# Patient Record
Sex: Female | Born: 1935
Health system: Southern US, Community
[De-identification: ages and names within clinical notes are randomized; demographics above are authoritative.]

## PROBLEM LIST (undated history)

## (undated) DIAGNOSIS — S76311A Strain of muscle, fascia and tendon of the posterior muscle group at thigh level, right thigh, initial encounter: Principal | ICD-10-CM

## (undated) DIAGNOSIS — F32A Depression, unspecified: Secondary | ICD-10-CM

## (undated) DIAGNOSIS — I739 Peripheral vascular disease, unspecified: Secondary | ICD-10-CM

## (undated) DIAGNOSIS — J449 Chronic obstructive pulmonary disease, unspecified: Secondary | ICD-10-CM

## (undated) DIAGNOSIS — I350 Nonrheumatic aortic (valve) stenosis: Secondary | ICD-10-CM

## (undated) DIAGNOSIS — F419 Anxiety disorder, unspecified: Secondary | ICD-10-CM

## (undated) DIAGNOSIS — F0280 Dementia in other diseases classified elsewhere without behavioral disturbance: Secondary | ICD-10-CM

## (undated) DIAGNOSIS — E039 Hypothyroidism, unspecified: Secondary | ICD-10-CM

## (undated) DIAGNOSIS — I10 Essential (primary) hypertension: Secondary | ICD-10-CM

## (undated) DIAGNOSIS — E669 Obesity, unspecified: Secondary | ICD-10-CM

## (undated) DIAGNOSIS — Z8719 Personal history of other diseases of the digestive system: Secondary | ICD-10-CM

## (undated) DIAGNOSIS — M1990 Unspecified osteoarthritis, unspecified site: Secondary | ICD-10-CM

## (undated) DIAGNOSIS — M549 Dorsalgia, unspecified: Secondary | ICD-10-CM

## (undated) DIAGNOSIS — G4730 Sleep apnea, unspecified: Secondary | ICD-10-CM

## (undated) DIAGNOSIS — J189 Pneumonia, unspecified organism: Secondary | ICD-10-CM

## (undated) DIAGNOSIS — G309 Alzheimer's disease, unspecified: Secondary | ICD-10-CM

## (undated) DIAGNOSIS — Z9981 Dependence on supplemental oxygen: Secondary | ICD-10-CM

## (undated) DIAGNOSIS — C50919 Malignant neoplasm of unspecified site of unspecified female breast: Secondary | ICD-10-CM

## (undated) DIAGNOSIS — K219 Gastro-esophageal reflux disease without esophagitis: Secondary | ICD-10-CM

## (undated) DIAGNOSIS — F329 Major depressive disorder, single episode, unspecified: Secondary | ICD-10-CM

## (undated) DIAGNOSIS — R591 Generalized enlarged lymph nodes: Secondary | ICD-10-CM

## (undated) HISTORY — DX: Dementia in other diseases classified elsewhere without behavioral disturbance: F02.80

## (undated) HISTORY — DX: Dorsalgia, unspecified: M54.9

## (undated) HISTORY — DX: Chronic obstructive pulmonary disease, unspecified: J44.9

## (undated) HISTORY — DX: Anxiety disorder, unspecified: F41.9

## (undated) HISTORY — PX: BREAST RECONSTRUCTION: SHX9

## (undated) HISTORY — DX: Strain of muscle, fascia and tendon of the posterior muscle group at thigh level, right thigh, initial encounter: S76.311A

## (undated) HISTORY — DX: Malignant neoplasm of unspecified site of unspecified female breast: C50.919

## (undated) HISTORY — PX: CARDIAC CATHETERIZATION: SHX172

## (undated) HISTORY — DX: Generalized enlarged lymph nodes: R59.1

## (undated) HISTORY — DX: Alzheimer's disease, unspecified: G30.9

## (undated) HISTORY — DX: Obesity, unspecified: E66.9

## (undated) HISTORY — DX: Hypothyroidism, unspecified: E03.9

## (undated) HISTORY — DX: Essential (primary) hypertension: I10

## (undated) HISTORY — DX: Nonrheumatic aortic (valve) stenosis: I35.0

## (undated) HISTORY — DX: Dependence on supplemental oxygen: Z99.81

## (undated) HISTORY — DX: Peripheral vascular disease, unspecified: I73.9

## (undated) HISTORY — PX: APPENDECTOMY: SHX54

---
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
## 1978-01-06 HISTORY — PX: MASTECTOMY: SHX3

## 2002-03-24 ENCOUNTER — Ambulatory Visit (HOSPITAL_COMMUNITY): Admission: RE | Admit: 2002-03-24 | Discharge: 2002-03-24 | Payer: Self-pay | Admitting: Gastroenterology

## 2002-03-24 ENCOUNTER — Encounter: Payer: Self-pay | Admitting: Gastroenterology

## 2002-09-14 ENCOUNTER — Other Ambulatory Visit: Admission: RE | Admit: 2002-09-14 | Discharge: 2002-09-14 | Payer: Self-pay | Admitting: Family Medicine

## 2003-03-06 ENCOUNTER — Ambulatory Visit (HOSPITAL_COMMUNITY): Admission: RE | Admit: 2003-03-06 | Discharge: 2003-03-06 | Payer: Self-pay | Admitting: Cardiology

## 2003-10-06 ENCOUNTER — Ambulatory Visit: Payer: Self-pay | Admitting: Psychiatry

## 2003-10-17 ENCOUNTER — Ambulatory Visit: Payer: Self-pay | Admitting: Psychiatry

## 2003-11-24 ENCOUNTER — Ambulatory Visit: Payer: Self-pay | Admitting: Psychiatry

## 2004-01-16 ENCOUNTER — Ambulatory Visit: Payer: Self-pay | Admitting: Psychiatry

## 2004-02-07 ENCOUNTER — Ambulatory Visit: Payer: Self-pay | Admitting: Gastroenterology

## 2004-02-20 ENCOUNTER — Ambulatory Visit: Payer: Self-pay | Admitting: Psychiatry

## 2005-04-10 ENCOUNTER — Ambulatory Visit: Payer: Self-pay | Admitting: Pulmonary Disease

## 2005-04-23 ENCOUNTER — Ambulatory Visit: Payer: Self-pay | Admitting: Internal Medicine

## 2005-05-07 ENCOUNTER — Ambulatory Visit: Payer: Self-pay | Admitting: Internal Medicine

## 2005-06-16 ENCOUNTER — Ambulatory Visit: Payer: Self-pay | Admitting: Internal Medicine

## 2005-07-30 ENCOUNTER — Ambulatory Visit: Payer: Self-pay | Admitting: Internal Medicine

## 2005-09-29 ENCOUNTER — Ambulatory Visit: Payer: Self-pay | Admitting: Internal Medicine

## 2007-04-29 ENCOUNTER — Ambulatory Visit: Payer: Self-pay | Admitting: Vascular Surgery

## 2007-05-18 ENCOUNTER — Telehealth (INDEPENDENT_AMBULATORY_CARE_PROVIDER_SITE_OTHER): Payer: Self-pay | Admitting: *Deleted

## 2007-05-25 ENCOUNTER — Encounter: Payer: Self-pay | Admitting: Internal Medicine

## 2007-05-25 DIAGNOSIS — J449 Chronic obstructive pulmonary disease, unspecified: Secondary | ICD-10-CM

## 2007-05-26 ENCOUNTER — Ambulatory Visit: Payer: Self-pay | Admitting: Internal Medicine

## 2007-06-06 DIAGNOSIS — K219 Gastro-esophageal reflux disease without esophagitis: Secondary | ICD-10-CM | POA: Insufficient documentation

## 2007-06-11 ENCOUNTER — Ambulatory Visit: Payer: Self-pay | Admitting: Internal Medicine

## 2007-06-18 ENCOUNTER — Telehealth: Payer: Self-pay | Admitting: Internal Medicine

## 2007-06-21 ENCOUNTER — Telehealth (INDEPENDENT_AMBULATORY_CARE_PROVIDER_SITE_OTHER): Payer: Self-pay | Admitting: *Deleted

## 2007-06-21 ENCOUNTER — Telehealth: Payer: Self-pay | Admitting: Internal Medicine

## 2007-06-25 ENCOUNTER — Ambulatory Visit: Payer: Self-pay | Admitting: Internal Medicine

## 2007-07-03 DIAGNOSIS — J310 Chronic rhinitis: Secondary | ICD-10-CM | POA: Insufficient documentation

## 2007-07-28 ENCOUNTER — Encounter: Payer: Self-pay | Admitting: Internal Medicine

## 2007-07-29 ENCOUNTER — Encounter: Payer: Self-pay | Admitting: Internal Medicine

## 2007-08-02 ENCOUNTER — Encounter: Payer: Self-pay | Admitting: Internal Medicine

## 2007-09-27 ENCOUNTER — Telehealth (INDEPENDENT_AMBULATORY_CARE_PROVIDER_SITE_OTHER): Payer: Self-pay | Admitting: *Deleted

## 2007-12-01 ENCOUNTER — Ambulatory Visit: Payer: Self-pay | Admitting: Internal Medicine

## 2007-12-06 ENCOUNTER — Telehealth (INDEPENDENT_AMBULATORY_CARE_PROVIDER_SITE_OTHER): Payer: Self-pay | Admitting: *Deleted

## 2008-01-11 ENCOUNTER — Encounter
Admission: RE | Admit: 2008-01-11 | Discharge: 2008-04-10 | Payer: Self-pay | Admitting: Physical Medicine and Rehabilitation

## 2008-03-01 ENCOUNTER — Telehealth (INDEPENDENT_AMBULATORY_CARE_PROVIDER_SITE_OTHER): Payer: Self-pay | Admitting: *Deleted

## 2008-09-25 ENCOUNTER — Encounter (INDEPENDENT_AMBULATORY_CARE_PROVIDER_SITE_OTHER): Payer: Self-pay | Admitting: *Deleted

## 2008-12-14 ENCOUNTER — Ambulatory Visit: Payer: Self-pay | Admitting: Internal Medicine

## 2009-05-18 ENCOUNTER — Telehealth (INDEPENDENT_AMBULATORY_CARE_PROVIDER_SITE_OTHER): Payer: Self-pay | Admitting: *Deleted

## 2009-10-02 ENCOUNTER — Ambulatory Visit: Payer: Self-pay | Admitting: Cardiology

## 2009-12-06 ENCOUNTER — Ambulatory Visit: Payer: Self-pay | Admitting: Cardiology

## 2009-12-14 ENCOUNTER — Ambulatory Visit: Payer: Self-pay | Admitting: Internal Medicine

## 2010-01-10 ENCOUNTER — Telehealth: Payer: Self-pay | Admitting: Internal Medicine

## 2010-02-05 NOTE — Progress Notes (Signed)
Summary: Schedule recall colonoscopy  Phone Note Outgoing Call Call back at [facility] Phone [phone]   Call placed by: Christie Nottingham CMA [facility]),  May 18, 2009 4:06 PM Call placed to: Patient Summary of Call: Called pt to schedule recall colonoscopy and pt states she is sick right now and will call back to schedule at another time. I tried to tell her of the importance of scheduling this procedure and during that sentence she cut me off to say I will just call back.  Initial call taken by: Christie Nottingham CMA [facility]),  May 18, 2009 4:08 PM

## 2010-02-07 NOTE — Progress Notes (Signed)
Summary: order for humidifier  Phone Note Call from Patient Call back at Home Phone [phone]   Caller: Patient Call For: young Summary of Call: Pt states she had a bad nosebleed last night and had to call 911, says the paramedics suggested she get a humidifier re: the air in her home is very dry, needs order called in to [place] in [place]. Initial call taken by: Darletta Moll,  January 10, 2010 9:37 AM  Follow-up for Phone Call        Pt c/o nosebleed last night approx 8pm and was evaluated and treated by EMS who applied cold compresses with "a lot" of pressure and stopped the bleeding. Was told her nose is too dry and suggested to have a humidifier added to O2. Pt states she has used "Roezit" in the past but has stopped because she didn't think she needed it. Pt uses [employer]. Please advise. Thanks. Zackery Barefoot CMA  January 10, 2010 10:23 AM   Additional Follow-up for Phone Call Additional follow up Details #1::        OK Additional Follow-up by: Waymon Budge MD,  January 10, 2010 5:32 PM    Additional Follow-up for Phone Call Additional follow up Details #2::    Order faxed to [facility] to add humidifier to concentrator. Called and spoke with pt and pt is aware of order being sent to DME. Rhonda Cobb  January 11, 2010 8:45 AM Pt advised to call us if she has any additional problems or concerns. Rhonda Cobb  January 11, 2010 8:45 AM   New/Updated Medications: * HUMIDIFIER FOR OXYGEN CONCENTRATOR  Prescriptions: HUMIDIFIER FOR OXYGEN CONCENTRATOR   #1 x prn   Entered and Authorized by:   Waymon Budge MD   Signed by:   Waymon Budge MD on 01/10/2010   Method used:   Print then Give to Patient   RxID:   [id number]

## 2010-02-07 NOTE — Assessment & Plan Note (Signed)
Summary: 12 months/apc   Primary Provider/Referring Provider:  Vernon Prey  CC:  Yearly follow up visit-COPD and allergic rhinitis. SOB with increased activity; able to go hours at a time without O2 if low activity levels..  History of Present Illness:  11/30/07- Allergic rhinitis, copd Severe obstructive disease with FEV1/FVC 0.41, response to bronchodilator. Sleeps much better now using O2 . Hx Pneumovax x 2. Discussed POS Allergy skin tests from last visit Had flu vax. She asks about recommending her for psychiatrist for anxiety- defer to primary.  December 14, 2008- Allergic rhinitis, COPD......................................Marland Kitchenhusband CXR last year showed severe COPD, probable PHTN as discussed. She paces herself. Has gone to therapist for her anxiety. She takes Xanax 0.5  for sleep and occasionally as needed. Occasionally chokes if she talks with eating. Keeps oxygen on 2L. She asks script labled "albuterol" from [place]. Does much better in current cool weather and can take oxygen off in home.  She asks "albuterol" inhlaler script to get from [place]. Had flu vax  December 14, 2009  Allergic rhinitis, COPD......................................Marland Kitchenhusband Nurse-CC: Yearly follow up visit-COPD and allergic rhinitis. SOB with increased activity; able to go hours at a time without O2 if low activity levels. Sometimes feels she can get off O2 briefly. Uses Xanax for anxiety- goes to a counselor. Coughs frequently- dry. Breathing doesn't wake her. O2 2 L cont and portable- depends on it. Discussed O2 use.  Discussed exercise.     Preventive Screening-Counseling & Management  Alcohol-Tobacco     Alcohol drinks/day: 2     Smoking Status: quit     Year Quit: 15 yrs ago     Pack years: smoked 40 yrs 1 pk a day  Current Medications (verified): 1)  Duoneb 0.5-2.5 (3) Mg/73ml  Soln (Ipratropium-Albuterol) .... Four Times A Day 2)  Proair Hfa 108 (90 Base) Mcg/act  Aers (Albuterol Sulfate)  .... 2 Puffs Four Times A Day As Needed 3)  Mucinex Dm 30-600 Mg  Tb12 (Dextromethorphan-Guaifenesin) .... As Needed 4)  Pulmicort 0.25 Mg/62ml  Susp (Budesonide) .... As Needed 5)  Ipratropium Bromide 0.03 %  Soln (Ipratropium Bromide) .... As Needed 6)  Ocean Nasal Spray 0.65 %  Soln (Saline) .... As Needed 7)  Tricor 145 Mg  Tabs (Fenofibrate) .... Take 1 By Mouth Once Daily 8)  Diovan 80 Mg  Tabs (Valsartan) .... Take 1 By Mouth Once Daily 9)  Zantac 75 75 Mg  Tabs (Ranitidine Hcl) .... Take 1 By Mouth Once Daily 10)  Hydrochlorothiazide 12.5 Mg  Tabs (Hydrochlorothiazide) .... Take 1 By Mouth Once Daily 11)  Wellbutrin Xl 150 Mg  Tb24 (Bupropion Hcl) .... Take 2 By Mouth Once Daily 12)  Alprazolam 0.5 Mg  Tabs (Alprazolam) .... Take 1 Every 8 Hours As Needed 13)  Temazepam 30 Mg  Caps (Temazepam) .... Take 1 At Bedtime As Needed 14)  Metoprolol Tartrate 25 Mg  Tabs (Metoprolol Tartrate) .... 1/2 Tab By Mouth Two Times A Day 15)  Tylenol Arthritis Pain 650 Mg  Tbcr (Acetaminophen) .... As Needed 16)  Nasonex 50 Mcg/act Susp (Mometasone Furoate) .Marland Kitchen.. 1-2 Sprays Each Nostril Daily 17)  Oxygen 2 L/m Contin&portable .... [facility] 18)  Albuterol Mdi .... 2 Puffs Four Times A Day Prn 19)  Aerochamber Spacer  Allergies (verified): No Known Drug Allergies  Past History:  Past Medical History: Last updated: 12/01/2007 C O P D G E R D Hx breast cancer Allergic Rhinitis- Skin test pos. 06/25/07 Hx pleurisy  Past Surgical History: Last updated: June 13, 2007 Bilateral mastectomy  Family History: Last updated: 06/13/2007 father died of lung cancer aunts died of female cancer brothers 69 and 34 have hypertension  Social History: Last updated: 06-13-07 married has 3 kids Patient states former smoker.   Risk Factors: Alcohol Use: 2 (12/14/2009)  Risk Factors: Smoking Status: quit (12/14/2009)  Review of Systems      See HPI       The patient complains of shortness  of breath with activity.  The patient denies shortness of breath at rest, productive cough, non-productive cough, coughing up blood, chest pain, irregular heartbeats, acid heartburn, indigestion, loss of appetite, weight change, abdominal pain, difficulty swallowing, sore throat, tooth/dental problems, headaches, nasal congestion/difficulty breathing through nose, sneezing, itching, ear ache, hand/feet swelling, rash, change in color of mucus, and fever.    Vital Signs:  Patient profile:   77 year old female Height:      66 inches Weight:      191.50 pounds BMI:     31.02 O2 Sat:      94 % on 2 L/min Pulse rate:   90 / minute BP sitting:   124 / 80  (left arm) Cuff size:   regular  Vitals Entered By: Reynaldo Minium CMA (December 14, 2009 1:46 PM)  O2 Flow:  2 L/min CC: Yearly follow up visit-COPD and allergic rhinitis. SOB with increased activity; able to go hours at a time without O2 if low activity levels.   Physical Exam  Additional Exam:  General: A/Ox3; pleasant and cooperative, NAD, oxygen- sat initially only 94%/ 2L on arrival. SKIN: no rash, lesions NODES: no lymphadenopathy HEENT: [place]/AT, EOM- WNL, Conjuctivae- clear, PERRLA, TM-WNL, Nose- clear, Throat- clear and wnl NECK: Supple w/ fair ROM, JVD- none, normal carotid impulses w/o bruits Thyroid- CHEST: Clear to P&A, very distant, some pursed lips HEART: RRR, no m/g/r heard ABDOMEN: Soft and nl WNU:UVOZ, nl pulses, no edema  NEURO: Grossly intact to observation      Impression & Recommendations:  Problem # 1:  C O P D (ICD-496) Severe COPD with oxygen dependent chronic respiratory failure. Overall she is not changing much. She is resistant to my discussion about  walking, saying she does enough walking in her mobile home. I tried to explain need for regular exercise and introduced availability of Pulmonary Rehab. Weight loss would help exercise tolerance considerably as reviewed.  Denies needs for med refills now.    Did get flu shot. Discussed her anxiolytic therapy. Discussed reflux control.  Problem # 2:  ALLERGIC RHINITIS (ICD-477.9) Noted some drying from oxygen but not significant atopic complaints now.  Her updated medication list for this problem includes:    Ipratropium Bromide 0.03 % Soln (Ipratropium bromide) .Marland Kitchen... As needed    Ocean Nasal Spray 0.65 % Soln (Saline) .Marland Kitchen... As needed    Nasonex 50 Mcg/act Susp (Mometasone furoate) .Marland Kitchen... 1-2 sprays each nostril daily  Medications Added to Medication List This Visit: 1)  Wellbutrin Xl 150 Mg Tb24 (Bupropion hcl) .... Take 2 by mouth once daily  Other Orders: Est. Patient Level IV (36644) T-2 View CXR (71020TC)  Patient Instructions: 1)  Please schedule a follow-up appointment in 1 year. Please call sooner as needed 2)  A chest x-ray has been recommended.  Your imaging study may require preauthorization.  3)  Consider a saline nasal spray for your nose as often as you need. You might also try the Breath Right nasal strips.

## 2010-02-13 ENCOUNTER — Telehealth (INDEPENDENT_AMBULATORY_CARE_PROVIDER_SITE_OTHER): Payer: Self-pay | Admitting: *Deleted

## 2010-02-19 ENCOUNTER — Encounter: Payer: Self-pay | Admitting: Internal Medicine

## 2010-02-21 ENCOUNTER — Encounter: Payer: Self-pay | Admitting: Internal Medicine

## 2010-02-21 NOTE — Progress Notes (Signed)
Summary: RX  Phone Note Call from Patient Call back at Home Phone [phone]   Caller: Patient Call For: YOUNG Summary of Call: WANTS RX FOR BREATHING EQUIPMENT WRITTEN [place] [facility] ISNT WORKING WELL  Initial call taken by: Lacinda Axon,  February 13, 2010 12:59 PM  Follow-up for Phone Call        Spoke with patient-she is not happy with [facility] and request we send order to [facility] for her O2, Humidifer, and concentator-she uses [facility] now for her [employer] supplies. Pt understands I am sending an order to our [facility] for this and they or [facility] Allegra may call her about this. Thanks.Reynaldo Minium CMA  February 13, 2010 2:42 PM

## 2010-03-14 NOTE — Miscellaneous (Signed)
Summary: CPAP Set Up/[facility]  CPAP Set Up/[facility]   Imported By: Sherian Rein 03/05/2010 15:09:20  _____________________________________________________________________  External Attachment:    Type:   Image     Comment:   External Document

## 2010-03-14 NOTE — Procedures (Signed)
Summary: Oximetry/[facility]  Oximetry/[facility]   Imported By: Sherian Rein 03/05/2010 15:08:33  _____________________________________________________________________  External Attachment:    Type:   Image     Comment:   External Document

## 2010-05-24 NOTE — Letter (Signed)
December 19, 2007    [facility]  [address]  [place], [place] [address]   RE:  [name], [name]  MRN:  [id number]  /  DOB:  07-May-1928   Dear Milford Cage:   At the request of my patient, Katie Bryant, I am referring her to you  for counseling to help with chronic anxiety.  I will follow her for  allergy and breathing problems.  Thank you for your assistance.    Sincerely,      Clinton D. Maple Hudson, MD, Tonny Bollman, FACP  Electronically Signed    CDY/MedQ  DD: 12/19/2007  DT: 12/19/2007  Job #: [id number]   CC:    Allen Derry

## 2010-05-24 NOTE — H&P (Signed)
NAMEPAILYN, BELLEVUE                        ACCOUNT NO.:  [id number]   MEDICAL RECORD NO.:  [id number]                   PATIENT TYPE:  OIB   LOCATION:                                       FACILITY:  [facility]   PHYSICIAN:  Colleen Can. Deborah Chalk, M.D.            DATE OF BIRTH:  11/02/1928   DATE OF ADMISSION:  03/06/2003  DATE OF DISCHARGE:                                HISTORY & PHYSICAL   CHIEF COMPLAINT:  Previous chest pain with abnormal Cardiolite study.   HISTORY OF PRESENT ILLNESS:  Mrs. Katie Bryant is a 77 year old female who has  history of COPD, prior history of breast cancer, who presented for atypical  chest pain.  She had been seen at her primary care's office by the nurse  practitioner earlier this month.  She was referred for Cardiolite study  which was performed on February 24, 2003.  With this, she demonstrated  reduced ejection fraction and reduced inferior perfusion.  The EKG showed  left axis deviation.  There was some degree of artifact that was expected  due to the patient's weight.  However, in light of her symptoms, she is now  referred for elective cardiac catheterization.   She had an episode approximately two weeks ago of chest tightness that was  associated with sweating.  Ten days ago, she had an episode of presyncope  while using the toilet.  She notes that she does have shortness of breath  and diaphoresis with walking.  She does have COPD yet has had not tobacco  use for the past 12 years.   PAST MEDICAL HISTORY:  1. COPD.  2. Hypertension, currently on no medicines.  3. History of breast cancer with subsequent left and right mastectomy in     1980 with subsequent breast reconstruction surgery in 1982.  4. Status post hysterectomy in 1956.  5  Tonsils and adenoids removed in 1942 and previous nasal surgery dating  back to 1935   She has no reports of high cholesterol or diabetes.   ALLERGIES:  Includes some ANTIBIOTICS.   CURRENT MEDICATIONS:  1.  Nexium 40 mg a day.  2. Prozac 20 mg b.i.d.  3. Xanax 0.5 t.i.d.  4. Restoril at bedtime.  5. Reglan 1/2 tablet daily.  6. Meclizine p.r.n.  7. Flonase twice a day.  8. Claritin D daily.  9. Nebulizers and albuterol.   FAMILY HISTORY:  Parents are both deceased.  Father died at 67 with lung  cancer  Mother died at 38 with TB.   SOCIAL HISTORY:  She is a Futures trader. She has had no tobacco products for the  past 12 years.  She does have occasional use of wine.  She lives at home  with her husband.   REVIEW OF SYSTEMS:  Basically as noted above and otherwise unremarkable.   PHYSICAL EXAMINATION:  GENERAL:  Pleasant, obese white female in no acute  distress.  VITAL  SIGNS: Weight 194 pounds, blood pressure 140/70 sitting, 130/80  standing, heart rate 108, respirations 18.  She is afebrile.  SKIN:  Warm and dry.  Color is unremarkable.  HEENT:  Basically unremarkable.  LUNGS:  Somewhat coarse with prolonged expiratory phase.  CARDIAC:  Regular rhythm.  She is tachycardic.  ABDOMEN:  Obese.  She has soft positive bowel sounds, nontender.  EXTREMITIES:  Without edema.  NEUROLOGIC:  Intact.  There are no gross focal deficits.   LABORATORY DATA:  Pertinent labs are pending.   OVERALL IMPRESSION:  1. Previous episode of chest tightness.  2. Abnormal Cardiolite study.  3. Chronic obstructive pulmonary disease with past tobacco abuse.  4. Hypertension.  5. Anxiety/depression.   PLAN:  We will proceed on with elective cardiac catheterization.  The  procedure has been reviewed in full detail, and she is willing to proceed on  Monday, March 06, 2003.      Sharlee Blew, N.P.                     Colleen Can. Deborah Chalk, M.D.    Hadley Pen  D:  03/03/2003  T:  03/03/2003  Job:  [id number]   cc:   Paulita Cradle, N.P.

## 2010-05-24 NOTE — Assessment & Plan Note (Signed)
[facility]                               PULMONARY OFFICE NOTE   NAME:Katie Bryant, Katie Bryant                      MRN:          [id number]  DATE:09/29/2005                            DOB:          [birth year]    PULMONARY/FINAL FOLLOWUP OFFICE VISIT   HISTORY:  A 77 year old white female remote smoker with COPD status post  smoking cessation in 1992, in for final followup stating that she is  satisfied with the use of DuoNeb 4 times a day in terms of activities of  daily living and absence of exacerbations or nocturnal symptoms.  She rarely  needs Albuterol in any form any more.   The patient's main complaint is one of throat and head congestion but has  actually no excess sputum production either in the morning or any other  time.   For full inventory of medications, please see face sheet dated September 29, 2005, which is correct as listed and distinguishes all of her maintenance  versus p.r.n.  She did confuse, however, ranitidine, which I have listed as  a nightly, as a p.r.n. acid indigestion which was specifically not the  intention of the drug, which was more directed at treating LPR, which may  not cause symptomatic heartburn and yet cause the exact same symptoms that  she is having presently.   PHYSICAL EXAMINATION:  GENERAL:  She is a chronically ill-appearing  ambulatory obese white female in no acute distress.  VITAL SIGNS: She has stable vital signs.  HEENT:  Unremarkable.  Pharynx clear.  LUNGS:  The lung fields reveal diminished breath sounds bilaterally.  No  wheezing.  CARDIAC:  Regular rate and rhythm without murmur, gallop, or rub.  ABDOMEN:  Soft and obese.  Benign.  EXTREMITIES:  Without calf tenderness, cyanosis, clubbing, or edema.   PFTs reviewed with the patient from July 25 and indicate an FEV1 of only  45%, predicted ratio of 34%, and diffusing capacity of  43%, with no  definite improvement after bronchodilators.   IMPRESSION:  Chronic obstructive pulmonary disease with minimum  reversibility and decreased diffusing capacity consistent with an emphysema  component.  Her main symptoms have been typically upper airway in nature and  may either represent poorly controlled rhinitis, reflux, or both.  I  reviewed with her optimum use of ranitidine (She should take b.i.d. dosing  if she has any upper airway symptoms of congestion with the option of  switching to proton pump inhibitors if worsens.)  The other option would be  to perform a sinus CT scan at some point, but note that based on the fact  that she previously had evidence of hiatal hernia with stricture in 2002 I  suspect reflux and obesity are contributing to her upper respiratory  symptoms more than primary sinus disease.   In either case, pulmonary followup can be p.r.n. at Dr. Kathi Der discretion.                                   Casimiro Needle  Denice Paradise, MD, FCCP   MBW/MedQ  DD:  09/29/2005  DT:  10/01/2005  Job #:  [id number]   cc:   Ernestina Penna, M.D.

## 2010-05-24 NOTE — Cardiovascular Report (Signed)
[name], Katie Bryant                         ACCOUNT NO.:  [id number]   MEDICAL RECORD NO.:  [id number]                   PATIENT TYPE:  OIB   LOCATION:  2869                                 FACILITY:  [facility]   PHYSICIAN:  Colleen Can. Deborah Chalk, M.D.            DATE OF BIRTH:  20-Jun-1928   DATE OF PROCEDURE:  DATE OF DISCHARGE:                              CARDIAC CATHETERIZATION   HISTORY:  [name] presents with chest pain, dizziness, and abnormal EKG.  Adenosine Cardiolite study was performed which showed reduced inferior  perfusion with reduced ejection fraction but with no evidence of ischemia.  Because of the abnormality and the concern regarding chest pain, she is  referred for catheterization.   PROCEDURE:  Left heart catheterization with selective coronary angiography,  left ventricular angiography, and Angio-Seal.   TYPE AND SITE OF ENTRY:  Percutaneous, right femoral artery.   CATHETERS:  6-French 4-curved Judkins right and left coronary catheters; 6-  French pigtail ventriculographic catheter.   CONTRAST MATERIAL:  Omnipaque.   MEDICATIONS GIVEN PRIOR TO THE PROCEDURE:  Valium 10 mg p.o.   MEDICATIONS GIVEN DURING THE PROCEDURE:  Versed 2 mg IV, Ancef 1 g IV.   COMMENTS:  The patient tolerated the procedure well.   HEMODYNAMIC DATA:  1. The aortic pressure was 163/84.  2. The LV pressure was 166/14-15.  3. There was no aortic valve gradient noted on pullback.   ANGIOGRAPHIC DATA:  The left ventricular angiogram was performed in the RAO  position.  Overall cardiac size and silhouette are normal.  Global ejection  fraction is 55-60%.  Regional wall motion is normal.   Coronary arteries:  The coronary arteries arise and distribute normally.   1. Right coronary artery:  The right coronary artery is a dominant vessel.     It bifurcates early and supplies 2 branches to the posterolateral wall.     It has only minor irregularities and otherwise is normal.  2. Left  main coronary artery:  Normal.  3. Left circumflex:  The left circumflex is a moderately large vessel that     is free of significant obstructive disease.  There is a large obtuse     marginal branch.  4. Left anterior descending:  The left anterior descending is a reasonably     long tortuous vessel.  It is normal.   OVERALL IMPRESSION:  1. Normal left ventricular function.  2. Normal coronary arteries.   DISCUSSION:  It is felt that [name] has basically normal coronary arteries  with very minimal irregularity, if at all.  She will be managed with  modification of risk factors as well as modification of her other medical  problems.  Colleen Can. Deborah Chalk, M.D.    SNT/MEDQ  D:  03/06/2003  T:  03/07/2003  Job:  [id number]   cc:   Birdena Jubilee, M.D.

## 2010-05-24 NOTE — Assessment & Plan Note (Signed)
[facility]                               PULMONARY OFFICE NOTE   NAME:Chavana, AALIVIA MCGRAW                      MRN:          [id number]  DATE:07/30/2005                            DOB:          10/08/1928    I saw Ms. Mcdonald in followup today for COPD.  She says that her symptoms are  under reasonable control at the present time.  She does not have much as far  as coughing or wheezing.  She says that she uses her DuoNeb nebulizer four  times a day, and this seems to control the symptoms.  She also uses Mucinex  once or twice a day, and this seems to help as well.  She occasionally will  also use her Pro-Air HFA inhaler but does not feel the need to use this on a  regular basis.  She says that her sinus symptoms are under reasonable  control at the present time.   PHYSICAL EXAMINATION:  VITAL SIGNS:  She is 194 pounds.  Temp is 97.9, blood  pressure 130/70, heart rate 79.  Oxygen saturation is 91% on room air.  HEENT:  There is no sinus tenderness, no nasal discharge, no oral lesions,  no lymphadenopathy.  HEART:  S1, S2, regular rhythm.  CHEST:  She has decreased breath sounds bilaterally with no wheezing or  rales.  ABDOMEN:  Soft, nontender, positive bowel sounds.  EXTREMITIES:  There is no edema, cyanosis or clubbing.   Pulmonary function tests from earlier in the day showed an FEV1, FEV ratio  of 34% with FEV1 of 0.89 L which is 45% unpredicted and an FEVC of 2.65 L  which was 93% predicted, and a significant bronchodilator response based on  the [facility].  Total lung capacity was 4.86 L which was 98% of predicted.  Residual volume was 2.21 L which was 108% of predicted, and the DLCL was 9.4  which was 43% predicted.   IMPRESSION:  Severe chronic obstructive pulmonary disease.  At this time,  her symptoms seem to be under reasonable control.  I would continue her on  her regimen of DuoNeb inhalers and Mucinex and then monitor for a symptom  status, and then she is due to have followup with Dr. Sherene Sires in one to two  months.                                   Coralyn Helling, MD   VS/MedQ  DD:  07/31/2005  DT:  07/31/2005  Job #:  [id number]

## 2010-06-26 ENCOUNTER — Encounter: Payer: Self-pay | Admitting: Cardiovascular Disease

## 2010-07-02 ENCOUNTER — Other Ambulatory Visit: Payer: Self-pay | Admitting: *Deleted

## 2010-07-02 ENCOUNTER — Telehealth: Payer: Self-pay | Admitting: Cardiovascular Disease

## 2010-07-02 NOTE — Telephone Encounter (Signed)
Pt instructed to fast before appointment tomorrow.

## 2010-07-02 NOTE — Telephone Encounter (Signed)
FORMER DR TENNANT PT, ASK TO SW KELLY BUT NOW ASSIGNED TO DR NAHSER. FORWARDING THIS MESG TO KELLY AND JODETTE. PT HAS APPT TOMORROW AND SHE SAID SHE NEEDS TO KNOW WHAT TO DO??

## 2010-07-03 ENCOUNTER — Other Ambulatory Visit (INDEPENDENT_AMBULATORY_CARE_PROVIDER_SITE_OTHER): Payer: Medicare Other | Admitting: *Deleted

## 2010-07-03 ENCOUNTER — Ambulatory Visit (INDEPENDENT_AMBULATORY_CARE_PROVIDER_SITE_OTHER): Payer: Medicare Other | Admitting: Cardiovascular Disease

## 2010-07-03 ENCOUNTER — Encounter: Payer: Self-pay | Admitting: Cardiovascular Disease

## 2010-07-03 VITALS — BP 130/85 | HR 72 | Ht 66.0 in | Wt 186.6 lb

## 2010-07-03 DIAGNOSIS — E785 Hyperlipidemia, unspecified: Secondary | ICD-10-CM

## 2010-07-03 DIAGNOSIS — Z79899 Other long term (current) drug therapy: Secondary | ICD-10-CM

## 2010-07-03 DIAGNOSIS — I359 Nonrheumatic aortic valve disorder, unspecified: Secondary | ICD-10-CM

## 2010-07-03 DIAGNOSIS — I350 Nonrheumatic aortic (valve) stenosis: Secondary | ICD-10-CM

## 2010-07-03 LAB — CBC WITH DIFFERENTIAL/PLATELET
Basophils Absolute: 0.1 10*3/uL (ref 0.0–0.1)
Basophils Relative: 0.7 % (ref 0.0–3.0)
HCT: 38.5 % (ref 36.0–46.0)
Hemoglobin: 12.9 g/dL (ref 12.0–15.0)
Lymphocytes Relative: 31.3 % (ref 12.0–46.0)
Lymphs Abs: 2.8 10*3/uL (ref 0.7–4.0)
Monocytes Relative: 7.3 % (ref 3.0–12.0)
Neutro Abs: 5.2 10*3/uL (ref 1.4–7.7)
RBC: 4.21 Mil/uL (ref 3.87–5.11)
RDW: 12.9 % (ref 11.5–14.6)

## 2010-07-03 LAB — BASIC METABOLIC PANEL
BUN: 18 mg/dL (ref 6–23)
Chloride: 102 mEq/L (ref 96–112)
GFR: 81.6 mL/min (ref >60.00)
Glucose, Bld: 89 mg/dL (ref 70–99)
Potassium: 4.1 mEq/L (ref 3.5–5.1)
Sodium: 141 mEq/L (ref 135–145)

## 2010-07-03 LAB — LIPID PANEL
HDL: 58.5 mg/dL (ref >39.00)
LDL Cholesterol: 105 mg/dL — ABNORMAL HIGH (ref 0–99)
VLDL: 28.8 mg/dL (ref 0.0–40.0)

## 2010-07-03 LAB — HEPATIC FUNCTION PANEL
ALT: 21 U/L (ref 0–35)
Bilirubin, Direct: 0.1 mg/dL (ref 0.0–0.3)
Total Bilirubin: 0.4 mg/dL (ref 0.3–1.2)
Total Protein: 7 g/dL (ref 6.0–8.3)

## 2010-07-03 LAB — TSH: TSH: 5.45 u[IU]/mL (ref 0.35–5.50)

## 2010-07-03 NOTE — Progress Notes (Signed)
Katie Bryant Date of Birth  1928-09-12 [facility] / [facility] [name]     [address] [place], [place]  [address] [phone]  Fax  [phone]  History of Present Illness:  77 yo patient with severe COPD, And mild aortic stenosis. She has chronic shortness of breath. She does not exercise because of her severe shortness of breath. Her biggest complaint today is that she's having some nose bleeding which she thinks is due to the nasal cannula from her oxygen. She's tried cutting the nasal cannula tips and that seems to help but has not eliminated all of the bleeding.She apparently has seen an ENT doctor and was told that she did not have any sinus problems and they did not find anything to cauterize.  Current Outpatient Prescriptions on File Prior to Visit  Medication Sig Dispense Refill  . Acetaminophen (TYLENOL ARTHRITIS PAIN PO) Take by mouth as needed.        . ALPRAZolam (XANAX) 0.5 MG tablet Take 0.5 mg by mouth at bedtime as needed.        Marland Kitchen buPROPion (WELLBUTRIN SR) 150 MG 12 hr tablet Take 150 mg by mouth daily.        . fenofibrate micronized (LOFIBRA) 134 MG capsule Take 134 mg by mouth daily before breakfast.        . guaiFENesin (MUCINEX) 600 MG 12 hr tablet Take 1,200 mg by mouth as needed.        . meclizine (ANTIVERT) 25 MG tablet Take 25 mg by mouth 3 (three) times daily as needed.        . metoprolol tartrate (LOPRESSOR) 25 MG tablet Take 12.5 mg by mouth 2 (two) times daily.        . Nebulizer MISC by Does not apply route 4 (four) times daily.        . simvastatin (ZOCOR) 20 MG tablet Take 20 mg by mouth at bedtime.        . valsartan-hydrochlorothiazide (DIOVAN-HCT) 80-12.5 MG per tablet Take 1 tablet by mouth daily.          Allergies  Allergen Reactions  . Other     STEROIDS    Past Medical History  Diagnosis Date  . COPD (chronic obstructive pulmonary disease)   . Oxygen dependent   . Aortic stenosis, mild   . Obesity    . Hypertension   . Anxiety   . SOB (shortness of breath) on exertion   . Back pain   . Headache   . Breast cancer     Past Surgical History  Procedure Date  . Cardiac catheterization     EF 55-60%  . Tonsillectomy and adenoidectomy 1942  . Mastectomy 1980  . Breast reconstruction     History  Smoking status  . Former Smoker  . Quit date: 01/06/1989  Smokeless tobacco  . Not on file    History  Alcohol Use No    Family History  Problem Relation Age of Onset  . Tuberculosis Mother   . Lung cancer Father     Reviw of Systems:  Reviewed in the HPI.  All other systems are negative.  Physical Exam: BP 140/80  Pulse 72  Ht 5\' 6"  (1.676 m)  Wt 186 lb 9.6 oz (84.641 kg)  BMI 30.12 kg/m2 The patient is alert and oriented x 3.  The mood and affect are normal.   Skin: warm and dry.  Color is normal.    HEENT:  the sclera are nonicteric.  The mucous membranes are moist.  The carotids are 2+ without bruits.  There is no thyromegaly.  There is no JVD.    Lungs: clear.  The chest wall is non tender.    Heart: regular rate with a normal S1 and S2.  There are no murmurs, gallops, or rubs. The PMI is not displaced.     Abdomin: good bowel sounds.  There is no guarding or rebound.  There is no hepatosplenomegaly or tenderness.  There are no masses.   Extremities:  no clubbing, cyanosis, or edema.  The legs are without rashes.  The distal pulses are intact.   Neuro:  Cranial nerves II - XII are intact.  Motor and sensory functions are intact.    The gait is normal.  Assessment / Plan:

## 2010-07-03 NOTE — Assessment & Plan Note (Signed)
Her aortic stenosis is very mild. Her last echocardiogram was in 2009 which revealed a mean gradient of 720 mmHg. Her murmur is very soft and I do not think that she has significant aortic stenosis at this time. I'll see her again in 6 months.

## 2010-07-04 ENCOUNTER — Telehealth: Payer: Self-pay | Admitting: Internal Medicine

## 2010-07-04 NOTE — Telephone Encounter (Signed)
Pt c/o intermittent nosebleeds x 6 months, last one 2 days ago lasting 30 minutes. Pt states she uses ice and pressure. Only uses nasal gel and nasal saline as needed. Uses humidifier 24/7 in bedroom. Please advise. Thank you.  [employer] Katie Bryant Allergies: Oral steroids

## 2010-07-05 NOTE — Telephone Encounter (Signed)
Per CY-suggest she try Afrin over the weekend.

## 2010-07-05 NOTE — Telephone Encounter (Signed)
Pt aware. Jennifer Castillo, CMA  

## 2010-07-09 NOTE — Progress Notes (Signed)
Called with results.

## 2010-08-07 ENCOUNTER — Telehealth: Payer: Self-pay | Admitting: Cardiovascular Disease

## 2010-08-07 NOTE — Telephone Encounter (Signed)
Pt wants copy of last bw sent to pcp dr Christell Constant

## 2010-08-07 NOTE — Telephone Encounter (Signed)
Notes forwarded /

## 2010-08-20 ENCOUNTER — Other Ambulatory Visit: Payer: Self-pay

## 2010-10-09 ENCOUNTER — Other Ambulatory Visit: Payer: Self-pay | Admitting: Cardiology

## 2010-11-08 ENCOUNTER — Telehealth: Payer: Self-pay | Admitting: Cardiovascular Disease

## 2010-11-08 NOTE — Telephone Encounter (Signed)
Pt set up fu appt, does she need lab work?

## 2010-11-08 NOTE — Telephone Encounter (Signed)
Called wanting to know if she should have lab work when comes in in Dec. Reviewed last OV note and Dr. Elease Hashimoto does not mention having labs. Advised to call back closer to app to see if he wants labs. Dr. Elease Hashimoto not in office today.

## 2010-11-09 ENCOUNTER — Other Ambulatory Visit: Payer: Self-pay | Admitting: Cardiology

## 2010-12-13 ENCOUNTER — Encounter: Payer: Self-pay | Admitting: Internal Medicine

## 2010-12-16 ENCOUNTER — Ambulatory Visit (INDEPENDENT_AMBULATORY_CARE_PROVIDER_SITE_OTHER): Payer: Medicare Other | Admitting: Internal Medicine

## 2010-12-16 ENCOUNTER — Encounter: Payer: Self-pay | Admitting: Internal Medicine

## 2010-12-16 VITALS — BP 122/78 | HR 88 | Ht 66.0 in | Wt 190.8 lb

## 2010-12-16 DIAGNOSIS — J449 Chronic obstructive pulmonary disease, unspecified: Secondary | ICD-10-CM

## 2010-12-16 DIAGNOSIS — I350 Nonrheumatic aortic (valve) stenosis: Secondary | ICD-10-CM

## 2010-12-16 DIAGNOSIS — I359 Nonrheumatic aortic valve disorder, unspecified: Secondary | ICD-10-CM

## 2010-12-16 NOTE — Progress Notes (Signed)
12/16/10- 67 yoF former smoker followed for allergic rhinitis, COPD LOV-12/14/09       Husband here Has had flu shot. Now for the past 3 weeks has had cough, sneeze. Treated at her primary of this with Mucinex and prednisone. Never really cleared and has developed hacking cough treated with more steroids. Cough is becoming more productive, white. She denies fever, chest pain, purulent sputum, nodes or swelling. Uses her nebulizer regularly on most days. Husband is concerned about where the silicone went says she used to have C. cup implants which are now flat. He worries the silicone might have migrated into her lungs to cause shortness of breath. We reviewed chest x-ray showing no active process or obvious foreign material. She declined offer of a CT scan and says it she's not feeling the silicone and not worried about it.   ROS-see HPI Constitutional:   No-   weight loss, night sweats, fevers, chills, fatigue, lassitude. HEENT:   No-  headaches, difficulty swallowing, tooth/dental problems, sore throat,       No-  sneezing, itching, ear ache, nasal congestion, post nasal drip,  CV:  No-   chest pain, orthopnea, PND, swelling in lower extremities, anasarca,                                  dizziness, palpitations Resp: + shortness of breath with exertion or at rest.              +   productive cough,  No non-productive cough,  No- coughing up of blood.              No-   change in color of mucus.  No- wheezing.   Skin: No-   rash or lesions. GI:  No-   heartburn, indigestion, abdominal pain, nausea, vomiting, diarrhea,                 change in bowel habits, loss of appetite GU: No-   dysuria, change in color of urine, no urgency or frequency.  No- flank pain. MS:  No-   joint pain or swelling.  No- decreased range of motion.  No- back pain. Neuro-     nothing unusual Psych:  No- change in mood or affect. No depression or anxiety.  No memory loss.  OBJ General- Alert, Oriented,  Affect-appropriate, Distress- none acute; O2 2L/M Skin- rash-none, lesions- none, excoriation- none Lymphadenopathy- none Head- atraumatic            Eyes- Gross vision intact, PERRLA, conjunctivae clear secretions            Ears- Hearing, canals-normal            Nose- Clear, no-Septal dev, mucus, polyps, erosion, perforation             Throat- Mallampati II , mucosa clear , drainage- none, tonsils- atrophic Neck- flexible , trachea midline, no stridor , thyroid nl, carotid no bruit Chest - symmetrical excursion , unlabored           Heart/CV- RRR , 1/6 systolic murmur , no gallop  , no rub, nl s1 s2                           - JVD- none , edema- none, stasis changes- none, varices- none           Lung- distant, unlabored,  wheeze- none, cough- none , dullness-none, rub- none           Chest wall-  Abd- tender-no, distended-no, bowel sounds-present, HSM- no Br/ Gen/ Rectal- Not done, not indicated Extrem- cyanosis- none, clubbing, none, atrophy- none, strength- nl Neuro- grossly intact to observation

## 2010-12-16 NOTE — Patient Instructions (Signed)
Please call as needed 

## 2010-12-17 ENCOUNTER — Ambulatory Visit: Payer: Medicare Other | Admitting: Cardiovascular Disease

## 2010-12-18 NOTE — Assessment & Plan Note (Signed)
Clinically this has been stable. She is not having chest pain or prolonged increase in dyspnea separate from her bronchitis.Marland Kitchen

## 2010-12-18 NOTE — Assessment & Plan Note (Signed)
Recent acute bronchitic exacerbation of her COPD. This probably began as a viral upper respiratory infection with tracheobronchitis. She remains oxygen dependent. Her husband is more concerned that she is about disappearance of her silicone breast implants. There is nothing apparent on latest chest x-ray and she does not want CT. Most likely the capsules ruptured  And the silicone was silicone was dispersed.

## 2010-12-21 ENCOUNTER — Encounter (HOSPITAL_COMMUNITY): Payer: Self-pay | Admitting: Adult Health

## 2010-12-21 ENCOUNTER — Inpatient Hospital Stay (HOSPITAL_COMMUNITY)
Admission: EM | Admit: 2010-12-21 | Discharge: 2010-12-24 | DRG: 192 | Disposition: A | Discharge status: Home / Self Care | Payer: Medicare Other | Attending: Family Medicine | Admitting: Family Medicine

## 2010-12-21 ENCOUNTER — Emergency Department (HOSPITAL_COMMUNITY): Payer: Medicare Other

## 2010-12-21 DIAGNOSIS — D72829 Elevated white blood cell count, unspecified: Secondary | ICD-10-CM

## 2010-12-21 DIAGNOSIS — I350 Nonrheumatic aortic (valve) stenosis: Secondary | ICD-10-CM | POA: Insufficient documentation

## 2010-12-21 DIAGNOSIS — R0602 Shortness of breath: Secondary | ICD-10-CM

## 2010-12-21 DIAGNOSIS — J309 Allergic rhinitis, unspecified: Secondary | ICD-10-CM

## 2010-12-21 DIAGNOSIS — J189 Pneumonia, unspecified organism: Secondary | ICD-10-CM

## 2010-12-21 DIAGNOSIS — R0902 Hypoxemia: Secondary | ICD-10-CM

## 2010-12-21 DIAGNOSIS — E669 Obesity, unspecified: Secondary | ICD-10-CM

## 2010-12-21 DIAGNOSIS — J310 Chronic rhinitis: Secondary | ICD-10-CM | POA: Insufficient documentation

## 2010-12-21 DIAGNOSIS — J441 Chronic obstructive pulmonary disease with (acute) exacerbation: Principal | ICD-10-CM | POA: Diagnosis present

## 2010-12-21 DIAGNOSIS — G4700 Insomnia, unspecified: Secondary | ICD-10-CM

## 2010-12-21 DIAGNOSIS — J449 Chronic obstructive pulmonary disease, unspecified: Secondary | ICD-10-CM | POA: Insufficient documentation

## 2010-12-21 DIAGNOSIS — K219 Gastro-esophageal reflux disease without esophagitis: Secondary | ICD-10-CM | POA: Insufficient documentation

## 2010-12-21 HISTORY — DX: Unspecified osteoarthritis, unspecified site: M19.90

## 2010-12-21 HISTORY — DX: Sleep apnea, unspecified: G47.30

## 2010-12-21 HISTORY — DX: Major depressive disorder, single episode, unspecified: F32.9

## 2010-12-21 HISTORY — DX: Depression, unspecified: F32.A

## 2010-12-21 HISTORY — DX: Personal history of other diseases of the digestive system: Z87.19

## 2010-12-21 HISTORY — DX: Pneumonia, unspecified organism: J18.9

## 2010-12-21 HISTORY — DX: Gastro-esophageal reflux disease without esophagitis: K21.9

## 2010-12-21 LAB — DIFFERENTIAL
Basophils Relative: 1 % (ref 0–1)
Lymphocytes Relative: 8 % — ABNORMAL LOW (ref 12–46)
Monocytes Relative: 8 % (ref 3–12)
Neutro Abs: 14.6 10*3/uL — ABNORMAL HIGH (ref 1.7–7.7)

## 2010-12-21 LAB — BASIC METABOLIC PANEL
CO2: 36 mEq/L — ABNORMAL HIGH (ref 19–32)
Chloride: 94 mEq/L — ABNORMAL LOW (ref 96–112)
GFR calc non Af Amer: 78 mL/min — ABNORMAL LOW (ref >90)
Glucose, Bld: 100 mg/dL — ABNORMAL HIGH (ref 70–99)
Potassium: 3.7 mEq/L (ref 3.5–5.1)
Sodium: 136 mEq/L (ref 135–145)

## 2010-12-21 LAB — CBC
HCT: 40.1 % (ref 36.0–46.0)
Hemoglobin: 12.8 g/dL (ref 12.0–15.0)
WBC: 17.6 10*3/uL — ABNORMAL HIGH (ref 4.0–10.5)

## 2010-12-21 MED ORDER — BUPROPION HCL ER (SR) 150 MG PO TB12
150.0000 mg | ORAL_TABLET | Freq: Every day | ORAL | Status: DC
Start: 2010-12-21 — End: 2010-12-24
  Administered 2010-12-21: 150 mg via ORAL
  Filled 2010-12-21 (×5): qty 1

## 2010-12-21 MED ORDER — ALBUTEROL SULFATE (5 MG/ML) 0.5% IN NEBU
2.5000 mg | INHALATION_SOLUTION | Freq: Once | RESPIRATORY_TRACT | Status: AC
  Administered 2010-12-21: 2.5 mg via RESPIRATORY_TRACT

## 2010-12-21 MED ORDER — MECLIZINE HCL 25 MG PO TABS
25.0000 mg | ORAL_TABLET | Freq: Three times a day (TID) | ORAL | Status: DC | PRN
  Filled 2010-12-21: qty 1

## 2010-12-21 MED ORDER — ALBUTEROL SULFATE (5 MG/ML) 0.5% IN NEBU
2.5000 mg | INHALATION_SOLUTION | RESPIRATORY_TRACT | Status: DC | PRN
  Administered 2010-12-24 (×2): 2.5 mg via RESPIRATORY_TRACT
  Filled 2010-12-21 (×3): qty 0.5

## 2010-12-21 MED ORDER — GUAIFENESIN ER 600 MG PO TB12
600.0000 mg | ORAL_TABLET | Freq: Two times a day (BID) | ORAL | Status: DC
  Administered 2010-12-21 – 2010-12-24 (×6): 600 mg via ORAL
  Filled 2010-12-21 (×9): qty 1

## 2010-12-21 MED ORDER — SODIUM CHLORIDE 0.9 % IJ SOLN
3.0000 mL | Freq: Two times a day (BID) | INTRAMUSCULAR | Status: DC
  Administered 2010-12-21 – 2010-12-23 (×3): 3 mL via INTRAVENOUS

## 2010-12-21 MED ORDER — METOPROLOL TARTRATE 12.5 MG HALF TABLET
12.5000 mg | ORAL_TABLET | Freq: Two times a day (BID) | ORAL | Status: DC
  Administered 2010-12-21 – 2010-12-24 (×6): 12.5 mg via ORAL
  Filled 2010-12-21 (×9): qty 1

## 2010-12-21 MED ORDER — ACETAMINOPHEN 325 MG PO TABS
650.0000 mg | ORAL_TABLET | Freq: Four times a day (QID) | ORAL | Status: DC | PRN
  Administered 2010-12-22 – 2010-12-24 (×4): 650 mg via ORAL
  Filled 2010-12-21 (×4): qty 2

## 2010-12-21 MED ORDER — ALPRAZOLAM 0.5 MG PO TABS
0.5000 mg | ORAL_TABLET | Freq: Every evening | ORAL | Status: DC | PRN
  Administered 2010-12-21 – 2010-12-23 (×4): 0.5 mg via ORAL
  Filled 2010-12-21 (×3): qty 1

## 2010-12-21 MED ORDER — SIMVASTATIN 20 MG PO TABS
20.0000 mg | ORAL_TABLET | Freq: Every day | ORAL | Status: DC
  Administered 2010-12-22 – 2010-12-23 (×2): 20 mg via ORAL
  Filled 2010-12-21 (×4): qty 1

## 2010-12-21 MED ORDER — SODIUM CHLORIDE 0.9 % IV SOLN: 250.0000 mL | INTRAVENOUS | Status: DC | PRN

## 2010-12-21 MED ORDER — METHYLPREDNISOLONE SODIUM SUCC 125 MG IJ SOLR
60.0000 mg | Freq: Two times a day (BID) | INTRAMUSCULAR | Status: DC
  Administered 2010-12-21: 60 mg via INTRAVENOUS
  Administered 2010-12-22: 05:00:00 via INTRAVENOUS
  Filled 2010-12-21 (×6): qty 0.96

## 2010-12-21 MED ORDER — DEXTROSE 5 % IV SOLN
1.0000 g | Freq: Once | INTRAVENOUS | Status: AC
  Administered 2010-12-21: 1 g via INTRAVENOUS
  Filled 2010-12-21: qty 10

## 2010-12-21 MED ORDER — SODIUM CHLORIDE 0.9 % IJ SOLN: 3.0000 mL | INTRAMUSCULAR | Status: DC | PRN

## 2010-12-21 MED ORDER — ACETAMINOPHEN 650 MG RE SUPP: 650.0000 mg | Freq: Four times a day (QID) | RECTAL | Status: DC | PRN

## 2010-12-21 MED ORDER — ALBUTEROL SULFATE (5 MG/ML) 0.5% IN NEBU
2.5000 mg | INHALATION_SOLUTION | Freq: Four times a day (QID) | RESPIRATORY_TRACT | Status: DC
  Administered 2010-12-22 – 2010-12-24 (×8): 2.5 mg via RESPIRATORY_TRACT
  Filled 2010-12-21 (×9): qty 0.5

## 2010-12-21 MED ORDER — IPRATROPIUM BROMIDE 0.02 % IN SOLN
0.5000 mg | RESPIRATORY_TRACT | Status: DC | PRN
  Administered 2010-12-24 (×2): 0.5 mg via RESPIRATORY_TRACT
  Filled 2010-12-21 (×2): qty 2.5

## 2010-12-21 MED ORDER — IPRATROPIUM BROMIDE 0.02 % IN SOLN
0.5000 mg | Freq: Once | RESPIRATORY_TRACT | Status: AC
  Administered 2010-12-21: 0.5 mg via RESPIRATORY_TRACT
  Filled 2010-12-21 (×2): qty 2.5

## 2010-12-21 MED ORDER — BIOTENE DRY MOUTH MT LIQD
15.0000 mL | Freq: Two times a day (BID) | OROMUCOSAL | Status: DC
  Administered 2010-12-21 – 2010-12-23 (×3): 15 mL via OROMUCOSAL

## 2010-12-21 MED ORDER — METHYLPREDNISOLONE SODIUM SUCC 125 MG IJ SOLR
125.0000 mg | Freq: Once | INTRAMUSCULAR | Status: AC
  Administered 2010-12-21: 125 mg via INTRAVENOUS
  Filled 2010-12-21: qty 2

## 2010-12-21 MED ORDER — IPRATROPIUM-ALBUTEROL 0.5-2.5 (3) MG/3ML IN SOLN: 3.0000 mL | RESPIRATORY_TRACT | Status: DC | PRN

## 2010-12-21 MED ORDER — ENOXAPARIN SODIUM 40 MG/0.4ML ~~LOC~~ SOLN
40.0000 mg | SUBCUTANEOUS | Status: DC
  Administered 2010-12-21: 40 mg via SUBCUTANEOUS
  Filled 2010-12-21: qty 0.4

## 2010-12-21 MED ORDER — SODIUM CHLORIDE 0.9 % IJ SOLN
3.0000 mL | Freq: Two times a day (BID) | INTRAMUSCULAR | Status: DC
  Administered 2010-12-22: 3 mL via INTRAVENOUS

## 2010-12-21 MED ORDER — LEVOFLOXACIN IN D5W 750 MG/150ML IV SOLN
750.0000 mg | INTRAVENOUS | Status: DC
  Administered 2010-12-21 – 2010-12-23 (×3): 750 mg via INTRAVENOUS
  Filled 2010-12-21 (×4): qty 150

## 2010-12-21 MED ORDER — SODIUM CHLORIDE 0.9 % IV SOLN
Freq: Once | INTRAVENOUS | Status: AC
  Administered 2010-12-21: 20 mL/h via INTRAVENOUS

## 2010-12-21 MED ORDER — CHLORHEXIDINE GLUCONATE 0.12 % MT SOLN
15.0000 mL | Freq: Two times a day (BID) | OROMUCOSAL | Status: DC
  Administered 2010-12-21 – 2010-12-24 (×5): 15 mL via OROMUCOSAL
  Filled 2010-12-21 (×9): qty 15

## 2010-12-21 MED ORDER — IPRATROPIUM BROMIDE 0.02 % IN SOLN
0.5000 mg | Freq: Four times a day (QID) | RESPIRATORY_TRACT | Status: DC
  Administered 2010-12-22 – 2010-12-24 (×9): 0.5 mg via RESPIRATORY_TRACT
  Filled 2010-12-21 (×10): qty 2.5

## 2010-12-21 NOTE — ED Notes (Signed)
Presents with SOB and mild confusion. CBG 119, Sats 60% on RA for EMS. Upon arrival sats 92% on 3L, HR 110. Pt is alert and oriented, answering all questions appropriately. History of COPD, uses O2 at home. Productive thick, green spuptum cough.

## 2010-12-21 NOTE — ED Provider Notes (Addendum)
History     CSN: [id number] Arrival date & time: 12/21/2010 10:47 AM   First MD Initiated Contact with Patient 12/21/10 1049      Chief Complaint  Patient presents with  . Shortness of Breath    (Consider location/radiation/quality/duration/timing/severity/associated sxs/prior treatment) Patient is a 77 y.o. female presenting with shortness of breath. The history is provided by the patient.  Shortness of Breath  Associated symptoms include shortness of breath.  She was treated by her pulmonologist with a steroid injection and a Z-Pak 5 days ago because of a productive cough. Since then, her coughing has gotten worse and she is and some thick green sputum. Since then she has had increasing dyspnea and family has noted that she has been confused. She has not had any nausea, vomiting, or diarrhea. She has not had any arthralgias or myalgias. She denies fever, chills, sweats. EMS noted she was hypoxic on arrival with oxygen saturation of 60% which came up to 92% with supplemental oxygen. Symptoms are described as severe. Nothing has made it any better, nothing has made it any worse. She has continued her routine home nebulizer treatments with no benefit.  Past Medical History  Diagnosis Date  . COPD (chronic obstructive pulmonary disease)   . Oxygen dependent   . Aortic stenosis, mild   . Obesity   . Hypertension   . Anxiety   . SOB (shortness of breath) on exertion   . Back pain   . Headache   . Breast cancer     Past Surgical History  Procedure Date  . Cardiac catheterization     EF 55-60%  . Tonsillectomy and adenoidectomy 1942  . Mastectomy 1980  . Breast reconstruction     Family History  Problem Relation Age of Onset  . Tuberculosis Mother   . Lung cancer Father     History  Substance Use Topics  . Smoking status: Former Smoker    Quit date: 01/06/1989  . Smokeless tobacco: Not on file  . Alcohol Use: No    OB History    Grav Para Term Preterm Abortions TAB  SAB Ect Mult Living                  Review of Systems  Respiratory: Positive for shortness of breath.   All other systems reviewed and are negative.    Allergies  Other  Home Medications   Current Outpatient Rx  Name Route Sig Dispense Refill  . TYLENOL ARTHRITIS PAIN PO Oral Take by mouth as needed.      . ALPRAZOLAM 0.5 MG PO TABS Oral Take 0.5 mg by mouth at bedtime as needed.      . BUDESONIDE 0.5 MG/2ML IN SUSP      . BUPROPION HCL ER (SR) 150 MG PO TB12 Oral Take 150 mg by mouth daily.      Marland Kitchen DIOVAN HCT 80-12.5 MG PO TABS  TAKE ONE TABLET BY MOUTH ONE TIME DAILY 30 tablet 3  . FENOFIBRATE MICRONIZED 134 MG PO CAPS  TAKE ONE CAPSULE BY MOUTH ONE TIME DAILY 30 capsule 3  . GUAIFENESIN ER 600 MG PO TB12 Oral Take 1,200 mg by mouth as needed.      . IPRATROPIUM-ALBUTEROL 0.5-2.5 (3) MG/3ML IN SOLN      . MECLIZINE HCL 25 MG PO TABS Oral Take 25 mg by mouth 3 (three) times daily as needed.      Marland Kitchen METOPROLOL TARTRATE 25 MG PO TABS  TAKE ONE HALF  TABLET BY MOUTH TWICE DAILY 90 tablet 3    Further refills by Dr. Elease Hashimoto  . NEBULIZER MISC Does not apply by Does not apply route 4 (four) times daily.      Marland Kitchen SIMVASTATIN 20 MG PO TABS  TAKE  ONE TABLET BY MOUTH NIGHTLY AT BEDTIME 30 tablet 3    BP 150/56  Pulse 101  Temp(Src) 97.7 F (36.5 C) (Oral)  Resp 28  SpO2 96%  Physical Exam  Nursing note and vitals reviewed. -year-old female who appears moderately dyspneic but in no acute distress. Vital signs significant for mild heart he (rate of 101, and moderate tachypnea with respiratory rate of 28. She's not using accessory muscles of respiration. Head is normocephalic and atraumatic. PERRLA, EOMI. Oropharynx is clear. Neck is supple without adenopathy, JVD, or bruit. Tach is nontender. Lungs have diffuse wheezing with diminished airflow and prolonged exhalation phase. No rales or rhonchi are heard. Heart has regular rate and rhythm with no murmur. There is no chest wall tenderness.  Abdomen is soft, flat, nontender without masses or hepatosplenomegaly. Extremities have trace to 1+ edema, no cyanosis. Skin is warm and dry without rash. Neurologic: She is oriented x3, cranial nerves are intact, there are no focal motor or sensory deficits.   ED Course  Procedures (including critical care time)   Labs Reviewed  CBC  DIFFERENTIAL  BASIC METABOLIC PANEL   No results found. Results for orders placed during the hospital encounter of 12/21/10  CBC      Component Value Range   WBC 17.6 (*) 4.0 - 10.5 (K/uL)   RBC 4.29  3.87 - 5.11 (MIL/uL)   Hemoglobin 12.8  12.0 - 15.0 (g/dL)   HCT 16.1  09.6 - 04.5 (%)   MCV 93.5  78.0 - 100.0 (fL)   MCH 29.8  26.0 - 34.0 (pg)   MCHC 31.9  30.0 - 36.0 (g/dL)   RDW 40.9  81.1 - 91.4 (%)   Platelets 222  150 - 400 (K/uL)  DIFFERENTIAL      Component Value Range   Neutrophils Relative 83 (*) 43 - 77 (%)   Lymphocytes Relative 8 (*) 12 - 46 (%)   Monocytes Relative 8  3 - 12 (%)   Eosinophils Relative 0  0 - 5 (%)   Basophils Relative 1  0 - 1 (%)   Neutro Abs 14.6 (*) 1.7 - 7.7 (K/uL)   Lymphs Abs 1.4  0.7 - 4.0 (K/uL)   Monocytes Absolute 1.4 (*) 0.1 - 1.0 (K/uL)   Eosinophils Absolute 0.0  0.0 - 0.7 (K/uL)   Basophils Absolute 0.2 (*) 0.0 - 0.1 (K/uL)   WBC Morphology MILD LEFT SHIFT (1-5% METAS, OCC MYELO, OCC BANDS)     Smear Review PLATELET CLUMPS NOTED ON SMEAR    BASIC METABOLIC PANEL      Component Value Range   Sodium 136  135 - 145 (mEq/L)   Potassium 3.7  3.5 - 5.1 (mEq/L)   Chloride 94 (*) 96 - 112 (mEq/L)   CO2 36 (*) 19 - 32 (mEq/L)   Glucose, Bld 100 (*) 70 - 99 (mg/dL)   BUN 19  6 - 23 (mg/dL)   Creatinine, Ser 7.82  0.50 - 1.10 (mg/dL)   Calcium 95.6  8.4 - 10.5 (mg/dL)   GFR calc non Af Amer 78 (*) >90 (mL/min)   GFR calc Af Amer 90 (*) >90 (mL/min)   Dg Chest Portable 1 View  12/21/2010  *RADIOLOGY REPORT*  Clinical Data: COPD.  Smoker.  Emphysema.  Cough and congestion.  PORTABLE CHEST - 1 VIEW   Comparison: 12/14/2009  Findings: Heart size appears normal.  There is moderate pulmonary venous congestion.  No airspace disease is noted within the left midlung and left base compatible with pneumonia.  IMPRESSION:  1.  Left lung pneumonia. 2.  Moderate pulmonary venous congestion.  Original Report Authenticated By: Rosealee Albee, M.D.      No diagnosis found.  She is given an albuterol with Atrovent treatment with some subjective improvement. Auscultation, she continues to have significant wheezing. She is now disoriented to time thinking that it is Thanksgiving. She was given IV Solu-Medrol. X-rays show significant left lower lobe pneumonia. This has occurred while taking azithromycin. She is given Rocephin intravenously, but decision regarding complementary antibiotic is deferred to the admitting physician and has not been started on intravenous azithromycin because she is oriented treatment with that medication. Case is discussed with Dr. Cena Benton of [facility] who agrees to admit the patient.   CRITICAL CARE Performed by: Dione Booze   Total critical care time: 35 minutes  Critical care time was exclusive of separately billable procedures and treating other patients.  Critical care was necessary to treat or prevent imminent or life-threatening deterioration.  Critical care was time spent personally by me on the following activities: development of treatment plan with patient and/or surrogate as well as nursing, discussions with consultants, evaluation of patient's response to treatment, examination of patient, obtaining history from patient or surrogate, ordering and performing treatments and interventions, ordering and review of laboratory studies, ordering and review of radiographic studies, pulse oximetry and re-evaluation of patient's condition.  MDM  Exacerbation of COPD with acute bronchitis and possible pneumonia. Her old records were reviewed and she has been followed by  Dr. Elease Hashimoto mild aortic stenosis, and Dr. Maple Hudson for severe COPD.        Dione Booze, MD 12/21/10 1158  Dione Booze, MD 12/21/10 [phone]

## 2010-12-21 NOTE — ED Notes (Signed)
ZOX:[id number]<AV> Expected date:12/21/10<BR> Expected time:10:40 AM<BR> Means of arrival:Ambulance<BR> Comments:<BR> SOB

## 2010-12-21 NOTE — H&P (Signed)
Katie Bryant is an 77 y.o. female.   Chief Complaint: Progressive SOB, increase in cough, and sputum production HPI: The patient is a [name] that presents secondary to 1 wk worsening complaint of worsening dyspnea, increase in cough and sputum production.  Was given azithromycin as an outpatient but states that she didn't improve on this medication and ultimately decided to come to the ED today for further evaluation.  At the ED patient was given solumedrol, rocephin, and supplemental oxygen.  Currently feels some improvement but is still SOB.  Chest xray revealed a left pneumonia.  Pt is followed as an outpatient by Dr. Jetty Duhamel from [facility].  Past Medical History  Diagnosis Date  . COPD (chronic obstructive pulmonary disease)   . Oxygen dependent   . Aortic stenosis, mild   . Obesity   . Hypertension   . Anxiety   . SOB (shortness of breath) on exertion   . Back pain   . Headache   . Breast cancer     Past Surgical History  Procedure Date  . Cardiac catheterization     EF 55-60%  . Tonsillectomy and adenoidectomy 1942  . Mastectomy 1980  . Breast reconstruction     Family History  Problem Relation Age of Onset  . Tuberculosis Mother   . Lung cancer Father    Social History:  reports that she quit smoking about 21 years ago. She does not have any smokeless tobacco history on file. She reports that she does not drink alcohol or use illicit drugs.  Allergies:  Allergies  Allergen Reactions  . Other     STEROIDS, pt staes she blows up and gains weight    Medications Prior to Admission  Medication Dose Route Frequency Provider Last Rate Last Dose  . 0.9 %  sodium chloride infusion   Intravenous Once Dione Booze, MD 20 mL/hr at 12/21/10 1115 20 mL/hr at 12/21/10 1115  . albuterol (PROVENTIL) (5 MG/ML) 0.5% nebulizer solution 2.5 mg  2.5 mg Nebulization Once Dione Booze, MD   2.5 mg at 12/21/10 1126  . cefTRIAXone (ROCEPHIN) 1 g in dextrose 5 % 50 mL  IVPB  1 g Intravenous Once Dione Booze, MD   1 g at 12/21/10 1200  . ipratropium (ATROVENT) nebulizer solution 0.5 mg  0.5 mg Nebulization Once Dione Booze, MD   0.5 mg at 12/21/10 1126  . methylPREDNISolone sodium succinate (SOLU-MEDROL) 125 MG injection 125 mg  125 mg Intravenous Once Dione Booze, MD   125 mg at 12/21/10 1121   Medications Prior to Admission  Medication Sig Dispense Refill  . Acetaminophen (TYLENOL ARTHRITIS PAIN PO) Take 1 tablet by mouth as needed. For pain      . ALPRAZolam (XANAX) 0.5 MG tablet Take 0.5 mg by mouth at bedtime as needed. For anxiety/sleep      . azithromycin (ZITHROMAX) 250 MG tablet Take 250 mg by mouth daily. Take 2 tablets first day and 1 tablet every day for next four days.       . budesonide (PULMICORT) 0.5 MG/2ML nebulizer solution Take 0.5 mg by nebulization daily.       Marland Kitchen buPROPion (WELLBUTRIN SR) 150 MG 12 hr tablet Take 150 mg by mouth daily.       . fenofibrate micronized (LOFIBRA) 134 MG capsule        . guaiFENesin (MUCINEX) 600 MG 12 hr tablet Take 1,200 mg by mouth as needed. For congestion      .  ipratropium-albuterol (DUONEB) 0.5-2.5 (3) MG/3ML SOLN Inhale 3 mLs into the lungs every 4 (four) hours as needed. For shortness of breath      . meclizine (ANTIVERT) 25 MG tablet Take 25 mg by mouth 3 (three) times daily as needed. For dizziness      . metoprolol tartrate (LOPRESSOR) 25 MG tablet TAKE ONE HALF TABLET BY MOUTH TWICE DAILY  90 tablet  3  . simvastatin (ZOCOR) 20 MG tablet        . valsartan-hydrochlorothiazide (DIOVAN HCT) 80-12.5 MG per tablet          Results for orders placed during the hospital encounter of 12/21/10 (from the past 48 hour(s))  CBC     Status: Abnormal   Collection Time   12/21/10 11:10 AM      Component Value Range Comment   WBC 17.6 (*) 4.0 - 10.5 (K/uL)    RBC 4.29  3.87 - 5.11 (MIL/uL)    Hemoglobin 12.8  12.0 - 15.0 (g/dL)    HCT 16.1  09.6 - 04.5 (%)    MCV 93.5  78.0 - 100.0 (fL)    MCH 29.8  26.0  - 34.0 (pg)    MCHC 31.9  30.0 - 36.0 (g/dL)    RDW 40.9  81.1 - 91.4 (%)    Platelets 222  150 - 400 (K/uL)   DIFFERENTIAL     Status: Abnormal   Collection Time   12/21/10 11:10 AM      Component Value Range Comment   Neutrophils Relative 83 (*) 43 - 77 (%)    Lymphocytes Relative 8 (*) 12 - 46 (%)    Monocytes Relative 8  3 - 12 (%)    Eosinophils Relative 0  0 - 5 (%)    Basophils Relative 1  0 - 1 (%)    Neutro Abs 14.6 (*) 1.7 - 7.7 (K/uL)    Lymphs Abs 1.4  0.7 - 4.0 (K/uL)    Monocytes Absolute 1.4 (*) 0.1 - 1.0 (K/uL)    Eosinophils Absolute 0.0  0.0 - 0.7 (K/uL)    Basophils Absolute 0.2 (*) 0.0 - 0.1 (K/uL)    WBC Morphology MILD LEFT SHIFT (1-5% METAS, OCC MYELO, OCC BANDS)   VACUOLATED NEUTROPHILS   Smear Review PLATELET CLUMPS NOTED ON SMEAR     BASIC METABOLIC PANEL     Status: Abnormal   Collection Time   12/21/10 11:10 AM      Component Value Range Comment   Sodium 136  135 - 145 (mEq/L)    Potassium 3.7  3.5 - 5.1 (mEq/L)    Chloride 94 (*) 96 - 112 (mEq/L)    CO2 36 (*) 19 - 32 (mEq/L)    Glucose, Bld 100 (*) 70 - 99 (mg/dL)    BUN 19  6 - 23 (mg/dL)    Creatinine, Ser 7.82  0.50 - 1.10 (mg/dL)    Calcium 95.6  8.4 - 10.5 (mg/dL)    GFR calc non Af Amer 78 (*) >90 (mL/min)    GFR calc Af Amer 90 (*) >90 (mL/min)    Dg Chest Portable 1 View  12/21/2010  *RADIOLOGY REPORT*  Clinical Data: COPD.  Smoker.  Emphysema.  Cough and congestion.  PORTABLE CHEST - 1 VIEW  Comparison: 12/14/2009  Findings: Heart size appears normal.  There is moderate pulmonary venous congestion.  No airspace disease is noted within the left midlung and left base compatible with pneumonia.  IMPRESSION:  1.  Left  lung pneumonia. 2.  Moderate pulmonary venous congestion.  Original Report Authenticated By: Rosealee Albee, M.D.    Review of Systems  Constitutional: Positive for fever and chills. Negative for weight loss, malaise/fatigue and diaphoresis.  HENT: Positive for congestion.  Negative for nosebleeds.   Eyes: Negative.   Respiratory: Positive for cough, sputum production, shortness of breath and wheezing. Negative for hemoptysis and stridor.   Cardiovascular: Negative.   Gastrointestinal: Negative.   Genitourinary: Negative.   Musculoskeletal: Negative.   Skin: Negative.   Neurological: Negative.  Negative for weakness.  Endo/Heme/Allergies: Negative.   Psychiatric/Behavioral: Negative.     Blood pressure 150/56, pulse 101, temperature 97.7 F (36.5 C), temperature source Oral, resp. rate 28, SpO2 92.00%. Physical Exam  Constitutional: She is oriented to person, place, and time. She appears well-developed and well-nourished.  HENT:  Head: Normocephalic and atraumatic.  Eyes: Conjunctivae and EOM are normal. Pupils are equal, round, and reactive to light.  Neck: Normal range of motion. Neck supple.  Cardiovascular: Normal rate, regular rhythm and normal heart sounds.   Respiratory: Effort normal. No respiratory distress. She has wheezes. She has rales. She exhibits no tenderness.  GI: Soft. Bowel sounds are normal.  Musculoskeletal: Normal range of motion.  Neurological: She is alert and oriented to person, place, and time.  Skin: Skin is warm and dry.  Psychiatric: She has a normal mood and affect. Her behavior is normal.     Assessment/Plan 1) COPD Exacerbation:  At this point will plan on placing the patient on solumedrol, levaquin, nebs, and supplemental O2.  Will follow WBC counts.  2) Aortic Stenosis:  Stable at this juncture  3) Gerd:  Place on protonix  4) Hypertension:  Will observe for now.  Pt is asymptomatic.  Should blood pressures stay elevated will plan on tighter control.  5) DVT prophylaxis.  Penny Pia 12/21/2010, 1:56 PM

## 2010-12-21 NOTE — ED Notes (Signed)
Report called to [facility] on [address]

## 2010-12-22 DIAGNOSIS — G4700 Insomnia, unspecified: Secondary | ICD-10-CM

## 2010-12-22 LAB — CBC
Hemoglobin: 12.3 g/dL (ref 12.0–15.0)
MCH: 29.5 pg (ref 26.0–34.0)
MCHC: 31.6 g/dL (ref 30.0–36.0)
Platelets: 246 10*3/uL (ref 150–400)
RDW: 13.3 % (ref 11.5–15.5)

## 2010-12-22 MED ORDER — ZOLPIDEM TARTRATE 5 MG PO TABS
5.0000 mg | ORAL_TABLET | Freq: Every evening | ORAL | Status: DC | PRN
  Administered 2010-12-22 – 2010-12-24 (×2): 5 mg via ORAL
  Filled 2010-12-22 (×2): qty 1

## 2010-12-22 MED ORDER — METHYLPREDNISOLONE SODIUM SUCC 125 MG IJ SOLR
60.0000 mg | Freq: Every day | INTRAMUSCULAR | Status: DC
  Administered 2010-12-23: 60 mg via INTRAVENOUS
  Filled 2010-12-22: qty 0.96

## 2010-12-22 NOTE — Progress Notes (Signed)
Subjective: Pt seen and examined states that she feels much better.  Still having productive cough but it has decreased in frequency.  Denies any fever or chills  Objective: Filed Vitals:   12/22/10 0842 12/22/10 0944 12/22/10 1301 12/22/10 1415  BP:  181/75  149/95  Pulse: 100 110  99  Temp:    98.4 F (36.9 C)  TempSrc:    Oral  Resp: 21   20  Height:      Weight:      SpO2: 96%  95% 93%   Weight change:   Intake/Output Summary (Last 24 hours) at 12/22/10 1637 Last data filed at 12/22/10 1057  Gross per 24 hour  Intake   1080 ml  Output    800 ml  Net    280 ml    General: Alert, awake, oriented x3, in no acute distress.  HEENT: No bruits, no goiter.  Heart: Regular rate and rhythm, without murmurs, rubs, gallops.  Lungs: Prolonged exp. Phase.  No wheeze  Abdomen: Soft, nontender, nondistended, positive bowel sounds.  Neuro: Grossly intact, nonfocal.   Lab Results:  Basename 12/21/10 1110  NA 136  K 3.7  CL 94*  CO2 36*  GLUCOSE 100*  BUN 19  CREATININE 0.76  CALCIUM 10.1  MG --  PHOS --   No results found for this basename: AST:2,ALT:2,ALKPHOS:2,BILITOT:2,PROT:2,ALBUMIN:2 in the last 72 hours No results found for this basename: LIPASE:2,AMYLASE:2 in the last 72 hours  Basename 12/22/10 0446 12/21/10 1110  WBC 10.5 17.6*  NEUTROABS -- 14.6*  HGB 12.3 12.8  HCT 38.9 40.1  MCV 93.3 93.5  PLT 246 222   No results found for this basename: CKTOTAL:3,CKMB:3,CKMBINDEX:3,TROPONINI:3 in the last 72 hours No components found with this basename: POCBNP:3 No results found for this basename: DDIMER:2 in the last 72 hours No results found for this basename: HGBA1C:2 in the last 72 hours No results found for this basename: CHOL:2,HDL:2,LDLCALC:2,TRIG:2,CHOLHDL:2,LDLDIRECT:2 in the last 72 hours No results found for this basename: TSH,T4TOTAL,FREET3,T3FREE,THYROIDAB in the last 72 hours No results found for this basename:  VITAMINB12:2,FOLATE:2,FERRITIN:2,TIBC:2,IRON:2,RETICCTPCT:2 in the last 72 hours  Micro Results: No results found for this or any previous visit (from the past 240 hour(s)).  Studies/Results: Dg Chest Portable 1 View  12/21/2010  *RADIOLOGY REPORT*  Clinical Data: COPD.  Smoker.  Emphysema.  Cough and congestion.  PORTABLE CHEST - 1 VIEW  Comparison: 12/14/2009  Findings: Heart size appears normal.  There is moderate pulmonary venous congestion.  No airspace disease is noted within the left midlung and left base compatible with pneumonia.  IMPRESSION:  1.  Left lung pneumonia. 2.  Moderate pulmonary venous congestion.  Original Report Authenticated By: Rosealee Albee, M.D.    Medications: I have reviewed the patient's current medications.  COPD with acute exacerbation:  Patient was diagnosed with flu as outpatient.  Likely the cause of the exacerbation.  Will plan on continuing current regimen and decreasing amount of IV prednisone.  Tomorrow will discuss switching to po prednisone.  Aortic Stenosis:  Stable  Dyspnea: Secondary to #1  Gerd: Stable, no complaints currently  Insomnia: Will plan on giving ambien tonight and reassessing tomorrow.    LOS: 1 day   Penny Pia M.D.  [facility] 12/22/2010, 4:37 PM

## 2010-12-23 DIAGNOSIS — D72829 Elevated white blood cell count, unspecified: Secondary | ICD-10-CM

## 2010-12-23 LAB — CBC
MCV: 91.8 fL (ref 78.0–100.0)
Platelets: 306 10*3/uL (ref 150–400)
RBC: 4.75 MIL/uL (ref 3.87–5.11)
RDW: 13.3 % (ref 11.5–15.5)
WBC: 19.5 10*3/uL — ABNORMAL HIGH (ref 4.0–10.5)

## 2010-12-23 MED ORDER — FLUTICASONE PROPIONATE 50 MCG/ACT NA SUSP
1.0000 | Freq: Three times a day (TID) | NASAL | Status: DC | PRN
  Administered 2010-12-23 – 2010-12-24 (×2): 1 via NASAL
  Filled 2010-12-23: qty 16

## 2010-12-23 MED ORDER — METHYLPREDNISOLONE 32 MG PO TABS
48.0000 mg | ORAL_TABLET | Freq: Every day | ORAL | Status: DC
  Administered 2010-12-24: 48 mg via ORAL
  Filled 2010-12-23 (×2): qty 1

## 2010-12-23 MED ORDER — ALPRAZOLAM 0.5 MG PO TABS
0.5000 mg | ORAL_TABLET | Freq: Once | ORAL | Status: DC
  Filled 2010-12-23: qty 1

## 2010-12-23 MED ORDER — METHYLPREDNISOLONE 32 MG PO TABS: 64.0000 mg | ORAL_TABLET | Freq: Every day | ORAL | Status: DC

## 2010-12-23 NOTE — Progress Notes (Signed)
Subjective: Pt states that she feels about the same today.  Having less productive cough today.  Denies any fever or chills.  Pt is requesting to go home today.    Objective: Filed Vitals:   12/22/10 2157 12/23/10 0232 12/23/10 0500 12/23/10 0738  BP: 153/86  136/81   Pulse: 108  112   Temp: 98.3 F (36.8 C)  98.1 F (36.7 C)   TempSrc: Oral  Oral   Resp: 20  18   Height:      Weight:      SpO2: 95% 94% 94% 94%   Weight change:   Intake/Output Summary (Last 24 hours) at 12/23/10 1245 Last data filed at 12/23/10 0945  Gross per 24 hour  Intake    240 ml  Output   1400 ml  Net  -1160 ml    General: Alert, awake, oriented x3, in no acute distress.  HEENT: No bruits, no goiter.  Heart: Regular rate and rhythm, without murmurs, rubs, gallops.  Lungs: Prolonged expiratory phase. Mild exp wheezes  Abdomen: Soft, nontender, nondistended, positive bowel sounds.  Neuro: Grossly intact, nonfocal.   Lab Results:  Basename 12/21/10 1110  NA 136  K 3.7  CL 94*  CO2 36*  GLUCOSE 100*  BUN 19  CREATININE 0.76  CALCIUM 10.1  MG --  PHOS --   No results found for this basename: AST:2,ALT:2,ALKPHOS:2,BILITOT:2,PROT:2,ALBUMIN:2 in the last 72 hours No results found for this basename: LIPASE:2,AMYLASE:2 in the last 72 hours  Basename 12/23/10 0531 12/22/10 0446 12/21/10 1110  WBC 19.5* 10.5 --  NEUTROABS -- -- 14.6*  HGB 14.0 12.3 --  HCT 43.6 38.9 --  MCV 91.8 93.3 --  PLT 306 246 --   No results found for this basename: CKTOTAL:3,CKMB:3,CKMBINDEX:3,TROPONINI:3 in the last 72 hours No components found with this basename: POCBNP:3 No results found for this basename: DDIMER:2 in the last 72 hours No results found for this basename: HGBA1C:2 in the last 72 hours No results found for this basename: CHOL:2,HDL:2,LDLCALC:2,TRIG:2,CHOLHDL:2,LDLDIRECT:2 in the last 72 hours No results found for this basename: TSH,T4TOTAL,FREET3,T3FREE,THYROIDAB in the last 72 hours No results  found for this basename: VITAMINB12:2,FOLATE:2,FERRITIN:2,TIBC:2,IRON:2,RETICCTPCT:2 in the last 72 hours  Micro Results: No results found for this or any previous visit (from the past 240 hour(s)).  Studies/Results: No results found.  Medications: I have reviewed the patient's current medications.   Patient Active Hospital Problem List: C O P D (05/25/2007) Will plan on switching patient over to oral medrol will d/c iv steroids.  Pt will continue nebs and antibiotics for now.  She is on supplemental oxygen at home.  ALLERGIC RHINITIS (07/03/2007)   Stable  Leukocytosis:  Pt is showing clinical improvement with current regimen.  Suspect that this elevation in [facility] is likely related to recent increase in prednisone use.  Will recheck WBC tomorrow.  DYSPNEA (06/11/2007)  Improving patient is back on home regimen of supplemental O2  G E R D (06/06/2007) stable  Aortic stenosis (07/03/2010)   Stable   Insomnia (12/22/2010)   Katie Bryant      LOS: 2 days   Penny Pia M.D.  [facility] 12/23/2010, 12:45 PM

## 2010-12-24 LAB — CBC
HCT: 42.6 % (ref 36.0–46.0)
Hemoglobin: 13.9 g/dL (ref 12.0–15.0)
MCHC: 32.6 g/dL (ref 30.0–36.0)
RBC: 4.74 MIL/uL (ref 3.87–5.11)
WBC: 16.5 10*3/uL — ABNORMAL HIGH (ref 4.0–10.5)

## 2010-12-24 MED ORDER — METHYLPREDNISOLONE 16 MG PO TABS: 48.0000 mg | ORAL_TABLET | Freq: Every day | ORAL | Status: AC

## 2010-12-24 MED ORDER — LEVOFLOXACIN 750 MG PO TABS: 750.0000 mg | ORAL_TABLET | Freq: Every day | ORAL | Status: AC

## 2010-12-24 NOTE — Progress Notes (Signed)
Subjective: Pt feels much improved today.  Is requesting to go home today.  Reports a decrease in sputum production and mentions that she is at baseline breathing status. Objective: Filed Vitals:   12/24/10 0432 12/24/10 0640 12/24/10 0956 12/24/10 1400  BP:  122/74  115/58  Pulse:  106  88  Temp:  97.8 F (36.6 C)  97.9 F (36.6 C)  TempSrc:  Oral  Oral  Resp:  18  18  Height:      Weight:      SpO2: 88% 95% 93% 95%   Weight change:   Intake/Output Summary (Last 24 hours) at 12/24/10 1721 Last data filed at 12/24/10 1600  Gross per 24 hour  Intake    480 ml  Output    200 ml  Net    280 ml    General: Alert, awake, oriented x3, in no acute distress.  HEENT: No bruits, no goiter.  Heart: Regular rate and rhythm, without murmurs, rubs, gallops.  Lungs: Clear to auscultation Abdomen: Soft, nontender, nondistended, positive bowel sounds.  Neuro: Grossly intact, nonfocal.   Lab Results: No results found for this basename: NA:2,K:2,CL:2,CO2:2,GLUCOSE:2,BUN:2,CREATININE:2,CALCIUM:2,MG:2,PHOS:2 in the last 72 hours No results found for this basename: AST:2,ALT:2,ALKPHOS:2,BILITOT:2,PROT:2,ALBUMIN:2 in the last 72 hours No results found for this basename: LIPASE:2,AMYLASE:2 in the last 72 hours  Basename 12/24/10 1526 12/23/10 0531  WBC 16.5* 19.5*  NEUTROABS -- --  HGB 13.9 14.0  HCT 42.6 43.6  MCV 89.9 91.8  PLT 332 306   No results found for this basename: CKTOTAL:3,CKMB:3,CKMBINDEX:3,TROPONINI:3 in the last 72 hours No components found with this basename: POCBNP:3 No results found for this basename: DDIMER:2 in the last 72 hours No results found for this basename: HGBA1C:2 in the last 72 hours No results found for this basename: CHOL:2,HDL:2,LDLCALC:2,TRIG:2,CHOLHDL:2,LDLDIRECT:2 in the last 72 hours No results found for this basename: TSH,T4TOTAL,FREET3,T3FREE,THYROIDAB in the last 72 hours No results found for this basename:  VITAMINB12:2,FOLATE:2,FERRITIN:2,TIBC:2,IRON:2,RETICCTPCT:2 in the last 72 hours  Micro Results: No results found for this or any previous visit (from the past 240 hour(s)).  Studies/Results: No results found.  Medications: I have reviewed the patient's current medications.   Patient Active Hospital Problem List: C O P D (05/25/2007) 1) At this point improved.  Will plan on sending pt home with antibiotics and steroid taper.   ALLERGIC RHINITIS (07/03/2007) Stable  DYSPNEA (06/11/2007) At baseline currently  G E R D (06/06/2007) Stable Aortic stenosis (07/03/2010) Stable  Insomnia (12/22/2010) resolved Leukocytosis (12/23/2010) WBC trending down.  Pt requesting to go home.  Clinically improved and will discharge patient.  She is aware that she is to take and finish all of her antibiotics and take medication as indicated.  Daughter has agreed to help her at home.     LOS: 3 days   Penny Pia M.D.  [facility] 12/24/2010, 5:21 PM  '

## 2010-12-24 NOTE — Discharge Summary (Signed)
Physician Discharge Summary  Patient ID: Katie Bryant MRN: [id number] DOB/AGE: [birth year] 77 y.o.  Admit date: 12/21/2010 Discharge date: 12/24/2010  Admission Diagnoses: same as D/C diagnoses  Discharge Diagnoses:  Principal Problem:  *C O P D Active Problems:  ALLERGIC RHINITIS  DYSPNEA  G E R D  Aortic stenosis  Insomnia  Leukocytosis   Discharged Condition: good  Hospital Course: Pt is a 77 y/o with a h/o COPD with prior hospitalizations for COPD exacerbation.  That presented to the hospital with c/o increase in cough, sputum production, and dyspnea.  Pt was treated for COPD exacerbation.  Initial chest x ray showed left lung pneumonia.  Pt improved on IV steroids, antibiotics, and duonebs.  Pt had a leukocytosis which was trending down and given clinical improvement pt was agreeable to go home today.  She reassures me that she will take medication as indicated and will f/u with her pulmonologist in 1-2 weeks.  Pt was able to be transitioned to oral steroids and will be discharged home on oral antibiotics at the same dose she was getting while hospitalized.  Consults: none  Significant Diagnostic Studies: chest xray  IMPRESSION:  1. Left lung pneumonia. 2. Moderate pulmonary venous congestion.    Treatments: as indicated above  Discharge Exam: Blood pressure 115/58, pulse 88, temperature 97.9 F (36.6 C), temperature source Oral, resp. rate 18, height 5\' 6"  (1.676 m), weight 86.8 kg (191 lb 5.8 oz), SpO2 95.00%. General appearance: alert and cooperative Head: Normocephalic, without obvious abnormality, atraumatic Eyes: conjunctivae/corneas clear. PERRL, EOM's intact. Fundi benign. Nose: Nares normal. Septum midline. Mucosa normal. No drainage or sinus tenderness. Neck: no adenopathy, no carotid bruit, no JVD, supple, symmetrical, trachea midline and thyroid not enlarged, symmetric, no tenderness/mass/nodules Resp: Prolonged exp phase, no wheezes or rhales Chest  wall: no tenderness Cardio: regular rate and rhythm, S1, S2 normal, + systolic murmur GI: soft, non-tender; bowel sounds normal; no masses,  no organomegaly Extremities: extremities normal, atraumatic, no cyanosis or edema Skin: Skin color, texture, turgor normal. No rashes or lesions  Disposition:   Discharge Orders    Future Orders Please Complete By Expires   Diet - low sodium heart healthy      Increase activity slowly      Discharge instructions      Comments:   Pt is to take medication as indicated.  Also is to f/u with her pulmonologist in 1-2 weeks.  Pt agrees and verbalizes understanding.   Call MD for:  temperature >100.4      Call MD for:  difficulty breathing, headache or visual disturbances        Medication List  As of 12/24/2010  5:34 PM   START taking these medications         levofloxacin 750 MG tablet   Commonly known as: LEVAQUIN   Take 1 tablet (750 mg total) by mouth daily.      methylPREDNISolone 16 MG tablet   Commonly known as: MEDROL   Take 3 tablets (48 mg total) by mouth daily before breakfast.         CONTINUE taking these medications         albuterol 108 (90 BASE) MCG/ACT inhaler   Commonly known as: PROVENTIL HFA;VENTOLIN HFA      ALPRAZolam 0.5 MG tablet   Commonly known as: XANAX      budesonide 0.5 MG/2ML nebulizer solution   Commonly known as: PULMICORT      buPROPion 150 MG 12 hr  tablet   Commonly known as: WELLBUTRIN SR      DIOVAN HCT 80-12.5 MG per tablet   Generic drug: valsartan-hydrochlorothiazide      fenofibrate micronized 134 MG capsule   Commonly known as: LOFIBRA      guaiFENesin 600 MG 12 hr tablet   Commonly known as: MUCINEX      ipratropium-albuterol 0.5-2.5 (3) MG/3ML Soln   Commonly known as: DUONEB      meclizine 25 MG tablet   Commonly known as: ANTIVERT      metoprolol tartrate 25 MG tablet   Commonly known as: LOPRESSOR   TAKE ONE HALF TABLET BY MOUTH TWICE DAILY      simvastatin 20 MG tablet     Commonly known as: ZOCOR      TYLENOL ARTHRITIS PAIN PO         STOP taking these medications         azithromycin 250 MG tablet          Where to get your medications    These are the prescriptions that you need to pick up.   You may get these medications from any pharmacy.         levofloxacin 750 MG tablet   methylPREDNISolone 16 MG tablet           Follow-up Information    Follow up with Monica Becton, MD .         Signed: Penny Pia 12/24/2010, 5:34 PM

## 2010-12-24 NOTE — Progress Notes (Signed)
Patient given discharge instructions and prescription hard copies. Prescriptions called into [facility] in [place] , [place], per patient request. Patient and family verbalized understanding of instructions. Iv site removed, catheter tip intact. Tolerating diet, able to ambulate in hallway. Leaving unit in wheelchair accompanied by staff and family member.

## 2011-01-08 ENCOUNTER — Telehealth: Payer: Self-pay | Admitting: Internal Medicine

## 2011-01-08 NOTE — Telephone Encounter (Signed)
Per [facility] to change to [facility] in [name] might be easier on patient as well

## 2011-01-08 NOTE — Telephone Encounter (Signed)
I spoke with pt and is requesting to change from [facility] to see [facility] in [place] since that is where she lives. Pt is aware of our protocol for changing providers. Please advise if you are okay with the switch Dr. Maple Hudson, thanks

## 2011-01-08 NOTE — Telephone Encounter (Signed)
Please advise Dr. Wert if you are okay with the switch, thanks 

## 2011-01-09 NOTE — Telephone Encounter (Signed)
Fine with me, needs to bring all meds and first available slot

## 2011-01-09 NOTE — Telephone Encounter (Signed)
I spoke with pt and is scheduled to come in 01/21/11 at 11:00. Pt aware of the address and to bring all meds. Pt also aware to arrive 15 min early to fill out paperwork

## 2011-01-21 ENCOUNTER — Ambulatory Visit (INDEPENDENT_AMBULATORY_CARE_PROVIDER_SITE_OTHER): Payer: Medicare Other | Admitting: Internal Medicine

## 2011-01-21 ENCOUNTER — Encounter: Payer: Self-pay | Admitting: Internal Medicine

## 2011-01-21 DIAGNOSIS — J189 Pneumonia, unspecified organism: Secondary | ICD-10-CM

## 2011-01-21 DIAGNOSIS — J449 Chronic obstructive pulmonary disease, unspecified: Secondary | ICD-10-CM

## 2011-01-21 NOTE — Assessment & Plan Note (Addendum)
-   PFT's 06/11/2007  FEV1   0.70 (34%) ratio 32 with 49% improvement p B2   GOLD III but with a significant reversible asthmatic component that is chronically difficult to control.  DDX of  difficult airways managment all start with A and  include Adherence, Ace Inhibitors, Acid Reflux, Active Sinus Disease, Alpha 1 Antitripsin deficiency, Anxiety masquerading as Airways dz,  ABPA,  allergy(esp in young), Aspiration (esp in elderly), Adverse effects of DPI,  Active smokers, plus two Bs  = Bronchiectasis and Beta blocker use..and one C= CHF  In this case Adherence is the biggest issue and starts with  inability to use HFA effectively and also  understand that SABA treats the symptoms but doesn't get to the underlying problem (inflammation).  I used  the analogy of putting steroid cream on a rash to help explain the meaning of topical therapy and the need to get the drug to the target tissue.   The proper method of use, as well as anticipated side effects, of this metered-dose inhaler are discussed and demonstrated to the patient. Improved to 75% p coaching.  Struggling severely with concept of med reconciliation.  To keep things simple, I have asked the patient to first separate medicines that are perceived as maintenance, that is to be taken daily "no matter what", from those medicines that are taken on only on an as-needed basis and I have given the patient examples of both, and then return to see our NP to generate a  detailed  medication calendar which should be followed until the next physician sees the patient and updates it.

## 2011-01-21 NOTE — Patient Instructions (Addendum)
Only use your albuterol as a rescue medication to be used if you can't catch your breath by resting or doing a relaxed purse lip breathing pattern. The less you use it, the better it will work when you need it.   Work on inhaler technique:  relax and gently blow all the way out then take a nice smooth deep breath back in, triggering the inhaler at same time you start breathing in.  Hold for up to 5 seconds if you can.  Rinse and gargle with water when done   If your mouth or throat starts to bother you,   I suggest you time the inhaler to your dental care and after using the inhaler(s) brush teeth and tongue with a baking soda containing toothpaste and when you rinse this out, gargle with it first to see if this helps your mouth and throat.     See Tammy NP w/in 2 weeks with all your medications, even over the counter meds, separated in two separate bags, the ones you take no matter what vs the ones you stop once you feel better and take only as needed when you feel you need them.   Tammy  will generate for you a new user friendly medication calendar that will put [place] all on the same page re: your medication use.     Without this process, it simply isn't possible to assure that we are providing  your outpatient care  with  the attention to detail we feel you deserve.   If we cannot assure that you're getting that kind of care,  then we cannot manage your problem effectively from this clinic.  Once you have seen Tammy and we are sure that we're all on the same page with your medication use she will arrange follow up with me in the [place] office  ADD Be sure she has f/u cxr on file since 12/21/10 to f/u PNA

## 2011-01-21 NOTE — Assessment & Plan Note (Signed)
Need f/u cxr report - requested

## 2011-01-21 NOTE — Progress Notes (Signed)
Subjective:     Patient ID: DESHAWNA MCNEECE, female   DOB: [birth year], 78 y.o.   MRN: [id number]  HPI  33 yowf with h/o smoking/ copd to establish in [facility] p hospitalized at [facility]   Admit date: 12/21/2010  Discharge date: 12/24/2010  *C O P D  ALLERGIC RHINITIS  DYSPNEA  G E R D  Aortic stenosis  Insomnia  Leukocytosis   Discharged Condition: good   Hospital Course: Pt is a 78 y/o with a h/o COPD with prior hospitalizations for COPD exacerbation. That presented to the hospital with c/o increase in cough, sputum production, and dyspnea. Pt was treated for COPD exacerbation. Initial chest x ray showed left lung pneumonia. Pt improved on IV steroids, antibiotics, and duonebs. Pt had a leukocytosis which was trending down and given clinical improvement pt was agreeable to go home today. She reassures me that she will take medication as indicated and will f/u with her pulmonologist in 1-2 weeks. Pt was able to be transitioned to oral steroids and will be discharged home on oral antibiotics at the same dose she was getting while hospitalized.  01/21/2011 f/u ov/Avina Eberle cc  baselin doe [name] off 02 and on 02 can do the whole store s stopping at 3lpm but discharged on 4lpm does not wear it 24 hours 25% of the time. Uses multiple forms of albuterol. Cough is better, sputum mucoid.   Sleeping ok without nocturnal  or early am exacerbation  of respiratory  c/o's or need for noct saba. Also denies any obvious fluctuation of symptoms with weather or environmental changes or other aggravating or alleviating factors except as outlined above   ROS  At present neg for  any significant sore throat, dysphagia, itching, sneezing,  nasal congestion or excess/ purulent secretions,  fever, chills, sweats, unintended wt loss, pleuritic or exertional cp, hempoptysis, orthopnea pnd or leg swelling.  Also denies presyncope, palpitations, heartburn, abdominal pain, nausea, vomiting, diarrhea  or change in  bowel or urinary habits, dysuria,hematuria,  rash, arthralgias, visual complaints, headache, numbness weakness or ataxia.      Review of Systems     Objective:   Physical Exam Pleasant but anxious amb wf nad Wt 184 01/21/2011  HEENT mild turbinate edema.  Oropharynx no thrush or excess pnd or cobblestoning.  No JVD or cervical adenopathy. Mild accessory muscle hypertrophy. Trachea midline, nl thryroid. Chest was hyperinflated by percussion with diminished breath sounds and moderate increased exp time without wheeze. Hoover sign positive at mid inspiration. Regular rate and rhythm without murmur gallop or rub or increase P2 or edema.  Abd: no hsm, nl excursion. Ext warm without cyanosis or clubbing.       cxr 12/21/10 1. Left lung pneumonia.  2. Moderate pulmonary venous congestion.    Assessment:         Plan:

## 2011-01-21 NOTE — Progress Notes (Deleted)
Patient ID: Katie Bryant, female   DOB: May 06, 1928, 78 y.o.   MRN: [id number]  sdfs

## 2011-01-27 ENCOUNTER — Encounter: Payer: Self-pay | Admitting: Internal Medicine

## 2011-01-28 ENCOUNTER — Other Ambulatory Visit: Payer: Self-pay | Admitting: Cardiovascular Disease

## 2011-01-28 MED ORDER — METOPROLOL TARTRATE 25 MG PO TABS: 12.5000 mg | ORAL_TABLET | Freq: Two times a day (BID) | ORAL | Status: DC

## 2011-01-28 MED ORDER — VALSARTAN-HYDROCHLOROTHIAZIDE 80-12.5 MG PO TABS: 1.0000 | ORAL_TABLET | Freq: Every day | ORAL | Status: DC

## 2011-01-28 NOTE — Telephone Encounter (Signed)
New msg Pt wants it sent to [facility] in [place]

## 2011-02-03 ENCOUNTER — Encounter: Payer: Medicare Other | Admitting: Adult Health

## 2011-02-03 ENCOUNTER — Ambulatory Visit (INDEPENDENT_AMBULATORY_CARE_PROVIDER_SITE_OTHER): Payer: Medicare Other | Admitting: Adult Health

## 2011-02-03 ENCOUNTER — Encounter: Payer: Self-pay | Admitting: Adult Health

## 2011-02-03 ENCOUNTER — Ambulatory Visit (INDEPENDENT_AMBULATORY_CARE_PROVIDER_SITE_OTHER)
Admission: RE | Admit: 2011-02-03 | Discharge: 2011-02-03 | Disposition: A | Discharge status: Home / Self Care | Payer: Medicare Other | Source: Ambulatory Visit | Attending: Adult Health | Admitting: Adult Health

## 2011-02-03 VITALS — BP 140/72 | HR 95 | Temp 97.4°F | Ht 66.0 in | Wt 187.4 lb

## 2011-02-03 DIAGNOSIS — J449 Chronic obstructive pulmonary disease, unspecified: Secondary | ICD-10-CM

## 2011-02-03 DIAGNOSIS — J189 Pneumonia, unspecified organism: Secondary | ICD-10-CM

## 2011-02-03 NOTE — Progress Notes (Signed)
Subjective:     Patient ID: Katie Bryant, female   DOB: 1928/02/07, 78 y.o.   MRN: [id number]  HPI  10 yowf with h/o smoking/ copd to establish in [facility] p hospitalized at [facility]   Admit date: 12/21/2010  Discharge date: 12/24/2010  *C O P D  ALLERGIC RHINITIS  DYSPNEA  G E R D  Aortic stenosis  Insomnia  Leukocytosis   Discharged Condition: good   Hospital Course: Pt is a 78 y/o with a h/o COPD with prior hospitalizations for COPD exacerbation. That presented to the hospital with c/o increase in cough, sputum production, and dyspnea. Pt was treated for COPD exacerbation. Initial chest x ray showed left lung pneumonia. Pt improved on IV steroids, antibiotics, and duonebs. Pt had a leukocytosis which was trending down and given clinical improvement pt was agreeable to go home today. She reassures me that she will take medication as indicated and will f/u with her pulmonologist in 1-2 weeks. Pt was able to be transitioned to oral steroids and will be discharged home on oral antibiotics at the same dose she was getting while hospitalized.  76/15/2013 f/u ov/Wert cc  baselin doe [name] off 02 and on 02 can do the whole store s stopping at 3lpm but discharged on 4lpm does not wear it 24 hours 25% of the time. Uses multiple forms of albuterol. Cough is better, sputum mucoid.  >no changes   02/03/11 Follow up and med review Pt returns for follow up and med review.  We reviewed all her meds and organized them into a med calendar with pt education .  She appears to be taking her meds correctly.  She has returned back to her baseline from recent PNA episode last month. CXR today shows complete clearance on xray.  No chest  Pain or hemotpysis. No edema.    ROS   Constitutional:   No  weight loss, night sweats,  Fevers, chills,  +fatigue, or  lassitude.  HEENT:   No headaches,  Difficulty swallowing,  Tooth/dental problems, or  Sore throat,                No sneezing, itching,  ear ache, nasal congestion, post nasal drip,   CV:  No chest pain,  Orthopnea, PND, swelling in lower extremities, anasarca, dizziness, palpitations, syncope.   GI  No heartburn, indigestion, abdominal pain, nausea, vomiting, diarrhea, change in bowel habits, loss of appetite, bloody stools.   Resp:  ,  No coughing up of blood.  No change in color of mucus.  No wheezing.  No chest wall deformity  Skin: no rash or lesions.  GU: no dysuria, change in color of urine, no urgency or frequency.  No flank pain, no hematuria   MS:  No joint pain or swelling.  No decreased range of motion.     Psych:  No change in mood or affect. No depression or anxiety.           Objective:   Physical Exam Pleasant but anxious amb wf nad Wt 184 01/21/2011 >187 02/03/11  HEENT mild turbinate edema.  Oropharynx no thrush or excess pnd or cobblestoning.  No JVD or cervical adenopathy. Mild accessory muscle hypertrophy. Trachea midline, nl thryroid. Chest was hyperinflated by percussion with diminished breath sounds Regular rate and rhythm without murmur gallop or rub or increase P2 or edema.  Abd: no hsm, nl excursion. Ext warm without cyanosis or clubbing.       cxr 12/21/10  1. Left lung pneumonia.  2. Moderate pulmonary venous congestion.  CXR 02/03/11  Interval resolution of the previously seen infiltrates/edema  with stable chronic changes.    Assessment:         Plan:

## 2011-02-03 NOTE — Patient Instructions (Signed)
Follow med calendar closely and bring to each visit.  Follow up Dr. Sherene Sires  In 4 weeks and As needed   I will call with chest xray results .

## 2011-02-04 ENCOUNTER — Telehealth: Payer: Self-pay | Admitting: Adult Health

## 2011-02-04 MED ORDER — ALBUTEROL SULFATE HFA 108 (90 BASE) MCG/ACT IN AERS: 2.0000 | INHALATION_SPRAY | Freq: Four times a day (QID) | RESPIRATORY_TRACT | Status: DC | PRN

## 2011-02-04 NOTE — Telephone Encounter (Signed)
Contacted patient. Ventolin is on her med list. Sent refill to pt's pharmacy. Patient is aware.

## 2011-02-04 NOTE — Assessment & Plan Note (Signed)
Cleared infiltrate on cxr.  Clinically back to her baseline.  Cont on current regimen.

## 2011-02-04 NOTE — Assessment & Plan Note (Signed)
Compensated on present regimen.  Patient's medications were reviewed today and patient education was given. Computerized medication calendar was adjusted/completed  follow up in 2 months with Dr. Sherene Sires  And As needed

## 2011-02-06 ENCOUNTER — Telehealth: Payer: Self-pay | Admitting: Internal Medicine

## 2011-02-06 NOTE — Telephone Encounter (Signed)
Called and spoke with pt.  Pt states when rx was called in the other day to pharmacy, she went to pick it up and was for Proventil.  Pt states she cannot afford this- cost $65.  Pt requesting this be changed back to Ventolin.  Called and spoke with Florentina Addison, pharmacist at [facility] and changed inhaler back to Ventolin per pt's request.  Pt aware of this.

## 2011-02-12 ENCOUNTER — Encounter: Payer: Self-pay | Admitting: Cardiology

## 2011-02-12 ENCOUNTER — Ambulatory Visit (INDEPENDENT_AMBULATORY_CARE_PROVIDER_SITE_OTHER): Payer: Medicare Other | Admitting: Cardiology

## 2011-02-12 DIAGNOSIS — E785 Hyperlipidemia, unspecified: Secondary | ICD-10-CM | POA: Insufficient documentation

## 2011-02-12 DIAGNOSIS — I359 Nonrheumatic aortic valve disorder, unspecified: Secondary | ICD-10-CM

## 2011-02-12 DIAGNOSIS — I10 Essential (primary) hypertension: Secondary | ICD-10-CM

## 2011-02-12 DIAGNOSIS — I350 Nonrheumatic aortic (valve) stenosis: Secondary | ICD-10-CM

## 2011-02-12 MED ORDER — SALINE NASAL SPRAY 0.65 % NA SOLN: NASAL | Status: DC

## 2011-02-12 MED ORDER — TEMAZEPAM 30 MG PO CAPS: 30.0000 mg | ORAL_CAPSULE | Freq: Every evening | ORAL | Status: DC | PRN

## 2011-02-12 MED ORDER — ACETAMINOPHEN 500 MG PO TABS: 500.0000 mg | ORAL_TABLET | Freq: Four times a day (QID) | ORAL | Status: AC | PRN

## 2011-02-12 MED ORDER — BUPROPION HCL ER (SR) 150 MG PO TB12: 150.0000 mg | ORAL_TABLET | Freq: Two times a day (BID) | ORAL | Status: DC

## 2011-02-12 MED ORDER — DIPHENHYDRAMINE HCL 25 MG PO CAPS: ORAL_CAPSULE | ORAL | Status: DC

## 2011-02-12 MED ORDER — MECLIZINE HCL 25 MG PO TABS: 25.0000 mg | ORAL_TABLET | Freq: Three times a day (TID) | ORAL | Status: DC | PRN

## 2011-02-12 MED ORDER — ALPRAZOLAM 0.5 MG PO TABS: 0.5000 mg | ORAL_TABLET | Freq: Two times a day (BID) | ORAL | Status: DC | PRN

## 2011-02-12 MED ORDER — FENOFIBRATE MICRONIZED 134 MG PO CAPS: 134.0000 mg | ORAL_CAPSULE | Freq: Every day | ORAL | Status: DC

## 2011-02-12 MED ORDER — IPRATROPIUM-ALBUTEROL 0.5-2.5 (3) MG/3ML IN SOLN: 3.0000 mL | Freq: Four times a day (QID) | RESPIRATORY_TRACT | Status: DC

## 2011-02-12 MED ORDER — GUAIFENESIN ER 600 MG PO TB12: 600.0000 mg | ORAL_TABLET | Freq: Two times a day (BID) | ORAL | Status: DC

## 2011-02-12 MED ORDER — SIMVASTATIN 20 MG PO TABS: 20.0000 mg | ORAL_TABLET | Freq: Every day | ORAL | Status: DC

## 2011-02-12 MED ORDER — FAMOTIDINE 20 MG PO TABS: 20.0000 mg | ORAL_TABLET | Freq: Every evening | ORAL | Status: DC | PRN

## 2011-02-12 MED ORDER — BUDESONIDE 0.5 MG/2ML IN SUSP: 0.5000 mg | Freq: Two times a day (BID) | RESPIRATORY_TRACT | Status: DC

## 2011-02-12 NOTE — Assessment & Plan Note (Signed)
This has been mild in the past. Nothing on her exam would suggest worsening disease. No change in therapy or further imaging is indicated.

## 2011-02-12 NOTE — Progress Notes (Signed)
HPI The patient presents for followup of a questionable history of cardiomegaly which was identified in the past chest x-ray. However, she's never been told of a cardiomyopathy. She has had one echocardiogram in the past this demonstrated a mild left ventricle hypertrophy with normal left ventricular function and mild aortic stenosis. She has chronic lung disease and is managed with this. She presents for routine followup. She's been feeling well. She's not having any new shortness of breath though she has COPD and uses oxygen. She's not having any PND or orthopnea. She's not having any palpitations, presyncope or syncope. She denies chest discomfort. She does have episodes of dizziness. She does have anxiety attacks.   Allergies  Allergen Reactions  . Other     STEROIDS, pt staes she blows up and gains weight    Current Outpatient Prescriptions  Medication Sig Dispense Refill  . acetaminophen (TYLENOL) 500 MG tablet Take 1 tablet (500 mg total) by mouth every 6 (six) hours as needed for pain.      Marland Kitchen albuterol (PROVENTIL HFA;VENTOLIN HFA) 108 (90 BASE) MCG/ACT inhaler Inhale 2 puffs into the lungs every 6 (six) hours as needed. For wheezing and shortness of breath  1 Inhaler  5  . ALPRAZolam (XANAX) 0.5 MG tablet Take 1 tablet (0.5 mg total) by mouth 2 (two) times daily as needed for anxiety. For anxiety/sleep      . budesonide (PULMICORT) 0.5 MG/2ML nebulizer solution Take 2 mLs (0.5 mg total) by nebulization 2 (two) times daily.      Marland Kitchen buPROPion (WELLBUTRIN SR) 150 MG 12 hr tablet Take 1 tablet (150 mg total) by mouth 2 (two) times daily.      . diphenhydrAMINE (BENADRYL) 25 mg capsule Per bottle      . famotidine (PEPCID) 20 MG tablet Take 1 tablet (20 mg total) by mouth at bedtime as needed (for reflux).      . fenofibrate micronized (LOFIBRA) 134 MG capsule Take 1 capsule (134 mg total) by mouth daily before breakfast.      . guaiFENesin (MUCINEX) 600 MG 12 hr tablet Take 1 tablet (600  mg total) by mouth 2 (two) times daily. For congestion      . ipratropium-albuterol (DUONEB) 0.5-2.5 (3) MG/3ML SOLN Take 3 mLs by nebulization 4 (four) times daily.      . meclizine (ANTIVERT) 25 MG tablet Take 1 tablet (25 mg total) by mouth every 8 (eight) hours as needed for dizziness. For dizziness      . metoprolol tartrate (LOPRESSOR) 25 MG tablet Take 0.5 tablets (12.5 mg total) by mouth 2 (two) times daily.  90 tablet  3  . simvastatin (ZOCOR) 20 MG tablet Take 1 tablet (20 mg total) by mouth at bedtime.      . sodium chloride (OCEAN NASAL SPRAY) 0.65 % nasal spray 2 puffs every 4-6 hours as needed for nasal congestion      . temazepam (RESTORIL) 30 MG capsule Take 1 capsule (30 mg total) by mouth at bedtime as needed for sleep.      . valsartan-hydrochlorothiazide (DIOVAN HCT) 80-12.5 MG per tablet Take 1 tablet by mouth daily.  90 tablet  2    Past Medical History  Diagnosis Date  . COPD (chronic obstructive pulmonary disease)   . Oxygen dependent   . Aortic stenosis, mild   . Obesity   . Hypertension   . Anxiety   . SOB (shortness of breath) on exertion   . Back pain   .  Headache   . Breast cancer   . Asthma   . Sleep apnea   . DEMENTIA   . GERD (gastroesophageal reflux disease)   . Arthritis   . Pneumonia   . H/O hiatal hernia   . Depression     Past Surgical History  Procedure Date  . Cardiac catheterization     EF 55-60%  . Tonsillectomy and adenoidectomy 1942  . Breast reconstruction   . Appendectomy   . Mastectomy 1980    ROS:  As stated in the HPI and negative for all other systems.  PHYSICAL EXAM BP 125/70  Ht 5\' 6"  (1.676 m)  Wt 185 lb (83.915 kg)  BMI 29.86 kg/m2 GENERAL:  Well appearing HEENT:  Pupils equal round and reactive, fundi not visualized, oral mucosa unremarkable NECK:  No jugular venous distention, waveform within normal limits, carotid upstroke brisk and symmetric, no bruits, no thyromegaly LYMPHATICS:  No cervical, inguinal  adenopathy LUNGS:  Clear to auscultation bilaterally, no wheezing or crackles BACK:  No CVA tenderness CHEST:  Unremarkable HEART:  PMI not displaced or sustained,S1 and S2 within normal limits, no S3, no S4, no clicks, no rubs, 2/6 systolic murmur her best at the left upper sternal border, diminished heart sounds ABD:  Flat, positive bowel sounds normal in frequency in pitch, no bruits, no rebound, no guarding, no midline pulsatile mass, no hepatomegaly, no splenomegaly EXT:  2 plus pulses throughout, no edema, no cyanosis no clubbing SKIN:  No rashes no nodules NEURO:  Cranial nerves II through XII grossly intact, motor grossly intact throughout PSYCH:  Cognitively intact, oriented to person place and time  EKG:  Sinus rhythm, rate 76, left axis deviation, left anterior fascicular block, poor anterior R wave progression, no acute ST-T wave changes.  02/12/2011   ASSESSMENT AND PLAN

## 2011-02-12 NOTE — Patient Instructions (Addendum)
The current medical regimen is effective;  continue present plan and medications.  Follow up in 1 year with Dr Hochrein.  You will receive a letter in the mail 2 months before you are due.  Please call us when you receive this letter to schedule your follow up appointment.  

## 2011-02-12 NOTE — Progress Notes (Signed)
Addended by: Boone Master E on: 02/12/2011 01:00 PM   Modules accepted: Orders

## 2011-02-12 NOTE — Assessment & Plan Note (Signed)
Her last HDL was 53 with an LDL of 95. No change in therapy is indicated.

## 2011-02-12 NOTE — Assessment & Plan Note (Signed)
Her blood pressure is well controlled and she will continue the meds as listed.

## 2011-02-26 ENCOUNTER — Ambulatory Visit: Payer: Medicare Other | Admitting: Internal Medicine

## 2011-03-03 ENCOUNTER — Telehealth: Payer: Self-pay | Admitting: Cardiology

## 2011-03-03 MED ORDER — VALSARTAN-HYDROCHLOROTHIAZIDE 80-12.5 MG PO TABS: 1.0000 | ORAL_TABLET | Freq: Every day | ORAL | Status: DC

## 2011-03-03 NOTE — Telephone Encounter (Signed)
Pt is on diovan and she wants to take the generic since they have one now and it is cheaper

## 2011-03-03 NOTE — Telephone Encounter (Signed)
Reordered RX in system for generic only.

## 2011-03-10 ENCOUNTER — Other Ambulatory Visit: Payer: Self-pay | Admitting: *Deleted

## 2011-03-10 ENCOUNTER — Telehealth: Payer: Self-pay | Admitting: Cardiology

## 2011-03-10 MED ORDER — SIMVASTATIN 20 MG PO TABS: 20.0000 mg | ORAL_TABLET | Freq: Every day | ORAL | Status: DC

## 2011-03-10 NOTE — Telephone Encounter (Signed)
New msg Pt wants to clarify her meds with you

## 2011-03-10 NOTE — Telephone Encounter (Signed)
Voicemail service has not been established yet.  Will attempt to call again.

## 2011-03-10 NOTE — Telephone Encounter (Signed)
Spoke with pt who states that she needs her simvastatin refilled but when she went to pick it up she was told it hold been stopped.  In reviewing the chart no evidence is found that she should not be continuing the simvastatin.  Refill sent into [facility] in [place] as requested.

## 2011-03-11 ENCOUNTER — Ambulatory Visit: Payer: Medicare Other | Admitting: Internal Medicine

## 2011-03-11 ENCOUNTER — Encounter: Payer: Self-pay | Admitting: Internal Medicine

## 2011-03-11 ENCOUNTER — Ambulatory Visit (INDEPENDENT_AMBULATORY_CARE_PROVIDER_SITE_OTHER): Payer: Medicare Other | Admitting: Internal Medicine

## 2011-03-11 DIAGNOSIS — J9612 Chronic respiratory failure with hypercapnia: Secondary | ICD-10-CM | POA: Insufficient documentation

## 2011-03-11 DIAGNOSIS — J9610 Chronic respiratory failure, unspecified whether with hypoxia or hypercapnia: Secondary | ICD-10-CM

## 2011-03-11 DIAGNOSIS — J449 Chronic obstructive pulmonary disease, unspecified: Secondary | ICD-10-CM

## 2011-03-11 DIAGNOSIS — J310 Chronic rhinitis: Secondary | ICD-10-CM

## 2011-03-11 NOTE — Patient Instructions (Addendum)
Ok to leave 02 at rest but always use it on exertion and at bedtime  See calendar for specific medication instructions and bring it back for each and every office visit for every healthcare provider you see.  Without it,  you may not receive the best quality medical care that we feel you deserve.  You will note that the calendar groups together  your maintenance  medications that are timed at particular times of the day.  Think of this as your checklist for what your doctor has instructed you to do until your next evaluation to see what benefit  there is  to staying on a consistent group of medications intended to keep you well.  The other group at the bottom is entirely up to you to use as you see fit  for specific symptoms that may arise between visits that require you to treat them on an as needed basis.  Think of this as your action plan or "what if" list.   Separating the top medications from the bottom group is fundamental to providing you adequate care going forward.    Please schedule a follow up visit in 3 months but call sooner if needed

## 2011-03-11 NOTE — Progress Notes (Signed)
Subjective:     Patient ID: Katie Bryant, female   DOB: [birth year]    MRN: [id number]  HPI  74 yowf quit smoking 1992 with GOLD III copd dx 2009  followed in [facility] p hospitalized at [facility]   Admit date: 12/21/2010  Discharge date: 12/24/2010  *C O P D  ALLERGIC RHINITIS  DYSPNEA  G E R D  Aortic stenosis  Insomnia  Leukocytosis   Discharged Condition: good   Hospital Course:  78 y/o with a h/o COPD with prior hospitalizations for COPD exacerbation. That presented to the hospital with c/o increase in cough, sputum production, and dyspnea. Pt was treated for COPD exacerbation. Initial chest x ray showed left lung pneumonia. Pt improved on IV steroids, antibiotics, and duonebs. Pt had a leukocytosis which was trending down and given clinical improvement pt was agreeable to go home today. She reassures me that she will take medication as indicated and will f/u with her pulmonologist in 1-2 weeks. Pt was able to be transitioned to oral steroids and will be discharged home on oral antibiotics at the same dose she was getting while hospitalized.  01/21/2011 f/u ov/Katie Bryant cc  baseline doe Katie Bryant off 02 and on 02 can do the whole store s stopping at 3lpm but discharged on 4lpm does not wear it 24 hours 25% of the time. Uses multiple forms of albuterol. Cough is better, sputum mucoid.  >no changes   02/03/11 Follow up and med review Pt returns for follow up and med review.  We reviewed all her meds and organized them into a med calendar with pt education .  She appears to be taking her meds correctly.  She has returned back to her baseline from recent PNA episode last month. CXR today shows complete clearance on xray.  No chest  Pain or hemotpysis. No edema.  cxr clear rec follow med calendar    03/11/2011 f/u ov/Katie Bryant cc ok x nose bleeds ? On 02 has calendar but not using it, ad libbing on 02 but overall feels making slow progress with return to nl activity tol and really no  limiting sob or cough or need for daytime 02  Sleeping ok without nocturnal  or early am exacerbation  of respiratory  c/o's or need for noct saba. Also denies any obvious fluctuation of symptoms with weather or environmental changes or other aggravating or alleviating factors except as outlined above   ROS  At present neg for  any significant sore throat, dysphagia, itching, sneezing,  nasal congestion or excess/ purulent secretions,  fever, chills, sweats, unintended wt loss, pleuritic or exertional cp, hempoptysis, orthopnea pnd or leg swelling.  Also denies presyncope, palpitations, heartburn, abdominal pain, nausea, vomiting, diarrhea  or change in bowel or urinary habits, dysuria,hematuria,  rash, arthralgias, visual complaints, headache, numbness weakness or ataxia.             Objective:   Physical Exam Pleasant but anxious amb wf nad Wt 184 01/21/2011 >187 02/03/11 > 184 03/11/2011  HEENT mild turbinate edema.  Oropharynx no thrush or excess pnd or cobblestoning.  No JVD or cervical adenopathy. Mild accessory muscle hypertrophy. Trachea midline, nl thryroid. Chest was hyperinflated by percussion with diminished breath sounds Regular rate and rhythm without murmur gallop or rub or increase P2 or edema.  Abd: no hsm, nl excursion. Ext warm without cyanosis or clubbing.       cxr 12/21/10 1. Left lung pneumonia.  2. Moderate pulmonary venous congestion.  CXR 02/03/11  Interval resolution of the previously seen infiltrates/edema  with stable chronic changes.    Assessment:         Plan:

## 2011-03-11 NOTE — Assessment & Plan Note (Signed)
rx per ent, should help to reduce 02 requirment to prn during the day and automatic only at hs

## 2011-03-11 NOTE — Assessment & Plan Note (Signed)
Improved to point where 02 not needed at rest/ med calendar corrected to reflect this change

## 2011-03-11 NOTE — Assessment & Plan Note (Addendum)
-   PFT's 06/11/2007  FEV1   0.70 (34%) ratio 32 with 49% improvement p B2    - HFA 75% effective p coaching 01/21/2011  -med calendar 02/03/11    GOLD III copd with a very dramatic reversible component at least in 2009 and back to nl baseline despite inconsistent med rx  I had an extended discussion with the patient today lasting 15 to 20 minutes of a 25 minute visit on the following issues:   Each maintenance medication was reviewed in detail including most importantly the difference between maintenance and as needed and under what circumstances the prns are to be used. This was done in the context of a medication calendar review which provided the patient with a user-friendly unambiguous mechanism for medication administration and reconciliation and provides an action plan for all active problems. It is critical that this be shown to every doctor  for modification during the office visit if necessary so the patient can use it as a working document.

## 2011-04-10 ENCOUNTER — Other Ambulatory Visit: Payer: Self-pay | Admitting: Cardiovascular Disease

## 2011-05-26 ENCOUNTER — Telehealth: Payer: Self-pay | Admitting: Internal Medicine

## 2011-05-26 NOTE — Telephone Encounter (Signed)
Received copies from [facility] ,on 05/26/11 . Forwarded 7 pages to Dr.Wert ,for review.

## 2011-05-26 NOTE — Telephone Encounter (Signed)
Called, spoke with pt who reports she was seen by PA at Dr. Kathi Der office on Thursday for chest congestion, prod cough with increase in mucus production, and increased SOB.  Mucus was white with some yellow.  She was given prednisone 20 mg 1 bid x 7 days and levaquin 500mg  1 qd x 7 days.  Also, taking mucinex bid.  Reports she does feel like she is improving.  Wanted to let MW know what was going on.  She was last seen by Dr. Sherene Sires in March and was asked to f/u in 3 months.  We have scheduled her to June f/u in [place] on June 18 at 10 am -- she will call back if her symptoms do not continue to improve.  Advised I would send msg to Dr. Sherene Sires as Lorain Childes.

## 2011-05-28 ENCOUNTER — Telehealth: Payer: Self-pay | Admitting: Internal Medicine

## 2011-05-28 ENCOUNTER — Ambulatory Visit: Payer: Medicare Other | Admitting: Pulmonary Disease

## 2011-05-28 NOTE — Telephone Encounter (Signed)
I spoke with Katie Bryant and she is scheduled to come in and see KC today at 4:00 since he had an opening

## 2011-06-17 ENCOUNTER — Other Ambulatory Visit: Payer: Self-pay | Admitting: Adult Health

## 2011-06-20 ENCOUNTER — Encounter: Payer: Self-pay | Admitting: Internal Medicine

## 2011-06-20 ENCOUNTER — Telehealth: Payer: Self-pay | Admitting: Internal Medicine

## 2011-06-20 ENCOUNTER — Ambulatory Visit (INDEPENDENT_AMBULATORY_CARE_PROVIDER_SITE_OTHER): Payer: Medicare Other | Admitting: Internal Medicine

## 2011-06-20 VITALS — BP 146/68 | HR 72 | Temp 98.1°F

## 2011-06-20 DIAGNOSIS — J9610 Chronic respiratory failure, unspecified whether with hypoxia or hypercapnia: Secondary | ICD-10-CM

## 2011-06-20 DIAGNOSIS — J449 Chronic obstructive pulmonary disease, unspecified: Secondary | ICD-10-CM

## 2011-06-20 MED ORDER — ALPRAZOLAM 0.5 MG PO TABS: 0.5000 mg | ORAL_TABLET | Freq: Two times a day (BID) | ORAL | Status: DC | PRN

## 2011-06-20 MED ORDER — FLUTTER DEVI: Status: DC

## 2011-06-20 MED ORDER — PREDNISONE (PAK) 10 MG PO TABS: ORAL_TABLET | ORAL | Status: AC

## 2011-06-20 MED ORDER — ALBUTEROL SULFATE HFA 108 (90 BASE) MCG/ACT IN AERS: 2.0000 | INHALATION_SPRAY | Freq: Four times a day (QID) | RESPIRATORY_TRACT | Status: DC | PRN

## 2011-06-20 NOTE — Progress Notes (Signed)
Subjective:     Patient ID: Katie Bryant, female   DOB: 1928-06-03    MRN: [id number]  HPI  3 yowf quit smoking 1992 with GOLD III copd dx 2009  followed in [facility] p hospitalized at [facility]   Admit date: 12/21/2010  Discharge date: 12/24/2010  *C O P D  ALLERGIC RHINITIS  DYSPNEA  G E R D  Aortic stenosis  Insomnia  Leukocytosis      Hospital Course:  78 y/o with a h/o COPD with prior hospitalizations for COPD exacerbation. That presented to the hospital with c/o increase in cough, sputum production, and dyspnea. Pt was treated for COPD exacerbation. Initial chest x ray showed left lung pneumonia. Pt improved on IV steroids, antibiotics, and duonebs.   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 Uses multiple forms of albuterol. Cough is better, sputum mucoid.  >no changes, med rec next step   02/03/11 Follow up and med review Pt returns for follow up and med review.  We reviewed all her meds and organized them into a med calendar with pt education .  She appears to be taking her meds correctly.  She has returned back to her baseline from recent PNA episode last month. CXR c/w complete clearance   rec follow med calendar   03/11/2011 f/u ov/Troi Bechtold cc ok x nose bleeds ? On 02 has calendar but not using it, ad libbing on 02 but overall feels making slow progress with return to nl activity tol and really no limiting sob or cough or need for daytime 02 rec Ok to leave 02 off at rest    06/20/2011 f/u ov/Ezrael Sam cc 2-3 weeks  increased cough, and sneezing > mucus turned thick yellow rx abx and steroids x 2 some better but very confused with meds/ names/ not following action plans at bottom of calendar.  Sleeping poorly with nocturnal  And  early am exacerbation  of respiratory  c/o's and  need for noct saba.   denies any obvious fluctuation of  symptoms with weather or environmental changes or other aggravating or alleviating factors except as outlined above   ROS  At present neg for  any significant sore throat, dysphagia, itching,   fever, chills, sweats, unintended wt loss, pleuritic or exertional cp, hempoptysis, orthopnea pnd or leg swelling.  Also denies presyncope, palpitations, heartburn, abdominal pain, nausea, vomiting, diarrhea  or change in bowel or urinary habits, dysuria,hematuria,  rash, arthralgias, visual complaints, headache, numbness weakness or ataxia.      Objective:   Physical Exam Pleasant but anxious amb wf nad Wt 184 01/21/2011 >187 02/03/11 > 184 03/11/2011 > 184 06/20/2011  HEENT mild turbinate edema.  Oropharynx no thrush or excess pnd or cobblestoning.  No JVD or cervical adenopathy. Mild accessory muscle hypertrophy. Trachea midline, nl thryroid. Chest was hyperinflated by percussion with diminished breath sounds Regular rate and rhythm without murmur gallop or rub or increase P2 or edema.  Abd: no hsm, nl excursion. Ext warm without cyanosis or clubbing.       CXR 02/03/11  Interval resolution of the previously seen infiltrates/edema  with stable chronic changes.    Assessment:         Plan:

## 2011-06-20 NOTE — Telephone Encounter (Signed)
Per Verlon Au, we can add pt to Dr. Thurston Hole schedule today at 11:45 am -- pt aware.  She will cancel the appt on June 18 with Dr. Sherene Sires today after OV if this is not needed.

## 2011-06-20 NOTE — Telephone Encounter (Signed)
Called, spoke with pt who states she has been "sick x 3 wks."  C/o prod cough with a lot of thick, yellowish-green mucus for "at least 3 wks" and reports she feels tight in thoat, feels like it is in her "vocal cords," and hoarse.  Denies increased SOB, wheezing, chest tightness, or f/c/s.  She is requesting OV this am with Dr. Sherene Sires.  No openings in office today.  She does have a pending OV with Dr. Sherene Sires on June 18 in [place] with Dr. Sherene Sires but would like to be seen before this.  Dr. Sherene Sires, pls advise.  Thank you.    Note:  Pt requesting we call her back at [phone].

## 2011-06-20 NOTE — Patient Instructions (Addendum)
Prednisone 10 mg take  4 each am x 2 days,   2 each am x 2 days,  1 each am x2days and stop   Add pepcid 20 mg one at bedtime (Zantac/ranitidine) 150 mg) one at bedtime  GERD (REFLUX)  is an extremely common cause of respiratory symptoms, many times with no significant heartburn at all.    It can be treated with medication, but also with lifestyle changes including avoidance of late meals, excessive alcohol, smoking cessation, and avoid fatty foods, chocolate, peppermint, colas, red wine, and acidic juices such as orange juice.  NO MINT OR MENTHOL PRODUCTS SO NO COUGH DROPS  USE SUGARLESS CANDY INSTEAD (jolley ranchers or Stover's)  NO OIL BASED VITAMINS - use powdered substitutes.   Flutter valve should help bring up mucus - use it as much as your want  Keep Follow up [facility] office Tuesday June 18   Late add: Clarify 02 use for problem list at next [facility]

## 2011-06-21 NOTE — Assessment & Plan Note (Addendum)
-   PFT's 06/11/2007  FEV1   0.70 (34%) ratio 32 with 49% improvement p B2    - HFA 75% effective p coaching 06/20/11   - med calendar 02/03/11  > not using correctly 06/20/11  GOLD III with freq exac and high risk assoc with poor cough mechanics and even poorer insight into meds  DDX of  difficult airways managment all start with A and  include Adherence, Ace Inhibitors, Acid Reflux, Active Sinus Disease, Alpha 1 Antitripsin deficiency, Anxiety masquerading as Airways dz,  ABPA,  allergy(esp in young), Aspiration (esp in elderly), Adverse effects of DPI,  Active smokers, plus two Bs  = Bronchiectasis and Beta blocker use..and one C= CHF  Adherence is always the initial "prime suspect" and is a multilayered concern that requires a "trust but verify" approach in every patient - starting with knowing how to use medications, especially inhalers, correctly, keeping up with refills and understanding the fundamental difference between maintenance and prns vs those medications only taken for a very short course and then stopped and not refilled.  The proper method of use, as well as anticipated side effects, of a metered-dose inhaler are discussed and demonstrated to the patient. Improved effectiveness after extensive coaching during this visit to a level of approximately  75%  ? Acid reflux > add noct h2 as maint  ? Acute/ active sinus dz > sinus ct next step if symptoms remain refractory  I had an extended discussion with the patient today lasting 15 to 20 minutes of a 25 minute visit on the following issues:     Each maintenance medication was reviewed in detail including most importantly the difference between maintenance and as needed and under what circumstances the prns are to be used. This was done in the context of a medication calendar review which provided the patient with a user-friendly unambiguous mechanism for medication administration and reconciliation and provides an action plan for all active  problems. It is critical that this be shown to every doctor  for modification during the office visit if necessary so the patient can use it as a working document.       For now add flutter and 6 day course of prednisone then regroup with all meds at [facility] June 18

## 2011-06-21 NOTE — Assessment & Plan Note (Signed)
Adequate control on present rx, reviewed need for 02 at hs and prn daytime

## 2011-06-24 ENCOUNTER — Ambulatory Visit (INDEPENDENT_AMBULATORY_CARE_PROVIDER_SITE_OTHER): Payer: Medicare Other | Admitting: Internal Medicine

## 2011-06-24 ENCOUNTER — Encounter: Payer: Self-pay | Admitting: Internal Medicine

## 2011-06-24 VITALS — BP 120/60 | HR 58 | Temp 98.0°F | Wt 183.0 lb

## 2011-06-24 DIAGNOSIS — J449 Chronic obstructive pulmonary disease, unspecified: Secondary | ICD-10-CM

## 2011-06-24 DIAGNOSIS — J9610 Chronic respiratory failure, unspecified whether with hypoxia or hypercapnia: Secondary | ICD-10-CM

## 2011-06-24 NOTE — Assessment & Plan Note (Signed)
-   PFT's 06/11/2007  FEV1   0.70 (34%) ratio 32 with 49% improvement p B2    - HFA 75% effective p coaching 06/20/2011 > 50% 06/24/2011  -med calendar 02/03/11  > not using correctly 06/20/11 > not using at all 06/24/2011   GOLD III with frequent aecopd and very poor insight again today into med use despite extended discussion with revised med calendar using red ink on last ov 5 d prior to OV    I had an extended discussion with the patient today lasting 15 to 20 minutes of a 25 minute visit on the following issues:   She needs to commit  today to more attention to detail regarding her care or she will not benefit from specialty care and there's no reason for her to return to see me.   To keep things simple, I have asked the patient to first separate medicines that are perceived as maintenance, that is to be taken daily "no matter what", from those medicines that are taken on only on an as-needed basis and I have given the patient examples of both, and then return to see our NP to generate a  detailed  medication calendar which should be followed until the next physician sees the patient and updates it.    The proper method of use, as well as anticipated side effects, of a metered-dose inhaler are discussed and demonstrated to the patient. Improved effectiveness after extensive coaching during this visit to a level of approximately  50% so if not achieving better technique then need to consider albuterol neb as a prn to back up albuterol hfa  See instructions for specific recommendations which were reviewed directly with the patient who was given a copy with highlighter outlining the key components.

## 2011-06-24 NOTE — Patient Instructions (Addendum)
Be sure you increase your budesonide to twice daily  Taper off prednisone as planned  For cough, use mucinex dm max dose is 1200mg  every 12 hours.  Only use your albuterol (ventolin hfa = gray puffer every 4 hours) as a rescue medication to be used if you can't catch your breath by resting or doing a relaxed purse lip breathing pattern. The less you use it, the better it will work when you need it.   See Tammy NP w/in 2 weeks with all your medications, even over the counter meds, separated in two separate bags, the ones you take no matter what vs the ones you stop once you feel better and take only as needed when you feel you need them.   Tammy  will generate for you a new user friendly medication calendar that will put [place] all on the same page re: your medication use.     Without this process, it simply isn't possible to assure that we are providing  your outpatient care  with  the attention to detail we feel you deserve.   If we cannot assure that you're getting that kind of care,  then we cannot manage your problem effectively from this clinic.  Once you have seen Tammy and we are sure that we're all on the same page with your medication use she will arrange follow up with me.

## 2011-06-24 NOTE — Progress Notes (Signed)
Subjective:     Patient ID: Katie Bryant, female   DOB: 07-20-1928    MRN: [id number]  HPI  47 yowf quit smoking 1992 with GOLD III copd dx 2009  followed in [facility] p hospitalized at [facility]   Admit date: 12/21/2010  Discharge date: 12/24/2010  C O P D  ALLERGIC RHINITIS  DYSPNEA  G E R D  Aortic stenosis  Insomnia  Leukocytosis      Hospital Course:  78 y/o with a h/o COPD with prior hospitalizations for COPD exacerbation. That presented to the hospital with c/o increase in cough, sputum production, and dyspnea. Pt was treated for COPD exacerbation. Initial chest x ray showed left lung pneumonia. Pt improved on IV steroids, antibiotics, and duonebs.   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 Uses multiple forms of albuterol. Cough is better, sputum mucoid.  >no changes, med rec next step   02/03/11 Follow up and med review Pt returns for follow up and med review.  We reviewed all her meds and organized them into a med calendar with pt education .  She appears to be taking her meds correctly.  She has returned back to her baseline from recent PNA episode last month. CXR c/w complete clearance   rec follow med calendar   03/11/2011 f/u ov/Errika Narvaiz cc ok x nose bleeds ? On 02 has calendar but not using it, ad libbing on 02 but overall feels making slow progress with return to nl activity tol and really no limiting sob or cough or need for daytime 02 rec Ok to leave 02 off at rest    06/20/2011 f/u ov/Jasher Barkan cc 2-3 weeks  increased cough, and sneezing > mucus turned thick yellow rx abx and steroids x 2 some better but very confused with meds/ names/ not following action plans at bottom of calendar.  Sleeping poorly with nocturnal  And  early am exacerbation  of respiratory  c/o's and  need for noct saba. rec Prednisone 10 mg take  4 each am x 2  days,   2 each am x 2 days,  1 each am x2days and stop  Add pepcid 20 mg one at bedtime (Zantac/ranitidine) 150 mg) one at bedtime GERD diet reviewed Flutter valve should help bring up mucus - use it as much as your want Keep Follow up [facility] office Tuesday June 18  Late add: Clarify 02 use for problem list at next ov    06/24/2011 f/u ov/Jaquez Farrington cc breathing some better but thoroughly confused with instructions/ names of meds/ lost calendar we reviwed just 4 days  prior to OV  - husband came with her today also confused with details of care.  Cough much looser with flutter, producing less thick white mucus only,  not sure which mucinex product she's using, hfa technique quite poor. Did not follow instructions on adding H2  at bedtime "using an acid reducer as needed"   Denies any obvious fluctuation of symptoms with weather or environmental changes or other aggravating or alleviating factors except as outlined above   ROS  At present neg for  any significant sore throat, dysphagia, itching,   fever, chills, sweats, unintended wt loss, pleuritic or exertional cp, hempoptysis, orthopnea pnd or leg swelling.  Also denies presyncope, palpitations, heartburn, abdominal pain, nausea, vomiting, diarrhea  or change in bowel or urinary habits, dysuria,hematuria,  rash, arthralgias, visual complaints, headache, numbness weakness or ataxia.      Objective:   Physical Exam Pleasant but anxious amb wf nad Wt 184 01/21/2011 >187 02/03/11 > 184 03/11/2011 > 184 06/20/2011 > 06/24/2011  183 HEENT mild turbinate edema.  Oropharynx no thrush or excess pnd or cobblestoning.  No JVD or cervical adenopathy. Mild accessory muscle hypertrophy. Trachea midline, nl thryroid. Chest was hyperinflated by percussion with diminished breath sounds Regular rate and rhythm without murmur gallop or rub or increase P2 or edema.  Abd: no hsm, nl excursion. Ext warm without cyanosis or clubbing.       CXR 02/03/11  Interval resolution  of the previously seen infiltrates/edema  with stable chronic changes.    Assessment:         Plan:

## 2011-07-08 ENCOUNTER — Ambulatory Visit (INDEPENDENT_AMBULATORY_CARE_PROVIDER_SITE_OTHER): Payer: Medicare Other | Admitting: Adult Health

## 2011-07-08 ENCOUNTER — Encounter: Payer: Self-pay | Admitting: Adult Health

## 2011-07-08 VITALS — BP 128/62 | HR 63 | Temp 97.6°F | Ht 66.0 in | Wt 186.8 lb

## 2011-07-08 DIAGNOSIS — J449 Chronic obstructive pulmonary disease, unspecified: Secondary | ICD-10-CM

## 2011-07-08 DIAGNOSIS — J4489 Other specified chronic obstructive pulmonary disease: Secondary | ICD-10-CM

## 2011-07-08 MED ORDER — BUDESONIDE 0.5 MG/2ML IN SUSP: 0.5000 mg | Freq: Two times a day (BID) | RESPIRATORY_TRACT | Status: DC

## 2011-07-08 MED ORDER — IPRATROPIUM-ALBUTEROL 0.5-2.5 (3) MG/3ML IN SOLN: 3.0000 mL | Freq: Four times a day (QID) | RESPIRATORY_TRACT | Status: DC

## 2011-07-08 NOTE — Patient Instructions (Addendum)
Follow med calendar closely and bring to each visit.  follow up Dr. Sherene Sires  In August at [facility] and As needed

## 2011-07-09 NOTE — Assessment & Plan Note (Signed)
Compensated on present regimen  Patient's medications were reviewed today and patient education was given. Computerized medication calendar was adjusted/completed    

## 2011-07-09 NOTE — Progress Notes (Signed)
Subjective:     Patient ID: Katie Bryant, female   DOB: [birth year]    MRN: [id number]  HPI  38 yowf quit smoking 1992 with GOLD III copd dx 2009  followed in [facility] p hospitalized at [facility]   Admit date: 12/21/2010  Discharge date: 12/24/2010  C O P D  ALLERGIC RHINITIS  DYSPNEA  G E R D  Aortic stenosis  Insomnia  Leukocytosis      Hospital Course:  78 y/o with a h/o COPD with prior hospitalizations for COPD exacerbation. That presented to the hospital with c/o increase in cough, sputum production, and dyspnea. Pt was treated for COPD exacerbation. Initial chest x ray showed left lung pneumonia. Pt improved on IV steroids, antibiotics, and duonebs.   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 Uses multiple forms of albuterol. Cough is better, sputum mucoid.  >no changes, med rec next step   02/03/11 Follow up and med review Pt returns for follow up and med review.  We reviewed all her meds and organized them into a med calendar with pt education .  She appears to be taking her meds correctly.  She has returned back to her baseline from recent PNA episode last month. CXR c/w complete clearance   rec follow med calendar   03/11/2011 f/u ov/Wert cc ok x nose bleeds ? On 02 has calendar but not using it, ad libbing on 02 but overall feels making slow progress with return to nl activity tol and really no limiting sob or cough or need for daytime 02 rec Ok to leave 02 off at rest    06/20/2011 f/u ov/Wert cc 2-3 weeks  increased cough, and sneezing > mucus turned thick yellow rx abx and steroids x 2 some better but very confused with meds/ names/ not following action plans at bottom of calendar.  Sleeping poorly with nocturnal  And  early am exacerbation  of respiratory  c/o's and  need for noct saba. rec Prednisone 10 mg take  4 each am x 2  days,   2 each am x 2 days,  1 each am x2days and stop  Add pepcid 20 mg one at bedtime (Zantac/ranitidine) 150 mg) one at bedtime GERD diet reviewed Flutter valve should help bring up mucus - use it as much as your want Keep Follow up [facility] office Tuesday June 18  Late add: Clarify 02 use for problem list at next ov    06/24/2011 f/u ov/Wert cc breathing some better but thoroughly confused with instructions/ names of meds/ lost calendar we reviwed just 4 days  prior to OV  - husband came with her today also confused with details of care.  Cough much looser with flutter, producing less thick white mucus only,  not sure which mucinex product she's using, hfa technique quite poor. Did not follow instructions on adding H2  at bedtime "using an acid reducer as needed"  >>budesonide Twice daily  And taper steroids to off   07/09/2011 Follow up and Med review  Returns after recent COPD flare -now resolved and back to her baseline  We reviewed all her meds and organized them into a med calendar w/ pt edcuation  Recent changes were reviewed.  No hemotpytis or chest pain  No edema.     ROS  Constitutional:  No  weight loss, night sweats,  Fevers, chills, + fatigue, or  lassitude.  HEENT:   No headaches,  Difficulty swallowing,  Tooth/dental problems, or  Sore throat,                No sneezing, itching, ear ache,  +nasal congestion, post nasal drip,   CV:  No chest pain,  Orthopnea, PND, swelling in lower extremities, anasarca, dizziness, palpitations, syncope.   GI  No heartburn, indigestion, abdominal pain, nausea, vomiting, diarrhea, change in bowel habits, loss of appetite, bloody stools.   Resp:  ,  No coughing up of blood.     No chest wall deformity  Skin: no rash or lesions.  GU: no dysuria, change in color of urine, no urgency or frequency.  No flank pain, no hematuria   MS:  No joint pain or swelling.  No decreased range of motion.  No back pain.  Psych:  No change in mood or  affect. No depression or anxiety.  No memory loss.           Objective:   Physical Exam [place] amb wf nad Wt 184 01/21/2011 >187 02/03/11 > 184 03/11/2011 > 184 06/20/2011 > 06/24/2011  183> 186 7/2  HEENT mild turbinate edema.  Oropharynx no thrush or excess pnd or cobblestoning.  No JVD or cervical adenopathy. Mild accessory muscle hypertrophy. Trachea midline, nl thryroid. Chest was hyperinflated by percussion with diminished breath sounds Regular rate and rhythm without murmur gallop or rub or increase P2 or edema.  Abd: no hsm, nl excursion. Ext warm without cyanosis or clubbing.       CXR 02/03/11  Interval resolution of the previously seen infiltrates/edema  with stable chronic changes.    Assessment:         Plan:

## 2011-07-14 ENCOUNTER — Telehealth: Payer: Self-pay | Admitting: Internal Medicine

## 2011-07-14 MED ORDER — IPRATROPIUM-ALBUTEROL 0.5-2.5 (3) MG/3ML IN SOLN: 3.0000 mL | Freq: Four times a day (QID) | RESPIRATORY_TRACT | Status: DC

## 2011-07-14 MED ORDER — BUDESONIDE 0.5 MG/2ML IN SUSP: 0.5000 mg | Freq: Two times a day (BID) | RESPIRATORY_TRACT | Status: DC

## 2011-07-14 NOTE — Telephone Encounter (Signed)
RX's have been printed out and placed in MW look at to fax to [facility] once signed. Please advise Dr. Sherene Sires, thanks

## 2011-07-16 NOTE — Telephone Encounter (Signed)
RX signed by MW and faxed to [place]. Patient notified.

## 2011-07-16 NOTE — Telephone Encounter (Signed)
RX placed back in MW look at. Pls sign both prescriptions. Thx.

## 2011-07-23 ENCOUNTER — Ambulatory Visit (INDEPENDENT_AMBULATORY_CARE_PROVIDER_SITE_OTHER): Payer: Medicare Other | Admitting: Cardiology

## 2011-07-23 ENCOUNTER — Encounter: Payer: Self-pay | Admitting: Cardiology

## 2011-07-23 VITALS — BP 120/60 | HR 72 | Ht 66.0 in | Wt 185.0 lb

## 2011-07-23 DIAGNOSIS — E669 Obesity, unspecified: Secondary | ICD-10-CM

## 2011-07-23 DIAGNOSIS — I10 Essential (primary) hypertension: Secondary | ICD-10-CM

## 2011-07-23 DIAGNOSIS — E785 Hyperlipidemia, unspecified: Secondary | ICD-10-CM

## 2011-07-23 DIAGNOSIS — I350 Nonrheumatic aortic (valve) stenosis: Secondary | ICD-10-CM

## 2011-07-23 DIAGNOSIS — I359 Nonrheumatic aortic valve disorder, unspecified: Secondary | ICD-10-CM

## 2011-07-23 NOTE — Progress Notes (Signed)
HPI The patient presents for followup of aortic stenosis.  Today she reported a story dating back 9 years. She said at one point she developed a sudden burst of "fireworks" in her head. This was not sensation that has been followed over the years by dizziness. She describes the dizziness is positional particularly when she's lying down and turns her head in a certain way. She does not describe palpitations, presyncope or syncope. She has had no chest pressure, neck or arm discomfort. She doesn't describe PND or orthopnea. She's on chronic oxygen for chronic lung disease. She said recently she was treated with Mucinex and this seemed to exacerbate the symptoms in her head. She says she hears a constant buzzing or "cricket" sound that is made much worse by the Mucinex. She's not having any cough that is new though she has some chronic cough. She's not having any fevers or chills.  Allergies  Allergen Reactions  . Other     STEROIDS, pt staes she blows up and gains weight    Current Outpatient Prescriptions  Medication Sig Dispense Refill  . acetaminophen (TYLENOL) 500 MG tablet Take 1 tablet (500 mg total) by mouth every 6 (six) hours as needed for pain.      Marland Kitchen albuterol (VENTOLIN HFA) 108 (90 BASE) MCG/ACT inhaler Inhale 2 puffs into the lungs every 6 (six) hours as needed.  1 Inhaler  11  . ALPRAZolam (XANAX) 0.5 MG tablet Take 1 tablet (0.5 mg total) by mouth 2 (two) times daily as needed for anxiety. For anxiety/sleep  60 tablet  0  . budesonide (PULMICORT) 0.5 MG/2ML nebulizer solution Take 2 mLs (0.5 mg total) by nebulization 2 (two) times daily.  120 mL  11  . buPROPion (WELLBUTRIN SR) 150 MG 12 hr tablet Take 150 mg by mouth daily.      . famotidine (PEPCID) 20 MG tablet Take 1 tablet (20 mg total) by mouth at bedtime as needed (for reflux).      . fenofibrate micronized (LOFIBRA) 134 MG capsule TAKE ONE CAPSULE BY MOUTH EVERY DAY  30 capsule  6  . guaiFENesin (MUCINEX) 600 MG 12 hr  tablet Take 1 tablet (600 mg total) by mouth 2 (two) times daily. For congestion      . ipratropium-albuterol (DUONEB) 0.5-2.5 (3) MG/3ML SOLN Take 3 mLs by nebulization 4 (four) times daily.  360 mL  11  . meclizine (ANTIVERT) 25 MG tablet Take 1 tablet (25 mg total) by mouth every 8 (eight) hours as needed for dizziness. For dizziness      . metoprolol tartrate (LOPRESSOR) 25 MG tablet Take 0.5 tablets (12.5 mg total) by mouth 2 (two) times daily.  90 tablet  3  . Respiratory Therapy Supplies (FLUTTER) DEVI Use as directed  1 each  0  . simvastatin (ZOCOR) 20 MG tablet Take 1 tablet (20 mg total) by mouth at bedtime.  30 tablet  11  . sodium chloride (OCEAN NASAL SPRAY) 0.65 % nasal spray 2 puffs every 4-6 hours as needed for nasal congestion      . temazepam (RESTORIL) 30 MG capsule Take 1 capsule (30 mg total) by mouth at bedtime as needed for sleep.      . valsartan-hydrochlorothiazide (DIOVAN HCT) 80-12.5 MG per tablet Take 1 tablet by mouth daily.  90 tablet  2    Past Medical History  Diagnosis Date  . COPD (chronic obstructive pulmonary disease)   . Oxygen dependent   . Aortic stenosis,  mild   . Obesity   . Hypertension   . Anxiety   . SOB (shortness of breath) on exertion   . Back pain   . Headache   . Breast cancer   . Asthma   . Sleep apnea   . DEMENTIA   . GERD (gastroesophageal reflux disease)   . Arthritis   . Pneumonia   . H/O hiatal hernia   . Depression     Past Surgical History  Procedure Date  . Cardiac catheterization     EF 55-60%  . Tonsillectomy and adenoidectomy 1942  . Breast reconstruction   . Appendectomy   . Mastectomy 1980    ROS:  As stated in the HPI and negative for all other systems.  PHYSICAL EXAM BP 120/60  Pulse 72  Ht 5\' 6"  (1.676 m)  Wt 185 lb (83.915 kg)  BMI 29.86 kg/m2 GENERAL:  Well appearing HEENT:  Pupils equal round and reactive, fundi not visualized, oral mucosa unremarkable NECK:  No jugular venous distention,  waveform within normal limits, carotid upstroke brisk and symmetric, no bruits, no thyromegaly LYMPHATICS:  No cervical, inguinal adenopathy LUNGS:  Decreased breath sounds with no wheezing or crackles BACK:  No CVA tenderness CHEST:  Unremarkable HEART:  PMI not displaced or sustained,S1 and S2 within normal limits, no S3, no S4, no clicks, no rubs, 2/6 systolic murmur her best at the left upper sternal border,  Very diminished heart sounds ABD:  Flat, positive bowel sounds normal in frequency in pitch, no bruits, no rebound, no guarding, no midline pulsatile mass, no hepatomegaly, no splenomegaly EXT:  2 plus pulses throughout, no edema, no cyanosis no clubbing SKIN:  No rashes no nodules NEURO:  Cranial nerves II through XII grossly intact, motor grossly intact throughout PSYCH:  Cognitively intact, oriented to person place and time  EKG:  Sinus rhythm, rate 72, left axis deviation, left anterior fascicular block, poor anterior R wave progression, no acute ST-T wave changes.  07/23/2011   ASSESSMENT AND PLAN  Aortic stenosis - She has a very difficult exam with distant heart sounds. It has been 6 years since her last ultrasound. Her AS was mild at that time. I will repeat the echo to further quantify.  HTN (hypertension) -  Her blood pressure is well controlled and she will continue the meds as listed.  Hyperlipidemia -  Her last HDL was 62 with an LDL of 161 2/13. No change in therapy is indicated.

## 2011-07-23 NOTE — Patient Instructions (Addendum)
The current medical regimen is effective;  continue present plan and medications.  Your physician has requested that you have an echocardiogram. This testing will be completed at the [facility] office.  Echocardiography is a painless test that uses sound waves to create images of your heart. It provides your doctor with information about the size and shape of your heart and how well your heart's chambers and valves are working. This procedure takes approximately one hour. There are no restrictions for this procedure.  This is scheduled for July 30, 2011 at 11 am.  Follow up in 1 year with Dr Antoine Poche in [place].  You will receive a letter in the mail 2 months before you are due.  Please call us when you receive this letter to schedule your follow up appointment.

## 2011-07-30 ENCOUNTER — Other Ambulatory Visit (INDEPENDENT_AMBULATORY_CARE_PROVIDER_SITE_OTHER): Payer: Medicare Other

## 2011-07-30 ENCOUNTER — Other Ambulatory Visit: Payer: Self-pay

## 2011-07-30 DIAGNOSIS — I359 Nonrheumatic aortic valve disorder, unspecified: Secondary | ICD-10-CM

## 2011-07-30 DIAGNOSIS — I350 Nonrheumatic aortic (valve) stenosis: Secondary | ICD-10-CM

## 2011-08-07 ENCOUNTER — Telehealth: Payer: Self-pay | Admitting: Internal Medicine

## 2011-08-07 NOTE — Telephone Encounter (Signed)
I spoke with the pt and she is confused about her meds. She states she was supposed to get her neb meds through [facility] and they were supposed to be cheaper, but she states her daughter delivered the meds to her from [place]. She also states that she received a bill from [place] for [phone] and states this is not any cheaper then [facility] for the meds. I called [facility] and spoke with Marcelino Duster and she states their records show that they received the rx for neb meds but the pt told them she was receiving this from [facility] so not to send it so they never shipped the meds to her. The bill is for the pt oxygen not meds. I called the pt and advised of the above and she states she is going to call [facility] to discuss the the bill before she wants the rx resent to [facility] for her neb meds. Pt states she will call back when she gets it all straight.Carron Curie, CMA

## 2011-08-12 ENCOUNTER — Encounter: Payer: Self-pay | Admitting: Internal Medicine

## 2011-08-12 ENCOUNTER — Ambulatory Visit (INDEPENDENT_AMBULATORY_CARE_PROVIDER_SITE_OTHER): Payer: Medicare Other | Admitting: Internal Medicine

## 2011-08-12 VITALS — BP 148/70 | HR 60 | Temp 97.7°F | Ht 66.0 in | Wt 186.0 lb

## 2011-08-12 DIAGNOSIS — J449 Chronic obstructive pulmonary disease, unspecified: Secondary | ICD-10-CM

## 2011-08-12 DIAGNOSIS — J310 Chronic rhinitis: Secondary | ICD-10-CM

## 2011-08-12 DIAGNOSIS — J9610 Chronic respiratory failure, unspecified whether with hypoxia or hypercapnia: Secondary | ICD-10-CM

## 2011-08-12 NOTE — Patient Instructions (Addendum)
I am very worried about you and how you take your medications  I have done everything I can to help you with this including seeing my nurse practitioner to help organize your medications using checklist format.   See Tammy NP w/in 6 weeks with all your medications, even over the counter meds, separated in two separate bags, the ones you take no matter what - theses are the ones that belong in pill boxes which you should bring with you - vs the ones you stop once you feel better and take only as needed when you feel you need them.   Tammy  will generate for you a new user friendly medication calendar that will put [place] all on the same page re: your medication use.     Without this process, it simply isn't possible to assure that we are providing  your outpatient care  with  the attention to detail we feel you deserve.   If we cannot assure that you're getting that kind of care,  then we cannot manage your problem effectively from this clinic.  Once you have seen Tammy and we are sure that we're all on the same page with your medication use she will arrange follow up with me in [place].

## 2011-08-12 NOTE — Progress Notes (Addendum)
Subjective:     Patient ID: Katie Bryant, female   DOB: 1928-03-13    MRN: [id number]  HPI  58 yowf quit smoking 1992 with GOLD III copd dx 2009  followed in [facility] p hospitalized at [facility]   Admit date: 12/21/2010  Discharge date: 12/24/2010  C O P D  ALLERGIC RHINITIS  DYSPNEA  G E R D  Aortic stenosis  Insomnia  Leukocytosis      Hospital Course:  78 y/o with a h/o COPD with prior hospitalizations for COPD exacerbation. That presented to the hospital with c/o increase in cough, sputum production, and dyspnea. Pt was treated for COPD exacerbation. Initial chest x ray showed left lung pneumonia. Pt improved on IV steroids, antibiotics, and duonebs.   01/21/2011 f/u ov/Tanna Loeffler cc  baseline doe [name] off 02 and on 02 can do the whole store s stopping at 3lpm but discharged on 4lpm does not wear it 24 hours 25% of the time. Uses multiple forms of albuterol. Cough is better, sputum mucoid.  >no changes, med rec next step   02/03/11 Follow up and med review Pt returns for follow up and med review.  We reviewed all her meds and organized them into a med calendar with pt education .  She appears to be taking her meds correctly.  She has returned back to her baseline from recent PNA episode last month. CXR c/w complete clearance   rec follow med calendar   03/11/2011 f/u ov/Toyia Jelinek cc ok x nose bleeds ? On 02 has calendar but not using it, ad libbing on 02 but overall feels making slow progress with return to nl activity tol and really no limiting sob or cough or need for daytime 02 rec Ok to leave 02 off at rest    06/20/2011 f/u ov/Cynai Skeens cc 2-3 weeks  increased cough, and sneezing > mucus turned thick yellow rx abx and steroids x 2 some better but very confused with meds/ names/ not following action plans at bottom of calendar.  Sleeping poorly with nocturnal  And  early am exacerbation  of respiratory  c/o's and  need for noct saba. rec Prednisone 10 mg take  4 each am x 2  days,   2 each am x 2 days,  1 each am x2days and stop  Add pepcid 20 mg one at bedtime (Zantac/ranitidine) 150 mg) one at bedtime GERD diet reviewed Flutter valve should help bring up mucus - use it as much as your want Keep Follow up [facility] office Tuesday June 18  Late add: Clarify 02 use for problem list at next ov    06/24/2011 f/u ov/Lizmarie Witters cc breathing some better but thoroughly confused with instructions/ names of meds/ lost calendar we reviwed just 4 days  prior to OV  - husband came with her today also confused with details of care.  Cough much looser with flutter, producing less thick white mucus only,  not sure which mucinex product she's using, hfa technique quite poor. Did not follow instructions on adding H2  at bedtime "using an acid reducer as needed"  >>budesonide Twice daily  And taper steroids to off   07/09/2011 Follow up and Med review  Returns after recent COPD flare -now resolved and back to her baseline  We reviewed all her meds and organized them into a med calendar w/ pt edcuation  Recent changes were reviewed.  rec Follow med calendar closely and bring to each visit   08/12/2011 f/u ov/Yemariam Magar  cc breathing about the same, minimal cough, thoroughly confused again with instructions and meds, recognizes the calendar we gave her and remembers receiving it but didn't bring it and not using it.   No unusual cough, purulent sputum or HB symptoms on present rx. Does have chronic nasal obst symptoms esp at hs takes otc doesn't know name.    Sleeping ok without nocturnal  or early am exacerbation  of respiratory  c/o's or need for noct saba. Also denies any obvious fluctuation of symptoms with weather or environmental changes or other aggravating or alleviating factors except as outlined above .  No change doe  ROS  The following are not active complaints unless bolded sore throat, dysphagia, dental problems, itching, sneezing,  nasal congestion or excess/ purulent secretions, ear  ache,   fever, chills, sweats, unintended wt loss, pleuritic or exertional cp, hemoptysis,  orthopnea pnd or leg swelling, presyncope, palpitations, heartburn, abdominal pain, anorexia, nausea, vomiting, diarrhea  or change in bowel or urinary habits, change in stools or urine, dysuria,hematuria,  rash, arthralgias, visual complaints, headache, numbness weakness or ataxia or problems with walking or coordination,  change in mood/affect or memory.                  Objective:   Physical Exam Pleasan amb wf nad Wt 184 01/21/2011 >187 02/03/11 > 184 03/11/2011 > 184 06/20/2011 > 08/12/2011   186 HEENT mild turbinate edema.  Oropharynx no thrush or excess pnd or cobblestoning.  No JVD or cervical adenopathy. Mild accessory muscle hypertrophy. Trachea midline, nl thryroid. Chest was hyperinflated by percussion with diminished breath sounds Regular rate and rhythm without murmur gallop or rub or increase P2 or edema.  Abd: no hsm, nl excursion. Ext warm without cyanosis or clubbing.       CXR 02/03/11  Interval resolution of the previously seen infiltrates/edema  with stable chronic changes.    Assessment:         Plan:

## 2011-08-12 NOTE — Assessment & Plan Note (Signed)
-   PFT's 06/11/2007  FEV1   0.70 (34%) ratio 32 with 49% improvement p B2    - HFA 75% effective p coaching 06/20/2011 > 50% 06/24/2011  -med calendar 02/03/11  > not using correctly 06/20/11 > not using at all 06/24/2011 , 07/08/2011, 08/12/2011  At least a GOLD III with high risk of exac and very poor insight into meds born more from stubbornness than anything else.  I had an extended discussion with the patient and her husband today lasting 15 to 20 minutes of a 25 minute visit on the following issues:   No changes in meds needed but in order to assure quality outpatient care through this office, it's critical we do accurate and meaningful med reconciliation with each ov.  If not willing to do this, I will not be willing to continue to assume responsibility for her outpt care.   To keep things simple, I have asked the patient to first separate medicines that are perceived as maintenance, that is to be taken daily "no matter what", from those medicines that are taken on only on an as-needed basis and I have given the patient examples of both, and then return to see our NP to generate a  detailed  medication calendar which should be followed until the next physician sees the patient and updates it.

## 2011-08-12 NOTE — Assessment & Plan Note (Signed)
Followed by ENT/ Jearld Fenton  - on rx otc but does not know the name, asked her to bring this with her at next ov to verify and in meantime f/u with Jearld Fenton prn

## 2011-08-12 NOTE — Assessment & Plan Note (Signed)
-   sats 90% RA 03/11/2011     - Dr Young started 02 around 2010    - 3 lpm at hs, 3lpm with activity, as needed at rest  Adequate control on present rx, reviewed  

## 2011-08-18 ENCOUNTER — Other Ambulatory Visit: Payer: Self-pay | Admitting: Internal Medicine

## 2011-08-20 ENCOUNTER — Telehealth: Payer: Self-pay | Admitting: Internal Medicine

## 2011-08-20 NOTE — Telephone Encounter (Signed)
Ok to refill 

## 2011-08-20 NOTE — Telephone Encounter (Signed)
I spoke with pt and she is requesting a refill on her xanax 0.5 mg 1 po BID. Last refilled 06/20/11 #60 x 0 refills. Pt last OV 08/12/11 and has OV w/ TP on 08/25/11. Pt aware MW is currently out of the office. Pt was fine with a call back tomorrow. Please advise Dr. Sherene Sires thanks

## 2011-08-21 MED ORDER — ALPRAZOLAM 0.5 MG PO TABS: 0.5000 mg | ORAL_TABLET | Freq: Two times a day (BID) | ORAL | Status: DC | PRN

## 2011-08-21 NOTE — Telephone Encounter (Signed)
I have called refill into the pharmacy for pt. Pt is aware of this. Also pt stated she has been taking mucnex since being out of the hospital. It has now begun to cause her to have "roary noise" in her head. When she does not take the mucinex she does not feel like this. She is taking mucinex 1 po BID. She is wanting to know what else she can take to keep from getting congestion in her chest. Please advise Dr. Sherene Sires thanks

## 2011-08-21 NOTE — Telephone Encounter (Signed)
No other expectorants on market, regroup next ov and see Tammy sooner if can't tolerate "congestion"

## 2011-08-21 NOTE — Telephone Encounter (Signed)
Called spoke with patient, advised of MW's recs.  Pt stated that she will try to "back off" on her mucinex and perhaps try a claritin-d as she has sinus congestion as well.  Advised pt to keep an eye on her BP since the claritin-d has a decongestant in it.  Pt verbalized her understanding and will keep her 08-25-11 med cal appt w/ TP, to call sooner if needed.  Will sign off.

## 2011-08-25 ENCOUNTER — Encounter: Payer: Medicare Other | Admitting: Adult Health

## 2011-08-27 ENCOUNTER — Ambulatory Visit (INDEPENDENT_AMBULATORY_CARE_PROVIDER_SITE_OTHER): Payer: Medicare Other | Admitting: Adult Health

## 2011-08-27 ENCOUNTER — Encounter: Payer: Self-pay | Admitting: Adult Health

## 2011-08-27 VITALS — BP 138/70 | HR 64 | Temp 98.2°F | Ht 64.0 in | Wt 186.8 lb

## 2011-08-27 DIAGNOSIS — J449 Chronic obstructive pulmonary disease, unspecified: Secondary | ICD-10-CM

## 2011-08-27 NOTE — Progress Notes (Signed)
Subjective:     Patient ID: Katie Bryant, female   DOB: 1928-12-29    MRN: [id number]  HPI 61 yowf quit smoking 1992 with GOLD III copd dx 2009  followed in [place] p hospitalized at [facility]   Admit date: 12/21/2010  Discharge date: 12/24/2010  C O P D  ALLERGIC RHINITIS  DYSPNEA  G E R D  Aortic stenosis  Insomnia  Leukocytosis      Hospital Course:  78 y/o with a h/o COPD with prior hospitalizations for COPD exacerbation. That presented to the hospital with c/o increase in cough, sputum production, and dyspnea. Pt was treated for COPD exacerbation. Initial chest x ray showed left lung pneumonia. Pt improved on IV steroids, antibiotics, and duonebs.   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 Uses multiple forms of albuterol. Cough is better, sputum mucoid.  >no changes, med rec next step   02/03/11 Follow up and med review Pt returns for follow up and med review.  We reviewed all her meds and organized them into a med calendar with pt education .  She appears to be taking her meds correctly.  She has returned back to her baseline from recent PNA episode last month. CXR c/w complete clearance   rec follow med calendar   03/11/2011 f/u ov/Wert cc ok x nose bleeds ? On 02 has calendar but not using it, ad libbing on 02 but overall feels making slow progress with return to nl activity tol and really no limiting sob or cough or need for daytime 02 rec Ok to leave 02 off at rest    06/20/2011 f/u ov/Wert cc 2-3 weeks  increased cough, and sneezing > mucus turned thick yellow rx abx and steroids x 2 some better but very confused with meds/ names/ not following action plans at bottom of calendar.  Sleeping poorly with nocturnal  And  early am exacerbation  of respiratory  c/o's and  need for noct saba. rec Prednisone 10 mg take  4 each am x 2  days,   2 each am x 2 days,  1 each am x2days and stop  Add pepcid 20 mg one at bedtime (Zantac/ranitidine) 150 mg) one at bedtime GERD diet reviewed Flutter valve should help bring up mucus - use it as much as your want Keep Follow up [facility] office Tuesday June 18  Late add: Clarify 02 use for problem list at next ov    06/24/2011 f/u ov/Wert cc breathing some better but thoroughly confused with instructions/ names of meds/ lost calendar we reviwed just 4 days  prior to OV  - husband came with her today also confused with details of care.  Cough much looser with flutter, producing less thick white mucus only,  not sure which mucinex product she's using, hfa technique quite poor. Did not follow instructions on adding H2  at bedtime "using an acid reducer as needed"  >>budesonide Twice daily  And taper steroids to off   07/09/2011 Follow up and Med review  Returns after recent COPD flare -now resolved and back to her baseline  We reviewed all her meds and organized them into a med calendar w/ pt edcuation  Recent changes were reviewed.  rec Follow med calendar closely and bring to each visit   08/12/2011 f/u ov/Wert cc  breathing about the same, minimal cough, thoroughly confused again with instructions and meds, recognizes the calendar we gave her and remembers receiving it but didn't bring it and not using it.   No unusual cough, purulent sputum or HB symptoms on present rx. Does have chronic nasal obst symptoms esp at hs takes otc doesn't know name.  >>no changes   08/27/2011    Follow up and med calendar  We reviewed all her meds and organized them into a med calendar w/ pt edcuation  She says she got her old med calendar at last ov b/c she was in a hurry Have suggested she throw away old forms if not needed.  She appears to be taking meds correctly Says she is doing well. No flare in dyspnea or cough No fever or chest pain.     ROS:  Constitutional:   No  weight loss, night sweats,   Fevers, chills, +fatigue, or  lassitude.  HEENT:   No headaches,  Difficulty swallowing,  Tooth/dental problems, or  Sore throat,                No sneezing, itching, ear ache, nasal congestion, post nasal drip,   CV:  No chest pain,  Orthopnea, PND, swelling in lower extremities, anasarca, dizziness, palpitations, syncope.   GI  No heartburn, indigestion, abdominal pain, nausea, vomiting, diarrhea, change in bowel habits, loss of appetite, bloody stools.   Resp:   No coughing up of blood.  No change in color of mucus.  No wheezing.  No chest wall deformity  Skin: no rash or lesions.  GU: no dysuria, change in color of urine, no urgency or frequency.  No flank pain, no hematuria   MS:  No joint pain or swelling.  No decreased range of motion.     Psych:  No change in mood or affect. No depression or anxiety.  No memory loss.                  Objective:   Physical Exam Pleasan amb wf nad Wt 184 01/21/2011 >187 02/03/11 > 184 03/11/2011 > 184 06/20/2011 > 08/12/2011   186> 08/27/2011 186  HEENT mild turbinate edema.  Oropharynx no thrush or excess pnd or cobblestoning.  No JVD or cervical adenopathy. Mild accessory muscle hypertrophy. Trachea midline, nl thryroid. Chest was hyperinflated by percussion with diminished breath sounds Regular rate and rhythm without murmur gallop or rub or increase P2 or edema.  Abd: no hsm, nl excursion. Ext warm without cyanosis or clubbing.       CXR 02/03/11  Interval resolution of the previously seen infiltrates/edema  with stable chronic changes.    Assessment:         Plan:

## 2011-08-27 NOTE — Assessment & Plan Note (Signed)
Doing well , compensated on present regimen.  Patient's medications were reviewed today and patient education was given. Computerized medication calendar was adjusted/completed

## 2011-08-27 NOTE — Patient Instructions (Addendum)
Follow med calendar closely and bring to each visit.  Follow up Dr. Sherene Sires  As planned  [facility]  clinic in 1 month and As needed

## 2011-09-03 ENCOUNTER — Ambulatory Visit (INDEPENDENT_AMBULATORY_CARE_PROVIDER_SITE_OTHER): Payer: Medicare Other | Admitting: Cardiology

## 2011-09-03 DIAGNOSIS — R0989 Other specified symptoms and signs involving the circulatory and respiratory systems: Secondary | ICD-10-CM

## 2011-09-03 NOTE — Addendum Note (Signed)
Addended by: Nita Sells on: 09/03/2011 04:29 PM   Modules accepted: Orders

## 2011-09-10 ENCOUNTER — Telehealth: Payer: Self-pay | Admitting: Internal Medicine

## 2011-09-10 MED ORDER — IPRATROPIUM-ALBUTEROL 0.5-2.5 (3) MG/3ML IN SOLN: 3.0000 mL | Freq: Four times a day (QID) | RESPIRATORY_TRACT | Status: DC

## 2011-09-10 NOTE — Telephone Encounter (Signed)
Pt informed that refill for Duoneb was sent to [facility] in [place].

## 2011-09-12 ENCOUNTER — Telehealth: Payer: Self-pay | Admitting: Internal Medicine

## 2011-09-12 NOTE — Telephone Encounter (Signed)
Called, spoke with pt.  She was seen by TP on 08/27/11 for med calander.  States Dr. Sherene Sires had her on Mucinex bid scheduled.  States when she seen TP mucinex was not placed on the med calander even though she had this medication with her.  Reports at the bottom of the list, there is robitussion to use prn for cough and congestion.  States she didn't even have robitussion with her.  C/o nonprod cough today with the robitusion.  States this did not happen while she was taking mucinex bid.  Requesting clarification on what she should be doing today.  Tammy, can you pls clarify this?  Thank you.

## 2011-09-12 NOTE — Telephone Encounter (Signed)
MAR updated by Marliss Czar (verified).  Called spoke with patient, informed her that med calendar is correct and verified her mailing address.  Updated med calendar placed in the mail.  Nothing further needed at this time; will sign off.

## 2011-09-12 NOTE — Telephone Encounter (Signed)
Please adjust MAR  And mail calendar  i printed new copy

## 2011-09-12 NOTE — Telephone Encounter (Signed)
Pt told me she wanted to take robitussium that is why I changed it.  Whichever she wants to take is fine.  Let her pick and then we can adjust MAR and calendar and give her an updated copy of calendar if changes need to be made

## 2011-09-12 NOTE — Telephone Encounter (Signed)
TP--i called and spoke with the pt and i have updated her med list the way she would like to take the mucinex and the robitussin.  Pt would like [place] to mail her an updated copy.  Pt would like to take the mucinex 1 tablet every morning and the robitussin at bedtime.  thanks

## 2011-09-23 ENCOUNTER — Other Ambulatory Visit: Payer: Self-pay | Admitting: Internal Medicine

## 2011-09-23 NOTE — Telephone Encounter (Signed)
Please advise if okay to refill alprazolam thanks 

## 2011-09-29 ENCOUNTER — Encounter: Payer: Self-pay | Admitting: Internal Medicine

## 2011-09-29 ENCOUNTER — Telehealth: Payer: Self-pay | Admitting: Internal Medicine

## 2011-09-29 ENCOUNTER — Ambulatory Visit (INDEPENDENT_AMBULATORY_CARE_PROVIDER_SITE_OTHER): Payer: Medicare Other | Admitting: Internal Medicine

## 2011-09-29 ENCOUNTER — Other Ambulatory Visit: Payer: Self-pay | Admitting: Internal Medicine

## 2011-09-29 VITALS — BP 104/58 | HR 80 | Temp 98.2°F | Ht 66.0 in | Wt 180.0 lb

## 2011-09-29 DIAGNOSIS — J449 Chronic obstructive pulmonary disease, unspecified: Secondary | ICD-10-CM

## 2011-09-29 DIAGNOSIS — J310 Chronic rhinitis: Secondary | ICD-10-CM

## 2011-09-29 DIAGNOSIS — J9610 Chronic respiratory failure, unspecified whether with hypoxia or hypercapnia: Secondary | ICD-10-CM

## 2011-09-29 MED ORDER — AZELASTINE-FLUTICASONE 137-50 MCG/ACT NA SUSP: 1.0000 | Freq: Two times a day (BID) | NASAL | Status: DC

## 2011-09-29 MED ORDER — IPRATROPIUM-ALBUTEROL 0.5-2.5 (3) MG/3ML IN SOLN: 3.0000 mL | Freq: Four times a day (QID) | RESPIRATORY_TRACT | Status: DC

## 2011-09-29 MED ORDER — AMOXICILLIN-POT CLAVULANATE 875-125 MG PO TABS: 1.0000 | ORAL_TABLET | Freq: Two times a day (BID) | ORAL | Status: DC

## 2011-09-29 MED ORDER — BUDESONIDE 0.5 MG/2ML IN SUSP: 0.5000 mg | Freq: Two times a day (BID) | RESPIRATORY_TRACT | Status: DC

## 2011-09-29 MED ORDER — PREDNISONE 10 MG PO TABS: ORAL_TABLET | ORAL | Status: DC

## 2011-09-29 NOTE — Assessment & Plan Note (Signed)
Adequate control on present rx, reviewed  

## 2011-09-29 NOTE — Assessment & Plan Note (Signed)
Acute flare with prev failure of fluticasone and now ? Sinusitis > rec short course augmentin and pred x 6 days and trial of dymista  See instructions for specific recommendations which were reviewed directly with the patient who was given a copy with highlighter outlining the key components.

## 2011-09-29 NOTE — Telephone Encounter (Signed)
Spoke with the pt and she is c/o productive cough with dark phlegm, increased SOB, sore throat all x 2 weeks. Pt is requesting an appt. Appt set for today at 11:30 with MW.Jennifer Yancey Flemings, CMA

## 2011-09-29 NOTE — Assessment & Plan Note (Signed)
-   PFT's 06/11/2007  FEV1   0.70 (34%) ratio 32 with 49% improvement p B2    - HFA 75% effective p coaching 06/20/2011 > 50% 06/24/2011  -med calendar 02/03/11  > not using correctly 06/20/11 > not using at all 06/24/2011 , 07/08/2011, 08/12/2011 , 08/27/2011  > using it 09/29/2011   Adequate control on present rx, reviewed despite exac with purulent rhinitis > bronchitis >   Each maintenance medication was reviewed in detail including most importantly the difference between maintenance and as needed and under what circumstances the prns are to be used. This was done in the context of a medication calendar review which provided the patient with a user-friendly unambiguous mechanism for medication administration and reconciliation and provides an action plan for all active problems. It is critical that this be shown to every doctor  for modification during the office visit if necessary so the patient can use it as a working document.

## 2011-09-29 NOTE — Patient Instructions (Addendum)
Augmentin 875 twice daily x 10 days  Prednisone 10 mg take  4 each am x 2 days,   2 each am x 2 days,  1 each am x2days and stop  Start Dymista one twice daily   Please see patient coordinator before you leave today  to schedule nebs refills through [facility]  See calendar for specific medication instructions and bring it back for each and every office visit for every healthcare provider you see.  Without it,  you may not receive the best quality medical care that we feel you deserve.  You will note that the calendar groups together  your maintenance  medications that are timed at particular times of the day.  Think of this as your checklist for what your doctor has instructed you to do until your next evaluation to see what benefit  there is  to staying on a consistent group of medications intended to keep you well.  The other group at the bottom is entirely up to you to use as you see fit  for specific symptoms that may arise between visits that require you to treat them on an as needed basis.  Think of this as your action plan or "what if" list.   Separating the top medications from the bottom group is fundamental to providing you adequate care going forward.

## 2011-09-29 NOTE — Progress Notes (Signed)
Subjective:     Patient ID: Katie Bryant, female   DOB: 1928/11/21    MRN: [id number]  HPI 65 yowf quit smoking 1992 with GOLD III copd dx 2009  followed in [facility] p hospitalized at [facility]   Admit date: 12/21/2010  Discharge date: 12/24/2010  C O P D  ALLERGIC RHINITIS  DYSPNEA  G E R D  Aortic stenosis  Insomnia  Leukocytosis      Hospital Course:  78 y/o with a h/o COPD with prior hospitalizations for COPD exacerbation. That presented to the hospital with c/o increase in cough, sputum production, and dyspnea. Pt was treated for COPD exacerbation. Initial chest x ray showed left lung pneumonia. Pt improved on IV steroids, antibiotics, and duonebs.   01/21/2011 f/u ov/Katie Bryant cc  baseline doe Katie Bryant off 02 and on 02 can do the whole store s stopping at 3lpm but discharged on 4lpm does not wear it 24 hours 25% of the time. Uses multiple forms of albuterol. Cough is better, sputum mucoid.  >no changes, med rec next step   02/03/11 Follow up and med review Pt returns for follow up and med review.  We reviewed all her meds and organized them into a med calendar with pt education .  She appears to be taking her meds correctly.  She has returned back to her baseline from recent PNA episode last month. CXR c/w complete clearance   rec follow med calendar   03/11/2011 f/u ov/Katie Bryant cc ok x nose bleeds ? On 02 has calendar but not using it, ad libbing on 02 but overall feels making slow progress with return to nl activity tol and really no limiting sob or cough or need for daytime 02 rec Ok to leave 02 off at rest    06/20/2011 f/u ov/Katie Bryant cc 2-3 weeks  increased cough, and sneezing > mucus turned thick yellow rx abx and steroids x 2 some better but very confused with meds/ names/ not following action plans at bottom of calendar.  Sleeping poorly with nocturnal  And  early am exacerbation  of respiratory  c/o's and  need for noct saba. rec Prednisone 10 mg take  4 each am x 2  days,   2 each am x 2 days,  1 each am x2days and stop  Add pepcid 20 mg one at bedtime (Zantac/ranitidine) 150 mg) one at bedtime GERD diet reviewed Flutter valve should help bring up mucus - use it as much as your want Keep Follow up [facility] office Tuesday June 18  Late add: Clarify 02 use for problem list at next ov    06/24/2011 f/u ov/Katie Bryant cc breathing some better but thoroughly confused with instructions/ names of meds/ lost calendar we reviwed just 4 days  prior to OV  - husband came with her today also confused with details of care.  Cough much looser with flutter, producing less thick white mucus only,  not sure which mucinex product she's using, hfa technique quite poor. Did not follow instructions on adding H2  at bedtime "using an acid reducer as needed"  >>budesonide Twice daily  And taper steroids to off   07/09/2011 Follow up and Med review  Returns after recent COPD flare -now resolved and back to her baseline  We reviewed all her meds and organized them into a med calendar w/ pt edcuation  Recent changes were reviewed.  rec Follow med calendar closely and bring to each visit   08/12/2011 f/u ov/Katie Bryant cc  breathing about the same, minimal cough, thoroughly confused again with instructions and meds, recognizes the calendar we gave her and remembers receiving it but didn't bring it and not using it.   No unusual cough, purulent sputum or HB symptoms on present rx. Does have chronic nasal obst symptoms esp at hs takes otc doesn't know name.  >>no changes   08/27/2011    Follow up and med calendar  We reviewed all her meds and organized them into a med calendar w/ pt edcuation  She says she got her old med calendar at last ov b/c she was in a hurry Have suggested she throw away old forms if not needed.  She appears to be taking meds correctly Says she is doing well. No flare in dyspnea or cough No fever or chest pain rec No change in rx > follow calendar  09/29/2011 f/u ov/Katie Bryant  has med calendar and doing well until 2 weeks prior to OV  with worse nasal congestion and green mucus production. Had been using allegra daily x years and still having lots of watery rhinitis "all year long" refractory to flonase.   Still no increase need for rescue saba daytime  Sleeping ok without nocturnal  or early am exacerbation  of respiratory  c/o's or need for noct saba. Also denies any obvious fluctuation of symptoms with weather or environmental changes or other aggravating or alleviating factors except as outlined above   ROS  The following are not active complaints unless bolded sore throat, dysphagia, dental problems, itching, sneezing,  nasal congestion or excess/ purulent secretions, ear ache,   fever, chills, sweats, unintended wt loss, pleuritic or exertional cp, hemoptysis,  orthopnea pnd or leg swelling, presyncope, palpitations, heartburn, abdominal pain, anorexia, nausea, vomiting, diarrhea  or change in bowel or urinary habits, change in stools or urine, dysuria,hematuria,  rash, arthralgias, visual complaints, headache, numbness weakness or ataxia or problems with walking or coordination,  change in mood/affect or memory.                           Objective:   Physical Exam Pleasant amb wf nad Wt 184 01/21/2011  > 184 06/20/2011 > 08/12/2011   186> 08/27/2011 186 > 180 09/29/2011  HEENT mild turbinate edema.  Oropharynx no thrush or excess pnd or cobblestoning.  No JVD or cervical adenopathy. Mild accessory muscle hypertrophy. Trachea midline, nl thryroid. Chest was hyperinflated by percussion with diminished breath sounds Regular rate and rhythm without murmur gallop or rub or increase P2 or edema.  Abd: no hsm, nl excursion. Ext warm without cyanosis or clubbing.       CXR 02/03/11  Interval resolution of the previously seen infiltrates/edema  with stable chronic changes.    Assessment:         Plan:

## 2011-10-07 ENCOUNTER — Ambulatory Visit (INDEPENDENT_AMBULATORY_CARE_PROVIDER_SITE_OTHER): Payer: Medicare Other | Admitting: Internal Medicine

## 2011-10-07 ENCOUNTER — Encounter: Payer: Self-pay | Admitting: Internal Medicine

## 2011-10-07 VITALS — BP 122/64 | HR 73 | Ht 66.0 in | Wt 183.0 lb

## 2011-10-07 DIAGNOSIS — J9610 Chronic respiratory failure, unspecified whether with hypoxia or hypercapnia: Secondary | ICD-10-CM

## 2011-10-07 DIAGNOSIS — J310 Chronic rhinitis: Secondary | ICD-10-CM

## 2011-10-07 DIAGNOSIS — J449 Chronic obstructive pulmonary disease, unspecified: Secondary | ICD-10-CM

## 2011-10-07 NOTE — Assessment & Plan Note (Signed)
-   sats 90% RA 03/11/2011     - Dr Maple Hudson started 02 around 2010    - 3 lpm at hs, 3lpm with activity, as needed at rest  Adequate control on present rx, reviewed

## 2011-10-07 NOTE — Assessment & Plan Note (Signed)
-   Much improved on dymista 10/07/2011 > ok to use it prn watery rhinitis

## 2011-10-07 NOTE — Patient Instructions (Addendum)
No change in medications  See calendar dated 09/29/11 for specific medication instructions and bring it back for each and every office visit with all your medications especially your nebulizer solution for every healthcare provider you see.  Without it,  you may not receive the best quality medical care that we feel you deserve.  You will note that the calendar groups together  your maintenance  medications that are timed at particular times of the day.  Think of this as your checklist for what your doctor has instructed you to do until your next evaluation to see what benefit  there is  to staying on a consistent group of medications intended to keep you well.  The other group at the bottom is entirely up to you to use as you see fit  for specific symptoms that may arise between visits that require you to treat them on an as needed basis.  Think of this as your action plan or "what if" list.   Separating the top medications from the bottom group is fundamental to providing you adequate care going forward.    Please schedule a follow up office visit in 4 weeks to the [facility]- if condition worsens see Tammy NP in meantime with all medications in hand so she can verify your meds match up to the calendar

## 2011-10-07 NOTE — Assessment & Plan Note (Signed)
-   PFT's 06/11/2007  FEV1   0.70 (34%) ratio 32 with 49% improvement p B2    - HFA 75% effective p coaching 06/20/2011 > 50% 06/24/2011  -med calendar 02/03/11  > not using correctly 06/20/11 > not using at all 06/24/2011 , 07/08/2011, 08/12/2011 , 08/27/2011  > using it 09/29/2011   Adequate control on present rx, reviewed need to keep up with med calendar, names of meds and when they need to be refilled so she doesn't run out.

## 2011-10-07 NOTE — Progress Notes (Signed)
Subjective:     Patient ID: Katie Bryant, female   DOB: 06/23/1928    MRN: [id number]  HPI 65 yowf quit smoking 1992 with GOLD III copd dx 2009  followed in [facility] p hospitalized at [facility]   Admit date: 12/21/2010  Discharge date: 12/24/2010  C O P D  ALLERGIC RHINITIS  DYSPNEA  G E R D  Aortic stenosis  Insomnia  Leukocytosis      Hospital Course:  78 y/o with a h/o COPD with prior hospitalizations for COPD exacerbation. That presented to the hospital with c/o increase in cough, sputum production, and dyspnea. Pt was treated for COPD exacerbation. Initial chest x ray showed left lung pneumonia. Pt improved on IV steroids, antibiotics, and duonebs.   01/21/2011 f/u ov/Katie Bryant cc  baseline doe Katie Bryant off 02 and on 02 can do the whole store s stopping at 3lpm but discharged on 4lpm does not wear it 24 hours 25% of the time. Uses multiple forms of albuterol. Cough is better, sputum mucoid.  >no changes, med rec next step   02/03/11 Follow up and med review Pt returns for follow up and med review.  We reviewed all her meds and organized them into a med calendar with pt education .  She appears to be taking her meds correctly.  She has returned back to her baseline from recent PNA episode last month. CXR c/w complete clearance   rec follow med calendar   03/11/2011 f/u ov/Katie Bryant cc ok x nose bleeds ? On 02 has calendar but not using it, ad libbing on 02 but overall feels making slow progress with return to nl activity tol and really no limiting sob or cough or need for daytime 02 rec Ok to leave 02 off at rest    06/20/2011 f/u ov/Katie Bryant cc 2-3 weeks  increased cough, and sneezing > mucus turned thick yellow rx abx and steroids x 2 some better but very confused with meds/ names/ not following action plans at bottom of calendar.  Sleeping poorly with nocturnal  And  early am exacerbation  of respiratory  c/o's and  need for noct saba. rec Prednisone 10 mg take  4 each am x 2  days,   2 each am x 2 days,  1 each am x2days and stop  Add pepcid 20 mg one at bedtime (Zantac/ranitidine) 150 mg) one at bedtime GERD diet reviewed Flutter valve should help bring up mucus - use it as much as your want Keep Follow up [facility] office Tuesday June 18  Late add: Clarify 02 use for problem list at next ov    06/24/2011 f/u ov/Katie Bryant cc breathing some better but thoroughly confused with instructions/ names of meds/ lost calendar we reviwed just 4 days  prior to OV  - husband came with her today also confused with details of care.  Cough much looser with flutter, producing less thick white mucus only,  not sure which mucinex product she's using, hfa technique quite poor. Did not follow instructions on adding H2  at bedtime "using an acid reducer as needed"  >>budesonide Twice daily  And taper steroids to off   07/09/2011 Follow up and Med review  Returns after recent COPD flare -now resolved and back to her baseline  We reviewed all her meds and organized them into a med calendar w/ pt edcuation  Recent changes were reviewed.  rec Follow med calendar closely and bring to each visit   08/12/2011 f/u ov/Katie Bryant cc  breathing about the same, minimal cough, thoroughly confused again with instructions and meds, recognizes the calendar we gave her and remembers receiving it but didn't bring it and not using it.   No unusual cough, purulent sputum or HB symptoms on present rx. Does have chronic nasal obst symptoms esp at hs takes otc doesn't know name.  >>no changes   08/27/2011    Follow up and med calendar  We reviewed all her meds and organized them into a med calendar w/ pt edcuation  She says she got her old med calendar at last ov b/c she was in a hurry Have suggested she throw away old forms if not needed.  She appears to be taking meds correctly Says she is doing well. No flare in dyspnea or cough No fever or chest pain rec No change in rx > follow calendar  09/29/2011 f/u ov/Katie Bryant  has med calendar and doing well until 2 weeks prior to OV  with worse nasal congestion and green mucus production. Had been using allegra daily x years and still having lots of watery rhinitis "all year long" refractory to flonase.   Still no increase need for rescue saba daytime rec Augmentin 875 twice daily x 10 days  Prednisone 10 mg take  4 each am x 2 days,   2 each am x 2 days,  1 each am x2days and stop  Start Dymista one twice daily  Please see patient coordinator before you leave today  to schedule nebs refills through [facility] See calendar for specific medication instructions   10/07/2011 f/u ov/Katie Bryant did not bring calendar given just 10 days prior to OV  But says she's following it and breathing and all resp cc's quite a bit better including her watery rhinitis.  Just completing abx, not sure she ever got her prednsone that was called in on previous ov.  No obvious daytime variabilty or assoc chronic cough or cp or chest tightness, subjective wheeze overt sinus or hb symptoms. No unusual exp hx - still very confused with names of meds, esp inhalers and nebs.   Sleeping ok without nocturnal  or early am exacerbation  of respiratory  c/o's or need for noct saba. Also denies any obvious fluctuation of symptoms with weather or environmental changes or other aggravating or alleviating factors except as outlined above   ROS  The following are not active complaints unless bolded sore throat, dysphagia, dental problems, itching, sneezing,  nasal congestion or excess/ purulent secretions, ear ache,   fever, chills, sweats, unintended wt loss, pleuritic or exertional cp, hemoptysis,  orthopnea pnd or leg swelling, presyncope, palpitations, heartburn, abdominal pain, anorexia, nausea, vomiting, diarrhea  or change in bowel or urinary habits, change in stools or urine, dysuria,hematuria,  rash, arthralgias, visual complaints, headache, numbness weakness or ataxia or problems with walking or coordination,   change in mood/affect or memory.                           Objective:   Physical Exam Pleasant amb wf nad Wt 184 01/21/2011  > 184 06/20/2011 > 08/12/2011   186> 08/27/2011 186 > 180 09/29/2011 > 10/07/2011  183 HEENT mild turbinate edema.  Oropharynx no thrush or excess pnd or cobblestoning.  No JVD or cervical adenopathy. Mild accessory muscle hypertrophy. Trachea midline, nl thryroid. Chest was hyperinflated by percussion with diminished breath sounds Regular rate and rhythm without murmur gallop or rub or increase P2 or edema.  Abd: no hsm, nl excursion. Ext warm without cyanosis or clubbing.       CXR 02/03/11  Interval resolution of the previously seen infiltrates/edema  with stable chronic changes.    Assessment:         Plan:

## 2011-10-15 ENCOUNTER — Telehealth: Payer: Self-pay | Admitting: Internal Medicine

## 2011-10-15 MED ORDER — IPRATROPIUM-ALBUTEROL 0.5-2.5 (3) MG/3ML IN SOLN: 3.0000 mL | Freq: Four times a day (QID) | RESPIRATORY_TRACT | Status: DC

## 2011-10-15 MED ORDER — BUDESONIDE 0.5 MG/2ML IN SUSP: 0.5000 mg | Freq: Two times a day (BID) | RESPIRATORY_TRACT | Status: DC

## 2011-10-15 NOTE — Telephone Encounter (Signed)
Pt called back to add a fax# for [facility] "to move this along quickly: [phone]. Hazel Sams

## 2011-10-15 NOTE — Telephone Encounter (Signed)
Pt states that she needs this filled w/ [facility] because the cost will be lower if she gets this filled w/ [facility].  Pt states that she needs this taken care of ASAP so she does not have to go w/out the meds.  Thanks!  Antionette Fairy

## 2011-10-15 NOTE — Telephone Encounter (Signed)
Rxs were printed and faxed to the number Okey Regal gave. Spoke with her and made aware.  She states nothing further needed.

## 2011-10-15 NOTE — Telephone Encounter (Signed)
Called [facility] and spoke with Okey Regal. She states that she will call and discuss this with the pt. She states that it may be that she had already had meds filled at [facility] before, and therefore [facility] will not be able to bill her insurance depending on how long it has been. Okey Regal to call the pt and call us back if needed.

## 2011-10-23 ENCOUNTER — Other Ambulatory Visit: Payer: Self-pay | Admitting: Internal Medicine

## 2011-10-23 ENCOUNTER — Telehealth: Payer: Self-pay | Admitting: Internal Medicine

## 2011-10-23 MED ORDER — AZELASTINE-FLUTICASONE 137-50 MCG/ACT NA SUSP: 1.0000 | Freq: Two times a day (BID) | NASAL | Status: DC

## 2011-10-23 NOTE — Telephone Encounter (Signed)
Please advise if okay to refill- last filled on 09/23/11 #60 tablets no refills

## 2011-10-23 NOTE — Telephone Encounter (Signed)
Pt aware rx has been sent. Nothing further was needed 

## 2011-10-26 ENCOUNTER — Other Ambulatory Visit: Payer: Self-pay | Admitting: Internal Medicine

## 2011-10-27 NOTE — Telephone Encounter (Signed)
rx was signed by MW and faxed to pharmacy

## 2011-11-11 ENCOUNTER — Encounter: Payer: Self-pay | Admitting: Internal Medicine

## 2011-11-11 ENCOUNTER — Ambulatory Visit (INDEPENDENT_AMBULATORY_CARE_PROVIDER_SITE_OTHER): Payer: Medicare Other | Admitting: Internal Medicine

## 2011-11-11 VITALS — BP 130/70 | HR 78 | Temp 97.7°F | Ht 66.0 in | Wt 181.0 lb

## 2011-11-11 DIAGNOSIS — J449 Chronic obstructive pulmonary disease, unspecified: Secondary | ICD-10-CM

## 2011-11-11 DIAGNOSIS — J310 Chronic rhinitis: Secondary | ICD-10-CM

## 2011-11-11 DIAGNOSIS — J9610 Chronic respiratory failure, unspecified whether with hypoxia or hypercapnia: Secondary | ICD-10-CM

## 2011-11-11 NOTE — Patient Instructions (Addendum)
Chlortrimeton 4 mg at bedtime automatically and again  every 6 hours as needed for stuffy nose, runny nose (instead of allegra)   Take the budesonide along with your morning treatments  See calendar for specific medication instructions and bring it back for each and every office visit for every healthcare provider you see.  Without it,  you may not receive the best quality medical care that we feel you deserve.  You will note that the calendar groups together  your maintenance  medications that are timed at particular times of the day.  Think of this as your checklist for what your doctor has instructed you to do until your next evaluation to see what benefit  there is  to staying on a consistent group of medications intended to keep you well.  The other group at the bottom is entirely up to you to use as you see fit  for specific symptoms that may arise between visits that require you to treat them on an as needed basis.  Think of this as your action plan or "what if" list.   Separating the top medications from the bottom group is fundamental to providing you adequate care going forward.    Please schedule a follow up visit in 2 months but call sooner if needed   Late add: consider performist/bud and change to spiriva and prn saba but she gets so confused reluctant to do this unless comes in for new calendar too

## 2011-11-11 NOTE — Assessment & Plan Note (Signed)
-   sats 90% RA 03/11/2011     - Dr Maple Hudson started 02 around 2010    - 3 lpm at hs, 3lpm with activity, as needed at rest  Adequate control on present rx, reviewed rx with her - instead of using 02 with activity she is restricting acitvities or using too much saba with activity.

## 2011-11-11 NOTE — Progress Notes (Signed)
Subjective:     Patient ID: Katie Bryant, female   DOB: April 03, 1928    MRN: [id number]  HPI 59 yowf quit smoking 1992 with GOLD III copd dx 2009  followed in [facility] p hospitalized at [facility]   Admit date: 12/21/2010  Discharge date: 12/24/2010  C O P D  ALLERGIC RHINITIS  DYSPNEA  G E R D  Aortic stenosis  Insomnia  Leukocytosis        01/21/2011 f/u ov/Tyvion Edmondson cc  baseline doe [name] off 02 and on 02 can do the whole store s stopping at 3lpm but discharged on 4lpm does not wear it 24 hours 25% of the time. Uses multiple forms of albuterol. Cough is better, sputum mucoid.  >no changes, med rec next step   02/03/11 Follow up and med review Pt returns for follow up and med review.  We reviewed all her meds and organized them into a med calendar with pt education .  She appears to be taking her meds correctly.  She has returned back to her baseline from recent PNA episode last month. CXR c/w complete clearance   rec follow med calendar   03/11/2011 f/u ov/Teoman Giraud cc ok x nose bleeds ? On 02 has calendar but not using it, ad libbing on 02 but overall feels making slow progress with return to nl activity tol and really no limiting sob or cough or need for daytime 02 rec Ok to leave 02 off at rest    06/20/2011 f/u ov/Vernice Bowker cc 2-3 weeks  increased cough, and sneezing > mucus turned thick yellow rx abx and steroids x 2 some better but very confused with meds/ names/ not following action plans at bottom of calendar.  Sleeping poorly with nocturnal  And  early am exacerbation  of respiratory  c/o's and  need for noct saba. rec Prednisone 10 mg take  4 each am x 2 days,   2 each am x 2 days,  1 each am x2days and stop  Add pepcid 20 mg one at bedtime (Zantac/ranitidine) 150 mg) one at bedtime GERD diet reviewed Flutter valve should help bring up mucus - use it as much as your want Keep Follow up [facility] office Tuesday June 18  Late add: Clarify 02 use for problem list at next ov     06/24/2011 f/u ov/Dolores Ewing cc breathing some better but thoroughly confused with instructions/ names of meds/ lost calendar we reviwed just 4 days  prior to OV  - husband came with her today also confused with details of care.  Cough much looser with flutter, producing less thick white mucus only,  not sure which mucinex product she's using, hfa technique quite poor. Did not follow instructions on adding H2  at bedtime "using an acid reducer as needed"  >>budesonide Twice daily  And taper steroids to off   07/09/2011 Follow up and Med review  Returns after recent COPD flare -now resolved and back to her baseline  We reviewed all her meds and organized them into a med calendar w/ pt edcuation  Recent changes were reviewed.  rec Follow med calendar closely and bring to each visit   08/12/2011 f/u ov/Wilburt Messina cc breathing about the same, minimal cough, thoroughly confused again with instructions and meds, recognizes the calendar we gave her and remembers receiving it but didn't bring it and not using it.   No unusual cough, purulent sputum or HB symptoms on present rx. Does have chronic nasal obst symptoms esp  at hs takes otc doesn't know name.  >>no changes   08/27/2011    Follow up and med calendar  We reviewed all her meds and organized them into a med calendar w/ pt edcuation  She says she got her old med calendar at last ov b/c she was in a hurry Have suggested she throw away old forms if not needed.  She appears to be taking meds correctly Says she is doing well. No flare in dyspnea or cough No fever or chest pain rec No change in rx > follow calendar  09/29/2011 f/u ov/Elysse Polidore has med calendar and doing well until 2 weeks prior to OV  with worse nasal congestion and green mucus production. Had been using allegra daily x years and still having lots of watery rhinitis "all year long" refractory to flonase.   Still no increase need for rescue saba daytime rec Augmentin 875 twice daily x 10 days    Prednisone 10 mg take  4 each am x 2 days,   2 each am x 2 days,  1 each am x2days and stop  Start Dymista one twice daily  Please see patient coordinator before you leave today  to schedule nebs refills through [facility] See calendar for specific medication instructions   10/07/2011 f/u ov/Yarissa Reining did not bring calendar given just 10 days prior to OV  But says she's following it and breathing and all resp cc's quite a bit better including her watery rhinitis.  Just completing abx, not sure she ever got her prednsone that was called in on previous ov. x - still very confused with names of meds, esp inhalers and nebs. rec Use med calendar No change rx  11/11/2011 f/u ov/Jerzee Jerome / [name] with med calendar in hand cc nasal congestion and "nose runs like a faucet" watery esp in ams despite prn allegra.  Still struggling with concept of medication reconciliation but doing some better and using xopenex about twice  Daily, her avg.  No obvious daytime variabilty or assoc chronic cough or cp or chest tightness, subjective wheeze overt sinus or hb symptoms. No unusual exp hx    Sleeping ok without nocturnal  or early am exacerbation  of respiratory  c/o's or need for noct saba. Also denies any obvious fluctuation of symptoms with weather or environmental changes or other aggravating or alleviating factors except as outlined above   ROS  The following are not active complaints unless bolded sore throat, dysphagia, dental problems, itching, sneezing,  nasal congestion or excess/ purulent secretions, ear ache,   fever, chills, sweats, unintended wt loss, pleuritic or exertional cp, hemoptysis,  orthopnea pnd or leg swelling, presyncope, palpitations, heartburn, abdominal pain, anorexia, nausea, vomiting, diarrhea  or change in bowel or urinary habits, change in stools or urine, dysuria,hematuria,  rash, arthralgias, visual complaints, headache, numbness weakness or ataxia or problems with walking or coordination,  change  in mood/affect or memory.               Objective:   Physical Exam Pleasant amb wf nad Wt 184 01/21/2011  >08/27/2011 186 > 180 09/29/2011 > 10/07/2011  183 > 11/11/2011  181 HEENT mod non-specific turbinate edema.  Oropharynx no thrush or excess pnd or cobblestoning.  No JVD or cervical adenopathy. Mild accessory muscle hypertrophy. Trachea midline, nl thryroid. Chest was hyperinflated by percussion with diminished breath sounds Regular rate and rhythm without murmur gallop or rub or increase P2 or edema.  Abd: no hsm, nl excursion. Ext warm  without cyanosis or clubbing.       CXR 02/03/11  Interval resolution of the previously seen infiltrates/edema  with stable chronic changes.    Assessment:         Plan:

## 2011-11-11 NOTE — Assessment & Plan Note (Signed)
-   PFT's 06/11/2007  FEV1   0.70 (34%) ratio 32 with 49% improvement p B2    - HFA 75% effective p coaching 06/20/2011 > 50% 06/24/2011  -med calendar 02/03/11  > not using correctly 06/20/11 > not using at all 06/24/2011 , 07/08/2011, 08/12/2011 , 08/27/2011  > using it 09/29/2011   GOLD IV with adequate control of symptoms on present rx    Each maintenance medication was reviewed in detail including most importantly the difference between maintenance and as needed and under what circumstances the prns are to be used. This was done in the context of a medication calendar review which provided the patient with a user-friendly unambiguous mechanism for medication administration and reconciliation and provides an action plan for all active problems. It is critical that this be shown to every doctor  for modification during the office visit if necessary so the patient can use it as a working document.

## 2011-11-11 NOTE — Assessment & Plan Note (Signed)
-   Much improved on dymista 10/07/2011  But still symptomatic esp in am > try using the dymista bid and 1st gen qhs plus extra daytime doses if can tolerate the drowsiness having failed to improve on allegra

## 2011-11-12 ENCOUNTER — Telehealth: Payer: Self-pay | Admitting: *Deleted

## 2011-11-19 ENCOUNTER — Other Ambulatory Visit: Payer: Self-pay

## 2011-11-19 MED ORDER — FENOFIBRATE MICRONIZED 134 MG PO CAPS: 134.0000 mg | ORAL_CAPSULE | Freq: Every day | ORAL | Status: DC

## 2011-11-19 NOTE — Telephone Encounter (Signed)
..   Requested Prescriptions   Signed Prescriptions Disp Refills  . fenofibrate micronized (LOFIBRA) 134 MG capsule 30 capsule 6    Sig: Take 1 capsule (134 mg total) by mouth daily before breakfast.    Authorizing Provider: Rollene Rotunda    Ordering User: Christella Hartigan, Valentine Barney Judie Petit

## 2011-11-24 ENCOUNTER — Telehealth: Payer: Self-pay | Admitting: Internal Medicine

## 2011-11-24 NOTE — Telephone Encounter (Signed)
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 She currently has alprazolam 0.5 BID. Pt is requesting to be able to go up to TID. She stated she is just so exhausted from having to take care of her husband. Pt aware MW is out of the office until 11/26/11. Please advise Dr. Sherene Sires thanks

## 2011-11-24 NOTE — Telephone Encounter (Signed)
Ok to increase to tid prn for this refill only  #90 - needs ov with me in [place] or here before refill runs out

## 2011-12-03 ENCOUNTER — Other Ambulatory Visit: Payer: Self-pay | Admitting: Internal Medicine

## 2012-01-13 ENCOUNTER — Ambulatory Visit: Payer: Medicare Other | Admitting: Internal Medicine

## 2012-01-13 ENCOUNTER — Ambulatory Visit (INDEPENDENT_AMBULATORY_CARE_PROVIDER_SITE_OTHER): Payer: Medicare Other | Admitting: Internal Medicine

## 2012-01-13 ENCOUNTER — Encounter: Payer: Self-pay | Admitting: Internal Medicine

## 2012-01-13 VITALS — BP 130/70 | HR 81 | Temp 98.6°F | Ht 66.0 in | Wt 183.0 lb

## 2012-01-13 DIAGNOSIS — J449 Chronic obstructive pulmonary disease, unspecified: Secondary | ICD-10-CM

## 2012-01-13 DIAGNOSIS — J9610 Chronic respiratory failure, unspecified whether with hypoxia or hypercapnia: Secondary | ICD-10-CM

## 2012-01-13 MED ORDER — PREDNISONE (PAK) 10 MG PO TABS: ORAL_TABLET | ORAL | Status: DC

## 2012-01-13 NOTE — Patient Instructions (Addendum)
Chlortrimeton 4 mg at bedtime automatically and again  every 6 hours as needed for stuffy nose, runny nose (instead of allegra)   Prednisone 10 mg take  4 each am x 2 days,   2 each am x 2 days,  1 each am x2days and stop  For cough or congestion may use either robitussin dm or mucinex or mucinex  For stuffy nose ok to use afrin / oxymetazolone up to 5 days in a row only, and prefer you use it just on one side at a time to prevent "addiction"  See calendar for specific medication instructions and bring it back for each and every office visit for every healthcare provider you see.  Without it,  you may not receive the best quality medical care that we feel you deserve.  You will note that the calendar groups together  your maintenance  medications that are timed at particular times of the day.  Think of this as your checklist for what your doctor has instructed you to do until your next evaluation to see what benefit  there is  to staying on a consistent group of medications intended to keep you well.  The other group at the bottom is entirely up to you to use as you see fit  for specific symptoms that may arise between visits that require you to treat them on an as needed basis.  Think of this as your action plan or "what if" list.   Separating the top medications from the bottom group is fundamental to providing you adequate care going forward.    Please schedule a follow up office visit in 4 weeks, sooner if needed to see Tammy with all medications

## 2012-01-13 NOTE — Progress Notes (Signed)
Subjective:     Patient ID: Katie Bryant, female   DOB: Sep 04, 1928    MRN: [id number]  HPI 76 yowf quit smoking 1992 with GOLD III copd dx 2009  [facility] p hospitalized at [facility]   Admit date: 12/21/2010  Discharge date: 12/24/2010  C O P D  ALLERGIC RHINITIS  DYSPNEA  G E R D  Aortic stenosis  Insomnia  Leukocytosis        01/21/2011 f/u ov/Katie Bryant cc  baseline doe Katie Bryant off 02 and on 02 can do the whole store s stopping at 3lpm but discharged on 4lpm does not wear it 24 hours 25% of the time. Uses multiple forms of albuterol. Cough is better, sputum mucoid.  >no changes, med rec next step   02/03/11 Follow up and med review Pt returns for follow up and med review.  We reviewed all her meds and organized them into a med calendar with pt education .  She appears to be taking her meds correctly.  She has returned back to her baseline from recent PNA episode last month. CXR c/w complete clearance   rec follow med calendar   03/11/2011 f/u ov/Katie Bryant cc ok x nose bleeds ? On 02 has calendar but not using it, ad libbing on 02 but overall feels making slow progress with return to nl activity tol and really no limiting sob or cough or need for daytime 02 rec Ok to leave 02 off at rest    06/20/2011 f/u ov/Katie Bryant cc 2-3 weeks  increased cough, and sneezing > mucus turned thick yellow rx abx and steroids x 2 some better but very confused with meds/ names/ not following action plans at bottom of calendar.  Sleeping poorly with nocturnal  And  early am exacerbation  of respiratory  c/o's and  need for noct saba. rec Prednisone 10 mg take  4 each am x 2 days,   2 each am x 2 days,  1 each am x2days and stop  Add pepcid 20 mg one at bedtime (Zantac/ranitidine) 150 mg) one at bedtime GERD diet reviewed Flutter valve should help bring up mucus - use it as much as your want Keep Follow up [facility] office Tuesday June 18  Late add: Clarify 02 use for problem list at next ov     06/24/2011 f/u ov/Katie Bryant cc breathing some better but thoroughly confused with instructions/ names of meds/ lost calendar we reviwed just 4 days  prior to OV  - husband came with her today also confused with details of care.  Cough much looser with flutter, producing less thick white mucus only,  not sure which mucinex product she's using, hfa technique quite poor. Did not follow instructions on adding H2  at bedtime "using an acid reducer as needed"  >>budesonide Twice daily  And taper steroids to off   07/09/2011 Follow up and Med review  Returns after recent COPD flare -now resolved and back to her baseline  We reviewed all her meds and organized them into a med calendar w/ pt edcuation  Recent changes were reviewed.  rec Follow med calendar closely and bring to each visit   08/12/2011 f/u ov/Katie Bryant cc breathing about the same, minimal cough, thoroughly confused again with instructions and meds, recognizes the calendar we gave her and remembers receiving it but didn't bring it and not using it.   No unusual cough, purulent sputum or HB symptoms on present rx. Does have chronic nasal obst symptoms esp  at hs takes otc doesn't know name.  >>no changes   08/27/2011    Follow up and med calendar  We reviewed all her meds and organized them into a med calendar w/ pt edcuation  She says she got her old med calendar at last ov b/c she was in a hurry Have suggested she throw away old forms if not needed.  She appears to be taking meds correctly Says she is doing well. No flare in dyspnea or cough No fever or chest pain rec No change in rx > follow calendar  09/29/2011 f/u ov/Katie Bryant has med calendar and doing well until 2 weeks prior to OV  with worse nasal congestion and green mucus production. Had been using allegra daily x years and still having lots of watery rhinitis "all year long" refractory to flonase.   Still no increase need for rescue saba daytime rec Augmentin 875 twice daily x 10 days    Prednisone 10 mg take  4 each am x 2 days,   2 each am x 2 days,  1 each am x2days and stop  Start Dymista one twice daily  Please see patient coordinator before you leave today  to schedule nebs refills through [facility] See calendar for specific medication instructions   10/07/2011 f/u ov/Katie Bryant did not bring calendar given just 10 days prior to OV  But says she's following it and breathing and all resp cc's quite a bit better including her watery rhinitis.  Just completing abx, not sure she ever got her prednsone that was called in on previous ov. x - still very confused with names of meds, esp inhalers and nebs. rec Use med calendar No change rx  11/11/2011 f/u ov/Katie Bryant / Katie Bryant with med calendar in hand cc nasal congestion and "nose runs like a faucet" watery esp in ams despite prn allegra.  Still struggling with concept of medication reconciliation but doing some better and using xopenex about twice  Daily, her avg.  rec Chlortrimeton 4 mg at bedtime automatically and again  every 6 hours as needed for stuffy nose, runny nose (instead of allegra)  Take the budesonide along with your morning treatments No obvious daytime variabilty or assoc chronic cough or cp or chest tightness, subjective wheeze overt sinus or hb symptoms. No unusual exp hx   01/13/2012 f/u ov/Katie Bryant cc still confused with meds, not following prev instructions re 1st gen H1 re nasal symptoms has calendar but not using it. Variably sob room to room.  No purulent sputum overt sinus or hb symptoms. Some better with saba.     Sleeping ok without nocturnal  or early am exacerbation  of respiratory  c/o's or need for noct saba. Also denies any obvious fluctuation of symptoms with weather or environmental changes or other aggravating or alleviating factors except as outlined above   ROS  The following are not active complaints unless bolded sore throat, dysphagia, dental problems, itching, sneezing,  nasal congestion or excess/ purulent  secretions, ear ache,   fever, chills, sweats, unintended wt loss, pleuritic or exertional cp, hemoptysis,  orthopnea pnd or leg swelling, presyncope, palpitations, heartburn, abdominal pain, anorexia, nausea, vomiting, diarrhea  or change in bowel or urinary habits, change in stools or urine, dysuria,hematuria,  rash, arthralgias, visual complaints, headache, numbness weakness or ataxia or problems with walking or coordination,  change in mood/affect or memory.               Objective:   Physical Exam Pleasant amb  wf nad Wt 184 01/21/2011  >08/27/2011 186 > 180 09/29/2011 > 10/07/2011  183 > 11/11/2011  181 > 01/13/2012  183 HEENT mod non-specific turbinate edema.  Oropharynx no thrush or excess pnd or cobblestoning.  No JVD or cervical adenopathy. Mild accessory muscle hypertrophy. Trachea midline, nl thryroid. Chest was hyperinflated by percussion with diminished breath sounds Regular rate and rhythm without murmur gallop or rub or increase P2 or edema.  Abd: no hsm, nl excursion. Ext warm without cyanosis or clubbing.       CXR 02/03/11  Interval resolution of the previously seen infiltrates/edema  with stable chronic changes.    Assessment:         Plan:

## 2012-01-14 ENCOUNTER — Telehealth: Payer: Self-pay | Admitting: Internal Medicine

## 2012-01-14 NOTE — Telephone Encounter (Signed)
Spoke to the pt and she states that she can't find the AVS I gave her when she left. She thinks someone at home is messing with her and try to prove her not competent. She is asking if I will place an AVS to her in the mail. I advised her that I would get that done for her today. She verbalized understanding.

## 2012-01-18 NOTE — Assessment & Plan Note (Signed)
-   sats 90% RA 03/11/2011     - Dr Maple Hudson started 02 around 2010    - 3 lpm at hs, 3lpm with activity, as needed at rest

## 2012-01-18 NOTE — Assessment & Plan Note (Signed)
-   PFT's 06/11/2007  FEV1   0.70 (34%) ratio 32 with 49% improvement p B2    - HFA 75% effective p coaching 06/20/2011 > 50% 06/24/2011  -med calendar 02/03/11  > not using correctly 06/20/11 > not using at all 06/24/2011 , 07/08/2011, 08/12/2011 , 08/27/2011  > using it 09/29/2011 > not using correctly 01/13/12  She is variably better, variably worse, not really serious about following instructions, either with maint or prns  I had an extended discussion with the patient today lasting 15 to 20 minutes of a 25 minute visit on the following issues:     Each maintenance medication was reviewed in detail including most importantly the difference between maintenance and as needed and under what circumstances the prns are to be used. This was done in the context of a medication calendar review which provided the patient with a user-friendly unambiguous mechanism for medication administration and reconciliation and provides an action plan for all active problems. It is critical that this be shown to every doctor  for modification during the office visit if necessary so the patient can use it as a working document.

## 2012-01-19 ENCOUNTER — Ambulatory Visit: Payer: Medicare Other | Admitting: Internal Medicine

## 2012-01-27 ENCOUNTER — Ambulatory Visit: Payer: Medicare Other | Admitting: Cardiology

## 2012-01-28 ENCOUNTER — Encounter: Payer: Self-pay | Admitting: Cardiology

## 2012-01-28 ENCOUNTER — Ambulatory Visit (INDEPENDENT_AMBULATORY_CARE_PROVIDER_SITE_OTHER): Payer: Medicare Other | Admitting: Cardiology

## 2012-01-28 VITALS — BP 135/80 | HR 74 | Ht 66.0 in | Wt 180.0 lb

## 2012-01-28 DIAGNOSIS — I359 Nonrheumatic aortic valve disorder, unspecified: Secondary | ICD-10-CM

## 2012-01-28 DIAGNOSIS — I350 Nonrheumatic aortic (valve) stenosis: Secondary | ICD-10-CM

## 2012-01-28 DIAGNOSIS — I10 Essential (primary) hypertension: Secondary | ICD-10-CM

## 2012-01-28 NOTE — Patient Instructions (Addendum)
The current medical regimen is effective;  continue present plan and medications.  Follow up as needed 

## 2012-01-28 NOTE — Progress Notes (Signed)
HPI The patient presents for followup of aortic stenosis.  Since I last saw her she has done well.  The patient denies any new symptoms such as chest discomfort, neck or arm discomfort. There has been PND or orthopnea. There have been no reported palpitations, presyncope or syncope.  She has chronic lung disease and is O2 dependent.  She has significant limiting dyspnea from this.  Allergies  Allergen Reactions  . Other     STEROIDS, pt staes she blows up and gains weight    Current Outpatient Prescriptions  Medication Sig Dispense Refill  . acetaminophen (TYLENOL) 500 MG tablet Take 1 tablet (500 mg total) by mouth every 6 (six) hours as needed for pain.      Marland Kitchen albuterol (VENTOLIN HFA) 108 (90 BASE) MCG/ACT inhaler Inhale 2 puffs into the lungs every 6 (six) hours as needed.  1 Inhaler  11  . ALPRAZolam (XANAX) 0.5 MG tablet TAKE ONE TABLET BY MOUTH TWICE DAILY AS NEEDED FOR ANXIETY AND SLEEPLESSNESS  60 tablet  0  . atorvastatin (LIPITOR) 40 MG tablet Take 1 tablet by mouth Daily.      . Azelastine-Fluticasone (DYMISTA) 137-50 MCG/ACT SUSP Place 1 puff into the nose 2 (two) times daily.  23 Bottle  5  . budesonide (PULMICORT) 0.5 MG/2ML nebulizer solution Take 2 mLs (0.5 mg total) by nebulization 2 (two) times daily.  120 mL  11  . buPROPion (WELLBUTRIN SR) 150 MG 12 hr tablet Take 150 mg by mouth daily.      . famotidine (PEPCID) 20 MG tablet Take 1 tablet (20 mg total) by mouth at bedtime as needed (for reflux).      . fenofibrate micronized (LOFIBRA) 134 MG capsule Take 1 capsule (134 mg total) by mouth daily before breakfast.  30 capsule  6  . fexofenadine (ALLEGRA) 180 MG tablet Take 180 mg by mouth daily.      Marland Kitchen ipratropium-albuterol (DUONEB) 0.5-2.5 (3) MG/3ML SOLN Take 3 mLs by nebulization 4 (four) times daily.  360 mL  11  . meclizine (ANTIVERT) 25 MG tablet Take 1 tablet (25 mg total) by mouth every 8 (eight) hours as needed for dizziness. For dizziness      . metoprolol  tartrate (LOPRESSOR) 25 MG tablet Take 0.5 tablets (12.5 mg total) by mouth 2 (two) times daily.  90 tablet  3  . OXYGEN-HELIUM IN Inhale 3 L/min into the lungs at bedtime. Increase with activity with SOB      . Respiratory Therapy Supplies (FLUTTER) DEVI Use as directed  1 each  0  . simvastatin (ZOCOR) 20 MG tablet       . sodium chloride (OCEAN NASAL SPRAY) 0.65 % nasal spray 2 puffs every 4-6 hours as needed for nasal congestion      . valsartan-hydrochlorothiazide (DIOVAN HCT) 80-12.5 MG per tablet Take 1 tablet by mouth daily.  90 tablet  2    Past Medical History  Diagnosis Date  . COPD (chronic obstructive pulmonary disease)   . Oxygen dependent   . Aortic stenosis, mild   . Obesity   . Hypertension   . Anxiety   . Back pain   . Headache   . Breast cancer   . Asthma   . Sleep apnea   . DEMENTIA   . GERD (gastroesophageal reflux disease)   . Arthritis   . Pneumonia   . H/O hiatal hernia   . Depression     Past Surgical History  Procedure  Date  . Cardiac catheterization     EF 55-60%  . Tonsillectomy and adenoidectomy 1942  . Breast reconstruction   . Appendectomy   . Mastectomy 1980    ROS:  As stated in the HPI and negative for all other systems.  PHYSICAL EXAM BP 135/80  Pulse 74  Ht 5\' 6"  (1.676 m)  Wt 180 lb (81.647 kg)  BMI 29.05 kg/m2 GENERAL:  Well appearing NECK:  No jugular venous distention, waveform within normal limits, carotid upstroke brisk and symmetric, no bruits, no thyromegaly LUNGS:  Decreased breath sounds with no wheezing or crackles BACK:  No CVA tenderness HEART:  PMI not displaced or sustained,S1 and S2 within normal limits, no S3, no S4, no clicks, no rubs, 2/6 systolic murmur her best at the left upper sternal border,  Very diminished heart sounds ABD:  Flat, positive bowel sounds normal in frequency in pitch, no bruits, no rebound, no guarding, no midline pulsatile mass, no hepatomegaly, no splenomegaly EXT:  2 plus pulses  throughout, no edema, no cyanosis no clubbing EKG:  Sinus rhythm, rate 72, left axis deviation, left anterior fascicular block, poor anterior R wave progression, no acute ST-T wave changes.  01/28/2012  EKG:  Normal sinus rhythm, rate 74, left axis deviation, no acute ST-T wave changes. 01/28/2012  ASSESSMENT AND PLAN  Aortic stenosis - She had very mild AI as on her echo last year. No further workup is suggested. No change in therapy is indicated.  Of note she has mild septal hypertrophy.  HTN (hypertension) -  Her blood pressure is well controlled and she will continue the meds as listed.

## 2012-02-11 ENCOUNTER — Ambulatory Visit: Payer: Medicare Other | Admitting: Cardiology

## 2012-02-12 ENCOUNTER — Ambulatory Visit (INDEPENDENT_AMBULATORY_CARE_PROVIDER_SITE_OTHER): Payer: Medicare Other | Admitting: Adult Health

## 2012-02-12 ENCOUNTER — Encounter: Payer: Self-pay | Admitting: Adult Health

## 2012-02-12 VITALS — BP 144/74 | HR 90 | Temp 98.3°F | Wt 180.4 lb

## 2012-02-12 DIAGNOSIS — J449 Chronic obstructive pulmonary disease, unspecified: Secondary | ICD-10-CM

## 2012-02-12 DIAGNOSIS — J4489 Other specified chronic obstructive pulmonary disease: Secondary | ICD-10-CM

## 2012-02-12 MED ORDER — ALPRAZOLAM 1 MG PO TABS: 1.0000 mg | ORAL_TABLET | Freq: Two times a day (BID) | ORAL | Status: DC | PRN

## 2012-02-12 NOTE — Addendum Note (Signed)
Addended by: Boone Master E on: 02/12/2012 05:02 PM   Modules accepted: Orders

## 2012-02-12 NOTE — Progress Notes (Signed)
Subjective:     Patient ID: Katie Bryant, female   DOB: April 03, 1928    MRN: [id number]  HPI 59 yowf quit smoking 1992 with GOLD III copd dx 2009  followed in [facility] p hospitalized at [facility]   Admit date: 12/21/2010  Discharge date: 12/24/2010  C O P D  ALLERGIC RHINITIS  DYSPNEA  G E R D  Aortic stenosis  Insomnia  Leukocytosis        01/21/2011 f/u ov/Wert cc  baseline doe Katie Bryant off 02 and on 02 can do the whole store s stopping at 3lpm but discharged on 4lpm does not wear it 24 hours 25% of the time. Uses multiple forms of albuterol. Cough is better, sputum mucoid.  >no changes, med rec next step   02/03/11 Follow up and med review Pt returns for follow up and med review.  We reviewed all her meds and organized them into a med calendar with pt education .  She appears to be taking her meds correctly.  She has returned back to her baseline from recent PNA episode last month. CXR c/w complete clearance   rec follow med calendar   03/11/2011 f/u ov/Wert cc ok x nose bleeds ? On 02 has calendar but not using it, ad libbing on 02 but overall feels making slow progress with return to nl activity tol and really no limiting sob or cough or need for daytime 02 rec Ok to leave 02 off at rest    06/20/2011 f/u ov/Wert cc 2-3 weeks  increased cough, and sneezing > mucus turned thick yellow rx abx and steroids x 2 some better but very confused with meds/ names/ not following action plans at bottom of calendar.  Sleeping poorly with nocturnal  And  early am exacerbation  of respiratory  c/o's and  need for noct saba. rec Prednisone 10 mg take  4 each am x 2 days,   2 each am x 2 days,  1 each am x2days and stop  Add pepcid 20 mg one at bedtime (Zantac/ranitidine) 150 mg) one at bedtime GERD diet reviewed Flutter valve should help bring up mucus - use it as much as your want Keep Follow up [facility] office Tuesday June 18  Late add: Clarify 02 use for problem list at next ov     06/24/2011 f/u ov/Wert cc breathing some better but thoroughly confused with instructions/ names of meds/ lost calendar we reviwed just 4 days  prior to OV  - husband came with her today also confused with details of care.  Cough much looser with flutter, producing less thick white mucus only,  not sure which mucinex product she's using, hfa technique quite poor. Did not follow instructions on adding H2  at bedtime "using an acid reducer as needed"  >>budesonide Twice daily  And taper steroids to off   07/09/2011 Follow up and Med review  Returns after recent COPD flare -now resolved and back to her baseline  We reviewed all her meds and organized them into a med calendar w/ pt edcuation  Recent changes were reviewed.  rec Follow med calendar closely and bring to each visit   08/12/2011 f/u ov/Wert cc breathing about the same, minimal cough, thoroughly confused again with instructions and meds, recognizes the calendar we gave her and remembers receiving it but didn't bring it and not using it.   No unusual cough, purulent sputum or HB symptoms on present rx. Does have chronic nasal obst symptoms esp  at hs takes otc doesn't know name.  >>no changes   08/27/2011    Follow up and med calendar  We reviewed all her meds and organized them into a med calendar w/ pt edcuation  She says she got her old med calendar at last ov b/c she was in a hurry Have suggested she throw away old forms if not needed.  She appears to be taking meds correctly Says she is doing well. No flare in dyspnea or cough No fever or chest pain rec No change in rx > follow calendar  09/29/2011 f/u ov/Wert has med calendar and doing well until 2 weeks prior to OV  with worse nasal congestion and green mucus production. Had been using allegra daily x years and still having lots of watery rhinitis "all year long" refractory to flonase.   Still no increase need for rescue saba daytime rec Augmentin 875 twice daily x 10 days   Prednisone 10 mg take  4 each am x 2 days,   2 each am x 2 days,  1 each am x2days and stop  Start Dymista one twice daily  Please see patient coordinator before you leave today  to schedule nebs refills through [facility] See calendar for specific medication instructions   10/07/2011 f/u ov/Wert did not bring calendar given just 10 days prior to OV  But says she's following it and breathing and all resp cc's quite a bit better including her watery rhinitis.  Just completing abx, not sure she ever got her prednsone that was called in on previous ov. x - still very confused with names of meds, esp inhalers and nebs. rec Use med calendar No change rx  11/11/2011 f/u ov/Wert / [name] with med calendar in hand cc nasal congestion and "nose runs like a faucet" watery esp in ams despite prn allegra.  Still struggling with concept of medication reconciliation but doing some better and using xopenex about twice  Daily, her avg.  rec Chlortrimeton 4 mg at bedtime automatically and again  every 6 hours as needed for stuffy nose, runny nose (instead of allegra)  Take the budesonide along with your morning treatments No obvious daytime variabilty or assoc chronic cough or cp or chest tightness, subjective wheeze overt sinus or hb symptoms. No unusual exp hx   01/13/2012 f/u ov/Wert cc still confused with meds, not following prev instructions re 1st gen H1 re nasal symptoms has calendar but not using it. Variably sob room to room.  No purulent sputum overt sinus or hb symptoms. Some better with saba.   >>pred /chlor tabs   02/12/2012  Follow up and Med review     Patient returns for a one-month followup and medication review. Last visit. Patient with a COPD exacerbation, and acute rhinitis. Patient was treated with a steroid taper and advised adding chlor tabs. Patient returns today with some improvement with decreased cough, and drainage. We reviewed all her medications and organized them into a medication  calendar with patient education. It does appear that she is taking her medications correctly. However, she does have some difficulty with pronunciation of the medications. She realizes quite a bit on how the medication works and what the bottle says She denies any hemoptysis, orthopnea, PND, or leg swelling   ROS  Constitutional:   No  weight loss, night sweats,  Fevers, chills, +fatigue, or  lassitude.  HEENT:   No headaches,  Difficulty swallowing,  Tooth/dental problems, or  Sore throat,  No sneezing, itching, ear ache,  +nasal congestion, post nasal drip,   CV:  No chest pain,  Orthopnea, PND, swelling in lower extremities, anasarca, dizziness, palpitations, syncope.   GI  No heartburn, indigestion, abdominal pain, nausea, vomiting, diarrhea, change in bowel habits, loss of appetite, bloody stools.    RESP  No chest wall deformity  Skin: no rash or lesions.  GU: no dysuria, change in color of urine, no urgency or frequency.  No flank pain, no hematuria   MS:  No joint pain or swelling.  No decreased range of motion.  No back pain.  Psych:  No change in mood or affect. No depression or anxiety.  No memory loss.                Objective:   Physical Exam Pleasant amb wf nad Wt 184 01/21/2011  >08/27/2011 186 > 180 09/29/2011 > 10/07/2011  183 > 11/11/2011  181 > 01/13/2012  183 >180 02/12/2012  HEENT mod non-specific turbinate edema.  Oropharynx no thrush or excess pnd or cobblestoning.  No JVD or cervical adenopathy. Mild accessory muscle hypertrophy. Trachea midline, nl thryroid. Chest was hyperinflated by percussion with diminished breath sounds Regular rate and rhythm without murmur gallop or rub or increase P2 or edema.  Abd: no hsm, nl excursion. Ext warm without cyanosis or clubbing.       CXR 02/03/11  Interval resolution of the previously seen infiltrates/edema  with stable chronic changes.    Assessment:         Plan:

## 2012-02-12 NOTE — Assessment & Plan Note (Signed)
Compensated on present regimen  Patient's medications were reviewed today and patient education was given. Computerized medication calendar was adjusted/completed    

## 2012-02-12 NOTE — Patient Instructions (Addendum)
Follow med calendar closely and bring to each visit.  Follow up Dr. Sherene Sires  At  [facility] in 1 month and As needed

## 2012-02-13 ENCOUNTER — Telehealth: Payer: Self-pay | Admitting: Internal Medicine

## 2012-02-13 DIAGNOSIS — J449 Chronic obstructive pulmonary disease, unspecified: Secondary | ICD-10-CM

## 2012-02-13 NOTE — Telephone Encounter (Signed)
Pt stated that she needs new rx sent in to [facility] for nebulizer machine.  MW please advise if ok to send in this order.  Pt stated that she does not need any meds at this time.  Thanks  Allergies  Allergen Reactions  . Other     STEROIDS, pt staes she blows up and gains weight

## 2012-02-13 NOTE — Telephone Encounter (Signed)
Ok with me 

## 2012-02-16 NOTE — Telephone Encounter (Signed)
Order has been sent to [facility]. Pt aware

## 2012-02-27 ENCOUNTER — Other Ambulatory Visit: Payer: Self-pay | Admitting: *Deleted

## 2012-02-27 MED ORDER — METOPROLOL TARTRATE 25 MG PO TABS: 12.5000 mg | ORAL_TABLET | Freq: Two times a day (BID) | ORAL | Status: DC

## 2012-02-27 NOTE — Telephone Encounter (Signed)
Fax Received. Refill Completed. Alexandrya Chim Chowoe (R.M.A)  See dr. Antoine Poche but metoprolol fills by Dr. Elease Hashimoto

## 2012-03-09 ENCOUNTER — Other Ambulatory Visit (HOSPITAL_COMMUNITY): Payer: Self-pay | Admitting: Family Medicine

## 2012-03-15 ENCOUNTER — Ambulatory Visit (HOSPITAL_COMMUNITY)
Admission: RE | Admit: 2012-03-15 | Discharge: 2012-03-15 | Disposition: A | Discharge status: Home / Self Care | Payer: Medicare Other | Source: Ambulatory Visit | Attending: Family Medicine | Admitting: Family Medicine

## 2012-03-15 ENCOUNTER — Other Ambulatory Visit (HOSPITAL_COMMUNITY): Payer: Self-pay | Admitting: Family Medicine

## 2012-03-15 DIAGNOSIS — I6529 Occlusion and stenosis of unspecified carotid artery: Secondary | ICD-10-CM | POA: Insufficient documentation

## 2012-03-15 DIAGNOSIS — R0989 Other specified symptoms and signs involving the circulatory and respiratory systems: Secondary | ICD-10-CM | POA: Insufficient documentation

## 2012-03-15 DIAGNOSIS — R42 Dizziness and giddiness: Secondary | ICD-10-CM | POA: Insufficient documentation

## 2012-03-25 ENCOUNTER — Ambulatory Visit (INDEPENDENT_AMBULATORY_CARE_PROVIDER_SITE_OTHER): Payer: Medicare Other | Admitting: Family Medicine

## 2012-03-25 ENCOUNTER — Encounter: Payer: Self-pay | Admitting: Family Medicine

## 2012-03-25 VITALS — BP 162/94 | HR 71 | Temp 98.5°F | Ht 66.0 in | Wt 182.0 lb

## 2012-03-25 DIAGNOSIS — L0202 Furuncle of face: Secondary | ICD-10-CM

## 2012-03-25 DIAGNOSIS — L0203 Carbuncle of face: Secondary | ICD-10-CM

## 2012-03-25 DIAGNOSIS — Z8614 Personal history of Methicillin resistant Staphylococcus aureus infection: Secondary | ICD-10-CM

## 2012-03-25 MED ORDER — SULFAMETHOXAZOLE-TRIMETHOPRIM 800-160 MG PO TABS: 1.0000 | ORAL_TABLET | Freq: Two times a day (BID) | ORAL | Status: DC

## 2012-03-25 NOTE — Progress Notes (Signed)
Subjective:     Patient ID: Katie Bryant, female   DOB: 06-29-1928, 79 y.o.   MRN: [id number]  HPI  Patient has a past history of MRSA infection. She has had a boil under her chin in the past. This boil had to be incised and drained. She was shaved in her chin because of her hirsutism. And make to her chin and a bump came up and it's been growing and getting very tender. She didn't want to wait for it to become a boil and become more serious with MRSA . Otherwise she is at her baseline. Past Medical History  Diagnosis Date  . COPD (chronic obstructive pulmonary disease)   . Oxygen dependent   . Aortic stenosis, mild   . Obesity   . Hypertension   . Anxiety   . Back pain   . Headache   . Breast cancer   . Asthma   . Sleep apnea   . DEMENTIA   . GERD (gastroesophageal reflux disease)   . Arthritis   . Pneumonia   . H/O hiatal hernia   . Depression    Past Surgical History  Procedure Laterality Date  . Cardiac catheterization      EF 55-60%  . Tonsillectomy and adenoidectomy  1942  . Breast reconstruction    . Appendectomy    . Mastectomy  1980   History   Social History  . Marital Status: Married    Spouse Name: N/A    Number of Children: N/A  . Years of Education: N/A   Occupational History  . Not on file.   Social History Main Topics  . Smoking status: Former Smoker -- 1.00 packs/day for 40 years    Types: Cigarettes    Quit date: 02/11/1990  . Smokeless tobacco: Never Used  . Alcohol Use: 4.2 oz/week    7 Glasses of wine per week  . Drug Use: No  . Sexually Active: Not Currently   Other Topics Concern  . Not on file   Social History Narrative  . No narrative on file   Family History  Problem Relation Age of Onset  . Tuberculosis Mother   . Lung cancer Father    Current Outpatient Prescriptions on File Prior to Visit  Medication Sig Dispense Refill  . albuterol (PROVENTIL HFA;VENTOLIN HFA) 108 (90 BASE) MCG/ACT inhaler 2 puffs every 4-6 hours  as needed      . ALPRAZolam (XANAX) 1 MG tablet Take 1 tablet (1 mg total) by mouth 2 (two) times daily as needed for anxiety.  60 tablet  0  . atorvastatin (LIPITOR) 40 MG tablet Take 1 tablet by mouth Daily.      . budesonide (PULMICORT) 0.5 MG/2ML nebulizer solution Take 2 mLs (0.5 mg total) by nebulization 2 (two) times daily.  120 mL  11  . buPROPion (WELLBUTRIN SR) 150 MG 12 hr tablet Take 150 mg by mouth daily.      . chlorpheniramine (CHLOR-TRIMETON) 4 MG tablet 1 tab by mouth daily at bedtime.  May add 1 every 6 hours as needed for drippy nose.      . fenofibrate micronized (LOFIBRA) 134 MG capsule Take 1 capsule (134 mg total) by mouth daily before breakfast.  30 capsule  6  . guaiFENesin (MUCINEX) 600 MG 12 hr tablet 1-2 in AM as needed      . guaiFENesin-dextromethorphan (ROBITUSSIN DM) 100-10 MG/5ML syrup 1-2 tsp at bedtime as needed      . ipratropium-albuterol (DUONEB)  0.5-2.5 (3) MG/3ML SOLN Take 3 mLs by nebulization 4 (four) times daily.  360 mL  11  . meclizine (ANTIVERT) 25 MG tablet Take 1 tablet (25 mg total) by mouth every 8 (eight) hours as needed for dizziness. For dizziness      . metoprolol tartrate (LOPRESSOR) 25 MG tablet Take 0.5 tablets (12.5 mg total) by mouth 2 (two) times daily.  90 tablet  4  . OXYGEN-HELIUM IN Wear 3 lpm continuously and increase to to 4 lpm as needed for shortness of breath w/ activity      . Respiratory Therapy Supplies (FLUTTER) DEVI Use as directed  1 each  0  . sodium chloride (OCEAN NASAL SPRAY) 0.65 % nasal spray 2 puffs every 4-6 hours as needed for nasal congestion      . valsartan-hydrochlorothiazide (DIOVAN HCT) 80-12.5 MG per tablet Take 1 tablet by mouth daily.  90 tablet  2  . famotidine (PEPCID) 20 MG tablet Take 1 tablet (20 mg total) by mouth at bedtime as needed (for reflux).       No current facility-administered medications on file prior to visit.   Allergies  Allergen Reactions  . Other     STEROIDS, pt staes she blows  up and gains weight   Immunization History  Administered Date(s) Administered  . Influenza Split 10/07/2010  . Influenza Whole 10/07/2011  . Pneumococcal Polysaccharide 01/06/2009   Prior to Admission medications   Medication Sig Start Date End Date Taking? Authorizing Provider  albuterol (PROVENTIL HFA;VENTOLIN HFA) 108 (90 BASE) MCG/ACT inhaler 2 puffs every 4-6 hours as needed 06/20/11  Yes Nyoka Cowden, MD  ALPRAZolam Prudy Feeler) 1 MG tablet Take 1 tablet (1 mg total) by mouth 2 (two) times daily as needed for anxiety. 02/12/12  Yes Tammy S Parrett, NP  atorvastatin (LIPITOR) 40 MG tablet Take 1 tablet by mouth Daily. 12/20/11  Yes Historical Provider, MD  budesonide (PULMICORT) 0.5 MG/2ML nebulizer solution Take 2 mLs (0.5 mg total) by nebulization 2 (two) times daily. 10/15/11  Yes Nyoka Cowden, MD  buPROPion [facility] SR) 150 MG 12 hr tablet Take 150 mg by mouth daily. 02/12/11  Yes Tammy S Parrett, NP  chlorpheniramine (CHLOR-TRIMETON) 4 MG tablet 1 tab by mouth daily at bedtime.  May add 1 every 6 hours as needed for drippy nose.   Yes Historical Provider, MD  fenofibrate micronized (LOFIBRA) 134 MG capsule Take 1 capsule (134 mg total) by mouth daily before breakfast. 11/19/11  Yes Rollene Rotunda, MD  guaiFENesin (MUCINEX) 600 MG 12 hr tablet 1-2 in AM as needed   Yes Historical Provider, MD  guaiFENesin-dextromethorphan (ROBITUSSIN DM) 100-10 MG/5ML syrup 1-2 tsp at bedtime as needed   Yes Historical Provider, MD  ipratropium-albuterol (DUONEB) 0.5-2.5 (3) MG/3ML SOLN Take 3 mLs by nebulization 4 (four) times daily. 10/15/11  Yes Nyoka Cowden, MD  meclizine (ANTIVERT) 25 MG tablet Take 1 tablet (25 mg total) by mouth every 8 (eight) hours as needed for dizziness. For dizziness 02/12/11  Yes Tammy S Parrett, NP  metoprolol tartrate (LOPRESSOR) 25 MG tablet Take 0.5 tablets (12.5 mg total) by mouth 2 (two) times daily. 02/27/12  Yes Vesta Mixer, MD  OXYGEN-HELIUM IN Wear 3 lpm  continuously and increase to to 4 lpm as needed for shortness of breath w/ activity   Yes Historical Provider, MD  Respiratory Therapy Supplies (FLUTTER) DEVI Use as directed 06/20/11  Yes Nyoka Cowden, MD  sodium chloride (OCEAN NASAL SPRAY) 0.65 %  nasal spray 2 puffs every 4-6 hours as needed for nasal congestion 02/12/11  Yes Tammy S Parrett, NP  valsartan-hydrochlorothiazide (DIOVAN HCT) 80-12.5 MG per tablet Take 1 tablet by mouth daily. 03/03/11  Yes Rollene Rotunda, MD  famotidine (PEPCID) 20 MG tablet Take 1 tablet (20 mg total) by mouth at bedtime as needed (for reflux). 02/12/11 02/12/12  Tammy S Parrett, NP  sulfamethoxazole-trimethoprim (BACTRIM DS,SEPTRA DS) 800-160 MG per tablet Take 1 tablet by mouth 2 (two) times daily. 03/25/12   Ileana Ladd, MD    Review of Systems All other systems are at status quo  Objective:   Physical Exam BP 162/94  Pulse 71  Temp(Src) 98.5 F (36.9 C) (Oral)  Ht 5\' 6"  (1.676 m)  Wt 182 lb (82.555 kg)  BMI 29.39 kg/m2  SpO2 94% Patient has portable oxygen she has exertional dyspnea which is not new. On examination she appeared in good spirits. Well developed, well nourished. Vital signs as documented.  Skin warm and dry and without overt rashes. Head & Neck without JVD carotid bruits no change. On her chin there is a 4 mm red tender papule. It is nonfluctuant. It is not pointing. And it appears to be an early skin infection Lungs clear. Coarse breath sounds patient at her baseline  Heart exam notable for regular rhythm, normal sounds and absence of murmurs, rubs or gallops. Abdomen unremarkable and without evidence of organomegaly, masses, or abdominal aortic enlargement.  Breast exam: not performed. Gyn Exam: Not performed. External Genitalia: Vagina:: Cervix: Uterus: Adnexae: R/V: Extremities nonedematous.    Assessment:     Furuncle of chin  History of MRSA infection        Plan:     Warm compresses to the chin. If this gets  worse or fluctuance called stat for consideration for incision and drainage. We'll call in a prescription for Bactrim DS one twice a day for 10 days. If this resolves the skin infection routine followup as previously planned. No orders of the defined types were placed in this encounter.        Corderro Koloski P. [place]esto Charon, M.D.

## 2012-04-08 ENCOUNTER — Telehealth: Payer: Self-pay | Admitting: Family Medicine

## 2012-04-08 ENCOUNTER — Other Ambulatory Visit: Payer: Self-pay | Admitting: Family Medicine

## 2012-04-08 MED ORDER — SULFAMETHOXAZOLE-TRIMETHOPRIM 800-160 MG PO TABS: 1.0000 | ORAL_TABLET | Freq: Two times a day (BID) | ORAL | Status: DC

## 2012-04-08 NOTE — Telephone Encounter (Signed)
Ordered through Epic to her Pharmacy.

## 2012-04-08 NOTE — Telephone Encounter (Signed)
Notified pt stated another spot on arm .  She was treated with sulfthazole on March 25 2012 with 20 tabs . Wants to know if you will refill again to [facility]  Thanks gina

## 2012-04-08 NOTE — Telephone Encounter (Signed)
Pt notified to check with her pharmacy

## 2012-04-23 ENCOUNTER — Other Ambulatory Visit: Payer: Self-pay | Admitting: Family Medicine

## 2012-05-03 ENCOUNTER — Other Ambulatory Visit: Payer: Self-pay | Admitting: Adult Health

## 2012-05-10 ENCOUNTER — Ambulatory Visit (INDEPENDENT_AMBULATORY_CARE_PROVIDER_SITE_OTHER): Payer: Medicare Other | Admitting: Family Medicine

## 2012-05-10 ENCOUNTER — Encounter: Payer: Self-pay | Admitting: Family Medicine

## 2012-05-10 VITALS — BP 151/68 | HR 98 | Temp 97.2°F | Ht 65.0 in | Wt 182.0 lb

## 2012-05-10 DIAGNOSIS — J449 Chronic obstructive pulmonary disease, unspecified: Secondary | ICD-10-CM

## 2012-05-10 DIAGNOSIS — J4489 Other specified chronic obstructive pulmonary disease: Secondary | ICD-10-CM

## 2012-05-10 DIAGNOSIS — R350 Frequency of micturition: Secondary | ICD-10-CM

## 2012-05-10 DIAGNOSIS — K219 Gastro-esophageal reflux disease without esophagitis: Secondary | ICD-10-CM

## 2012-05-10 DIAGNOSIS — J9610 Chronic respiratory failure, unspecified whether with hypoxia or hypercapnia: Secondary | ICD-10-CM

## 2012-05-10 DIAGNOSIS — E785 Hyperlipidemia, unspecified: Secondary | ICD-10-CM

## 2012-05-10 DIAGNOSIS — IMO0001 Reserved for inherently not codable concepts without codable children: Secondary | ICD-10-CM

## 2012-05-10 DIAGNOSIS — I10 Essential (primary) hypertension: Secondary | ICD-10-CM

## 2012-05-10 DIAGNOSIS — E669 Obesity, unspecified: Secondary | ICD-10-CM

## 2012-05-10 DIAGNOSIS — I350 Nonrheumatic aortic (valve) stenosis: Secondary | ICD-10-CM

## 2012-05-10 DIAGNOSIS — G4700 Insomnia, unspecified: Secondary | ICD-10-CM

## 2012-05-10 DIAGNOSIS — R300 Dysuria: Secondary | ICD-10-CM

## 2012-05-10 DIAGNOSIS — I359 Nonrheumatic aortic valve disorder, unspecified: Secondary | ICD-10-CM

## 2012-05-10 LAB — POCT UA - MICROSCOPIC ONLY
Bacteria, U Microscopic: NEGATIVE
Casts, Ur, LPF, POC: NEGATIVE
Crystals, Ur, HPF, POC: NEGATIVE
Mucus, UA: NEGATIVE
Yeast, UA: NEGATIVE

## 2012-05-10 LAB — POCT URINALYSIS DIPSTICK
Bilirubin, UA: NEGATIVE
Blood, UA: NEGATIVE
Glucose, UA: NEGATIVE
Ketones, UA: NEGATIVE
Nitrite, UA: NEGATIVE
Protein, UA: NEGATIVE
Spec Grav, UA: 1.02
Urobilinogen, UA: NEGATIVE
pH, UA: 6

## 2012-05-10 MED ORDER — CIPROFLOXACIN HCL 250 MG PO TABS: 250.0000 mg | ORAL_TABLET | Freq: Two times a day (BID) | ORAL | Status: DC

## 2012-05-10 NOTE — Progress Notes (Signed)
Subjective:     Patient ID: Katie Bryant, female   DOB: 30-Nov-1928, 79 y.o.   MRN: [id number]  HPI  Arthritis flare up.in the right hip and right knee. Uses Biofreeze and it helps the hip and knee.  Having dysuria   As well.  Upset with husband today. BP is up. Usually 130s.   Past Medical History  Diagnosis Date  . COPD (chronic obstructive pulmonary disease)   . Oxygen dependent   . Aortic stenosis, mild   . Obesity   . Hypertension   . Anxiety   . Back pain   . Headache   . Breast cancer   . Asthma   . Sleep apnea   . DEMENTIA   . GERD (gastroesophageal reflux disease)   . Arthritis   . Pneumonia   . H/O hiatal hernia   . Depression    Past Surgical History  Procedure Laterality Date  . Cardiac catheterization      EF 55-60%  . Tonsillectomy and adenoidectomy  1942  . Breast reconstruction    . Appendectomy    . Mastectomy  1980   History   Social History  . Marital Status: Married    Spouse Name: N/A    Number of Children: N/A  . Years of Education: N/A   Occupational History  . Not on file.   Social History Main Topics  . Smoking status: Former Smoker -- 1.00 packs/day for 40 years    Types: Cigarettes    Quit date: 02/11/1990  . Smokeless tobacco: Never Used  . Alcohol Use: 4.2 oz/week    7 Glasses of wine per week  . Drug Use: No  . Sexually Active: Not Currently   Other Topics Concern  . Not on file   Social History Narrative  . No narrative on file   Family History  Problem Relation Age of Onset  . Tuberculosis Mother   . Lung cancer Father    Current Outpatient Prescriptions on File Prior to Visit  Medication Sig Dispense Refill  . albuterol (PROVENTIL HFA;VENTOLIN HFA) 108 (90 BASE) MCG/ACT inhaler 2 puffs every 4-6 hours as needed      . ALPRAZolam (XANAX) 1 MG tablet TAKE ONE TABLET BY MOUTH TWICE DAILY AS NEEDED  60 tablet  0  . atorvastatin (LIPITOR) 40 MG tablet TAKE ONE TABLET BY MOUTH EVERY DAY  30 tablet  4  .  budesonide (PULMICORT) 0.5 MG/2ML nebulizer solution Take 2 mLs (0.5 mg total) by nebulization 2 (two) times daily.  120 mL  11  . buPROPion (WELLBUTRIN SR) 150 MG 12 hr tablet Take 150 mg by mouth daily.      . chlorpheniramine (CHLOR-TRIMETON) 4 MG tablet 1 tab by mouth daily at bedtime.  May add 1 every 6 hours as needed for drippy nose.      . fenofibrate micronized (LOFIBRA) 134 MG capsule Take 1 capsule (134 mg total) by mouth daily before breakfast.  30 capsule  6  . guaiFENesin (MUCINEX) 600 MG 12 hr tablet 1-2 in AM as needed      . guaiFENesin-dextromethorphan (ROBITUSSIN DM) 100-10 MG/5ML syrup 1-2 tsp at bedtime as needed      . ipratropium-albuterol (DUONEB) 0.5-2.5 (3) MG/3ML SOLN Take 3 mLs by nebulization 4 (four) times daily.  360 mL  11  . meclizine (ANTIVERT) 25 MG tablet Take 1 tablet (25 mg total) by mouth every 8 (eight) hours as needed for dizziness. For dizziness      .  metoprolol tartrate (LOPRESSOR) 25 MG tablet Take 0.5 tablets (12.5 mg total) by mouth 2 (two) times daily.  90 tablet  4  . OXYGEN-HELIUM IN Wear 3 lpm continuously and increase to to 4 lpm as needed for shortness of breath w/ activity      . Respiratory Therapy Supplies (FLUTTER) DEVI Use as directed  1 each  0  . sodium chloride (OCEAN NASAL SPRAY) 0.65 % nasal spray 2 puffs every 4-6 hours as needed for nasal congestion      . sulfamethoxazole-trimethoprim (BACTRIM DS,SEPTRA DS) 800-160 MG per tablet Take 1 tablet by mouth 2 (two) times daily.  20 tablet  0  . valsartan-hydrochlorothiazide (DIOVAN HCT) 80-12.5 MG per tablet Take 1 tablet by mouth daily.  90 tablet  2  . famotidine (PEPCID) 20 MG tablet Take 1 tablet (20 mg total) by mouth at bedtime as needed (for reflux).       No current facility-administered medications on file prior to visit.   Allergies  Allergen Reactions  . Other     STEROIDS, pt staes she blows up and gains weight   Immunization History  Administered Date(s) Administered  .  Influenza Split 10/07/2010  . Influenza Whole 10/07/2011  . Pneumococcal Polysaccharide 01/06/2009   Prior to Admission medications   Medication Sig Start Date End Date Taking? Authorizing Provider  albuterol (PROVENTIL HFA;VENTOLIN HFA) 108 (90 BASE) MCG/ACT inhaler 2 puffs every 4-6 hours as needed 06/20/11  Yes Nyoka Cowden, MD  ALPRAZolam Prudy Feeler) 1 MG tablet TAKE ONE TABLET BY MOUTH TWICE DAILY AS NEEDED 05/03/12  Yes Nyoka Cowden, MD  aspirin 81 MG tablet Take 81 mg by mouth daily.   Yes Historical Provider, MD  atorvastatin (LIPITOR) 40 MG tablet TAKE ONE TABLET BY MOUTH EVERY DAY 04/23/12  Yes Ileana Ladd, MD  budesonide (PULMICORT) 0.5 MG/2ML nebulizer solution Take 2 mLs (0.5 mg total) by nebulization 2 (two) times daily. 10/15/11  Yes Nyoka Cowden, MD  buPROPion [facility] SR) 150 MG 12 hr tablet Take 150 mg by mouth daily. 02/12/11  Yes Tammy S Parrett, NP  chlorpheniramine (CHLOR-TRIMETON) 4 MG tablet 1 tab by mouth daily at bedtime.  May add 1 every 6 hours as needed for drippy nose.   Yes Historical Provider, MD  fenofibrate micronized (LOFIBRA) 134 MG capsule Take 1 capsule (134 mg total) by mouth daily before breakfast. 11/19/11  Yes Rollene Rotunda, MD  fish oil-omega-3 fatty acids 1000 MG capsule Take 1 g by mouth daily.   Yes Historical Provider, MD  guaiFENesin (MUCINEX) 600 MG 12 hr tablet 1-2 in AM as needed   Yes Historical Provider, MD  guaiFENesin-dextromethorphan (ROBITUSSIN DM) 100-10 MG/5ML syrup 1-2 tsp at bedtime as needed   Yes Historical Provider, MD  ipratropium-albuterol (DUONEB) 0.5-2.5 (3) MG/3ML SOLN Take 3 mLs by nebulization 4 (four) times daily. 10/15/11  Yes Nyoka Cowden, MD  meclizine (ANTIVERT) 25 MG tablet Take 1 tablet (25 mg total) by mouth every 8 (eight) hours as needed for dizziness. For dizziness 02/12/11  Yes Tammy S Parrett, NP  metoprolol tartrate (LOPRESSOR) 25 MG tablet Take 0.5 tablets (12.5 mg total) by mouth 2 (two) times daily. 02/27/12   Yes Vesta Mixer, MD  OXYGEN-HELIUM IN Wear 3 lpm continuously and increase to to 4 lpm as needed for shortness of breath w/ activity   Yes Historical Provider, MD  Respiratory Therapy Supplies (FLUTTER) DEVI Use as directed 06/20/11  Yes Nyoka Cowden, MD  sodium chloride (OCEAN NASAL SPRAY) 0.65 % nasal spray 2 puffs every 4-6 hours as needed for nasal congestion 02/12/11  Yes Tammy S Parrett, NP  sulfamethoxazole-trimethoprim (BACTRIM DS,SEPTRA DS) 800-160 MG per tablet Take 1 tablet by mouth 2 (two) times daily. 04/08/12  Yes Ileana Ladd, MD  valsartan-hydrochlorothiazide (DIOVAN HCT) 80-12.5 MG per tablet Take 1 tablet by mouth daily. 03/03/11  Yes Rollene Rotunda, MD  famotidine (PEPCID) 20 MG tablet Take 1 tablet (20 mg total) by mouth at bedtime as needed (for reflux). 02/12/11 02/12/12  Julio Sicks, NP    Review of Systems No other complaints. Patient mainly upset with her husbands behaviour and attitude at home.    Objective:   Physical Exam APPEARANCE:  White female on O2 via nasal cannula Patient in no acute distress.The patient appeared well nourished and normally developed. Acyanotic. Waist: VITAL SIGNS:BP 151/68  Pulse 98  Temp(Src) 97.2 F (36.2 C) (Oral)  Ht 5\' 5"  (1.651 m)  Wt 182 lb (82.555 kg)  BMI 30.29 kg/m2  SpO2 94%   SKIN: warm and  Dry without overt rashes, tattoos and scars  HEAD and Neck: without JVD, Head and scalp: normal Eyes:No scleral icterus. Fundi normal, eye movements normal. Ears: Auricle normal, canal normal, Tympanic membranes normal, insufflation normal. Nose: normal Throat: normal Neck & thyroid: normal  CHEST & LUNGS: Chest wall: normal Lungs: coarse BS. CVS: Reveals the PMI to be normally located. Regular rhythm, First and Second Heart sounds are normal,  2/6 systolic  Murmur at the Aortic area., no rubs nor gallops.  ABDOMEN:  Appearance: normal Benign,, no organomegaly, no masses, no Abdominal Aortic enlargement. No  Guarding , no rebound. No Bruits. Bowel sounds: normal  RECTAL: N/A GU: N/A  EXTREMETIES: nonedematous. Both Femoral and Pedal pulses are normal.  MUSCULOSKELETAL:  Spine: normal Joints: intact  NEUROLOGIC: oriented to time,place and person; nonfocal.    Assessment:     Frequency - Plan: POCT urinalysis dipstick, POCT UA - Microscopic Only, Urine culture  Dysuria - Plan: ciprofloxacin (CIPRO) 250 MG tablet  OBESITY  Insomnia  Hyperlipidemia  HTN (hypertension)  G E R D  Chronic respiratory failure  C O P D  Aortic stenosis        Plan:     Orders Placed This Encounter  Procedures  . Urine culture  . POCT urinalysis dipstick  . POCT UA - Microscopic Only   Meds ordered this encounter  Medications  . aspirin 81 MG tablet    Sig: Take 81 mg by mouth daily.  . fish oil-omega-3 fatty acids 1000 MG capsule    Sig: Take 1 g by mouth daily.  . ciprofloxacin (CIPRO) 250 MG tablet    Sig: Take 1 tablet (250 mg total) by mouth 2 (two) times daily.    Dispense:  6 tablet    Refill:  0    Recheck BP in 2 weeks  Stress reduction.  Coping skills discussed.  Taeja Debellis P. Modesto Charon, M.D.

## 2012-05-11 ENCOUNTER — Ambulatory Visit: Payer: Medicare Other | Admitting: Internal Medicine

## 2012-05-14 ENCOUNTER — Telehealth: Payer: Self-pay | Admitting: Family Medicine

## 2012-05-14 DIAGNOSIS — R300 Dysuria: Secondary | ICD-10-CM

## 2012-05-17 NOTE — Telephone Encounter (Signed)
Spoke with pt stated she completed antiobiotic and still came back Per dr Modesto Charon call in cipro to [facility] Pt aware med called in

## 2012-05-19 MED ORDER — CIPROFLOXACIN HCL 250 MG PO TABS: 250.0000 mg | ORAL_TABLET | Freq: Two times a day (BID) | ORAL | Status: DC

## 2012-05-24 ENCOUNTER — Telehealth: Payer: Self-pay | Admitting: Family Medicine

## 2012-05-24 NOTE — Telephone Encounter (Signed)
APPT MADE

## 2012-05-25 ENCOUNTER — Ambulatory Visit: Payer: Medicare Other | Admitting: Family Medicine

## 2012-06-07 ENCOUNTER — Ambulatory Visit (INDEPENDENT_AMBULATORY_CARE_PROVIDER_SITE_OTHER): Payer: Medicare Other | Admitting: Family Medicine

## 2012-06-07 ENCOUNTER — Encounter: Payer: Self-pay | Admitting: Family Medicine

## 2012-06-07 ENCOUNTER — Telehealth: Payer: Self-pay | Admitting: Family Medicine

## 2012-06-07 VITALS — BP 157/71 | HR 88 | Temp 98.2°F | Wt 188.4 lb

## 2012-06-07 DIAGNOSIS — N8111 Cystocele, midline: Secondary | ICD-10-CM

## 2012-06-07 DIAGNOSIS — N760 Acute vaginitis: Secondary | ICD-10-CM

## 2012-06-07 DIAGNOSIS — R32 Unspecified urinary incontinence: Secondary | ICD-10-CM

## 2012-06-07 DIAGNOSIS — IMO0002 Reserved for concepts with insufficient information to code with codable children: Secondary | ICD-10-CM

## 2012-06-07 DIAGNOSIS — R350 Frequency of micturition: Secondary | ICD-10-CM

## 2012-06-07 LAB — POCT UA - MICROSCOPIC ONLY
Casts, Ur, LPF, POC: NEGATIVE
Crystals, Ur, HPF, POC: NEGATIVE
Yeast, UA: NEGATIVE

## 2012-06-07 LAB — POCT URINALYSIS DIPSTICK
Bilirubin, UA: NEGATIVE
Glucose, UA: NEGATIVE
Ketones, UA: NEGATIVE
Nitrite, UA: NEGATIVE
Spec Grav, UA: 1.025
Urobilinogen, UA: NEGATIVE
pH, UA: 5

## 2012-06-07 LAB — POCT WET PREP WITH KOH
Clue Cells Wet Prep HPF POC: NEGATIVE
Trichomonas, UA: NEGATIVE
Yeast Wet Prep HPF POC: NEGATIVE

## 2012-06-07 MED ORDER — CLOTRIMAZOLE 100 MG VA TABS: 1.0000 | ORAL_TABLET | Freq: Every day | VAGINAL | Status: DC

## 2012-06-07 MED ORDER — CLOTRIMAZOLE 1 % VA CREA: 1.0000 | TOPICAL_CREAM | Freq: Every day | VAGINAL | Status: DC

## 2012-06-07 MED ORDER — SULFAMETHOXAZOLE-TRIMETHOPRIM 800-160 MG PO TABS: 1.0000 | ORAL_TABLET | Freq: Two times a day (BID) | ORAL | Status: DC

## 2012-06-07 NOTE — Progress Notes (Signed)
Patient ID: Katie Bryant, female   DOB: 1928-02-20, 79 y.o.   MRN: [id number] SUBJECTIVE: HPI: Frequency of urination. Wets herself sometimes.vaginal itchiness.and the vulva feels raw.cannot hold her urine before wetting herself. No fever. Has to hurry tofind the bathroom when she goes out and she has to carry her O2 portable tank.and does not have the respiratory reserve to run.  PMH/PSH: reviewed/updated in Epic  SH/FH: reviewed/updated in Epic  Allergies: reviewed/updated in Epic  Medications: reviewed/updated in Epic  Immunizations: reviewed/updated in Epic  ROS: As above in the HPI. All other systems are stable or negative.  OBJECTIVE: APPEARANCE:  Patient in no acute distress.The patient appeared well nourished and normally developed. Acyanotic. Waist: VITAL SIGNS:BP 157/71  Pulse 88  Temp(Src) 98.2 F (36.8 C) (Oral)  Wt 188 lb 6.4 oz (85.458 kg)  BMI 31.35 kg/m2  SpO2 90% Has portable O2 administered via nasal cannula.  SKIN: warm and  Dry without overt rashes, tattoos and scars  HEAD and Neck: without JVD, Head and scalp: normal Eyes:No scleral icterus. Fundi normal, eye movements normal. Ears: Auricle normal, canal normal, Tympanic membranes normal, insufflation normal. Nose: normal Throat: normal Neck & thyroid: normal  CHEST & LUNGS: Chest wall: normal Lungs: coarse breath sounds. No rales no wheezes.  CVS: Reveals the PMI to be normally located. Regular rhythm, First and Second Heart sounds are normal,  absence of murmurs, rubs or gallops. Peripheral vasculature: Radial pulses: normal  ABDOMEN:  Appearance:obese Benign,, no organomegaly, no masses, no Abdominal Aortic enlargement. No Guarding , no rebound. No Bruits. Bowel sounds: normal  RECTAL: N/A GU: PELVIC EXAM:  EXTERNAL Vulva::atorphic with redness and irritation on the inner labia SPECULUM: Vagina:atrophic Cervix:difficult to see due to urethrocystocele. BIMANUAL: CMT:  Negative Adnexae: Normal       No Masses Uterus: not palpable Rectovaginal: Normal Hemoccult: Negative      EXTREMETIES: nonedematous.  MUSCULOSKELETAL:  Spine: normal   NEUROLOGIC: oriented to ,place and person; nonfocal.  ASSESSMENT: Frequency of urination - Plan: POCT UA - Microscopic Only, POCT urinalysis dipstick, Urine culture, Ambulatory referral to Urology, sulfamethoxazole-trimethoprim (BACTRIM DS,SEPTRA DS) 800-160 MG per tablet  Cystocele - Plan: Ambulatory referral to Urology  Urinary incontinence - Plan: Ambulatory referral to Urology  Vaginitis and vulvovaginitis - Plan: POCT Wet Prep with KOH, clotrimazole (GYNE-LOTRIMIN) 1 % vaginal cream, DISCONTINUED: Clotrimazole 100 MG TABS  PLAN: Orders Placed This Encounter  Procedures  . Urine culture  . Ambulatory referral to Urology    Referral Priority:  Routine    Referral Type:  Consultation    Referral Reason:  Specialty Services Required    Requested Specialty:  Urology    Number of Visits Requested:  1  . POCT UA - Microscopic Only  . POCT urinalysis dipstick  . POCT Wet Prep with KOH   Results for orders placed in visit on 06/07/12  POCT UA - MICROSCOPIC ONLY      Result Value Range   WBC, Ur, HPF, POC 10-20     RBC, urine, microscopic 10-15     Bacteria, U Microscopic few     Mucus, UA few     Epithelial cells, urine per micros few     Crystals, Ur, HPF, POC negative     Casts, Ur, LPF, POC negative     Yeast, UA negative    POCT URINALYSIS DIPSTICK      Result Value Range   Color, UA gold     Clarity, UA clear  Glucose, UA negative     Bilirubin, UA negative     Ketones, UA negative     Spec Grav, UA 1.025     Blood, UA moderate     pH, UA 5.0     Protein, UA 4+     Urobilinogen, UA negative     Nitrite, UA negative     Leukocytes, UA moderate (2+)    POCT WET PREP WITH KOH      Result Value Range   Trichomonas, UA Negative     Clue Cells Wet Prep HPF POC negative     Epithelial  Wet Prep HPF POC moderate     Yeast Wet Prep HPF POC negative     Bacteria Wet Prep HPF POC moderate     RBC Wet Prep HPF POC occ     WBC Wet Prep HPF POC 5-8     Meds ordered this encounter  Medications  . sulfamethoxazole-trimethoprim (BACTRIM DS,SEPTRA DS) 800-160 MG per tablet    Sig: Take 1 tablet by mouth 2 (two) times daily.    Dispense:  20 tablet    Refill:  0  . DISCONTD: Clotrimazole 100 MG TABS    Sig: Place 1 tablet (100 mg total) vaginally at bedtime.    Dispense:  14 each    Refill:  0  . clotrimazole (GYNE-LOTRIMIN) 1 % vaginal cream    Sig: Place 1 Applicatorful vaginally at bedtime.    Dispense:  60 g    Refill:  0   Await urine culture.  Await referral assessment.  Return if symptoms worsen or fail to improve, for Recheck medical problems. Yarima Penman P. Modesto Charon, M.D.

## 2012-06-07 NOTE — Telephone Encounter (Signed)
APPT MADE

## 2012-06-09 ENCOUNTER — Ambulatory Visit (INDEPENDENT_AMBULATORY_CARE_PROVIDER_SITE_OTHER): Payer: Medicare Other | Admitting: Internal Medicine

## 2012-06-09 ENCOUNTER — Encounter: Payer: Self-pay | Admitting: Internal Medicine

## 2012-06-09 VITALS — BP 160/74 | HR 94 | Temp 98.4°F | Wt 188.0 lb

## 2012-06-09 DIAGNOSIS — J9610 Chronic respiratory failure, unspecified whether with hypoxia or hypercapnia: Secondary | ICD-10-CM

## 2012-06-09 DIAGNOSIS — J449 Chronic obstructive pulmonary disease, unspecified: Secondary | ICD-10-CM

## 2012-06-09 MED ORDER — METHYLPREDNISOLONE ACETATE 80 MG/ML IJ SUSP
120.0000 mg | Freq: Once | INTRAMUSCULAR | Status: AC
  Administered 2012-06-09: 120 mg via INTRAMUSCULAR

## 2012-06-09 NOTE — Patient Instructions (Addendum)
Try dymista one twice daily to see if helps nose as per med calendar  Change pepcid /famotidine to ranitidine 150 mg twice daily as per med calendar  Ok to leave 02 off at rest but 3lpm at bedtime and up to 4lpm with activity  See Tammy NP in 4  weeks with all your medications, even over the counter meds, separated in two separate bags, the ones you take no matter what vs the ones you stop once you feel better and take only as needed when you feel you need them.   Tammy  will generate for you a new user friendly medication calendar that will put [place] all on the same page re: your medication use.     Without this process, it simply isn't possible to assure that we are providing  your outpatient care  with  the attention to detail we feel you deserve.   If we cannot assure that you're getting that kind of care,  then we cannot manage your problem effectively from this clinic.  Once you have seen Tammy and we are sure that we're all on the same page with your medication use she will arrange follow up with me.

## 2012-06-09 NOTE — Assessment & Plan Note (Addendum)
-   sats 90% RA 03/11/2011     - Dr Maple Hudson started 02 around 2010    -  02 sats at rest 06/09/12 91% but needed 4lpm walking > 50 ft  Changed 02 to No 02 at rest, 3lpm sleep, 4lpm activity as of 06/09/12

## 2012-06-09 NOTE — Progress Notes (Signed)
Subjective:     Patient ID: Katie Bryant, female   DOB: July 10, 1928    MRN: [id number]  HPI 40 yowf quit smoking 1992 with GOLD III copd dx 2009  followed in [facility] p hospitalized at [facility] [place]   Admit date: 12/21/2010  Discharge date: 12/24/2010  C O P D  ALLERGIC RHINITIS  DYSPNEA  G E R D  Aortic stenosis  Insomnia  Leukocytosis        01/21/2011 f/u ov/Katie Bryant cc  baseline doe Katie Bryant off 02 and on 02 can do the whole store s stopping at 3lpm but discharged on 4lpm does not wear it 24 hours 25% of the time. Uses multiple forms of albuterol. Cough is better, sputum mucoid.  >no changes, med rec next step   02/03/11 Follow up and med review Pt returns for follow up and med review.  We reviewed all her meds and organized them into a med calendar with pt education .  She appears to be taking her meds correctly.  She has returned back to her baseline from recent PNA episode last month. CXR c/w complete clearance   rec follow med calendar   03/11/2011 f/u ov/Katie Bryant cc ok x nose bleeds ? On 02 has calendar but not using it, ad libbing on 02 but overall feels making slow progress with return to nl activity tol and really no limiting sob or cough or need for daytime 02 rec Ok to leave 02 off at rest    06/20/2011 f/u ov/Katie Bryant cc 2-3 weeks  increased cough, and sneezing > mucus turned thick yellow rx abx and steroids x 2 some better but very confused with meds/ names/ not following action plans at bottom of calendar.  Sleeping poorly with nocturnal  And  early am exacerbation  of respiratory  c/o's and  need for noct saba. rec Prednisone 10 mg take  4 each am x 2 days,   2 each am x 2 days,  1 each am x2days and stop  Add pepcid 20 mg one at bedtime (Zantac/ranitidine) 150 mg) one at bedtime GERD diet reviewed Flutter valve should help bring up mucus - use it as much as your want Keep Follow up [facility] office Tuesday June 18  Late add: Clarify 02 use for problem list at next ov     06/24/2011 f/u ov/Katie Bryant cc breathing some better but thoroughly confused with instructions/ names of meds/ lost calendar we reviwed just 4 days  prior to OV  - husband came with her today also confused with details of care.  Cough much looser with flutter, producing less thick white mucus only,  not sure which mucinex product she's using, hfa technique quite poor. Did not follow instructions on adding H2  at bedtime "using an acid reducer as needed"  >>budesonide Twice daily  And taper steroids to off   07/09/2011 Follow up and Med review  Returns after recent COPD flare -now resolved and back to her baseline  We reviewed all her meds and organized them into a med calendar w/ pt edcuation  Recent changes were reviewed.  rec Follow med calendar closely and bring to each visit   08/12/2011 f/u ov/[name] cc breathing about the same, minimal cough, thoroughly confused again with instructions and meds, recognizes the calendar we gave her and remembers receiving it but didn't bring it and not using it.   No unusual cough, purulent sputum or HB symptoms on present rx. Does have chronic nasal obst symptoms esp  at hs takes otc doesn't know name.  >>no changes   08/27/2011    Follow up and med calendar  We reviewed all her meds and organized them into a med calendar w/ pt edcuation  She says she got her old med calendar at last ov b/c she was in a hurry Have suggested she throw away old forms if not needed.  She appears to be taking meds correctly Says she is doing well. No flare in dyspnea or cough No fever or chest pain rec No change in rx > follow calendar  09/29/2011 f/u ov/Katie Bryant has med calendar and doing well until 2 weeks prior to OV  with worse nasal congestion and green mucus production. Had been using allegra daily x years and still having lots of watery rhinitis "all year long" refractory to flonase.   Still no increase need for rescue saba daytime rec Augmentin 875 twice daily x 10 days   Prednisone 10 mg take  4 each am x 2 days,   2 each am x 2 days,  1 each am x2days and stop  Start Dymista one twice daily  Please see patient coordinator before you leave today  to schedule nebs refills through [facility] See calendar for specific medication instructions   10/07/2011 f/u ov/Katie Bryant did not bring calendar given just 10 days prior to OV  But says she's following it and breathing and all resp cc's quite a bit better including her watery rhinitis.  Just completing abx, not sure she ever got her prednsone that was called in on previous ov. x - still very confused with names of meds, esp inhalers and nebs. rec Use med calendar No change rx  11/11/2011 f/u ov/Katie Bryant / Katie Bryant with med calendar in hand cc nasal congestion and "nose runs like a faucet" watery esp in ams despite prn allegra.  Still struggling with concept of medication reconciliation but doing some better and using xopenex about twice  Daily, her avg.  rec Chlortrimeton 4 mg at bedtime automatically and again  every 6 hours as needed for stuffy nose, runny nose (instead of allegra)  Take the budesonide along with your morning treatments No obvious daytime variabilty or assoc chronic cough or cp or chest tightness, subjective wheeze overt sinus or hb symptoms. No unusual exp hx   01/13/2012 f/u ov/Katie Bryant cc still confused with meds, not following prev instructions re 1st gen H1 re nasal symptoms has calendar but not using it. Variably sob room to room.  No purulent sputum overt sinus or hb symptoms. Some better with saba.   >>pred /chlor tabs   02/12/2012  Follow up and Med review     Patient returns for a one-month followup and medication review. Last visit. Patient with a COPD exacerbation, and acute rhinitis. Patient was treated with a steroid taper and advised adding chlor tabs. Patient returns today with some improvement with decreased cough, and drainage. We reviewed all her medications and organized them into a medication  calendar with patient education. It does appear that she is taking her medications correctly. However, she does have some difficulty with pronunciation of the medications. She realizes quite a bit on how the medication works and what the bottle says rec No change rx, use med calendar   06/09/2012 f/u ov/Syniah Berne re copd/02 dep  Chief Complaint  Patient presents with  . Follow-up    Pt c/o increased SOB for the past 2 days-she relates this to having nasal congestion and not getting enough o2  through her nasal cannula. Her sats when she arrived were 66%3lpm pulsed o2---increased to 91%4lpm continuous o2.    thoroughly confused with meds despite calendar, has several written as generics not matching her med calendar. meds in wrong bag (maint vs prns even though each bag is clearly marked with daily vs as neededs.  No obvious daytime variabilty or assoc chronic cough or cp or chest tightness, subjective wheeze overt sinus or hb symptoms. No unusual exp hx or h/o childhood pna/ asthma or premature birth to his knowledge.   Sleeping ok without nocturnal  or early am exacerbation  of respiratory  c/o's or need for noct saba. Also denies any obvious fluctuation of symptoms with weather or environmental changes or other aggravating or alleviating factors except as outlined above  Current Medications, Allergies, Past Medical History, Past Surgical History, Family History, and Social History were reviewed in [employer] record.  ROS  The following are not active complaints unless bolded sore throat, dysphagia, dental problems, itching, sneezing,  nasal congestion or excess/ purulent secretions, ear ache,   fever, chills, sweats, unintended wt loss, pleuritic or exertional cp, hemoptysis,  orthopnea pnd or leg swelling, presyncope, palpitations, heartburn, abdominal pain, anorexia, nausea, vomiting, diarrhea  or change in bowel or urinary habits, change in stools or urine, dysuria,hematuria,   rash, arthralgias, visual complaints, headache, numbness weakness or ataxia or problems with walking or coordination,  change in mood/affect or memory.                     Objective:   Physical Exam anxioud  amb wf nad Wt 184 01/21/2011  >08/27/2011 186 > 180 09/29/2011 > 10/07/2011  183 > 11/11/2011  181 > 01/13/2012  183 >180 02/12/2012 > 06/09/2012 188  HEENT mod non-specific turbinate edema.  Oropharynx no thrush or excess pnd or cobblestoning.  No JVD or cervical adenopathy. Mild accessory muscle hypertrophy. Trachea midline, nl thryroid. Chest was hyperinflated by percussion with diminished breath sounds Regular rate and rhythm without murmur gallop or rub or increase P2 or edema.  Abd: no hsm, nl excursion. Ext warm without cyanosis or clubbing.       CXR 02/03/11  Interval resolution of the previously seen infiltrates/edema  with stable chronic changes.    Assessment:         Plan:

## 2012-06-10 NOTE — Assessment & Plan Note (Signed)
-   PFT's 06/11/2007  FEV1   0.70 (34%) ratio 32 with 49% improvement p B2    - HFA 75% effective p coaching 06/20/2011 > 50% 06/24/2011   DDX of  difficult airways managment all start with A and  include Adherence, Ace Inhibitors, Acid Reflux, Active Sinus Disease, Alpha 1 Antitripsin deficiency, Anxiety masquerading as Airways dz,  ABPA,  allergy(esp in young), Aspiration (esp in elderly), Adverse effects of DPI,  Active smokers, plus two Bs  = Bronchiectasis and Beta blocker use..and one C= CHF   Adherence is always the initial "prime suspect" and is a multilayered concern that requires a "trust but verify" approach in every patient - starting with knowing how to use medications, especially inhalers, correctly, keeping up with refills and understanding the fundamental difference between maintenance and prns vs those medications only taken for a very short course and then stopped and not refilled. The proper method of use, as well as anticipated side effects, of a metered-dose inhaler are discussed and demonstrated to the patient. Improved effectiveness after extensive coaching during this visit to a level of approximately  75% p coaching.  ? Acid reflux > mas rx  ? Active sinus/rhinitis > try dymista one bid then sinus ct if nasal obs symptoms not improving

## 2012-06-14 ENCOUNTER — Other Ambulatory Visit: Payer: Self-pay | Admitting: Internal Medicine

## 2012-06-14 ENCOUNTER — Telehealth: Payer: Self-pay | Admitting: Family Medicine

## 2012-06-14 MED ORDER — ALPRAZOLAM 1 MG PO TABS: ORAL_TABLET | ORAL | Status: DC

## 2012-06-14 NOTE — Telephone Encounter (Signed)
D/c bactrim, use probiotics OTC. Urinr for UA and UCx Asap,OV if not bertter.

## 2012-06-14 NOTE — Telephone Encounter (Signed)
Pt advised.

## 2012-06-15 ENCOUNTER — Other Ambulatory Visit (INDEPENDENT_AMBULATORY_CARE_PROVIDER_SITE_OTHER): Payer: Medicare Other

## 2012-06-15 DIAGNOSIS — N390 Urinary tract infection, site not specified: Secondary | ICD-10-CM

## 2012-06-15 NOTE — Progress Notes (Signed)
Patient came in for labs only.

## 2012-06-16 LAB — URINE CULTURE
Colony Count: NO GROWTH
Organism ID, Bacteria: NO GROWTH

## 2012-06-18 NOTE — Progress Notes (Signed)
Quick Note:  Call patient. Labs normal.no urine infection. No change in plan.will need to see the urologist as planned. ______

## 2012-06-19 ENCOUNTER — Other Ambulatory Visit: Payer: Self-pay | Admitting: Cardiology

## 2012-06-20 ENCOUNTER — Other Ambulatory Visit: Payer: Self-pay | Admitting: Internal Medicine

## 2012-07-02 ENCOUNTER — Telehealth: Payer: Self-pay | Admitting: Internal Medicine

## 2012-07-02 ENCOUNTER — Telehealth: Payer: Self-pay | Admitting: Family Medicine

## 2012-07-02 NOTE — Telephone Encounter (Signed)
Spoke with the pt and notified of recs per RB She verbalized understanding and states no further questions

## 2012-07-02 NOTE — Telephone Encounter (Signed)
PATIENT AWARE AND WILL CHECK WITH OUR OF PHARMACY AND LET us KNOW IF IT WILL BE CHEAPER

## 2012-07-02 NOTE — Telephone Encounter (Signed)
She can take [facility] short term as directed for pain, although in some people this exacerbates allergies or asthma so she should pay attention to he sx. The equate 180mg  is fine. She might want to also consider decongestant containing chlorpheniramine or brompheniramine in addition to equate, taken as directed.

## 2012-07-02 NOTE — Telephone Encounter (Signed)
I spoke with pt. She stated the robitussin is causing her head to "roar". She wants to stop this and take otc equate allergy relief 180 mg. She stated she knows her nose is stopped up d/t allergies. She is allergic to pines and per pt she has this all around her house. She stated she also needs the ok from the doctor to take [facility] instead of tylenol for the HA. Since MW is scheduled off will forward to doc of the day for recs. Please advise thanks  Allergies  Allergen Reactions  . Other     STEROIDS, pt staes she blows up and gains weight

## 2012-07-02 NOTE — Telephone Encounter (Signed)
I do not know- ask pharmacy and if yes i will change rx

## 2012-07-07 ENCOUNTER — Encounter: Payer: Medicare Other | Admitting: Adult Health

## 2012-07-14 ENCOUNTER — Ambulatory Visit (INDEPENDENT_AMBULATORY_CARE_PROVIDER_SITE_OTHER): Payer: Medicare Other | Admitting: Adult Health

## 2012-07-14 ENCOUNTER — Encounter: Payer: Self-pay | Admitting: Adult Health

## 2012-07-14 VITALS — BP 146/70 | HR 79 | Temp 98.1°F

## 2012-07-14 DIAGNOSIS — J449 Chronic obstructive pulmonary disease, unspecified: Secondary | ICD-10-CM

## 2012-07-14 DIAGNOSIS — J310 Chronic rhinitis: Secondary | ICD-10-CM

## 2012-07-14 NOTE — Assessment & Plan Note (Signed)
Improved control  Cont on current regimen  

## 2012-07-14 NOTE — Patient Instructions (Addendum)
Continue on current regimen  follow up Dr. Sherene Sires  In 3 months and As needed

## 2012-07-14 NOTE — Assessment & Plan Note (Signed)
Compensated on present regimen.  follow up Dr. Sherene Sires  In 3 months

## 2012-07-14 NOTE — Progress Notes (Signed)
Subjective:     Patient ID: Katie Bryant, female   DOB: 19-Dec-1928    MRN: [id number]  HPI 48 yowf quit smoking 1992 with GOLD III copd dx 2009  followed in [facility] p hospitalized at [facility]   Admit date: 12/21/2010  Discharge date: 12/24/2010  C O P D  ALLERGIC RHINITIS  DYSPNEA  G E R D  Aortic stenosis  Insomnia  Leukocytosis        01/21/2011 f/u ov/Wert cc  baseline doe [name] off 02 and on 02 can do the whole store s stopping at 3lpm but discharged on 4lpm does not wear it 24 hours 25% of the time. Uses multiple forms of albuterol. Cough is better, sputum mucoid.  >no changes, med rec next step   02/03/11 Follow up and med review Pt returns for follow up and med review.  We reviewed all her meds and organized them into a med calendar with pt education .  She appears to be taking her meds correctly.  She has returned back to her baseline from recent PNA episode last month. CXR c/w complete clearance   rec follow med calendar   03/11/2011 f/u ov/Wert cc ok x nose bleeds ? On 02 has calendar but not using it, ad libbing on 02 but overall feels making slow progress with return to nl activity tol and really no limiting sob or cough or need for daytime 02 rec Ok to leave 02 off at rest    06/20/2011 f/u ov/Wert cc 2-3 weeks  increased cough, and sneezing > mucus turned thick yellow rx abx and steroids x 2 some better but very confused with meds/ names/ not following action plans at bottom of calendar.  Sleeping poorly with nocturnal  And  early am exacerbation  of respiratory  c/o's and  need for noct saba. rec Prednisone 10 mg take  4 each am x 2 days,   2 each am x 2 days,  1 each am x2days and stop  Add pepcid 20 mg one at bedtime (Zantac/ranitidine) 150 mg) one at bedtime GERD diet reviewed Flutter valve should help bring up mucus - use it as much as your want Keep Follow up [facility] office Tuesday June 18  Late add: Clarify 02 use for problem list at next ov     06/24/2011 f/u ov/Wert cc breathing some better but thoroughly confused with instructions/ names of meds/ lost calendar we reviwed just 4 days  prior to OV  - husband came with her today also confused with details of care.  Cough much looser with flutter, producing less thick white mucus only,  not sure which mucinex product she's using, hfa technique quite poor. Did not follow instructions on adding H2  at bedtime "using an acid reducer as needed"  >>budesonide Twice daily  And taper steroids to off   07/09/2011 Follow up and Med review  Returns after recent COPD flare -now resolved and back to her baseline  We reviewed all her meds and organized them into a med calendar w/ pt edcuation  Recent changes were reviewed.  rec Follow med calendar closely and bring to each visit   08/12/2011 f/u ov/Wert cc breathing about the same, minimal cough, thoroughly confused again with instructions and meds, recognizes the calendar we gave her and remembers receiving it but didn't bring it and not using it.   No unusual cough, purulent sputum or HB symptoms on present rx. Does have chronic nasal obst symptoms esp  at hs takes otc doesn't know name.  >>no changes   08/27/2011    Follow up and med calendar  We reviewed all her meds and organized them into a med calendar w/ pt edcuation  She says she got her old med calendar at last ov b/c she was in a hurry Have suggested she throw away old forms if not needed.  She appears to be taking meds correctly Says she is doing well. No flare in dyspnea or cough No fever or chest pain rec No change in rx > follow calendar  09/29/2011 f/u ov/Wert has med calendar and doing well until 2 weeks prior to OV  with worse nasal congestion and green mucus production. Had been using allegra daily x years and still having lots of watery rhinitis "all year long" refractory to flonase.   Still no increase need for rescue saba daytime rec Augmentin 875 twice daily x 10 days   Prednisone 10 mg take  4 each am x 2 days,   2 each am x 2 days,  1 each am x2days and stop  Start Dymista one twice daily  Please see patient coordinator before you leave today  to schedule nebs refills through [facility] See calendar for specific medication instructions   10/07/2011 f/u ov/Wert did not bring calendar given just 10 days prior to OV  But says she's following it and breathing and all resp cc's quite a bit better including her watery rhinitis.  Just completing abx, not sure she ever got her prednsone that was called in on previous ov. x - still very confused with names of meds, esp inhalers and nebs. rec Use med calendar No change rx  11/11/2011 f/u ov/Wert / [name] with med calendar in hand cc nasal congestion and "nose runs like a faucet" watery esp in ams despite prn allegra.  Still struggling with concept of medication reconciliation but doing some better and using xopenex about twice  Daily, her avg.  rec Chlortrimeton 4 mg at bedtime automatically and again  every 6 hours as needed for stuffy nose, runny nose (instead of allegra)  Take the budesonide along with your morning treatments No obvious daytime variabilty or assoc chronic cough or cp or chest tightness, subjective wheeze overt sinus or hb symptoms. No unusual exp hx   01/13/2012 f/u ov/Wert cc still confused with meds, not following prev instructions re 1st gen H1 re nasal symptoms has calendar but not using it. Variably sob room to room.  No purulent sputum overt sinus or hb symptoms. Some better with saba.   >>pred /chlor tabs   02/12/2012  Follow up and Med review     Patient returns for a one-month followup and medication review. Last visit. Patient with a COPD exacerbation, and acute rhinitis. Patient was treated with a steroid taper and advised adding chlor tabs. Patient returns today with some improvement with decreased cough, and drainage. We reviewed all her medications and organized them into a medication  calendar with patient education. It does appear that she is taking her medications correctly. However, she does have some difficulty with pronunciation of the medications. She realizes quite a bit on how the medication works and what the bottle says rec No change rx, use med calendar   06/09/2012 f/u ov/Wert re copd/02 dep  Chief Complaint  Patient presents with  . Follow-up    Pt c/o increased SOB for the past 2 days-she relates this to having nasal congestion and not getting enough o2  through her nasal cannula. Her sats when she arrived were 66%3lpm pulsed o2---increased to 91%4lpm continuous o2.    thoroughly confused with meds despite calendar, has several written as generics not matching her med calendar. meds in wrong bag (maint vs prns even though each bag is clearly marked with daily vs as neededs. >rx dymista , changed pepcid to zantac 150mg  Twice daily   Ok to leave 02 off at rest but 3lpm at bedtime and up to 4lpm with activity   07/14/2012 Follow up  Returns for follow up and med review ., pt did not bring her meds today.   Tearful today , reports having some issues at home-husband and her not getting a long.  Feels  breathing is doing "real good" and swimming yesterday. No flare in cough or dyspnea.  No wheezing, hemoptysis, fever or edema.  Nasal drainage better with dymista     Current Medications, Allergies, Past Medical History, Past Surgical History, Family History, and Social History were reviewed in [employer] record.  ROS  The following are not active complaints unless bolded sore throat, dysphagia, dental problems, itching, sneezing,  nasal congestion or excess/ purulent secretions, ear ache,   fever, chills, sweats, unintended wt loss, pleuritic or exertional cp, hemoptysis,  orthopnea pnd or leg swelling, presyncope, palpitations, heartburn, abdominal pain, anorexia, nausea, vomiting, diarrhea  or change in bowel or urinary habits, change in  stools or urine, dysuria,hematuria,  rash, arthralgias, visual complaints, headache, numbness weakness or ataxia or problems with walking or coordination,  change in mood/affect or memory.                     Objective:   Physical Exam anxioud  amb wf nad Wt 184 01/21/2011  >08/27/2011 186 > 180 09/29/2011 > 10/07/2011  183 > 11/11/2011  181 > 01/13/2012  183 >180 02/12/2012 > 06/09/2012 188  HEENT mod non-specific turbinate edema.  Oropharynx no thrush or excess pnd or cobblestoning.  No JVD or cervical adenopathy. Mild accessory muscle hypertrophy. Trachea midline, nl thryroid. Chest was hyperinflated by percussion with diminished breath sounds Regular rate and rhythm without murmur gallop or rub or increase P2 or edema.  Abd: no hsm, nl excursion. Ext warm without cyanosis or clubbing.       CXR 02/03/11  Interval resolution of the previously seen infiltrates/edema  with stable chronic changes.    Assessment:         Plan:

## 2012-07-16 ENCOUNTER — Ambulatory Visit: Payer: Medicare Other | Admitting: Family Medicine

## 2012-07-28 ENCOUNTER — Encounter: Payer: Self-pay | Admitting: Cardiology

## 2012-07-28 ENCOUNTER — Ambulatory Visit (INDEPENDENT_AMBULATORY_CARE_PROVIDER_SITE_OTHER): Payer: Medicare Other | Admitting: Cardiology

## 2012-07-28 VITALS — BP 139/67 | HR 90 | Ht 66.0 in | Wt 190.0 lb

## 2012-07-28 DIAGNOSIS — I359 Nonrheumatic aortic valve disorder, unspecified: Secondary | ICD-10-CM

## 2012-07-28 DIAGNOSIS — I350 Nonrheumatic aortic (valve) stenosis: Secondary | ICD-10-CM

## 2012-07-28 NOTE — Patient Instructions (Addendum)
The current medical regimen is effective;  continue present plan and medications.  Follow up in 6 months with Dr Hochrein.  You will receive a letter in the mail 2 months before you are due.  Please call us when you receive this letter to schedule your follow up appointment.  

## 2012-07-28 NOTE — Progress Notes (Signed)
HPI The patient presents for followup of aortic stenosis.  Since I last saw her she has done well.  The patient denies any new symptoms such as chest discomfort, neck or arm discomfort. There has been PND or orthopnea. There have been no reported palpitations, presyncope or syncope.  She has chronic lung disease and is O2 dependent.  She has significant limiting dyspnea from this.  She did tell me a long story about having an episode of blindness several years ago that was transient and some vague symptoms off and on since then. For further evaluation of this she recently had a carotid Doppler which demonstrated some moderate stenosis and a noncontrasted head CT that was normal.   Allergies  Allergen Reactions  . Other     STEROIDS, pt staes she blows up and gains weight    Current Outpatient Prescriptions  Medication Sig Dispense Refill  . albuterol (PROVENTIL HFA;VENTOLIN HFA) 108 (90 BASE) MCG/ACT inhaler 2 puffs every 4-6 hours as needed      . ALPRAZolam (XANAX) 1 MG tablet TAKE ONE TABLET BY MOUTH TWICE DAILY AS NEEDED  60 tablet  1  . aspirin 81 MG tablet Take 81 mg by mouth daily.      Marland Kitchen atorvastatin (LIPITOR) 40 MG tablet TAKE ONE TABLET BY MOUTH EVERY DAY  30 tablet  4  . Azelastine-Fluticasone (DYMISTA) 137-50 MCG/ACT SUSP Place into the nose.      . budesonide (PULMICORT) 0.5 MG/2ML nebulizer solution Take 2 mLs (0.5 mg total) by nebulization 2 (two) times daily.  120 mL  11  . chlorpheniramine (CHLOR-TRIMETON) 4 MG tablet 1 tab by mouth daily at bedtime.  May add 1 every 6 hours as needed for drippy nose.      . clotrimazole (GYNE-LOTRIMIN) 1 % vaginal cream Place 1 Applicatorful vaginally at bedtime.  60 g  0  . famotidine (PEPCID) 20 MG tablet Take 1 tablet (20 mg total) by mouth at bedtime as needed (for reflux).      . fenofibrate micronized (LOFIBRA) 134 MG capsule TAKE ONE CAPSULE BY MOUTH EVERY DAY BEFORE BREAKFAST  30 capsule  1  . guaiFENesin (MUCINEX) 600 MG 12 hr  tablet 1-2 in AM as needed      . ipratropium-albuterol (DUONEB) 0.5-2.5 (3) MG/3ML SOLN Take 3 mLs by nebulization 4 (four) times daily.  360 mL  11  . metoprolol tartrate (LOPRESSOR) 25 MG tablet Take 0.5 tablets (12.5 mg total) by mouth 2 (two) times daily.  90 tablet  4  . OXYGEN-HELIUM IN Wear 3 lpm continuously and increase to to 4 lpm as needed for shortness of breath w/ activity      . Respiratory Therapy Supplies (FLUTTER) DEVI Use as directed  1 each  0  . valsartan-hydrochlorothiazide (DIOVAN HCT) 80-12.5 MG per tablet Take 1 tablet by mouth daily.  90 tablet  2  . VENTOLIN HFA 108 (90 BASE) MCG/ACT inhaler INHALE TWO PUFFS INTO LUNGS EVERY 6 HOURS AS NEEDED  18 each  2  . sodium chloride (OCEAN NASAL SPRAY) 0.65 % nasal spray 2 puffs every 4-6 hours as needed for nasal congestion       No current facility-administered medications for this visit.    Past Medical History  Diagnosis Date  . COPD (chronic obstructive pulmonary disease)   . Oxygen dependent   . Aortic stenosis, mild   . Obesity   . Hypertension   . Anxiety   . Back pain   .  Headache(784.0)   . Breast cancer   . Asthma   . Sleep apnea   . DEMENTIA   . GERD (gastroesophageal reflux disease)   . Arthritis   . Pneumonia   . H/O hiatal hernia   . Depression     Past Surgical History  Procedure Laterality Date  . Cardiac catheterization      EF 55-60%  . Tonsillectomy and adenoidectomy  1942  . Breast reconstruction    . Appendectomy    . Mastectomy  1980    ROS:  As stated in the HPI and negative for all other systems.  PHYSICAL EXAM BP 139/67  Pulse 90  Ht 5\' 6"  (1.676 m)  Wt 190 lb (86.183 kg)  BMI 30.68 kg/m2 GENERAL:  Well appearing NECK:  No jugular venous distention, waveform within normal limits, carotid upstroke brisk and symmetric, no bruits, no thyromegaly LUNGS:  Decreased breath sounds with no wheezing or crackles BACK:  No CVA tenderness HEART:  PMI not displaced or sustained,S1  and S2 within normal limits, no S3, no S4, no clicks, no rubs, 2/6 systolic murmur her best at the left upper sternal border,  Very diminished heart sounds ABD:  Flat, positive bowel sounds normal in frequency in pitch, no bruits, no rebound, no guarding, no midline pulsatile mass, no hepatomegaly, no splenomegaly EXT:  2 plus pulses throughout, no edema, no cyanosis no clubbing  EKG:  Normal sinus rhythm, rate 90, left axis deviation, no acute ST-T wave changes. 07/28/2012  ASSESSMENT AND PLAN  Aortic stenosis - She had very mild AI.  No further workup is suggested. No change in therapy is indicated.  Of note she has mild septal hypertrophy.  HTN (hypertension) -  Her blood pressure is well controlled and she will continue the meds as listed.  Carotid stenosis - This was noted recently. We will schedule followup of this in about 6 months.

## 2012-08-06 ENCOUNTER — Telehealth: Payer: Self-pay | Admitting: Family Medicine

## 2012-08-06 ENCOUNTER — Other Ambulatory Visit: Payer: Self-pay | Admitting: Internal Medicine

## 2012-08-06 NOTE — Telephone Encounter (Signed)
spoke with pt -sample not needed she was wanting refill on albuterol which has been sent to [facility] pt aware

## 2012-08-13 ENCOUNTER — Other Ambulatory Visit: Payer: Self-pay | Admitting: Cardiology

## 2012-08-25 ENCOUNTER — Other Ambulatory Visit: Payer: Self-pay

## 2012-09-03 ENCOUNTER — Other Ambulatory Visit: Payer: Self-pay | Admitting: Internal Medicine

## 2012-09-07 ENCOUNTER — Telehealth: Payer: Self-pay | Admitting: Internal Medicine

## 2012-09-07 MED ORDER — ALPRAZOLAM 1 MG PO TABS: ORAL_TABLET | ORAL | Status: DC

## 2012-09-07 NOTE — Telephone Encounter (Signed)
Please advise if okay to refill thanks 

## 2012-09-07 NOTE — Telephone Encounter (Signed)
Dr. Sherene Sires attempted to call this in 09/03/12. I have called this in today. Pt aware and nothing further needed

## 2012-10-02 ENCOUNTER — Other Ambulatory Visit: Payer: Self-pay | Admitting: Internal Medicine

## 2012-10-05 NOTE — Telephone Encounter (Signed)
Alprazolam last refilled 09/07/12 #60 x 0 refills Last OV 06/09/12 Pending 10/12/12 Please advise MW thanks

## 2012-10-07 ENCOUNTER — Encounter: Payer: Self-pay | Admitting: Family Medicine

## 2012-10-07 ENCOUNTER — Ambulatory Visit (INDEPENDENT_AMBULATORY_CARE_PROVIDER_SITE_OTHER): Payer: Medicare Other

## 2012-10-07 ENCOUNTER — Ambulatory Visit (INDEPENDENT_AMBULATORY_CARE_PROVIDER_SITE_OTHER): Payer: Medicare Other | Admitting: Family Medicine

## 2012-10-07 ENCOUNTER — Telehealth: Payer: Self-pay | Admitting: Internal Medicine

## 2012-10-07 VITALS — BP 139/64 | HR 84 | Temp 98.1°F | Ht 66.0 in | Wt 188.6 lb

## 2012-10-07 DIAGNOSIS — J449 Chronic obstructive pulmonary disease, unspecified: Secondary | ICD-10-CM

## 2012-10-07 DIAGNOSIS — J4489 Other specified chronic obstructive pulmonary disease: Secondary | ICD-10-CM

## 2012-10-07 DIAGNOSIS — R591 Generalized enlarged lymph nodes: Secondary | ICD-10-CM

## 2012-10-07 DIAGNOSIS — R599 Enlarged lymph nodes, unspecified: Secondary | ICD-10-CM

## 2012-10-07 DIAGNOSIS — I10 Essential (primary) hypertension: Secondary | ICD-10-CM

## 2012-10-07 DIAGNOSIS — I350 Nonrheumatic aortic (valve) stenosis: Secondary | ICD-10-CM

## 2012-10-07 DIAGNOSIS — J9610 Chronic respiratory failure, unspecified whether with hypoxia or hypercapnia: Secondary | ICD-10-CM

## 2012-10-07 DIAGNOSIS — D72829 Elevated white blood cell count, unspecified: Secondary | ICD-10-CM

## 2012-10-07 DIAGNOSIS — E669 Obesity, unspecified: Secondary | ICD-10-CM

## 2012-10-07 DIAGNOSIS — E785 Hyperlipidemia, unspecified: Secondary | ICD-10-CM

## 2012-10-07 DIAGNOSIS — I359 Nonrheumatic aortic valve disorder, unspecified: Secondary | ICD-10-CM

## 2012-10-07 HISTORY — DX: Generalized enlarged lymph nodes: R59.1

## 2012-10-07 LAB — POCT CBC
Granulocyte percent: 72.3 %G (ref 37–80)
HCT, POC: 36.7 % — AB (ref 37.7–47.9)
Hemoglobin: 12.1 g/dL — AB (ref 12.2–16.2)
Lymph, poc: 2 (ref 0.6–3.4)
MCH, POC: 29.5 pg (ref 27–31.2)
MCHC: 32.8 g/dL (ref 31.8–35.4)
MCV: 89.8 fL (ref 80–97)
MPV: 6.5 fL (ref 0–99.8)
POC Granulocyte: 7 — AB (ref 2–6.9)
POC LYMPH PERCENT: 20.9 %L (ref 10–50)
Platelet Count, POC: 260 10*3/uL (ref 142–424)
RBC: 4.1 M/uL (ref 4.04–5.48)
RDW, POC: 13 %
WBC: 9.7 10*3/uL (ref 4.6–10.2)

## 2012-10-07 MED ORDER — BUDESONIDE 0.5 MG/2ML IN SUSP: 0.5000 mg | Freq: Two times a day (BID) | RESPIRATORY_TRACT | Status: DC

## 2012-10-07 MED ORDER — IPRATROPIUM-ALBUTEROL 0.5-2.5 (3) MG/3ML IN SOLN: 3.0000 mL | Freq: Four times a day (QID) | RESPIRATORY_TRACT | Status: DC

## 2012-10-07 NOTE — Progress Notes (Signed)
Patient ID: Katie Bryant, female   DOB: 26-Aug-1928, 79 y.o.   MRN: [id number] SUBJECTIVE: CC: Chief Complaint  Patient presents with  . Acute Visit    knot behind rt ear    HPI: There is a knot behind the right mastoid area. This came up 2 months ago when she had an infection of the right leg where she had a biopsy for skin cancer. It is better and dry now and so is the lump. Came to get checked.  Past Medical History  Diagnosis Date  . COPD (chronic obstructive pulmonary disease)   . Oxygen dependent   . Aortic stenosis, mild   . Obesity   . Hypertension   . Anxiety   . Back pain   . Headache(784.0)   . Breast cancer   . Asthma   . Sleep apnea   . DEMENTIA   . GERD (gastroesophageal reflux disease)   . Arthritis   . Pneumonia   . H/O hiatal hernia   . Depression    Past Surgical History  Procedure Laterality Date  . Cardiac catheterization      EF 55-60%  . Tonsillectomy and adenoidectomy  1942  . Breast reconstruction    . Appendectomy    . Mastectomy  1980   History   Social History  . Marital Status: Married    Spouse Name: N/A    Number of Children: N/A  . Years of Education: N/A   Occupational History  . Not on file.   Social History Main Topics  . Smoking status: Former Smoker -- 1.00 packs/day for 40 years    Types: Cigarettes    Quit date: 02/11/1990  . Smokeless tobacco: Never Used  . Alcohol Use: 4.2 oz/week    7 Glasses of wine per week  . Drug Use: No  . Sexual Activity: Not Currently   Other Topics Concern  . Not on file   Social History Narrative  . No narrative on file   Family History  Problem Relation Age of Onset  . Tuberculosis Mother   . Lung cancer Father    Current Outpatient Prescriptions on File Prior to Visit  Medication Sig Dispense Refill  . albuterol (PROVENTIL HFA;VENTOLIN HFA) 108 (90 BASE) MCG/ACT inhaler 2 puffs every 4-6 hours as needed      . ALPRAZolam (XANAX) 1 MG tablet TAKE ONE TABLET BY MOUTH TWICE  DAILY AS NEEDED  60 tablet  0  . aspirin 81 MG tablet Take 81 mg by mouth daily.      Marland Kitchen atorvastatin (LIPITOR) 40 MG tablet TAKE ONE TABLET BY MOUTH EVERY DAY  30 tablet  4  . Azelastine-Fluticasone (DYMISTA) 137-50 MCG/ACT SUSP Place into the nose.      . budesonide (PULMICORT) 0.5 MG/2ML nebulizer solution Take 2 mLs (0.5 mg total) by nebulization 2 (two) times daily.  120 mL  11  . clotrimazole (GYNE-LOTRIMIN) 1 % vaginal cream Place 1 Applicatorful vaginally at bedtime.  60 g  0  . famotidine (PEPCID) 20 MG tablet Take 1 tablet (20 mg total) by mouth at bedtime as needed (for reflux).      . fenofibrate micronized (LOFIBRA) 134 MG capsule TAKE ONE CAPSULE BY MOUTH ONCE DAILY BEFORE BREAKFAST  30 capsule  6  . guaiFENesin (MUCINEX) 600 MG 12 hr tablet 1-2 in AM as needed      . ipratropium-albuterol (DUONEB) 0.5-2.5 (3) MG/3ML SOLN Take 3 mLs by nebulization 4 (four) times daily.  360 mL  11  . metoprolol tartrate (LOPRESSOR) 25 MG tablet Take 0.5 tablets (12.5 mg total) by mouth 2 (two) times daily.  90 tablet  4  . OXYGEN-HELIUM IN Wear 3 lpm continuously and increase to to 4 lpm as needed for shortness of breath w/ activity      . Respiratory Therapy Supplies (FLUTTER) DEVI Use as directed  1 each  0  . sodium chloride (OCEAN NASAL SPRAY) 0.65 % nasal spray 2 puffs every 4-6 hours as needed for nasal congestion      . valsartan-hydrochlorothiazide (DIOVAN HCT) 80-12.5 MG per tablet Take 1 tablet by mouth daily.  90 tablet  2  . VENTOLIN HFA 108 (90 BASE) MCG/ACT inhaler INHALE TWO PUFFS EVERY 6 HOURS AS NEEDED  18 each  2   No current facility-administered medications on file prior to visit.   Allergies  Allergen Reactions  . Other     STEROIDS, pt staes she blows up and gains weight   Immunization History  Administered Date(s) Administered  . Influenza Split 10/07/2010, 09/19/2012  . Influenza Whole 10/07/2011  . Pneumococcal Polysaccharide 01/06/2009   Prior to Admission  medications   Medication Sig Start Date End Date Taking? Authorizing Provider  albuterol (PROVENTIL HFA;VENTOLIN HFA) 108 (90 BASE) MCG/ACT inhaler 2 puffs every 4-6 hours as needed 06/20/11  Yes Nyoka Cowden, MD  ALPRAZolam Prudy Feeler) 1 MG tablet TAKE ONE TABLET BY MOUTH TWICE DAILY AS NEEDED 10/02/12  Yes Nyoka Cowden, MD  aspirin 81 MG tablet Take 81 mg by mouth daily.   Yes Historical Provider, MD  atorvastatin (LIPITOR) 40 MG tablet TAKE ONE TABLET BY MOUTH EVERY DAY 04/23/12  Yes Ileana Ladd, MD  Azelastine-Fluticasone [facility]) 137-50 MCG/ACT SUSP Place into the nose.   Yes Historical Provider, MD  budesonide (PULMICORT) 0.5 MG/2ML nebulizer solution Take 2 mLs (0.5 mg total) by nebulization 2 (two) times daily. 10/07/12  Yes Nyoka Cowden, MD  clotrimazole (GYNE-LOTRIMIN) 1 % vaginal cream Place 1 Applicatorful vaginally at bedtime. 06/07/12  Yes Ileana Ladd, MD  famotidine (PEPCID) 20 MG tablet Take 1 tablet (20 mg total) by mouth at bedtime as needed (for reflux). 02/12/11 07/14/13 Yes Tammy S Parrett, NP  fenofibrate micronized (LOFIBRA) 134 MG capsule TAKE ONE CAPSULE BY MOUTH ONCE DAILY BEFORE BREAKFAST 08/13/12  Yes Rollene Rotunda, MD  guaiFENesin (MUCINEX) 600 MG 12 hr tablet 1-2 in AM as needed   Yes Historical Provider, MD  ipratropium-albuterol (DUONEB) 0.5-2.5 (3) MG/3ML SOLN Take 3 mLs by nebulization 4 (four) times daily. 10/07/12  Yes Nyoka Cowden, MD  metoprolol tartrate (LOPRESSOR) 25 MG tablet Take 0.5 tablets (12.5 mg total) by mouth 2 (two) times daily. 02/27/12  Yes Vesta Mixer, MD  OXYGEN-HELIUM IN Wear 3 lpm continuously and increase to to 4 lpm as needed for shortness of breath w/ activity   Yes Historical Provider, MD  ranitidine (ZANTAC) 150 MG tablet Take 150 mg by mouth 2 (two) times daily.   Yes Historical Provider, MD  Respiratory Therapy Supplies (FLUTTER) DEVI Use as directed 06/20/11  Yes Nyoka Cowden, MD  sodium chloride (OCEAN NASAL SPRAY) 0.65 % nasal  spray 2 puffs every 4-6 hours as needed for nasal congestion 02/12/11  Yes Tammy S Parrett, NP  valsartan-hydrochlorothiazide (DIOVAN HCT) 80-12.5 MG per tablet Take 1 tablet by mouth daily. 03/03/11  Yes Rollene Rotunda, MD  VENTOLIN HFA 108 (90 BASE) MCG/ACT inhaler INHALE TWO PUFFS EVERY 6 HOURS AS NEEDED 08/06/12  Yes Nyoka Cowden, MD     ROS: As above in the HPI. All other systems are stable or negative.  OBJECTIVE: APPEARANCE:  Patient in no acute distress.The patient appeared well nourished and normally developed. Acyanotic. Waist: VITAL SIGNS:BP 139/64  Pulse 84  Temp(Src) 98.1 F (36.7 C) (Oral)  Ht 5\' 6"  (1.676 m)  Wt 188 lb 9.6 oz (85.548 kg)  BMI 30.46 kg/m2  SpO2 94% WF  Obesity On portable O2  SKIN: warm and  Dry without overt rashes, tattoos. She has a healing area of punch biopsy of the distal right leg. No signs of infection.  LN: right post auricular lymph node is  Small approximately 5 mm smoothe . No other significant lymph nodes on the opposite side or the cervical area of concern. No supraclavicular or axillary nodes.   HEAD and Neck: without JVD, Head and scalp: normal Eyes:No scleral icterus. Fundi normal, eye movements normal. Ears: Auricle normal, canal normal, Tympanic membranes normal, insufflation normal. Nose: normal Throat: normal Neck & thyroid: normal  CHEST & LUNGS: Chest wall: normal Lungs: Clear  CVS: Reveals the PMI to be normally located. Regular rhythm, First and Second Heart sounds are normal,  absence of murmurs, rubs or gallops. Peripheral vasculature: Radial pulses: normal Dorsal pedis pulses: normal Posterior pulses: normal  ABDOMEN:  Appearance: obese Benign, no organomegaly, no masses, no Abdominal Aortic enlargement. No Guarding , no rebound. No Bruits. Bowel sounds: normal  RECTAL: N/A GU: N/A  EXTREMETIES: nonedematous.  NEUROLOGIC: oriented to time,place and person;  nonfocal.   ASSESSMENT: Lymphadenopathy - Plan: POCT CBC, DG Chest 2 View  Chronic respiratory failure  COPD GOLD III - Plan: DG Chest 2 View  Aortic stenosis  HTN (hypertension)  Hyperlipidemia  Leukocytosis - Plan: POCT CBC  OBESITY   PLAN: Orders Placed This Encounter  Procedures  . DG Chest 2 View    Standing Status: Future     Number of Occurrences: 1     Standing Expiration Date: 12/07/2013    Order Specific Question:  Reason for Exam (SYMPTOM  OR DIAGNOSIS REQUIRED)    Answer:  copd, lymphadenopathy    Order Specific Question:  Preferred imaging location?    Answer:  Internal  . POCT CBC    [facility] reading (PRIMARY) by  Dr. Modesto Charon: chronic changes.      Results for orders placed in visit on 10/07/12  POCT CBC      Result Value Range   WBC 9.7  4.6 - 10.2 K/uL   Lymph, poc 2.0  0.6 - 3.4   POC LYMPH PERCENT 20.9  10 - 50 %L   POC Granulocyte 7.0 (*) 2 - 6.9   Granulocyte percent 72.3  37 - 80 %G   RBC 4.1  4.04 - 5.48 M/uL   Hemoglobin 12.1 (*) 12.2 - 16.2 g/dL   HCT, POC 81.1 (*) 91.4 - 47.9 %   MCV 89.8  80 - 97 fL   MCH, POC 29.5  27 - 31.2 pg   MCHC 32.8  31.8 - 35.4 g/dL   RDW, POC 78.2     Platelet Count, POC 260.0  142 - 424 K/uL   MPV 6.5  0 - 99.8 fL    Suspect a viral related or  Recent infectious related lymphadenopathy.  Observe for now  Return in about 6 weeks (around 11/18/2012) for Recheck medical problems.  Bowden Boody P. Modesto Charon, M.D.

## 2012-10-07 NOTE — Telephone Encounter (Signed)
Rxs were signed and faxed to [place]

## 2012-10-07 NOTE — Telephone Encounter (Signed)
rx have been printed out and placed on MW desk to be signed.

## 2012-10-12 ENCOUNTER — Ambulatory Visit (INDEPENDENT_AMBULATORY_CARE_PROVIDER_SITE_OTHER): Payer: Medicare Other | Admitting: Internal Medicine

## 2012-10-12 ENCOUNTER — Encounter: Payer: Self-pay | Admitting: Internal Medicine

## 2012-10-12 VITALS — BP 122/66 | HR 72 | Temp 97.9°F | Ht 66.0 in | Wt 189.0 lb

## 2012-10-12 DIAGNOSIS — J9610 Chronic respiratory failure, unspecified whether with hypoxia or hypercapnia: Secondary | ICD-10-CM

## 2012-10-12 DIAGNOSIS — J449 Chronic obstructive pulmonary disease, unspecified: Secondary | ICD-10-CM

## 2012-10-12 NOTE — Progress Notes (Signed)
Subjective:     Patient ID: Katie Bryant, female   DOB: [birth year]    MRN: [id number]    Brief patient profile:  51 yowf quit smoking 1992 with GOLD III copd dx 2009 eval in pulm clinic p hospitalized at [place] date: 12/21/2010  Discharge date: 12/24/2010  C O P D  ALLERGIC RHINITIS  DYSPNEA  G E R D  Aortic stenosis  Insomnia  Leukocytosis        01/21/2011 f/u ov/Katie Bryant cc  baseline doe Katie Bryant off 02 and on 02 can do the whole store s stopping at 3lpm but discharged on 4lpm does not wear it 24 hours 25% of the time. Uses multiple forms of albuterol. Cough is better, sputum mucoid.  >no changes, med rec next step   02/03/11 Follow up and med review Pt returns for follow up and med review.  We reviewed all her meds and organized them into a med calendar with pt education .  She appears to be taking her meds correctly.  She has returned back to her baseline from recent PNA episode last month. CXR c/w complete clearance   rec follow med calendar   03/11/2011 f/u ov/Katie Bryant cc ok x nose bleeds ? On 02 has calendar but not using it, ad libbing on 02 but overall feels making slow progress with return to nl activity tol and really no limiting sob or cough or need for daytime 02 rec Ok to leave 02 off at rest    06/20/2011 f/u ov/Katie Bryant cc 2-3 weeks  increased cough, and sneezing > mucus turned thick yellow rx abx and steroids x 2 some better but very confused with meds/ names/ not following action plans at bottom of calendar.  Sleeping poorly with nocturnal  And  early am exacerbation  of respiratory  c/o's and  need for noct saba. rec Prednisone 10 mg take  4 each am x 2 days,   2 each am x 2 days,  1 each am x2days and stop  Add pepcid 20 mg one at bedtime (Zantac/ranitidine) 150 mg) one at bedtime GERD diet reviewed Flutter valve should help bring up mucus - use it as much as your want Keep Follow up [facility] office Tuesday June 18  Late add: Clarify 02 use for problem  list at next ov    06/24/2011 f/u ov/Katie Bryant cc breathing some better but thoroughly confused with instructions/ names of meds/ lost calendar we reviwed just 4 days  prior to [place]  - husband came with her today also confused with details of care.  Cough much looser with flutter, producing less thick white mucus only,  not sure which mucinex product she's using, hfa technique quite poor. Did not follow instructions on adding H2  at bedtime "using an acid reducer as needed"  >>budesonide Twice daily  And taper steroids to off   07/09/2011 Follow up and Med review  Returns after recent COPD flare -now resolved and back to her baseline  We reviewed all her meds and organized them into a med calendar w/ pt edcuation  Recent changes were reviewed.  rec Follow med calendar closely and bring to each visit   08/12/2011 f/u ov/Katie Bryant cc breathing about the same, minimal cough, thoroughly confused again with instructions and meds, recognizes the calendar we gave her and remembers receiving it but didn't bring it and not using it.   No unusual cough, purulent sputum or HB symptoms on present rx. Does have chronic  nasal obst symptoms esp at hs takes otc doesn't know name.  >>no changes   08/27/2011    Follow up and med calendar  We reviewed all her meds and organized them into a med calendar w/ pt edcuation  She says she got her old med calendar at last ov b/c she was in a hurry Have suggested she throw away old forms if not needed.  She appears to be taking meds correctly Says she is doing well. No flare in dyspnea or cough No fever or chest pain rec No change in rx > follow calendar  09/29/2011 f/u ov/Katie Bryant has med calendar and doing well until 2 weeks prior to OV  with worse nasal congestion and green mucus production. Had been using allegra daily x years and still having lots of watery rhinitis "all year long" refractory to flonase.   Still no increase need for rescue saba daytime rec Augmentin 875 twice daily  x 10 days  Prednisone 10 mg take  4 each am x 2 days,   2 each am x 2 days,  1 each am x2days and stop  Start Dymista one twice daily  Please see patient coordinator before you leave today  to schedule nebs refills through [facility] See calendar for specific medication instructions   10/07/2011 f/u ov/Katie Bryant did not bring calendar given just 10 days prior to [place]  But says she's following it and breathing and all resp cc's quite a bit better including her watery rhinitis.  Just completing abx, not sure she ever got her prednsone that was called in on previous ov. x - still very confused with names of meds, esp inhalers and nebs. rec Use med calendar No change rx  11/11/2011 f/u ov/Katie Bryant / Katie Bryant with med calendar in hand cc nasal congestion and "nose runs like a faucet" watery esp in ams despite prn allegra.  Still struggling with concept of medication reconciliation but doing some better and using xopenex about twice  Daily, her avg.  rec Chlortrimeton 4 mg at bedtime automatically and again  every 6 hours as needed for stuffy nose, runny nose (instead of allegra)  Take the budesonide along with your morning treatments No obvious daytime variabilty or assoc chronic cough or cp or chest tightness, subjective wheeze overt sinus or hb symptoms. No unusual exp hx   01/13/2012 f/u ov/Katie Bryant cc still confused with meds, not following prev instructions re 1st gen H1 re nasal symptoms has calendar but not using it. Variably sob room to room.  No purulent sputum overt sinus or hb symptoms. Some better with saba.   >>pred /chlor tabs   02/12/2012  Follow up and Med review     Patient returns for a one-month followup and medication review. Last visit. Patient with a COPD exacerbation, and acute rhinitis. Patient was treated with a steroid taper and advised adding chlor tabs. Patient returns today with some improvement with decreased cough, and drainage. We reviewed all her medications and organized them into a  medication calendar with patient education. It does appear that she is taking her medications correctly. However, she does have some difficulty with pronunciation of the medications. She realizes quite a bit on how the medication works and what the bottle says rec No change rx, use med calendar   06/09/2012 f/u ov/Katie Bryant re copd/02 dep  Chief Complaint  Patient presents with  . Follow-up    Pt c/o increased SOB for the past 2 days-she relates this to having nasal congestion and  not getting enough o2 through her nasal cannula. Her sats when she arrived were 66%3lpm pulsed o2---increased to 91%4lpm continuous o2.    thoroughly confused with meds despite calendar, has several written as generics not matching her med calendar. meds in wrong bag (maint vs prns even though each bag is clearly marked with daily vs as neededs. >rx dymista , changed pepcid to zantac 150mg  Twice daily   Ok to leave 02 off at rest but 3lpm at bedtime and up to 4lpm with activity   07/14/2012 Follow up  Returns for follow up and med review,  pt did not bring her meds today.   Tearful today , reports having some issues at home-husband and her not getting a long.  Feels  breathing is doing "real good" and swimming yesterday. No flare in cough or dyspnea.  No wheezing, hemoptysis, fever or edema.  Nasal drainage better with dymista  rec No change rx   10/12/2012 f/u ov/Katie Bryant re:  02 dep copd Cc doe x [facility] "for hours" s stopping Nose better with allegra Rare need for prn saba in any form  No obvious day to day or daytime variabilty or assoc chronic cough or cp or chest tightness, subjective wheeze overt sinus or hb symptoms. No unusual exp hx or h/o childhood pna/ asthma or knowledge of premature birth.  Sleeping ok without nocturnal  or early am exacerbation  of respiratory  c/o's or need for noct saba. Also denies any obvious fluctuation of symptoms with weather or environmental changes or other aggravating or  alleviating factors except as outlined above   Current Medications, Allergies, Complete Past Medical History, Past Surgical History, Family History, and Social History were reviewed in [employer] record.  ROS  The following are not active complaints unless bolded sore throat, dysphagia, dental problems, itching, sneezing,  nasal congestion or excess/ purulent secretions, ear ache,   fever, chills, sweats, unintended wt loss, pleuritic or exertional cp, hemoptysis,  orthopnea pnd or leg swelling, presyncope, palpitations, heartburn, abdominal pain, anorexia, nausea, vomiting, diarrhea  or change in bowel or urinary habits, change in stools or urine, dysuria,hematuria,  rash, arthralgias, visual complaints, headache, numbness weakness or ataxia or problems with walking or coordination,  change in mood/affect or memory.           Objective:   Physical Exam anxious  amb wf nad  Wt 184 01/21/2011  >08/27/2011 186 > 180 09/29/2011 > 10/07/2011  183 > 11/11/2011  181 > 01/13/2012  183 >180 02/12/2012 > 06/09/2012 188 > 189 10/12/2012  HEENT mod non-specific turbinate edema.  Oropharynx no thrush or excess pnd or cobblestoning.  No JVD or cervical adenopathy. Mild accessory muscle hypertrophy. Trachea midline, nl thryroid. Chest was hyperinflated by percussion with diminished breath sounds Regular rate and rhythm without murmur gallop or rub or increase P2 or edema.  Abd: no hsm, nl excursion. Ext warm without cyanosis or clubbing.        CXR 10/07/12 Cardiomegaly without edema. Chronic changes.    Assessment:

## 2012-10-12 NOTE — Patient Instructions (Addendum)
See Tammy NP in 3 months  with all your medications, even over the counter meds, separated in two separate bags, the ones you take no matter what vs the ones you stop once you feel better and take only as needed when you feel you need them.   Tammy  will generate for you a new user friendly medication calendar that will put [place] all on the same page re: your medication use.     Without this process, it simply isn't possible to assure that we are providing  your outpatient care  with  the attention to detail we feel you deserve.   If we cannot assure that you're getting that kind of care,  then we cannot manage your problem effectively from this clinic.  Once you have seen Tammy and we are sure that we're all on the same page with your medication use she will arrange follow up with me.

## 2012-10-14 NOTE — Assessment & Plan Note (Signed)
-   sats 90% RA 03/11/2011     - Dr Young started 02 around 2010    - 3 lpm at hs, 3lpm with activity, as needed at rest  Adequate control on present rx, reviewed > no change in rx needed   

## 2012-10-14 NOTE — Assessment & Plan Note (Addendum)
-   PFT's 06/11/2007  FEV1   0.70 (34%) ratio 32 with 49% improvement p B2    - HFA 75% effective p coaching 06/20/2011 > 50% 06/24/2011  -med calendar 02/03/11  > not using correctly 06/20/11 > not using at all 06/24/2011 , 07/08/2011, 08/12/2011 , 08/27/2011  > using it 09/29/2011 > not using correctly 01/13/12 -med calendar redo 02/12/2012   I had an extended discussion with the patient and husband  today lasting 15 to 20 minutes of a 25 minute visit on the following issues:   Doing much better with med calendar use which separates maint vs prns which are listed with a specific action plan  No change in rx needed - the key is to maintain accurate and updated med rec/med calendar and be sure they 100% correlate between offices and ov's  See instructions for specific recommendations which were reviewed directly with the patient who was given a copy with highlighter outlining the key components.

## 2012-10-29 ENCOUNTER — Other Ambulatory Visit: Payer: Self-pay | Admitting: Internal Medicine

## 2012-11-08 ENCOUNTER — Ambulatory Visit: Payer: Medicare Other | Admitting: Family Medicine

## 2012-11-10 ENCOUNTER — Other Ambulatory Visit: Payer: Self-pay

## 2012-11-10 NOTE — Telephone Encounter (Signed)
Last lipid 03/08/12  FPW

## 2012-11-11 MED ORDER — ATORVASTATIN CALCIUM 40 MG PO TABS: 40.0000 mg | ORAL_TABLET | Freq: Every day | ORAL | Status: DC

## 2012-11-11 NOTE — Telephone Encounter (Signed)
Patient needs to be seen. Has exceeded time since last visit. Limited quantity refilled. Needs to bring all medications to next appointment.   

## 2012-12-04 ENCOUNTER — Other Ambulatory Visit: Payer: Self-pay | Admitting: Adult Health

## 2012-12-07 NOTE — Telephone Encounter (Signed)
Electronic refill request received from pharmacy for Pulmicort neb Last ov 10/2012 w/ MW Refill request approved w/ 5 additional refills

## 2012-12-10 ENCOUNTER — Telehealth: Payer: Self-pay | Admitting: Internal Medicine

## 2012-12-10 NOTE — Telephone Encounter (Signed)
I called and spoke with pt and is aware of recs. Nothing further needed

## 2012-12-10 NOTE — Telephone Encounter (Signed)
pulmicort inhaler best choice 4 puffs bid

## 2012-12-10 NOTE — Telephone Encounter (Signed)
Called and spoke with pt. She reports she received a letter from her insurance stating her co-pay for the pulmicort neb will go up to $95 next year. Right now she only pays $45 co pay. She can;t afford this plus her other medications. The alternatives for $45 co=pay were flovent diskus and pulmicort inhaler. Please advise MW thanks

## 2012-12-20 ENCOUNTER — Other Ambulatory Visit: Payer: Self-pay | Admitting: Family Medicine

## 2012-12-22 NOTE — Telephone Encounter (Signed)
Last seen 10/07/12  FPW  No lipids since EPIC

## 2012-12-23 NOTE — Telephone Encounter (Signed)
Patient needs to be seen. Has exceeded time since last visit. Limited quantity refilled. Needs to bring all medications to next appointment.   

## 2012-12-24 ENCOUNTER — Other Ambulatory Visit: Payer: Self-pay | Admitting: Family Medicine

## 2012-12-24 ENCOUNTER — Other Ambulatory Visit: Payer: Self-pay | Admitting: Internal Medicine

## 2012-12-27 ENCOUNTER — Other Ambulatory Visit: Payer: Self-pay | Admitting: Internal Medicine

## 2012-12-27 NOTE — Telephone Encounter (Signed)
Last seen 10/07/12  FPW  Last lipid 07/03/10

## 2012-12-28 NOTE — Telephone Encounter (Signed)
Patient needs to be seen. Has exceeded time since last visit. Limited quantity refilled. Needs to bring all medications to next appointment.   

## 2013-01-02 ENCOUNTER — Other Ambulatory Visit: Payer: Self-pay | Admitting: Family Medicine

## 2013-01-03 ENCOUNTER — Telehealth: Payer: Self-pay | Admitting: Internal Medicine

## 2013-01-03 MED ORDER — ALPRAZOLAM 1 MG PO TABS: 1.0000 mg | ORAL_TABLET | Freq: Two times a day (BID) | ORAL | Status: DC | PRN

## 2013-01-03 NOTE — Telephone Encounter (Signed)
I spoke with pt. She is requesting refill on alprazolam 1 mg. This is no longer on her medication list but pt reports she still takes this. Pt last had this refilled 10/02/12 #60 x 0 refills. Please advise MW thanks

## 2013-01-03 NOTE — Telephone Encounter (Signed)
Ok x one refill but not a pulmonary med so all further refills per her primary

## 2013-01-03 NOTE — Telephone Encounter (Signed)
Pt aware of recs. RX called into [facility]. Nothing further needed

## 2013-01-05 ENCOUNTER — Telehealth: Payer: Self-pay | Admitting: Cardiology

## 2013-01-05 ENCOUNTER — Telehealth: Payer: Self-pay | Admitting: Family Medicine

## 2013-01-05 NOTE — Telephone Encounter (Signed)
New Problem:  Pt states she has had a roaring in her head the past week or so. Pt states  her BP was 190 then it went to 159/86. Pt does not have the bottom # for the 190 value. Pt would like to be advised on what to do.

## 2013-01-05 NOTE — Telephone Encounter (Signed)
Returned call to patient she stated she has had a roaring in head for over a week.Stated it was better today,B/P 148/85 pulse 57.Stated PCP told her to go to ER and she did not want to go to ER.Patient wanting appointment with Dr.Hochrein.States she sees him in the [place] office.Advised to call [facility] office for appointment with Dr.Hochrein.

## 2013-01-05 NOTE — Telephone Encounter (Signed)
Follow up    Pt returned RN call.

## 2013-01-05 NOTE — Telephone Encounter (Signed)
Returned call to patient no answer.LMTC. 

## 2013-01-05 NOTE — Telephone Encounter (Signed)
BP 190/80 last night, head and eyes achy. She has been this way 3 days

## 2013-01-05 NOTE — Telephone Encounter (Signed)
Called spoke to pt she states BP last night 190/80. States she checked it this am and it was 150/80 c/o "bad headache " and not feeling well Per Dr Modesto Charon pt needs to go to ER for assessment and eval. Pt verbalizes understanding

## 2013-01-06 ENCOUNTER — Other Ambulatory Visit: Payer: Self-pay | Admitting: Family Medicine

## 2013-01-12 ENCOUNTER — Encounter: Payer: Self-pay | Admitting: Adult Health

## 2013-01-12 ENCOUNTER — Ambulatory Visit (INDEPENDENT_AMBULATORY_CARE_PROVIDER_SITE_OTHER): Payer: Medicare Other | Admitting: Adult Health

## 2013-01-12 VITALS — BP 136/64 | HR 103 | Temp 97.1°F | Ht 66.0 in | Wt 188.8 lb

## 2013-01-12 DIAGNOSIS — J449 Chronic obstructive pulmonary disease, unspecified: Secondary | ICD-10-CM

## 2013-01-12 MED ORDER — AMOXICILLIN-POT CLAVULANATE 875-125 MG PO TABS: 1.0000 | ORAL_TABLET | Freq: Two times a day (BID) | ORAL | Status: AC

## 2013-01-12 NOTE — Patient Instructions (Signed)
Follow med calendar closely and bring to each visit.  Augmentin 875mg  Twice daily  For 7 days -take w/ food  Continue on current regimen  Follow up Dr. [name]  In 3 months and As needed   Please contact office for sooner follow up if symptoms do not improve or worsen or seek emergency care

## 2013-01-14 ENCOUNTER — Ambulatory Visit (INDEPENDENT_AMBULATORY_CARE_PROVIDER_SITE_OTHER): Payer: Medicare Other

## 2013-01-14 ENCOUNTER — Ambulatory Visit (INDEPENDENT_AMBULATORY_CARE_PROVIDER_SITE_OTHER): Payer: Medicare Other | Admitting: Physician Assistant

## 2013-01-14 VITALS — BP 179/74 | HR 92 | Temp 98.1°F | Ht 66.0 in | Wt 188.0 lb

## 2013-01-14 DIAGNOSIS — R05 Cough: Secondary | ICD-10-CM

## 2013-01-14 DIAGNOSIS — R059 Cough, unspecified: Secondary | ICD-10-CM

## 2013-01-14 NOTE — Progress Notes (Signed)
   Subjective:    Patient ID: Katie Bryant, female    DOB: 12-Oct-1928, 80 y.o.   MRN: [id number]  Cough Associated symptoms include shortness of breath (increased) and wheezing. Pertinent negatives include no ear pain, fever, postnasal drip, rhinorrhea or sore throat.   79 y/o female presents w/ c/o productive cough x 3+ days. Was seen at her pulmonologist on 01/12/13, given Augmentin at that time for COPD flare. She is on continuous oxygen with a nasal canula and presents with a portable machine today.   Has not been using her inhalers at home on a consistent basis as directed.    Review of Systems  Constitutional: Positive for fatigue. Negative for fever.  HENT: Positive for congestion (chest). Negative for ear pain, postnasal drip, rhinorrhea, sinus pressure, sneezing and sore throat.   Eyes: Negative.   Respiratory: Positive for cough (productive), chest tightness, shortness of breath (increased) and wheezing. Negative for apnea.   Cardiovascular: Negative.        Objective:   Physical Exam  Cardiovascular:  Murmur heard. Pulmonary/Chest:  Patient has productive cough during visit. O2 levels: 82 walking, 85 sitting and 96 during recovery when transitioned to our tank on 3 liters.   She appeared anxious and very talkative with occasional pauses between breaths. Much improved when changed over to our machine.           Assessment & Plan:  1. COPD Flare: continue treatment plan with Augmentin as prescribed by Pulmonologist on 01/12/13. Explained to husband and patient the importance and benefits of using inhalers as directed. Changed home care to [facility] at patient's request due to malfunctioning equipment. Despite patient's request, I advised against  Depomedrol injection.   RTC if s/s worsen or do not improve.

## 2013-01-14 NOTE — Progress Notes (Signed)
Pt walked with 02 3L [place]   Pulse ox 80% walking  Pulse OX sitting /rest 82 % Recovery Pulse 02 3L was 96 5 -- we changed her 02 to our tank of 02 and immediately recovered to 96%

## 2013-01-14 NOTE — Progress Notes (Signed)
Subjective:     Patient ID: Katie Bryant, female   DOB: [birth year]    MRN: [id number]    Brief patient profile:  51 yowf quit smoking 1992 with GOLD III copd dx 2009 eval in pulm clinic p hospitalized at [place] date: 12/21/2010  Discharge date: 12/24/2010  C O P D  ALLERGIC RHINITIS  DYSPNEA  G E R D  Aortic stenosis  Insomnia  Leukocytosis        01/21/2011 f/u ov/Wert cc  baseline doe Kristopher Oppenheim off 02 and on 02 can do the whole store s stopping at 3lpm but discharged on 4lpm does not wear it 24 hours 25% of the time. Uses multiple forms of albuterol. Cough is better, sputum mucoid.  >no changes, med rec next step   02/03/11 Follow up and med review Pt returns for follow up and med review.  We reviewed all her meds and organized them into a med calendar with pt education .  She appears to be taking her meds correctly.  She has returned back to her baseline from recent PNA episode last month. CXR c/w complete clearance   rec follow med calendar   03/11/2011 f/u ov/Wert cc ok x nose bleeds ? On 02 has calendar but not using it, ad libbing on 02 but overall feels making slow progress with return to nl activity tol and really no limiting sob or cough or need for daytime 02 rec Ok to leave 02 off at rest    06/20/2011 f/u ov/Wert cc 2-3 weeks  increased cough, and sneezing > mucus turned thick yellow rx abx and steroids x 2 some better but very confused with meds/ names/ not following action plans at bottom of calendar.  Sleeping poorly with nocturnal  And  early am exacerbation  of respiratory  c/o's and  need for noct saba. rec Prednisone 10 mg take  4 each am x 2 days,   2 each am x 2 days,  1 each am x2days and stop  Add pepcid 20 mg one at bedtime (Zantac/ranitidine) 150 mg) one at bedtime GERD diet reviewed Flutter valve should help bring up mucus - use it as much as your want Keep Follow up [facility] office Tuesday June 18  Late add: Clarify 02 use for problem  list at next ov    06/24/2011 f/u ov/Wert cc breathing some better but thoroughly confused with instructions/ names of meds/ lost calendar we reviwed just 4 days  prior to [place]  - husband came with her today also confused with details of care.  Cough much looser with flutter, producing less thick white mucus only,  not sure which mucinex product she's using, hfa technique quite poor. Did not follow instructions on adding H2  at bedtime "using an acid reducer as needed"  >>budesonide Twice daily  And taper steroids to off   07/09/2011 Follow up and Med review  Returns after recent COPD flare -now resolved and back to her baseline  We reviewed all her meds and organized them into a med calendar w/ pt edcuation  Recent changes were reviewed.  rec Follow med calendar closely and bring to each visit   08/12/2011 f/u ov/Wert cc breathing about the same, minimal cough, thoroughly confused again with instructions and meds, recognizes the calendar we gave her and remembers receiving it but didn't bring it and not using it.   No unusual cough, purulent sputum or HB symptoms on present rx. Does have chronic  nasal obst symptoms esp at hs takes otc doesn't know name.  >>no changes   08/27/2011    Follow up and med calendar  We reviewed all her meds and organized them into a med calendar w/ pt edcuation  She says she got her old med calendar at last ov b/c she was in a hurry Have suggested she throw away old forms if not needed.  She appears to be taking meds correctly Says she is doing well. No flare in dyspnea or cough No fever or chest pain rec No change in rx > follow calendar  09/29/2011 f/u ov/Wert has med calendar and doing well until 2 weeks prior to OV  with worse nasal congestion and green mucus production. Had been using allegra daily x years and still having lots of watery rhinitis "all year long" refractory to flonase.   Still no increase need for rescue saba daytime rec Augmentin 875 twice daily  x 10 days  Prednisone 10 mg take  4 each am x 2 days,   2 each am x 2 days,  1 each am x2days and stop  Start Dymista one twice daily  Please see patient coordinator before you leave today  to schedule nebs refills through [facility] See calendar for specific medication instructions   10/07/2011 f/u ov/Wert did not bring calendar given just 10 days prior to [place]  But says she's following it and breathing and all resp cc's quite a bit better including her watery rhinitis.  Just completing abx, not sure she ever got her prednsone that was called in on previous ov. x - still very confused with names of meds, esp inhalers and nebs. rec Use med calendar No change rx  11/11/2011 f/u ov/Wert / [name] with med calendar in hand cc nasal congestion and "nose runs like a faucet" watery esp in ams despite prn allegra.  Still struggling with concept of medication reconciliation but doing some better and using xopenex about twice  Daily, her avg.  rec Chlortrimeton 4 mg at bedtime automatically and again  every 6 hours as needed for stuffy nose, runny nose (instead of allegra)  Take the budesonide along with your morning treatments No obvious daytime variabilty or assoc chronic cough or cp or chest tightness, subjective wheeze overt sinus or hb symptoms. No unusual exp hx   01/13/2012 f/u ov/Wert cc still confused with meds, not following prev instructions re 1st gen H1 re nasal symptoms has calendar but not using it. Variably sob room to room.  No purulent sputum overt sinus or hb symptoms. Some better with saba.   >>pred /chlor tabs   02/12/2012  Follow up and Med review     Patient returns for a one-month followup and medication review. Last visit. Patient with a COPD exacerbation, and acute rhinitis. Patient was treated with a steroid taper and advised adding chlor tabs. Patient returns today with some improvement with decreased cough, and drainage. We reviewed all her medications and organized them into a  medication calendar with patient education. It does appear that she is taking her medications correctly. However, she does have some difficulty with pronunciation of the medications. She realizes quite a bit on how the medication works and what the bottle says rec No change rx, use med calendar   06/09/2012 f/u ov/Wert re copd/02 dep  Chief Complaint  Patient presents with  . Follow-up    Pt c/o increased SOB for the past 2 days-she relates this to having nasal congestion and  not getting enough o2 through her nasal cannula. Her sats when she arrived were 66%3lpm pulsed o2---increased to 91%4lpm continuous o2.    thoroughly confused with meds despite calendar, has several written as generics not matching her med calendar. meds in wrong bag (maint vs prns even though each bag is clearly marked with daily vs as neededs. >rx dymista , changed pepcid to zantac 150mg  Twice daily   Ok to leave 02 off at rest but 3lpm at bedtime and up to 4lpm with activity   07/14/2012 Follow up  Returns for follow up and med review,  pt did not bring her meds today.   Tearful today , reports having some issues at home-husband and her not getting a long.  Feels  breathing is doing "real good" and swimming yesterday. No flare in cough or dyspnea.  No wheezing, hemoptysis, fever or edema.  Nasal drainage better with dymista  rec No change rx   10/12/2012 f/u ov/Wert re:  02 dep copd Cc doe x [employer] "for hours" s stopping Nose better with allegra Rare need for prn saba in any form >>No changes   01/12/13 followup and medication review. Patient returns for followup and medication review. We Reviewed all her medications and organized them into a medication calendar with patient education. It appears the patient is taking her medications correctly est med calendar - pt brought all meds with her today.  pt She does complains of some chest congestion, prod cough w/ thick clear mucus, increased DOE, chest tightness w/  coughing x3days.  denies f/c/s, wheezing, dyspnea at rest, nausea, vomiting   Current Medications, Allergies, Complete Past Medical History, Past Surgical History, Family History, and Social History were reviewed in [employer] record.  ROS  The following are not active complaints unless bolded sore throat, dysphagia, dental problems, itching,  unintended wt loss, pleuritic or exertional cp, hemoptysis,  orthopnea pnd or leg swelling, presyncope, palpitations, heartburn, abdominal pain, anorexia, nausea, vomiting, diarrhea  or change in bowel or urinary habits, change in stools or urine, dysuria,hematuria,  rash, arthralgias, visual complaints, headache, numbness weakness or ataxia or problems with walking or coordination,  change in mood/affect or memory.           Objective:   Physical Exam anxious  amb wf nad  Wt 184 01/21/2011  >08/27/2011 186 > 180 09/29/2011 > 10/07/2011  183 > 11/11/2011  181 > 01/13/2012  183 >180 02/12/2012 > 06/09/2012 188 > 189 10/12/2012 >188 01/14/2013  HEENT mod non-specific turbinate edema.  Oropharynx no thrush or excess pnd or cobblestoning.  No JVD or cervical adenopathy. Mild accessory muscle hypertrophy. Trachea midline, nl thryroid. Chest was hyperinflated by percussion with diminished breath sounds Regular rate and rhythm without murmur gallop or rub or increase P2 or edema.  Abd: no hsm, nl excursion. Ext warm without cyanosis or clubbing.        CXR 10/07/12 Cardiomegaly without edema. Chronic changes.    Assessment:

## 2013-01-14 NOTE — Assessment & Plan Note (Signed)
Exacerbation  Patient's medications were reviewed today and patient education was given. Computerized medication calendar was adjusted/completed   Plan  Follow med calendar closely and bring to each visit.  Augmentin 875mg  Twice daily  For 7 days -take w/ food  Continue on current regimen  Follow up Dr. [name]  In 3 months and As needed   Please contact office for sooner follow up if symptoms do not improve or worsen or seek emergency care

## 2013-01-15 ENCOUNTER — Telehealth: Payer: Self-pay | Admitting: Internal Medicine

## 2013-01-15 ENCOUNTER — Telehealth: Payer: Self-pay | Admitting: Pulmonary Disease

## 2013-01-15 NOTE — Telephone Encounter (Signed)
On call- Nasal congestion w/ hx Afrin over-use. Was confused about how to apply Dymista but didn't think it helped. Augmentin gave diarrhea Rec- Use Dymista as instructed, once each day. Ok to use Sudafed-PE once each morning x 2 days. Try nasal saline spray. Stop augmentin.

## 2013-01-15 NOTE — Telephone Encounter (Signed)
augmentin caused diarrhea C/o green phlegm  Called in z-pak instead

## 2013-01-18 ENCOUNTER — Emergency Department (HOSPITAL_COMMUNITY): Payer: Medicare Other

## 2013-01-18 ENCOUNTER — Emergency Department (HOSPITAL_COMMUNITY)
Admission: EM | Admit: 2013-01-18 | Discharge: 2013-01-18 | Disposition: A | Discharge status: Home / Self Care | Payer: Medicare Other | Attending: Emergency Medicine | Admitting: Emergency Medicine

## 2013-01-18 ENCOUNTER — Encounter (HOSPITAL_COMMUNITY): Payer: Self-pay | Admitting: Emergency Medicine

## 2013-01-18 DIAGNOSIS — K219 Gastro-esophageal reflux disease without esophagitis: Secondary | ICD-10-CM | POA: Insufficient documentation

## 2013-01-18 DIAGNOSIS — J45901 Unspecified asthma with (acute) exacerbation: Principal | ICD-10-CM

## 2013-01-18 DIAGNOSIS — Z792 Long term (current) use of antibiotics: Secondary | ICD-10-CM | POA: Insufficient documentation

## 2013-01-18 DIAGNOSIS — F329 Major depressive disorder, single episode, unspecified: Secondary | ICD-10-CM | POA: Insufficient documentation

## 2013-01-18 DIAGNOSIS — I10 Essential (primary) hypertension: Secondary | ICD-10-CM | POA: Insufficient documentation

## 2013-01-18 DIAGNOSIS — F411 Generalized anxiety disorder: Secondary | ICD-10-CM | POA: Insufficient documentation

## 2013-01-18 DIAGNOSIS — J441 Chronic obstructive pulmonary disease with (acute) exacerbation: Secondary | ICD-10-CM | POA: Insufficient documentation

## 2013-01-18 DIAGNOSIS — Z79899 Other long term (current) drug therapy: Secondary | ICD-10-CM | POA: Insufficient documentation

## 2013-01-18 DIAGNOSIS — Z9089 Acquired absence of other organs: Secondary | ICD-10-CM | POA: Insufficient documentation

## 2013-01-18 DIAGNOSIS — Z7982 Long term (current) use of aspirin: Secondary | ICD-10-CM | POA: Insufficient documentation

## 2013-01-18 DIAGNOSIS — M129 Arthropathy, unspecified: Secondary | ICD-10-CM | POA: Insufficient documentation

## 2013-01-18 DIAGNOSIS — Z9981 Dependence on supplemental oxygen: Secondary | ICD-10-CM | POA: Insufficient documentation

## 2013-01-18 DIAGNOSIS — F3289 Other specified depressive episodes: Secondary | ICD-10-CM | POA: Insufficient documentation

## 2013-01-18 DIAGNOSIS — F0390 Unspecified dementia without behavioral disturbance: Secondary | ICD-10-CM | POA: Insufficient documentation

## 2013-01-18 DIAGNOSIS — Z853 Personal history of malignant neoplasm of breast: Secondary | ICD-10-CM | POA: Insufficient documentation

## 2013-01-18 DIAGNOSIS — Z95818 Presence of other cardiac implants and grafts: Secondary | ICD-10-CM | POA: Insufficient documentation

## 2013-01-18 DIAGNOSIS — Z87891 Personal history of nicotine dependence: Secondary | ICD-10-CM | POA: Insufficient documentation

## 2013-01-18 DIAGNOSIS — E669 Obesity, unspecified: Secondary | ICD-10-CM | POA: Insufficient documentation

## 2013-01-18 DIAGNOSIS — Z8701 Personal history of pneumonia (recurrent): Secondary | ICD-10-CM | POA: Insufficient documentation

## 2013-01-18 LAB — COMPREHENSIVE METABOLIC PANEL
ALT: 34 U/L (ref 0–35)
AST: 35 U/L (ref 0–37)
Albumin: 3.5 g/dL (ref 3.5–5.2)
Alkaline Phosphatase: 65 U/L (ref 39–117)
BILIRUBIN TOTAL: 0.3 mg/dL (ref 0.3–1.2)
BUN: 9 mg/dL (ref 6–23)
CHLORIDE: 102 meq/L (ref 96–112)
CO2: 31 mEq/L (ref 19–32)
Calcium: 9.1 mg/dL (ref 8.4–10.5)
Creatinine, Ser: 0.58 mg/dL (ref 0.50–1.10)
GFR calc Af Amer: >90 mL/min (ref >90)
GFR calc non Af Amer: 84 mL/min — ABNORMAL LOW (ref >90)
Glucose, Bld: 99 mg/dL (ref 70–99)
Potassium: 3.9 mEq/L (ref 3.7–5.3)
SODIUM: 142 meq/L (ref 137–147)
Total Protein: 7 g/dL (ref 6.0–8.3)

## 2013-01-18 LAB — CBC WITH DIFFERENTIAL/PLATELET
BASOS ABS: 0 10*3/uL (ref 0.0–0.1)
Basophils Relative: 0 % (ref 0–1)
Eosinophils Absolute: 0.2 10*3/uL (ref 0.0–0.7)
Eosinophils Relative: 2 % (ref 0–5)
HCT: 35.1 % — ABNORMAL LOW (ref 36.0–46.0)
Hemoglobin: 11.4 g/dL — ABNORMAL LOW (ref 12.0–15.0)
LYMPHS PCT: 29 % (ref 12–46)
Lymphs Abs: 2 10*3/uL (ref 0.7–4.0)
MCH: 29.7 pg (ref 26.0–34.0)
MCHC: 32.5 g/dL (ref 30.0–36.0)
MCV: 91.4 fL (ref 78.0–100.0)
Monocytes Absolute: 0.6 10*3/uL (ref 0.1–1.0)
Monocytes Relative: 8 % (ref 3–12)
NEUTROS ABS: 4.2 10*3/uL (ref 1.7–7.7)
NEUTROS PCT: 61 % (ref 43–77)
PLATELETS: 183 10*3/uL (ref 150–400)
RBC: 3.84 MIL/uL — ABNORMAL LOW (ref 3.87–5.11)
RDW: 12.6 % (ref 11.5–15.5)
WBC: 6.9 10*3/uL (ref 4.0–10.5)

## 2013-01-18 LAB — TROPONIN I: Troponin I: <0.3 ng/mL (ref <0.30)

## 2013-01-18 MED ORDER — IPRATROPIUM-ALBUTEROL 0.5-2.5 (3) MG/3ML IN SOLN
3.0000 mL | Freq: Once | RESPIRATORY_TRACT | Status: AC
  Administered 2013-01-18: 3 mL via RESPIRATORY_TRACT
  Filled 2013-01-18: qty 3

## 2013-01-18 MED ORDER — METHYLPREDNISOLONE SODIUM SUCC 125 MG IJ SOLR
125.0000 mg | INTRAMUSCULAR | Status: AC
  Administered 2013-01-18: 125 mg via INTRAVENOUS
  Filled 2013-01-18: qty 2

## 2013-01-18 MED ORDER — PREDNISONE 50 MG PO TABS: ORAL_TABLET | ORAL | Status: DC

## 2013-01-18 MED ORDER — GUAIFENESIN ER 600 MG PO TB12: 600.0000 mg | ORAL_TABLET | Freq: Two times a day (BID) | ORAL | Status: DC

## 2013-01-18 NOTE — ED Notes (Signed)
MD at bedside. 

## 2013-01-18 NOTE — ED Notes (Signed)
Pt is a copd pt and is on ox 3l at home cont and has been sick for 3 days coughing up green phlem. Pt increased her ox to 4l and has ox sats of 84%. Pt talking in 2 word phrases.

## 2013-01-18 NOTE — ED Provider Notes (Signed)
CSN: [id number]     Arrival date & time 01/18/13  1258 History   First MD Initiated Contact with Patient 01/18/13 1306     Chief Complaint  Patient presents with  . Shortness of Breath   (Consider location/radiation/quality/duration/timing/severity/associated sxs/prior Treatment) Patient is a 80 y.o. female presenting with shortness of breath. The history is provided by the patient.  Shortness of Breath Severity:  Mild Onset quality:  Gradual Duration:  1 week Timing:  Constant Progression:  Unchanged Chronicity:  New Relieved by:  Rest Worsened by:  Activity Ineffective treatments:  None tried Associated symptoms: cough (green, yellow sputum) and sputum production   Associated symptoms: no abdominal pain, no chest pain, no fever, no headaches, no neck pain and no vomiting     Past Medical History  Diagnosis Date  . COPD (chronic obstructive pulmonary disease)   . Oxygen dependent   . Aortic stenosis, mild   . Obesity   . Hypertension   . Anxiety   . Back pain   . Headache(784.0)   . Breast cancer   . Asthma   . Sleep apnea   . DEMENTIA   . GERD (gastroesophageal reflux disease)   . Arthritis   . Pneumonia   . H/O hiatal hernia   . Depression   . Lymphadenopathy 10/07/2012   Past Surgical History  Procedure Laterality Date  . Cardiac catheterization      EF 55-60%  . Tonsillectomy and adenoidectomy  1942  . Breast reconstruction    . Appendectomy    . Mastectomy  1980   Family History  Problem Relation Age of Onset  . Tuberculosis Mother   . Lung cancer Father    History  Substance Use Topics  . Smoking status: Former Smoker -- 1.00 packs/day for 40 years    Types: Cigarettes    Quit date: 02/11/1990  . Smokeless tobacco: Never Used  . Alcohol Use: 4.2 oz/week    7 Glasses of wine per week   OB History   Grav Para Term Preterm Abortions TAB SAB Ect Mult Living                 Review of Systems  Constitutional: Negative for fever and fatigue.   HENT: Negative for congestion and drooling.   Eyes: Negative for pain.  Respiratory: Positive for cough (green, yellow sputum), sputum production and shortness of breath.   Cardiovascular: Negative for chest pain.  Gastrointestinal: Negative for nausea, vomiting, abdominal pain and diarrhea.  Genitourinary: Negative for dysuria and hematuria.  Musculoskeletal: Negative for back pain, gait problem and neck pain.  Skin: Negative for color change.  Neurological: Negative for dizziness and headaches.  Hematological: Negative for adenopathy.  Psychiatric/Behavioral: Negative for behavioral problems.  All other systems reviewed and are negative.    Allergies  Other  Home Medications   Current Outpatient Rx  Name  Route  Sig  Dispense  Refill  . acetaminophen (TYLENOL) 500 MG tablet   Oral   Take 500 mg by mouth every 6 (six) hours as needed.         . ALPRAZolam (XANAX) 1 MG tablet   Oral   Take 1 tablet (1 mg total) by mouth 2 (two) times daily as needed for anxiety.   60 tablet   0   . amoxicillin-clavulanate (AUGMENTIN) 875-125 MG per tablet   Oral   Take 1 tablet by mouth 2 (two) times daily.   14 tablet   0   .  aspirin 81 MG tablet   Oral   Take 81 mg by mouth daily.         Marland Kitchen atorvastatin (LIPITOR) 40 MG tablet      TAKE ONE TABLET BY MOUTH ONCE DAILY. NEEDS LAB WORK.   30 tablet   0   . budesonide (PULMICORT) 0.5 MG/2ML nebulizer solution      USE ONE VIAL IN NEBULIZER TWICE DAILY   120 mL   5   . chlorpheniramine (CHLOR-TRIMETON) 4 MG tablet      2 tabs by mouth at bedtime.  May add 1 extra every 6 hours as needed for drippy nose         . fenofibrate micronized (LOFIBRA) 134 MG capsule      TAKE ONE CAPSULE BY MOUTH ONCE DAILY BEFORE BREAKFAST   30 capsule   6   . ipratropium-albuterol (DUONEB) 0.5-2.5 (3) MG/3ML SOLN   Nebulization   Take 3 mLs by nebulization 4 (four) times daily.   360 mL   11   . meclizine (ANTIVERT) 25 MG tablet    Oral   Take 25 mg by mouth every 8 (eight) hours as needed for dizziness.         . metoprolol tartrate (LOPRESSOR) 25 MG tablet   Oral   Take 0.5 tablets (12.5 mg total) by mouth 2 (two) times daily.   90 tablet   4   . OXYGEN-HELIUM IN      Wear 3 lpm continuously and increase to to 4 lpm as needed for shortness of breath w/ activity         . ranitidine (ZANTAC) 150 MG tablet   Oral   Take 150 mg by mouth at bedtime.          Marland Kitchen Respiratory Therapy Supplies (FLUTTER) DEVI      Use as directed   1 each   0   . sodium chloride (OCEAN) 0.65 % SOLN nasal spray      2 puffs every 4-6 hours as needed for nasal congestion         . valsartan-hydrochlorothiazide (DIOVAN-HCT) 80-12.5 MG per tablet      TAKE ONE TABLET BY MOUTH EVERY DAY   90 tablet   0   . VENTOLIN HFA 108 (90 BASE) MCG/ACT inhaler      INHALE TWO PUFFS INTO LUNGS EVERY 6 HOURS AS NEEDED   18 each   0    BP 158/58  Pulse 72  Temp(Src) 97.9 F (36.6 C) (Oral)  Resp 32  SpO2 97% Physical Exam  Nursing note and vitals reviewed. Constitutional: She is oriented to person, place, and time. She appears well-developed and well-nourished.  HENT:  Head: Normocephalic.  Mouth/Throat: Oropharynx is clear and moist. No oropharyngeal exudate.  Eyes: Conjunctivae and EOM are normal. Pupils are equal, round, and reactive to light.  Neck: Normal range of motion. Neck supple.  Cardiovascular: Normal rate, regular rhythm, normal heart sounds and intact distal pulses.  Exam reveals no gallop and no friction rub.   No murmur heard. Pulmonary/Chest: Effort normal. No respiratory distress. She has wheezes (mild expiratory wheeze and bilateral lungs.).  Abdominal: Soft. Bowel sounds are normal. There is no tenderness. There is no rebound and no guarding.  Musculoskeletal: Normal range of motion. She exhibits no edema and no tenderness.  Neurological: She is alert and oriented to person, place, and time.  Skin:  Skin is warm and dry.  Psychiatric: She has a normal mood  and affect. Her behavior is normal.    ED Course  Procedures (including critical care time) Labs Review Labs Reviewed  CBC WITH DIFFERENTIAL - Abnormal; Notable for the following:    RBC 3.84 (*)    Hemoglobin 11.4 (*)    HCT 35.1 (*)    All other components within normal limits  COMPREHENSIVE METABOLIC PANEL - Abnormal; Notable for the following:    GFR calc non Af Amer 84 (*)    All other components within normal limits  TROPONIN I   Imaging Review Dg Chest 2 View  01/18/2013   CLINICAL DATA:  Productive cough and short of breath.  Breast cancer  EXAM: CHEST  2 VIEW  COMPARISON:  01/14/2013  FINDINGS: COPD with hyperinflation and scarring. Mild cardiac enlargement. Negative for heart failure or pneumonia. No significant effusion.  IMPRESSION: COPD.  No acute abnormality.   Electronically Signed   By: Franchot Gallo M.D.   On: 01/18/2013 14:36    EKG Interpretation    Date/Time:  Tuesday January 18 2013 13:51:52 EST Ventricular Rate:  73 PR Interval:  149 QRS Duration: 118 QT Interval:  467 QTC Calculation: 515 R Axis:   -54 Text Interpretation:  Sinus rhythm Left anterior fascicular block Probable left ventricular hypertrophy Non-specific ST changes in V5 Baseline wander in lead(s) III Confirmed by Esty Ahuja  MD, Natalina Wieting ([id number]) on 01/18/2013 2:03:35 PM            MDM   1. COPD exacerbation    1:21 PM 80 y.o. female with a history of COPD is on 3 L nasal cannula at baseline who presents with productive cough for one week. She also notes mild shortness of breath which is worse with ambulation. She was seen by her PCP one week ago and started on Augmentin. She refused oral steroids at that time. She notes continued symptoms and presents today for evaluation. On exam she is stable on her 3 L nasal cannula. She denies any fevers at home and is afebrile with unremarkable vital signs here. Will get screening labwork,  imaging, IV solumedrol and breathing treatment for mild wheezing heard on exam.  2:58 PM: Pt feeling better after breathing tx x1, some mild residual wheezing noted. She does not want any more breathing tx's. She did not want prednisone at home bc it makes her swell, but she agreed to take 50mg  x 2 days. I suspect she has a copd exac.  Will have her finish the augmentin. Labs otherwise non-contrib. I have discussed the diagnosis/risks/treatment options with the patient and believe the pt to be eligible for discharge home to follow-up with pcp in 2 days if no better. We also discussed returning to the ED immediately if new or worsening sx occur. We discussed the sx which are most concerning (e.g., worsening sob, fever, inc O2 requirement) that necessitate immediate return. Any new prescriptions provided to the patient are listed below.  New Prescriptions   GUAIFENESIN (MUCINEX) 600 MG 12 HR TABLET    Take 1 tablet (600 mg total) by mouth 2 (two) times daily.   PREDNISONE (DELTASONE) 50 MG TABLET    Take 1 tablet by mouth daily for the next 2 days.     Blanchard Kelch, MD 01/18/13 [phone]

## 2013-01-20 ENCOUNTER — Telehealth: Payer: Self-pay | Admitting: Internal Medicine

## 2013-01-20 NOTE — Telephone Encounter (Signed)
No need for zpak if finished augmentin  If wants me to help will need ov with all meds and med calendar in hand (or see her primary or Tammy NP) as not able to treat her effectively over the phone

## 2013-01-20 NOTE — Telephone Encounter (Signed)
Pt aware of need to make appt with MW with all meds in hand and med calendar. appt 16/15 at 1045 w/MW

## 2013-01-20 NOTE — Telephone Encounter (Signed)
Pt c/o increased sob, congestion, white/green mucus/sputum. Pt got a steroid shot over the weekend at ED.  Pt states that she has completed course of Augmentin today that was given 01/12/2013--has ZPAK rx on hand.  Pt states that with her stool has been "black" while taking the Augmentin--decreased stool amt---pt denies abd pain/discomfort. Would like to know if she needs to go ahead and take the ZPAK or if she needs something else stronger since shes no better. Would also like to know if she needs to be concerned of the "black" stools   Pt recently seen by TP : Patient Instructions 01/12/13     Follow med calendar closely and bring to each visit.  Augmentin 875mg  Twice daily For 7 days -take w/ food  Continue on current regimen  Follow up Dr. [name] In 3 months and As needed  Please contact office for sooner follow up if symptoms do not improve or worsen or seek emergency care   Allergies  Allergen Reactions  . Other     STEROIDS, pt staes she blows up and gains weight, oral only   Please advise Dr [name]. Thanks.

## 2013-01-21 ENCOUNTER — Encounter: Payer: Self-pay | Admitting: Internal Medicine

## 2013-01-21 ENCOUNTER — Ambulatory Visit (INDEPENDENT_AMBULATORY_CARE_PROVIDER_SITE_OTHER): Payer: Medicare Other | Admitting: Internal Medicine

## 2013-01-21 VITALS — BP 148/60 | HR 96 | Temp 98.0°F | Ht 66.0 in | Wt 187.0 lb

## 2013-01-21 DIAGNOSIS — J310 Chronic rhinitis: Secondary | ICD-10-CM

## 2013-01-21 DIAGNOSIS — J9610 Chronic respiratory failure, unspecified whether with hypoxia or hypercapnia: Secondary | ICD-10-CM

## 2013-01-21 DIAGNOSIS — J449 Chronic obstructive pulmonary disease, unspecified: Secondary | ICD-10-CM

## 2013-01-21 MED ORDER — FLUTTER DEVI: Status: DC

## 2013-01-21 MED ORDER — PREDNISONE 10 MG PO TABS: ORAL_TABLET | ORAL | Status: DC

## 2013-01-21 MED ORDER — METHYLPREDNISOLONE ACETATE 80 MG/ML IJ SUSP
120.0000 mg | Freq: Once | INTRAMUSCULAR | Status: AC
  Administered 2013-01-21: 120 mg via INTRAMUSCULAR

## 2013-01-21 NOTE — Progress Notes (Signed)
Subjective:     Patient ID: Katie Bryant, female   DOB: [birth year]    MRN: [id number]    Brief patient profile:  51 yowf quit smoking 1992 with GOLD III copd dx 2009 eval in pulm clinic p hospitalized at [place] date: 12/21/2010  Discharge date: 12/24/2010  C O P D  ALLERGIC RHINITIS  DYSPNEA  G E R D  Aortic stenosis  Insomnia  Leukocytosis        01/21/2011 f/u ov/Katie Bryant cc  baseline doe Katie Bryant off 02 and on 02 can do the whole store s stopping at 3lpm but discharged on 4lpm does not wear it 24 hours 25% of the time. Uses multiple forms of albuterol. Cough is better, sputum mucoid.  >no changes, med rec next step   02/03/11 Follow up and med review Pt returns for follow up and med review.  We reviewed all her meds and organized them into a med calendar with pt education .  She appears to be taking her meds correctly.  She has returned back to her baseline from recent PNA episode last month. CXR c/w complete clearance   rec follow med calendar   03/11/2011 f/u ov/Katie Bryant cc ok x nose bleeds ? On 02 has calendar but not using it, ad libbing on 02 but overall feels making slow progress with return to nl activity tol and really no limiting sob or cough or need for daytime 02 rec Ok to leave 02 off at rest    06/20/2011 f/u ov/Katie Bryant cc 2-3 weeks  increased cough, and sneezing > mucus turned thick yellow rx abx and steroids x 2 some better but very confused with meds/ names/ not following action plans at bottom of calendar.  Sleeping poorly with nocturnal  And  early am exacerbation  of respiratory  c/o's and  need for noct saba. rec Prednisone 10 mg take  4 each am x 2 days,   2 each am x 2 days,  1 each am x2days and stop  Add pepcid 20 mg one at bedtime (Zantac/ranitidine) 150 mg) one at bedtime GERD diet reviewed Flutter valve should help bring up mucus - use it as much as your want Keep Follow up [facility] office Tuesday June 18  Late add: Clarify 02 use for problem  list at next ov    06/24/2011 f/u ov/Katie Bryant cc breathing some better but thoroughly confused with instructions/ names of meds/ lost calendar we reviwed just 4 days  prior to [place]  - husband came with her today also confused with details of care.  Cough much looser with flutter, producing less thick white mucus only,  not sure which mucinex product she's using, hfa technique quite poor. Did not follow instructions on adding H2  at bedtime "using an acid reducer as needed"  >>budesonide Twice daily  And taper steroids to off   07/09/2011 Follow up and Med review  Returns after recent COPD flare -now resolved and back to her baseline  We reviewed all her meds and organized them into a med calendar w/ pt edcuation  Recent changes were reviewed.  rec Follow med calendar closely and bring to each visit   08/12/2011 f/u ov/Katie Bryant cc breathing about the same, minimal cough, thoroughly confused again with instructions and meds, recognizes the calendar we gave her and remembers receiving it but didn't bring it and not using it.   No unusual cough, purulent sputum or HB symptoms on present rx. Does have chronic  nasal obst symptoms esp at hs takes otc doesn't know name.  >>no changes   08/27/2011    Follow up and med calendar  We reviewed all her meds and organized them into a med calendar w/ pt edcuation  She says she got her old med calendar at last ov b/c she was in a hurry Have suggested she throw away old forms if not needed.  She appears to be taking meds correctly Says she is doing well. No flare in dyspnea or cough No fever or chest pain rec No change in rx > follow calendar  09/29/2011 f/u ov/Katie Bryant has med calendar and doing well until 2 weeks prior to OV  with worse nasal congestion and green mucus production. Had been using allegra daily x years and still having lots of watery rhinitis "all year long" refractory to flonase.   Still no increase need for rescue saba daytime rec Augmentin 875 twice daily  x 10 days  Prednisone 10 mg take  4 each am x 2 days,   2 each am x 2 days,  1 each am x2days and stop  Start Dymista one twice daily  Please see patient coordinator before you leave today  to schedule nebs refills through [facility] See calendar for specific medication instructions   10/07/2011 f/u ov/Katie Bryant did not bring calendar given just 10 days prior to [place]  But says she's following it and breathing and all resp cc's quite a bit better including her watery rhinitis.  Just completing abx, not sure she ever got her prednsone that was called in on previous ov. x - still very confused with names of meds, esp inhalers and nebs. rec Use med calendar No change rx  11/11/2011 f/u ov/Katie Bryant / Katie Bryant with med calendar in hand cc nasal congestion and "nose runs like a faucet" watery esp in ams despite prn allegra.  Still struggling with concept of medication reconciliation but doing some better and using xopenex about twice  Daily, her avg.  rec Chlortrimeton 4 mg at bedtime automatically and again  every 6 hours as needed for stuffy nose, runny nose (instead of allegra)  Take the budesonide along with your morning treatments No obvious daytime variabilty or assoc chronic cough or cp or chest tightness, subjective wheeze overt sinus or hb symptoms. No unusual exp hx   01/13/2012 f/u ov/Katie Bryant cc still confused with meds, not following prev instructions re 1st gen H1 re nasal symptoms has calendar but not using it. Variably sob room to room.  No purulent sputum overt sinus or hb symptoms. Some better with saba.   >>pred /chlor tabs   02/12/2012  Follow up and Med review     Patient returns for a one-month followup and medication review. Last visit. Patient with a COPD exacerbation, and acute rhinitis. Patient was treated with a steroid taper and advised adding chlor tabs. Patient returns today with some improvement with decreased cough, and drainage. We reviewed all her medications and organized them into a  medication calendar with patient education. It does appear that she is taking her medications correctly. However, she does have some difficulty with pronunciation of the medications. She realizes quite a bit on how the medication works and what the bottle says rec No change rx, use med calendar   06/09/2012 f/u ov/Katie Bryant re copd/02 dep  Chief Complaint  Patient presents with  . Follow-up    Pt c/o increased SOB for the past 2 days-she relates this to having nasal congestion and  not getting enough o2 through her nasal cannula. Her sats when she arrived were 66%3lpm pulsed o2---increased to 91%4lpm continuous o2.    thoroughly confused with meds despite calendar, has several written as generics not matching her med calendar. meds in wrong bag (maint vs prns even though each bag is clearly marked with daily vs as neededs. >rx dymista , changed pepcid to zantac 150mg  Twice daily   Ok to leave 02 off at rest but 3lpm at bedtime and up to 4lpm with activity   07/14/2012 Follow up  Returns for follow up and med review,  pt did not bring her meds today.   Tearful today , reports having some issues at home-husband and her not getting a long.  Feels  breathing is doing "real good" and swimming yesterday. No flare in cough or dyspnea.  No wheezing, hemoptysis, fever or edema.  Nasal drainage better with dymista  rec No change rx   10/12/2012 f/u ov/Katie Bryant re:  02 dep copd Cc doe x [employer] "for hours" s stopping Nose better with allegra Rare need for prn saba in any form >>No changes   01/12/13 followup and medication review. Patient returns for followup and medication review. We Reviewed all her medications and organized them into a medication calendar with patient education. It appears the patient is taking her medications correctly est med calendar - pt brought all meds with her today.  pt She does complains of some chest congestion, prod cough w/ thick clear mucus, increased DOE, chest tightness w/  coughing x3days.  denies f/c/s, wheezing, dyspnea at rest, nausea, vomiting rec Follow med calendar closely and bring to each visit.  Augmentin 875mg  Twice daily  For 7 days -take w/ food  Continue on current regimen    01/21/2013 post er f/u ov/Katie Bryant re copd/ acute flare with nasal congestion , better p neb and one shot steroids in ER 01/18/13  Chief Complaint  Patient presents with  . Acute Visit    Pt c/o increased SOB and cough. Her cough is prod with green to yellow sputum.   very poor insight into action plans at bottom of med calendar for specific symptoms  No obvious day to day or daytime variabilty or assoc chronic cough or cp or chest tightness, subjective wheeze overt   hb symptoms. No unusual exp hx or h/o childhood pna/ asthma or knowledge of premature birth.  Sleeping ok without nocturnal  or early am exacerbation  of respiratory  c/o's or need for noct saba. Also denies any obvious fluctuation of symptoms with weather or environmental changes or other aggravating or alleviating factors except as outlined above   Current Medications, Allergies, Complete Past Medical History, Past Surgical History, Family History, and Social History were reviewed in [employer] record.  ROS  The following are not active complaints unless bolded sore throat, dysphagia, dental problems, itching, sneezing,  nasal congestion or excess/ purulent secretions, ear ache,   fever, chills, sweats, unintended wt loss, pleuritic or exertional cp, hemoptysis,  orthopnea pnd or leg swelling, presyncope, palpitations, heartburn, abdominal pain, anorexia, nausea, vomiting, diarrhea  or change in bowel or urinary habits, change in stools or urine, dysuria,hematuria,  rash, arthralgias, visual complaints, headache, numbness weakness or ataxia or problems with walking or coordination,  change in mood/affect or memory.                Objective:  Physical Exam  anxious  amb wf nad speaking if  full sentences  Wt 184 01/21/2011  >08/27/2011 186 > 180 09/29/2011 > 10/07/2011  183 > 11/11/2011  181 > 01/13/2012  183 >180 02/12/2012 > 06/09/2012 188 > 189 10/12/2012 >188 01/14/2013 > 01/21/2013 187   HEENT mod non-specific turbinate edema.  Oropharynx no thrush or excess pnd or cobblestoning.  No JVD or cervical adenopathy. Mild accessory muscle hypertrophy. Trachea midline, nl thryroid. Chest was hyperinflated by percussion with diminished breath sounds and trace bilateral exp wheezing. Regular rate and rhythm without murmur gallop or rub or increase P2 or edema.  Abd: no hsm, nl excursion. Ext warm without cyanosis or clubbing.        CXR 10/07/12 Cardiomegaly without edema. Chronic changes.    Assessment:

## 2013-01-21 NOTE — Patient Instructions (Addendum)
Change the chlorpheniramine (clor tabs) to take one every 4 hours as needed for drippy nose  For stuffy nose ok to use sudafed otc  For cough > delsym 2tsp every 12 hours and use flutter valve as much as you can   Prednisone 10 mg take  4 each am x 2 days,   2 each am x 2 days,  1 each am x 2 days and stop   If not improving the next step is sinus CT - call [facility] at [phone] to schedule  Please schedule a follow up office visit in 6 weeks, call sooner if needed

## 2013-01-22 NOTE — Assessment & Plan Note (Addendum)
-   HFA 75% effective p coaching 06/20/2011 > 50% 06/24/2011  -med calendar 02/03/11  > not using correctly 06/20/11 > not using at all 06/24/2011 , 07/08/2011, 08/12/2011 , 08/27/2011  > using it 09/29/2011 > not using correctly 01/13/12 -med calendar redo 02/12/2012 , 01/12/13   DDX of  difficult airways managment all start with A and  include Adherence, Ace Inhibitors, Acid Reflux, Active Sinus Disease, Alpha 1 Antitripsin deficiency, Anxiety masquerading as Airways dz,  ABPA,  allergy(esp in young), Aspiration (esp in elderly), Adverse effects of DPI,  Active smokers, plus two Bs  = Bronchiectasis and Beta blocker use..and one C= CHF  Adherence is always the initial "prime suspect" and is a multilayered concern that requires a "trust but verify" approach in every patient - starting with knowing how to use medications, especially inhalers, correctly, keeping up with refills and understanding the fundamental difference between maintenance and prns vs those medications only taken for a very short course and then stopped and not refilled.  - The proper method of use, as well as anticipated side effects, of a metered-dose inhaler are discussed and demonstrated to the patient. Improved effectiveness after extensive coaching during this visit to a level of approximately  75%  -   Each maintenance medication was reviewed in detail including most importantly the difference between maintenance and as needed and under what circumstances the prns are to be used. This was done in the context of a medication calendar review which provided the patient with a user-friendly unambiguous mechanism for medication administration and reconciliation and provides an action plan for all active problems. It is critical that this be shown to every doctor  for modification during the office visit if necessary so the patient can use it as a working document.      ? Active sinus dz s/p augmentin rx > sinus ct next step  ? Anxiety causing more  air trapping > rx reviewed  ? Allergy > Prednisone 10 mg take  4 each am x 2 days,   2 each am x 2 days,  1 each am x 2 days and stop

## 2013-01-22 NOTE — Assessment & Plan Note (Addendum)
-   sats 90% RA 03/11/2011     - Dr Annamaria Boots started 02 around 2010    - 3 lpm at hs, 3lpm with activity, as needed at rest  Adequate control on present rx, reviewed > no change in rx needed

## 2013-01-22 NOTE — Assessment & Plan Note (Signed)
rec change rx to use 1st gen h1 for watery rhinitis and sudafed for nasal congestion

## 2013-01-24 ENCOUNTER — Telehealth: Payer: Self-pay | Admitting: Family Medicine

## 2013-01-24 ENCOUNTER — Other Ambulatory Visit: Payer: Self-pay | Admitting: Family Medicine

## 2013-01-24 MED ORDER — ALPRAZOLAM 1 MG PO TABS: 1.0000 mg | ORAL_TABLET | Freq: Two times a day (BID) | ORAL | Status: DC | PRN

## 2013-01-24 NOTE — Telephone Encounter (Signed)
RX called in per FPW

## 2013-01-24 NOTE — Telephone Encounter (Signed)
Rx ready for nurse to Phone in. 

## 2013-01-26 ENCOUNTER — Other Ambulatory Visit: Payer: Self-pay

## 2013-01-26 MED ORDER — FENOFIBRATE MICRONIZED 134 MG PO CAPS: 134.0000 mg | ORAL_CAPSULE | Freq: Every day | ORAL | Status: DC

## 2013-01-27 ENCOUNTER — Other Ambulatory Visit: Payer: Self-pay | Admitting: Internal Medicine

## 2013-01-27 NOTE — Telephone Encounter (Signed)
Pt Aware rx had been called in to [facility]

## 2013-02-08 ENCOUNTER — Encounter: Payer: Self-pay | Admitting: Family Medicine

## 2013-02-08 ENCOUNTER — Ambulatory Visit (INDEPENDENT_AMBULATORY_CARE_PROVIDER_SITE_OTHER): Payer: Medicare Other | Admitting: Family Medicine

## 2013-02-08 VITALS — BP 152/69 | HR 76 | Temp 98.2°F | Ht 66.0 in | Wt 184.8 lb

## 2013-02-08 DIAGNOSIS — K219 Gastro-esophageal reflux disease without esophagitis: Secondary | ICD-10-CM

## 2013-02-08 DIAGNOSIS — J449 Chronic obstructive pulmonary disease, unspecified: Secondary | ICD-10-CM

## 2013-02-08 DIAGNOSIS — J9610 Chronic respiratory failure, unspecified whether with hypoxia or hypercapnia: Secondary | ICD-10-CM

## 2013-02-08 DIAGNOSIS — J4489 Other specified chronic obstructive pulmonary disease: Secondary | ICD-10-CM

## 2013-02-08 DIAGNOSIS — G4700 Insomnia, unspecified: Secondary | ICD-10-CM

## 2013-02-08 DIAGNOSIS — E785 Hyperlipidemia, unspecified: Secondary | ICD-10-CM

## 2013-02-08 DIAGNOSIS — I10 Essential (primary) hypertension: Secondary | ICD-10-CM

## 2013-02-08 DIAGNOSIS — I359 Nonrheumatic aortic valve disorder, unspecified: Secondary | ICD-10-CM

## 2013-02-08 DIAGNOSIS — I350 Nonrheumatic aortic (valve) stenosis: Secondary | ICD-10-CM

## 2013-02-08 DIAGNOSIS — E669 Obesity, unspecified: Secondary | ICD-10-CM

## 2013-02-08 NOTE — Progress Notes (Signed)
Patient ID: Katie Bryant, female   DOB: Apr 02, 1928, 80 y.o.   MRN: [id number] SUBJECTIVE: CC: Chief Complaint  Patient presents with  . Follow-up    HPI:   Patient is here for follow up of hypertension/COPD/GERD/: denies Headache;deniesChest Pain;denies weakness;denies Shortness of Breath or Orthopnea;denies Visual changes;denies palpitations;denies cough;denies pedal edema;denies symptoms of TIA or stroke; admits to Compliance with medications. denies Problems with medications.  Past Medical History  Diagnosis Date  . COPD (chronic obstructive pulmonary disease)   . Oxygen dependent   . Aortic stenosis, mild   . Obesity   . Hypertension   . Anxiety   . Back pain   . Headache(784.0)   . Breast cancer   . Asthma   . Sleep apnea   . DEMENTIA   . GERD (gastroesophageal reflux disease)   . Arthritis   . Pneumonia   . H/O hiatal hernia   . Depression   . Lymphadenopathy 10/07/2012   Past Surgical History  Procedure Laterality Date  . Cardiac catheterization      EF 55-60%  . Tonsillectomy and adenoidectomy  1942  . Breast reconstruction    . Appendectomy    . Mastectomy  1980   History   Social History  . Marital Status: Married    Spouse Name: N/A    Number of Children: N/A  . Years of Education: N/A   Occupational History  . Not on file.   Social History Main Topics  . Smoking status: Former Smoker -- 1.00 packs/day for 40 years    Types: Cigarettes    Quit date: 02/11/1990  . Smokeless tobacco: Never Used  . Alcohol Use: 4.2 oz/week    7 Glasses of wine per week  . Drug Use: No  . Sexual Activity: Not Currently   Other Topics Concern  . Not on file   Social History Narrative  . No narrative on file   Family History  Problem Relation Age of Onset  . Tuberculosis Mother   . Lung cancer Father    Current Outpatient Prescriptions on File Prior to Visit  Medication Sig Dispense Refill  . acetaminophen (TYLENOL) 500 MG tablet Take 500 mg by  mouth every 6 (six) hours as needed.      Marland Kitchen albuterol (PROVENTIL HFA;VENTOLIN HFA) 108 (90 BASE) MCG/ACT inhaler Inhale 2 puffs into the lungs every 4 (four) hours as needed for wheezing or shortness of breath.      . ALPRAZolam (XANAX) 1 MG tablet Take 1 tablet (1 mg total) by mouth 2 (two) times daily as needed for anxiety.  60 tablet  0  . aspirin 81 MG tablet Take 81 mg by mouth daily.      Marland Kitchen atorvastatin (LIPITOR) 40 MG tablet Take 40 mg by mouth daily.      . budesonide (PULMICORT) 0.5 MG/2ML nebulizer solution Take 0.5 mg by nebulization 2 (two) times daily.      . clobetasol cream (TEMOVATE) 2.37 % Apply 1 application topically 2 (two) times daily.      Marland Kitchen estradiol (ESTRACE) 0.1 MG/GM vaginal cream Place 1 Applicatorful vaginally as needed (use as directed).      . fenofibrate micronized (LOFIBRA) 134 MG capsule Take 1 capsule (134 mg total) by mouth daily before breakfast.  90 capsule  0  . ipratropium-albuterol (DUONEB) 0.5-2.5 (3) MG/3ML SOLN Take 3 mLs by nebulization 4 (four) times daily.  360 mL  11  . metoprolol tartrate (LOPRESSOR) 25 MG tablet Take 0.5 tablets (  12.5 mg total) by mouth 2 (two) times daily.  90 tablet  4  . OXYGEN-HELIUM IN Wear 3 lpm continuously and increase to to 4 lpm as needed for shortness of breath w/ activity      . oxymetazoline (AFRIN) 0.05 % nasal spray Place 1 spray into both nostrils 2 (two) times daily as needed for congestion.      . ranitidine (ZANTAC) 150 MG tablet Take 150 mg by mouth as needed.       Marland Kitchen Respiratory Therapy Supplies (FLUTTER) DEVI Use as directed  1 each  0  . Respiratory Therapy Supplies (FLUTTER) DEVI Use as directed  1 each  0  . Simethicone (GAS-X PO) as needed.      . valsartan-hydrochlorothiazide (DIOVAN-HCT) 80-12.5 MG per tablet Take 1 tablet by mouth daily.      . VENTOLIN HFA 108 (90 BASE) MCG/ACT inhaler INHALE TWO PUFFS BY MOUTH EVERY 6 HOURS AS NEEDED  18 each  2  . chlorpheniramine (CHLOR-TRIMETON) 4 MG tablet 2 tabs  by mouth at bedtime.  May add 1 extra every 6 hours as needed for drippy nose      . Dextromethorphan Polistirex (COUGH DM PO) Per bottle directions as needed      . dimenhyDRINATE (DRAMAMINE) 50 MG tablet Take 50 mg by mouth every 8 (eight) hours as needed for dizziness.      . diphenhydrAMINE (BENADRYL) 25 MG tablet Take 25 mg by mouth every 6 (six) hours as needed for allergies.      . phenylephrine (SUDAFED PE) 10 MG TABS tablet Take 10 mg by mouth every 6 (six) hours as needed (cough/ congestion).      . predniSONE (DELTASONE) 10 MG tablet Take  4 each am x 2 days,   2 each am x 2 days,  1 each am x 2 days and stop  14 tablet  0   No current facility-administered medications on file prior to visit.   Allergies  Allergen Reactions  . Other     STEROIDS, pt staes she blows up and gains weight, oral only   Immunization History  Administered Date(s) Administered  . Influenza Split 10/07/2010, 09/19/2012, 09/28/2012  . Influenza Whole 10/07/2011  . Pneumococcal Polysaccharide-23 01/06/2009   Prior to Admission medications   Medication Sig Start Date End Date Taking? Authorizing Provider  acetaminophen (TYLENOL) 500 MG tablet Take 500 mg by mouth every 6 (six) hours as needed.    Historical Provider, MD  albuterol (PROVENTIL HFA;VENTOLIN HFA) 108 (90 BASE) MCG/ACT inhaler Inhale 2 puffs into the lungs every 4 (four) hours as needed for wheezing or shortness of breath.    Historical Provider, MD  ALPRAZolam Duanne Moron) 1 MG tablet Take 1 tablet (1 mg total) by mouth 2 (two) times daily as needed for anxiety. 01/24/13   Vernie Shanks, MD  aspirin 81 MG tablet Take 81 mg by mouth daily.    Historical Provider, MD  atorvastatin (LIPITOR) 40 MG tablet Take 40 mg by mouth daily.    Historical Provider, MD  budesonide (PULMICORT) 0.5 MG/2ML nebulizer solution Take 0.5 mg by nebulization 2 (two) times daily.    Historical Provider, MD  chlorpheniramine (CHLOR-TRIMETON) 4 MG tablet 2 tabs by mouth at  bedtime.  May add 1 extra every 6 hours as needed for drippy nose    Historical Provider, MD  clobetasol cream (TEMOVATE) 2.54 % Apply 1 application topically 2 (two) times daily.    Historical Provider, MD  Dextromethorphan  Polistirex (COUGH DM PO) Per bottle directions as needed    Historical Provider, MD  dimenhyDRINATE (DRAMAMINE) 50 MG tablet Take 50 mg by mouth every 8 (eight) hours as needed for dizziness.    Historical Provider, MD  diphenhydrAMINE (BENADRYL) 25 MG tablet Take 25 mg by mouth every 6 (six) hours as needed for allergies.    Historical Provider, MD  estradiol (ESTRACE) 0.1 MG/GM vaginal cream Place 1 Applicatorful vaginally as needed (use as directed).    Historical Provider, MD  fenofibrate micronized (LOFIBRA) 134 MG capsule Take 1 capsule (134 mg total) by mouth daily before breakfast. 01/26/13   Minus Breeding, MD  ipratropium-albuterol (DUONEB) 0.5-2.5 (3) MG/3ML SOLN Take 3 mLs by nebulization 4 (four) times daily. 10/07/12   Tanda Rockers, MD  metoprolol tartrate (LOPRESSOR) 25 MG tablet Take 0.5 tablets (12.5 mg total) by mouth 2 (two) times daily. 02/27/12   Thayer Headings, MD  OXYGEN-HELIUM IN Wear 3 lpm continuously and increase to to 4 lpm as needed for shortness of breath w/ activity    Historical Provider, MD  oxymetazoline (AFRIN) 0.05 % nasal spray Place 1 spray into both nostrils 2 (two) times daily as needed for congestion.    Historical Provider, MD  phenylephrine (SUDAFED PE) 10 MG TABS tablet Take 10 mg by mouth every 6 (six) hours as needed (cough/ congestion).    Historical Provider, MD  predniSONE (DELTASONE) 10 MG tablet Take  4 each am x 2 days,   2 each am x 2 days,  1 each am x 2 days and stop 01/21/13   Tanda Rockers, MD  ranitidine (ZANTAC) 150 MG tablet Take 150 mg by mouth as needed.     Historical Provider, MD  Respiratory Therapy Supplies (FLUTTER) DEVI Use as directed 06/20/11   Tanda Rockers, MD  Respiratory Therapy Supplies (FLUTTER) DEVI Use  as directed 01/21/13   Tanda Rockers, MD  Simethicone (GAS-X PO) as needed.    Historical Provider, MD  valsartan-hydrochlorothiazide (DIOVAN-HCT) 80-12.5 MG per tablet Take 1 tablet by mouth daily.    Historical Provider, MD  VENTOLIN HFA 108 (90 BASE) MCG/ACT inhaler INHALE TWO PUFFS BY MOUTH EVERY 6 HOURS AS NEEDED 01/27/13   Tanda Rockers, MD     ROS: As above in the HPI. All other systems are stable or negative.  OBJECTIVE: APPEARANCE:  Patient in no acute distress.The patient appeared well nourished and normally developed. Acyanotic. Waist: VITAL SIGNS:BP 152/69  Pulse 76  Temp(Src) 98.2 F (36.8 C) (Oral)  Ht _0  (1.676 m)  Wt 184 lb 12.8 oz (83.825 kg)  BMI 29.84 kg/m2  SpO2 90% WF On O2 via [place].   SKIN: warm and  Dry without overt rashes, tattoos and scars  HEAD and Neck: without JVD, Head and scalp: normal Eyes:No scleral icterus. Fundi normal, eye movements normal. Ears: Auricle normal, canal normal, Tympanic membranes normal, insufflation normal. Nose: normal Throat: normal Neck & thyroid: normal  CHEST & LUNGS: Chest wall: normal Lungs: Coarse bs.  CVS: Reveals the PMI to be normally located. Regular rhythm, First and Second Heart sounds are normal,  absence of murmurs, rubs or gallops. Peripheral vasculature: Radial pulses: normal Dorsal pedis pulses: normal Posterior pulses: normal  ABDOMEN:  Appearance: Obese Benign, no organomegaly, no masses, no Abdominal Aortic enlargement. No Guarding , no rebound. No Bruits. Bowel sounds: normal  RECTAL: N/A GU: N/A  EXTREMETIES: nonedematous.  NEUROLOGIC: oriented to time,place and person; nonfocal.  ASSESSMENT:  G E R D  Insomnia  Aortic stenosis  COPD GOLD III  HTN (hypertension) - Plan: CMP14+EGFR  Hyperlipidemia - Plan: CMP14+EGFR, NMR, lipoprofile  OBESITY  Chronic respiratory failure Stable so far   PLAN: No change in medications.  Await labs.  Orders Placed This  Encounter  Procedures  . CMP14+EGFR  . NMR, lipoprofile   Meds ordered this encounter  Medications  . terbinafine (LAMISIL) 250 MG tablet    Sig:    There are no discontinued medications. Return in about 3 months (around 05/08/2013) for Recheck medical problems.  Darci Lykins P. Jacelyn Grip, M.D.

## 2013-02-10 LAB — CMP14+EGFR
ALT: 10 IU/L (ref 0–32)
AST: 14 IU/L (ref 0–40)
Albumin/Globulin Ratio: 1.8 (ref 1.1–2.5)
Albumin: 4.2 g/dL (ref 3.5–4.7)
Alkaline Phosphatase: 56 IU/L (ref 39–117)
BUN/Creatinine Ratio: 18 (ref 11–26)
BUN: 14 mg/dL (ref 8–27)
CO2: 31 mmol/L — ABNORMAL HIGH (ref 18–29)
Calcium: 9.9 mg/dL (ref 8.7–10.3)
Chloride: 99 mmol/L (ref 97–108)
Creatinine, Ser: 0.8 mg/dL (ref 0.57–1.00)
GFR calc Af Amer: 79 mL/min/{1.73_m2} (ref >59)
GFR calc non Af Amer: 69 mL/min/{1.73_m2} (ref >59)
Globulin, Total: 2.4 g/dL (ref 1.5–4.5)
Glucose: 101 mg/dL — ABNORMAL HIGH (ref 65–99)
Potassium: 4.5 mmol/L (ref 3.5–5.2)
Sodium: 145 mmol/L — ABNORMAL HIGH (ref 134–144)
Total Bilirubin: 0.2 mg/dL (ref 0.0–1.2)
Total Protein: 6.6 g/dL (ref 6.0–8.5)

## 2013-02-10 LAB — NMR, LIPOPROFILE
Cholesterol: 179 mg/dL (ref <200)
HDL Cholesterol by NMR: 67 mg/dL (ref >=40)
HDL Particle Number: 44.6 umol/L (ref >=30.5)
LDL Particle Number: 1316 nmol/L — ABNORMAL HIGH (ref <1000)
LDL Size: 21 nm (ref >20.5)
LDLC SERPL CALC-MCNC: 74 mg/dL (ref <100)
LP-IR Score: 50 — ABNORMAL HIGH (ref <=45)
Small LDL Particle Number: 642 nmol/L — ABNORMAL HIGH (ref <=527)
Triglycerides by NMR: 188 mg/dL — ABNORMAL HIGH (ref <150)

## 2013-02-25 ENCOUNTER — Inpatient Hospital Stay (HOSPITAL_COMMUNITY)
Admission: EM | Admit: 2013-02-25 | Discharge: 2013-02-26 | DRG: 193 | Disposition: A | Discharge status: Home / Self Care | Payer: Medicare Other | Attending: Internal Medicine | Admitting: Internal Medicine

## 2013-02-25 ENCOUNTER — Encounter (HOSPITAL_COMMUNITY): Payer: Self-pay | Admitting: Emergency Medicine

## 2013-02-25 ENCOUNTER — Emergency Department (HOSPITAL_COMMUNITY): Payer: Medicare Other

## 2013-02-25 ENCOUNTER — Encounter: Payer: Self-pay | Admitting: Nurse Practitioner

## 2013-02-25 ENCOUNTER — Ambulatory Visit (INDEPENDENT_AMBULATORY_CARE_PROVIDER_SITE_OTHER): Payer: Medicare Other | Admitting: Nurse Practitioner

## 2013-02-25 VITALS — BP 188/81 | HR 124 | Temp 98.3°F

## 2013-02-25 DIAGNOSIS — F411 Generalized anxiety disorder: Secondary | ICD-10-CM | POA: Diagnosis present

## 2013-02-25 DIAGNOSIS — F3289 Other specified depressive episodes: Secondary | ICD-10-CM | POA: Diagnosis present

## 2013-02-25 DIAGNOSIS — F0390 Unspecified dementia without behavioral disturbance: Secondary | ICD-10-CM | POA: Diagnosis present

## 2013-02-25 DIAGNOSIS — Z801 Family history of malignant neoplasm of trachea, bronchus and lung: Secondary | ICD-10-CM

## 2013-02-25 DIAGNOSIS — J209 Acute bronchitis, unspecified: Secondary | ICD-10-CM | POA: Diagnosis present

## 2013-02-25 DIAGNOSIS — I10 Essential (primary) hypertension: Secondary | ICD-10-CM

## 2013-02-25 DIAGNOSIS — F329 Major depressive disorder, single episode, unspecified: Secondary | ICD-10-CM | POA: Diagnosis present

## 2013-02-25 DIAGNOSIS — R0989 Other specified symptoms and signs involving the circulatory and respiratory systems: Secondary | ICD-10-CM

## 2013-02-25 DIAGNOSIS — Z9010 Acquired absence of unspecified breast and nipple: Secondary | ICD-10-CM

## 2013-02-25 DIAGNOSIS — Z853 Personal history of malignant neoplasm of breast: Secondary | ICD-10-CM

## 2013-02-25 DIAGNOSIS — I350 Nonrheumatic aortic (valve) stenosis: Secondary | ICD-10-CM

## 2013-02-25 DIAGNOSIS — G4730 Sleep apnea, unspecified: Secondary | ICD-10-CM | POA: Diagnosis present

## 2013-02-25 DIAGNOSIS — R0609 Other forms of dyspnea: Secondary | ICD-10-CM

## 2013-02-25 DIAGNOSIS — K219 Gastro-esophageal reflux disease without esophagitis: Secondary | ICD-10-CM

## 2013-02-25 DIAGNOSIS — J45901 Unspecified asthma with (acute) exacerbation: Secondary | ICD-10-CM

## 2013-02-25 DIAGNOSIS — Z6831 Body mass index (BMI) 31.0-31.9, adult: Secondary | ICD-10-CM

## 2013-02-25 DIAGNOSIS — E785 Hyperlipidemia, unspecified: Secondary | ICD-10-CM

## 2013-02-25 DIAGNOSIS — J441 Chronic obstructive pulmonary disease with (acute) exacerbation: Secondary | ICD-10-CM

## 2013-02-25 DIAGNOSIS — J4489 Other specified chronic obstructive pulmonary disease: Secondary | ICD-10-CM

## 2013-02-25 DIAGNOSIS — R000 Tachycardia, unspecified: Secondary | ICD-10-CM

## 2013-02-25 DIAGNOSIS — K449 Diaphragmatic hernia without obstruction or gangrene: Secondary | ICD-10-CM | POA: Diagnosis present

## 2013-02-25 DIAGNOSIS — Z87891 Personal history of nicotine dependence: Secondary | ICD-10-CM

## 2013-02-25 DIAGNOSIS — I359 Nonrheumatic aortic valve disorder, unspecified: Secondary | ICD-10-CM | POA: Diagnosis present

## 2013-02-25 DIAGNOSIS — J9620 Acute and chronic respiratory failure, unspecified whether with hypoxia or hypercapnia: Secondary | ICD-10-CM | POA: Diagnosis present

## 2013-02-25 DIAGNOSIS — J440 Chronic obstructive pulmonary disease with acute lower respiratory infection: Secondary | ICD-10-CM | POA: Diagnosis present

## 2013-02-25 DIAGNOSIS — Z7982 Long term (current) use of aspirin: Secondary | ICD-10-CM

## 2013-02-25 DIAGNOSIS — E669 Obesity, unspecified: Secondary | ICD-10-CM | POA: Diagnosis present

## 2013-02-25 DIAGNOSIS — J9610 Chronic respiratory failure, unspecified whether with hypoxia or hypercapnia: Secondary | ICD-10-CM

## 2013-02-25 DIAGNOSIS — R0603 Acute respiratory distress: Secondary | ICD-10-CM

## 2013-02-25 DIAGNOSIS — J9612 Chronic respiratory failure with hypercapnia: Secondary | ICD-10-CM | POA: Diagnosis present

## 2013-02-25 DIAGNOSIS — J449 Chronic obstructive pulmonary disease, unspecified: Secondary | ICD-10-CM

## 2013-02-25 DIAGNOSIS — J189 Pneumonia, unspecified organism: Principal | ICD-10-CM

## 2013-02-25 DIAGNOSIS — Z9981 Dependence on supplemental oxygen: Secondary | ICD-10-CM

## 2013-02-25 LAB — CBC
HEMATOCRIT: 37.2 % (ref 36.0–46.0)
HEMATOCRIT: 36.1 % (ref 36.0–46.0)
Hemoglobin: 11.5 g/dL — ABNORMAL LOW (ref 12.0–15.0)
Hemoglobin: 11.1 g/dL — ABNORMAL LOW (ref 12.0–15.0)
MCH: 29.6 pg (ref 26.0–34.0)
MCH: 29.4 pg (ref 26.0–34.0)
MCHC: 30.9 g/dL (ref 30.0–36.0)
MCHC: 30.7 g/dL (ref 30.0–36.0)
MCV: 95.9 fL (ref 78.0–100.0)
MCV: 95.5 fL (ref 78.0–100.0)
PLATELETS: 196 10*3/uL (ref 150–400)
Platelets: 198 10*3/uL (ref 150–400)
RBC: 3.88 MIL/uL (ref 3.87–5.11)
RBC: 3.78 MIL/uL — ABNORMAL LOW (ref 3.87–5.11)
RDW: 12.8 % (ref 11.5–15.5)
RDW: 12.8 % (ref 11.5–15.5)
WBC: 15.3 10*3/uL — AB (ref 4.0–10.5)
WBC: 15.4 10*3/uL — AB (ref 4.0–10.5)

## 2013-02-25 LAB — BASIC METABOLIC PANEL
BUN: 16 mg/dL (ref 6–23)
CALCIUM: 9.8 mg/dL (ref 8.4–10.5)
CHLORIDE: 96 meq/L (ref 96–112)
CO2: 36 mEq/L — ABNORMAL HIGH (ref 19–32)
CREATININE: 0.72 mg/dL (ref 0.50–1.10)
GFR, EST NON AFRICAN AMERICAN: 78 mL/min — AB (ref >90)
Glucose, Bld: 179 mg/dL — ABNORMAL HIGH (ref 70–99)
Potassium: 3.5 mEq/L — ABNORMAL LOW (ref 3.7–5.3)
Sodium: 142 mEq/L (ref 137–147)

## 2013-02-25 LAB — TROPONIN I
Troponin I: <0.3 ng/mL (ref <0.30)
Troponin I: <0.3 ng/mL (ref <0.30)

## 2013-02-25 MED ORDER — DEXTROSE 5 % IV SOLN
1.0000 g | Freq: Once | INTRAVENOUS | Status: AC
  Administered 2013-02-25: 1 g via INTRAVENOUS
  Filled 2013-02-25: qty 10

## 2013-02-25 MED ORDER — DEXTROSE 5 % IV SOLN
1.0000 g | INTRAVENOUS | Status: DC
  Filled 2013-02-25: qty 10

## 2013-02-25 MED ORDER — ASPIRIN 81 MG PO CHEW
81.0000 mg | CHEWABLE_TABLET | Freq: Every day | ORAL | Status: DC
  Filled 2013-02-25 (×2): qty 1

## 2013-02-25 MED ORDER — POTASSIUM CHLORIDE CRYS ER 20 MEQ PO TBCR
40.0000 meq | EXTENDED_RELEASE_TABLET | Freq: Once | ORAL | Status: AC
  Administered 2013-02-25: 40 meq via ORAL
  Filled 2013-02-25: qty 2

## 2013-02-25 MED ORDER — LEVALBUTEROL HCL 0.63 MG/3ML IN NEBU
0.6300 mg | INHALATION_SOLUTION | RESPIRATORY_TRACT | Status: DC | PRN
  Administered 2013-02-26: 0.63 mg via RESPIRATORY_TRACT
  Filled 2013-02-25: qty 3

## 2013-02-25 MED ORDER — FENOFIBRATE 160 MG PO TABS
160.0000 mg | ORAL_TABLET | Freq: Every day | ORAL | Status: DC
  Filled 2013-02-25: qty 1

## 2013-02-25 MED ORDER — VALSARTAN-HYDROCHLOROTHIAZIDE 80-12.5 MG PO TABS: 1.0000 | ORAL_TABLET | Freq: Every day | ORAL | Status: DC

## 2013-02-25 MED ORDER — BENZONATATE 100 MG PO CAPS
100.0000 mg | ORAL_CAPSULE | Freq: Three times a day (TID) | ORAL | Status: DC | PRN
  Administered 2013-02-25: 100 mg via ORAL
  Filled 2013-02-25 (×2): qty 1

## 2013-02-25 MED ORDER — ACETAMINOPHEN 325 MG PO TABS
650.0000 mg | ORAL_TABLET | Freq: Once | ORAL | Status: AC
  Administered 2013-02-25: 650 mg via ORAL
  Filled 2013-02-25: qty 2

## 2013-02-25 MED ORDER — AZITHROMYCIN 500 MG PO TABS
500.0000 mg | ORAL_TABLET | ORAL | Status: DC
  Filled 2013-02-25: qty 1

## 2013-02-25 MED ORDER — FAMOTIDINE 20 MG PO TABS
20.0000 mg | ORAL_TABLET | Freq: Two times a day (BID) | ORAL | Status: DC
  Administered 2013-02-25: 20 mg via ORAL
  Filled 2013-02-25 (×3): qty 1

## 2013-02-25 MED ORDER — IRBESARTAN 75 MG PO TABS
75.0000 mg | ORAL_TABLET | Freq: Every day | ORAL | Status: DC
  Filled 2013-02-25: qty 1

## 2013-02-25 MED ORDER — DIPHENHYDRAMINE HCL 25 MG PO CAPS: 25.0000 mg | ORAL_CAPSULE | Freq: Four times a day (QID) | ORAL | Status: DC | PRN

## 2013-02-25 MED ORDER — SODIUM CHLORIDE 0.9 % IV SOLN
INTRAVENOUS | Status: AC
  Administered 2013-02-26: 04:00:00 via INTRAVENOUS

## 2013-02-25 MED ORDER — DEXTROSE 5 % IV SOLN
500.0000 mg | Freq: Once | INTRAVENOUS | Status: DC
  Filled 2013-02-25: qty 500

## 2013-02-25 MED ORDER — ALBUTEROL SULFATE (2.5 MG/3ML) 0.083% IN NEBU
5.0000 mg | INHALATION_SOLUTION | Freq: Once | RESPIRATORY_TRACT | Status: AC
  Administered 2013-02-25: 5 mg via RESPIRATORY_TRACT
  Filled 2013-02-25: qty 6

## 2013-02-25 MED ORDER — HYDROCHLOROTHIAZIDE 12.5 MG PO CAPS
12.5000 mg | ORAL_CAPSULE | Freq: Every day | ORAL | Status: DC
  Filled 2013-02-25: qty 1

## 2013-02-25 MED ORDER — ESTRADIOL 0.1 MG/GM VA CREA: 1.0000 | TOPICAL_CREAM | VAGINAL | Status: DC | PRN

## 2013-02-25 MED ORDER — OXYMETAZOLINE HCL 0.05 % NA SOLN
1.0000 | Freq: Two times a day (BID) | NASAL | Status: DC | PRN
  Administered 2013-02-25: 1 via NASAL
  Filled 2013-02-25: qty 15

## 2013-02-25 MED ORDER — CLOBETASOL PROPIONATE 0.05 % EX CREA
1.0000 "application " | TOPICAL_CREAM | Freq: Two times a day (BID) | CUTANEOUS | Status: DC
  Administered 2013-02-25 – 2013-02-26 (×2): 1 via TOPICAL
  Filled 2013-02-25: qty 15

## 2013-02-25 MED ORDER — ACETAMINOPHEN 500 MG PO TABS
500.0000 mg | ORAL_TABLET | Freq: Four times a day (QID) | ORAL | Status: DC | PRN
  Administered 2013-02-25 – 2013-02-26 (×2): 500 mg via ORAL
  Filled 2013-02-25 (×2): qty 1

## 2013-02-25 MED ORDER — SIMETHICONE 80 MG PO CHEW
80.0000 mg | CHEWABLE_TABLET | Freq: Four times a day (QID) | ORAL | Status: DC | PRN
  Filled 2013-02-25: qty 1

## 2013-02-25 MED ORDER — ONDANSETRON HCL 4 MG/2ML IJ SOLN: 4.0000 mg | Freq: Four times a day (QID) | INTRAMUSCULAR | Status: DC | PRN

## 2013-02-25 MED ORDER — ATORVASTATIN CALCIUM 40 MG PO TABS
40.0000 mg | ORAL_TABLET | Freq: Every day | ORAL | Status: DC
  Filled 2013-02-25: qty 1

## 2013-02-25 MED ORDER — ALPRAZOLAM 1 MG PO TABS
1.0000 mg | ORAL_TABLET | Freq: Two times a day (BID) | ORAL | Status: DC | PRN
  Administered 2013-02-25: 1 mg via ORAL
  Filled 2013-02-25 (×2): qty 1

## 2013-02-25 MED ORDER — BUDESONIDE 0.5 MG/2ML IN SUSP
0.5000 mg | Freq: Two times a day (BID) | RESPIRATORY_TRACT | Status: DC
  Administered 2013-02-25 – 2013-02-26 (×2): 0.5 mg via RESPIRATORY_TRACT
  Filled 2013-02-25 (×5): qty 2

## 2013-02-25 MED ORDER — DM-GUAIFENESIN ER 30-600 MG PO TB12
1.0000 | ORAL_TABLET | Freq: Two times a day (BID) | ORAL | Status: DC
  Filled 2013-02-25: qty 1

## 2013-02-25 MED ORDER — ENOXAPARIN SODIUM 40 MG/0.4ML ~~LOC~~ SOLN
40.0000 mg | SUBCUTANEOUS | Status: DC
Start: 2013-02-25 — End: 2013-02-26
  Administered 2013-02-25: 40 mg via SUBCUTANEOUS
  Filled 2013-02-25 (×2): qty 0.4

## 2013-02-25 MED ORDER — METOPROLOL TARTRATE 12.5 MG HALF TABLET
12.5000 mg | ORAL_TABLET | Freq: Two times a day (BID) | ORAL | Status: DC
  Administered 2013-02-25: 12.5 mg via ORAL
  Filled 2013-02-25 (×3): qty 1

## 2013-02-25 MED ORDER — METHYLPREDNISOLONE SODIUM SUCC 40 MG IJ SOLR
40.0000 mg | Freq: Two times a day (BID) | INTRAMUSCULAR | Status: DC
  Administered 2013-02-25 – 2013-02-26 (×2): 40 mg via INTRAVENOUS
  Filled 2013-02-25 (×4): qty 1

## 2013-02-25 MED ORDER — IPRATROPIUM-ALBUTEROL 0.5-2.5 (3) MG/3ML IN SOLN
3.0000 mL | Freq: Four times a day (QID) | RESPIRATORY_TRACT | Status: DC
  Administered 2013-02-26 (×3): 3 mL via RESPIRATORY_TRACT
  Filled 2013-02-25 (×3): qty 3

## 2013-02-25 NOTE — ED Provider Notes (Signed)
CSN: [id number]     Arrival date & time 02/25/13  1718 History   First MD Initiated Contact with Patient 02/25/13 1726     Chief Complaint  Patient presents with  . Shortness of Breath     (Consider location/radiation/quality/duration/timing/severity/associated sxs/prior Treatment) HPI Comments: Patient with a history of COPD presents with worsening shortness of breath. She states that the last 5 days she's had worsening cough which is now productive of yellow-green sputum. She denies any chest pain. She's had worsening shortness of breath over the last 2-3 days with increased wheezing. She was seen by her primary care physician today and was reported to have initial oxygen saturation 53%. She was placed on oxygen EMS gave her a DuoNeb and 125 mg of Solu-Medrol. On arrival to the ED, a she's feeling much better. She states her breathing is better but she still short of breath. Her oxygen saturation is 95% on nasal cannula 4 L. She is on home oxygen at 3-4 L chronically. She denies any fevers or chills. She denies any vomiting or diarrhea.  Patient is a 80 y.o. female presenting with shortness of breath.  Shortness of Breath Associated symptoms: cough   Associated symptoms: no abdominal pain, no chest pain, no diaphoresis, no fever, no headaches, no rash and no vomiting     Past Medical History  Diagnosis Date  . COPD (chronic obstructive pulmonary disease)   . Oxygen dependent   . Aortic stenosis, mild   . Obesity   . Hypertension   . Anxiety   . Back pain   . Headache(784.0)   . Breast cancer   . Asthma   . Sleep apnea   . DEMENTIA   . GERD (gastroesophageal reflux disease)   . Arthritis   . Pneumonia   . H/O hiatal hernia   . Depression   . Lymphadenopathy 10/07/2012   Past Surgical History  Procedure Laterality Date  . Cardiac catheterization      EF 55-60%  . Tonsillectomy and adenoidectomy  1942  . Breast reconstruction    . Appendectomy    . Mastectomy  1980    Family History  Problem Relation Age of Onset  . Tuberculosis Mother   . Lung cancer Father    History  Substance Use Topics  . Smoking status: Former Smoker -- 1.00 packs/day for 40 years    Types: Cigarettes    Quit date: 02/11/1990  . Smokeless tobacco: Never Used  . Alcohol Use: 4.2 oz/week    7 Glasses of wine per week   OB History   Grav Para Term Preterm Abortions TAB SAB Ect Mult Living                 Review of Systems  Constitutional: Negative for fever, chills, diaphoresis and fatigue.  HENT: Negative for congestion, rhinorrhea and sneezing.   Eyes: Negative.   Respiratory: Positive for cough and shortness of breath. Negative for chest tightness.   Cardiovascular: Negative for chest pain and leg swelling.  Gastrointestinal: Negative for nausea, vomiting, abdominal pain, diarrhea and blood in stool.  Genitourinary: Negative for frequency, hematuria, flank pain and difficulty urinating.  Musculoskeletal: Negative for arthralgias and back pain.  Skin: Negative for rash.  Neurological: Negative for dizziness, speech difficulty, weakness, numbness and headaches.      Allergies  Other  Home Medications   Current Outpatient Rx  Name  Route  Sig  Dispense  Refill  . acetaminophen (TYLENOL) 500 MG tablet  Oral   Take 500 mg by mouth every 6 (six) hours as needed (pain.).          Marland Kitchen albuterol (PROVENTIL HFA;VENTOLIN HFA) 108 (90 BASE) MCG/ACT inhaler   Inhalation   Inhale 2 puffs into the lungs every 4 (four) hours as needed for wheezing or shortness of breath.         . ALPRAZolam (XANAX) 1 MG tablet   Oral   Take 1 tablet (1 mg total) by mouth 2 (two) times daily as needed for anxiety.   60 tablet   0   . aspirin 81 MG tablet   Oral   Take 81 mg by mouth daily.         Marland Kitchen atorvastatin (LIPITOR) 40 MG tablet   Oral   Take 40 mg by mouth daily.         . budesonide (PULMICORT) 0.5 MG/2ML nebulizer solution   Nebulization   Take 0.5 mg by  nebulization 2 (two) times daily.         . chlorpheniramine (CHLOR-TRIMETON) 4 MG tablet      2 tabs by mouth at bedtime.  May add 1 extra every 6 hours as needed for drippy nose         . clobetasol cream (TEMOVATE) 0.05 %   Topical   Apply 1 application topically 2 (two) times daily.         Marland Kitchen Dextromethorphan Polistirex (COUGH DM PO)      Per bottle directions as needed for cough.         . dimenhyDRINATE (DRAMAMINE) 50 MG tablet   Oral   Take 50 mg by mouth every 8 (eight) hours as needed for dizziness.         . diphenhydrAMINE (BENADRYL) 25 MG tablet   Oral   Take 25 mg by mouth every 6 (six) hours as needed for allergies.         Marland Kitchen estradiol (ESTRACE) 0.1 MG/GM vaginal cream   Vaginal   Place 1 Applicatorful vaginally as needed (use as directed).         . fenofibrate micronized (LOFIBRA) 134 MG capsule   Oral   Take 1 capsule (134 mg total) by mouth daily before breakfast.   90 capsule   0   . ipratropium-albuterol (DUONEB) 0.5-2.5 (3) MG/3ML SOLN   Nebulization   Take 3 mLs by nebulization 4 (four) times daily.   360 mL   11   . metoprolol tartrate (LOPRESSOR) 25 MG tablet   Oral   Take 0.5 tablets (12.5 mg total) by mouth 2 (two) times daily.   90 tablet   4   . OXYGEN-HELIUM IN      Wear 3 lpm continuously and increase to to 4 lpm as needed for shortness of breath w/ activity         . oxymetazoline (AFRIN) 0.05 % nasal spray   Each Nare   Place 1 spray into both nostrils 2 (two) times daily as needed for congestion.         . phenylephrine (SUDAFED PE) 10 MG TABS tablet   Oral   Take 10 mg by mouth every 6 (six) hours as needed (cough/ congestion).         . ranitidine (ZANTAC) 150 MG tablet   Oral   Take 150 mg by mouth as needed.          . Simethicone (GAS-X PO)   Oral   Take 1  tablet by mouth as needed (gas.).          Marland Kitchen valsartan-hydrochlorothiazide (DIOVAN-HCT) 80-12.5 MG per tablet   Oral   Take 1 tablet by  mouth daily.         Marland Kitchen Respiratory Therapy Supplies (FLUTTER) DEVI      Use as directed   1 each   0   . Respiratory Therapy Supplies (FLUTTER) DEVI      Use as directed   1 each   0    BP 166/59  Pulse 124  Temp(Src) 98.7 F (37.1 C) (Oral)  Resp 30  SpO2 100% Physical Exam  Constitutional: She is oriented to person, place, and time. She appears well-developed and well-nourished.  HENT:  Head: Normocephalic and atraumatic.  Mouth/Throat: Oropharynx is clear and moist.  Eyes: Pupils are equal, round, and reactive to light.  Neck: Normal range of motion. Neck supple.  Cardiovascular: Normal rate, regular rhythm and normal heart sounds.   Pulmonary/Chest: Effort normal. No respiratory distress. She has wheezes. She has no rales. She exhibits no tenderness.  Patient has tachypnea with some increased work of breathing. She is talking in full sentences. She has some expiratory wheezing and diminished breath sounds bilaterally  Abdominal: Soft. Bowel sounds are normal. There is no tenderness. There is no rebound and no guarding.  Musculoskeletal: Normal range of motion. She exhibits no edema.  Lymphadenopathy:    She has no cervical adenopathy.  Neurological: She is alert and oriented to person, place, and time.  Skin: Skin is warm and dry. No rash noted.  Psychiatric: She has a normal mood and affect.    ED Course  Procedures (including critical care time) Labs Review Labs Reviewed  BASIC METABOLIC PANEL - Abnormal; Notable for the following:    Potassium 3.5 (*)    CO2 36 (*)    Glucose, Bld 179 (*)    GFR calc non Af Amer 78 (*)    All other components within normal limits  CBC - Abnormal; Notable for the following:    WBC 15.3 (*)    Hemoglobin 11.5 (*)    All other components within normal limits  TROPONIN I   Imaging Review Dg Chest 2 View (if Patient Has Fever And/or Copd)  02/25/2013   CLINICAL DATA:  Productive cough. Shortness of breath. Current history  of COPD, hypertension and asthma.  EXAM: CHEST  2 VIEW  COMPARISON:  DG CHEST 2 VIEW dated 01/18/2013; DG CHEST 2V dated 01/14/2013; DG CHEST 2V dated 10/07/2012; DG CHEST 2 VIEW dated 02/03/2011; DG CHEST 1V PORT dated 12/21/2010  FINDINGS: Cardiac silhouette mildly to moderately enlarged, unchanged allowing for differences in technique. Thoracic aorta atherosclerotic, unchanged. Hilar and mediastinal contours otherwise unremarkable. Hyperinflation and emphysematous changes in the upper lobes, unchanged. Prominent bronchovascular markings diffusely and central peribronchial thickening, more so than on the prior examinations. Patchy airspace opacities in the left lower lobe. No visible pleural effusions. Degenerative changes involving the thoracic spine and osseous demineralization.  IMPRESSION: 1. Left lower lobe pneumonia superimposed upon COPD/emphysema and moderate changes of acute bronchitis. 2. Stable cardiomegaly without pulmonary edema.   Electronically Signed   By: Evangeline Dakin M.D.   On: 02/25/2013 18:30    EKG Interpretation    Date/Time:  Friday February 25 2013 17:32:38 EST Ventricular Rate:  126 PR Interval:  146 QRS Duration: 115 QT Interval:  316 QTC Calculation: 457 R Axis:   -73 Text Interpretation:  Sinus tachycardia Left anterior fascicular  block LVH with secondary repolarization abnormality Since last tracing rate faster Confirmed by Dayannara Pascal  MD, Cyril Railey ([id number]) on 02/25/2013 6:34:38 PM            MDM   Final diagnoses:  COPD exacerbation  Community acquired pneumonia    Patient presents with a COPD exacerbation. She has evidence of pneumonia on exam. She has no recent hospitalizations and I did start her on antibiotics for presumed community-acquired pneumonia. She was given Solu-Medrol by EMS. She was given a second albuterol neb here in the ED. She's a little tachypneic but is talking in full sentences. I spoke with the hospitalist who will admit the  patient.    Malvin Johns, MD 02/25/13 [phone]

## 2013-02-25 NOTE — ED Notes (Signed)
MD at bedside. 

## 2013-02-25 NOTE — Patient Instructions (Signed)
Acute Respiratory Distress Syndrome  Acute respiratory distress syndrome (ARDS) is a serious, life-threatening lung condition that can cause breathing failure. It occurs in people who are critically ill or in people who have had a serious injury.  CAUSES  ARDS occurs when small blood vessels in the lungs leak fluid into the air sacs (alveoli) of the lungs. The fluid causes the lungs to become "stiff" and decreases the lungs' ability to inflate. The fluid also prevents oxygen from being absorbed into the bloodstream. When the bloodstream does not have enough oxygen, the body's vital organs do not get enough oxygen to function properly. ARDS can occur in the following conditions:  Sepsis. This is a serious bloodstream infection.  Serious injury (trauma) to the head or chest.  Pneumonia.  After major surgery, such as a lung transplant.  Drug overdose.  Breathing (inhalation) of harmful chemicals. SYMPTOMS  ARDS comes on quickly (rapid onset) and can occur within 24 to 48 hours of an infection, illness, surgery, or injury. Symptoms include:  Shortness of breath or difficulty breathing.  Cyanosis. This is a bluish color to the skin or nailbeds (due to low oxygen levels in the blood).  Fast or irregular heart rate.  Low blood pressure (hypotension).  Organ failure. DIAGNOSIS  There is not a specific test to diagnose ARDS. It is usually diagnosed when other diseases and conditions that cause similar symtpoms have been ruled out. When a person is thought to have ARDS, the following tests may be performed:  A chest X-ray or computed tomography (CT) scan to look at the lungs.  Arterial blood gas (ABG) analysis. This test looks at the oxygen level in the blood.  Blood tests to rule out infection.  Sputum culture to rule out a lung infection.  Bronchoscopy. TREATMENT  ARDS is a critical condition. People who develop ARDS need to be in a hospital intensive care unit (ICU). Treatment of  ARDS includes:  Providing oxygenation. This is a main treatment goal of ARDS. A breathing machine (ventilator) is often used to help a person breathe and to provide oxygen. When on a breathing machine, medicine is given to keep patients asleep (sedated).  Treatment of the underlying cause of ARDS (infection, illness, or trauma).  Supportive treatment such as:  Intravenous (IV) fluids.  Liquid nutrition that goes through an IV or feeding tube.  Blood pressure medicine to support low blood pressure.  Antibiotic medicine to help fight infection.  Steroid medicine to help decrease swelling (inflammation) in the lungs.  Diuretic medicine to get rid of extra fluid in the body. HOME CARE INSTRUCTIONS  After recovering from ARDS, you may have weakness, shortness of breath, or memory problems. You may also suffer from depression or from complications of the illness that caused ARDS. You can do several things to help your recovery:  Do not smoke.  If you drink alcohol, limit the amount of alcohol you drink to 1 or 2 drinks a day.  Be sure you get a yearly flu (influenza) shot. You should get a pneumonia vaccine once every 5 years.  Ask your caregiver about lung rehabilitation programs.  Ask your caregiver about local support groups for people with breathing problems.  Ask friends and family to help you if daily activities make you tired. SEEK MEDICAL CARE IF:   You become short of breath with activity or while at rest.  You develop a cough that does not go away. SEEK IMMEDIATE MEDICAL CARE IF:   You have sudden  shortness of breath with or without chest pain.  You have chest pain that does not go away.  You develop swelling or pain in one of your legs.  You have trauma to your chest or any other part of your body.  You have a fever.  You overdose or have a reaction to your medicine. MAKE SURE YOU:   Understand these instructions.  Will watch your condition.  Will get  help right away if you are not doing well or get worse. Document Released: 12/23/2004 Document Revised: 03/17/2011 Document Reviewed: 09/11/2010 [facility] Patient Information 2014 [place], [place].

## 2013-02-25 NOTE — ED Notes (Signed)
Bed: WA16 Expected date:  Expected time:  Means of arrival:  Comments: EMS-SOB 

## 2013-02-25 NOTE — H&P (Signed)
[facility] History and Physical  Katie Bryant:[id number] DOB: 1928/08/15 DOA: 02/25/2013  Referring physician: EDP PCP: Anthoney Harada, MD   Chief Complaint: Cough and worsening shortness of breath  HPI: Katie Bryant is a 80 y.o. female past medical history significant for COPD-home O2 dependent (3-4 L) followed by Dr. [name], hypertension, mild aortic stenosis, GERD, anxiety who presents with above complaints. She states she traveled to [place] to visit her granddaughter about a week ago and while there developed shortness of breath which has been worsening. About 3 days ago she developed a cough productive of brown to greenish sputum. She denies fevers, also denies chest pain and no leg swelling. She is her PCP due to her worsening symptoms there and was reported that the O2 sat was 53%, she was placed on oxygen per EMS and , given duo neb and IV Solu-Medrol and her O2 sats on arrival in the ED was 95% on 4 L nasal cannula. Chest x-ray done in the ED showed left lower lobe pneumonia superimposed on COPD/emphysema. She is admitted for further evaluation and management.   Review of Systems The patient denies anorexia, fever, weight loss,, vision loss, decreased hearing, hoarseness, chest pain, syncope,  , peripheral edema, balance deficits, hemoptysis, abdominal pain, melena, hematochezia, severe indigestion/heartburn, hematuria, incontinence, genital sores, muscle weakness,, transient blindness, difficulty walking, depression, unusual weight change, abnormal bleeding.   Past Medical History  Diagnosis Date  . COPD (chronic obstructive pulmonary disease)   . Oxygen dependent   . Aortic stenosis, mild   . Obesity   . Hypertension   . Anxiety   . Back pain   . Headache(784.0)   . Breast cancer   . Asthma   . Sleep apnea   . DEMENTIA   . GERD (gastroesophageal reflux disease)   . Arthritis   . Pneumonia   . H/O hiatal hernia   . Depression   . Lymphadenopathy  10/07/2012   Past Surgical History  Procedure Laterality Date  . Cardiac catheterization      EF 55-60%  . Tonsillectomy and adenoidectomy  1942  . Breast reconstruction    . Appendectomy    . Mastectomy  1980   Social History:  reports that she quit smoking about 23 years ago. Her smoking use included Cigarettes. She has a 40 pack-year smoking history. She has never used smokeless tobacco. She reports that she drinks about 4.2 ounces of alcohol per week. She reports that she does not use illicit drugs.  Allergies  Allergen Reactions  . Other     STEROIDS, pt staes she blows up and gains weight, oral only    Family History  Problem Relation Age of Onset  . Tuberculosis Mother   . Lung cancer Father      Prior to Admission medications   Medication Sig Start Date End Date Taking? Authorizing Provider  acetaminophen (TYLENOL) 500 MG tablet Take 500 mg by mouth every 6 (six) hours as needed (pain.).    Yes Historical Provider, MD  albuterol (PROVENTIL HFA;VENTOLIN HFA) 108 (90 BASE) MCG/ACT inhaler Inhale 2 puffs into the lungs every 4 (four) hours as needed for wheezing or shortness of breath.   Yes Historical Provider, MD  ALPRAZolam Duanne Moron) 1 MG tablet Take 1 tablet (1 mg total) by mouth 2 (two) times daily as needed for anxiety. 01/24/13  Yes Vernie Shanks, MD  aspirin 81 MG tablet Take 81 mg by mouth daily.   Yes Historical Provider, MD  atorvastatin (LIPITOR) 40 MG tablet Take 40 mg by mouth daily.   Yes Historical Provider, MD  budesonide (PULMICORT) 0.5 MG/2ML nebulizer solution Take 0.5 mg by nebulization 2 (two) times daily.   Yes Historical Provider, MD  chlorpheniramine (CHLOR-TRIMETON) 4 MG tablet 2 tabs by mouth at bedtime.  May add 1 extra every 6 hours as needed for drippy nose   Yes Historical Provider, MD  clobetasol cream (TEMOVATE) [id number] % Apply 1 application topically 2 (two) times daily.   Yes Historical Provider, MD  Dextromethorphan Polistirex (COUGH DM PO) Per  bottle directions as needed for cough.   Yes Historical Provider, MD  dimenhyDRINATE (DRAMAMINE) 50 MG tablet Take 50 mg by mouth every 8 (eight) hours as needed for dizziness.   Yes Historical Provider, MD  diphenhydrAMINE (BENADRYL) 25 MG tablet Take 25 mg by mouth every 6 (six) hours as needed for allergies.   Yes Historical Provider, MD  estradiol (ESTRACE) 0.1 MG/GM vaginal cream Place 1 Applicatorful vaginally as needed (use as directed).   Yes Historical Provider, MD  fenofibrate micronized (LOFIBRA) 134 MG capsule Take 1 capsule (134 mg total) by mouth daily before breakfast. 01/26/13  Yes Minus Breeding, MD  ipratropium-albuterol (DUONEB) 0.5-2.5 (3) MG/3ML SOLN Take 3 mLs by nebulization 4 (four) times daily. 10/07/12  Yes Tanda Rockers, MD  metoprolol tartrate (LOPRESSOR) 25 MG tablet Take 0.5 tablets (12.5 mg total) by mouth 2 (two) times daily. 02/27/12  Yes Thayer Headings, MD  OXYGEN-HELIUM IN Wear 3 lpm continuously and increase to to 4 lpm as needed for shortness of breath w/ activity   Yes Historical Provider, MD  oxymetazoline (AFRIN) 0.05 % nasal spray Place 1 spray into both nostrils 2 (two) times daily as needed for congestion.   Yes Historical Provider, MD  phenylephrine (SUDAFED PE) 10 MG TABS tablet Take 10 mg by mouth every 6 (six) hours as needed (cough/ congestion).   Yes Historical Provider, MD  ranitidine (ZANTAC) 150 MG tablet Take 150 mg by mouth as needed.    Yes Historical Provider, MD  Simethicone (GAS-X PO) Take 1 tablet by mouth as needed (gas.).    Yes Historical Provider, MD  valsartan-hydrochlorothiazide (DIOVAN-HCT) 80-12.5 MG per tablet Take 1 tablet by mouth daily.   Yes Historical Provider, MD  Respiratory Therapy Supplies (FLUTTER) DEVI Use as directed 06/20/11   Tanda Rockers, MD  Respiratory Therapy Supplies (FLUTTER) DEVI Use as directed 01/21/13   Tanda Rockers, MD   Physical Exam: Filed Vitals:   02/25/13 1724  BP: 166/59  Pulse: 124  Temp: 98.7 F  (37.1 C)  Resp: 30    BP 166/59  Pulse 124  Temp(Src) 98.7 F (37.1 C) (Oral)  Resp 30  SpO2 92% Constitutional: Vital signs reviewed.  Patient is a well-developed and well-nourished tachypneic, and cooperative with exam. Alert and oriented x3.  Head: Normocephalic and atraumatic Mouth: no erythema or exudates, MMM Eyes: PERRL, EOMI, conjunctivae normal, No scleral icterus.  Neck: Supple, Trachea midline normal ROM, No JVD, mass, thyromegaly, or carotid bruit present.  Cardiovascular: RRR, S1 normal, S2 normal, 2/6 systolic murmur, pulses symmetric and intact bilaterally Pulmonary/Chest: Decreased air entry bilaterally, no wheezes, rales, or rhonchi Abdominal: Soft. Non-tender, non-distended, bowel sounds are normal, no masses, organomegaly, or guarding present.  Extremities: No cyanosis and no edema  Neurological: A&O x3, Strength is normal and symmetric bilaterally, cranial nerve II-XII are grossly intact, no focal motor deficit, sensory intact to light touch bilaterally.  Skin: Warm, dry and intact. No rash, cyanosis, or clubbing.  Psychiatric: Normal mood and affect. speech and behavior is normal.               Labs on Admission:  Basic Metabolic Panel:  Recent Labs Lab 02/25/13 1740  NA 142  K 3.5*  CL 96  CO2 36*  GLUCOSE 179*  BUN 16  CREATININE 0.72  CALCIUM 9.8   Liver Function Tests: No results found for this basename: AST, ALT, ALKPHOS, BILITOT, PROT, ALBUMIN,  in the last 168 hours No results found for this basename: LIPASE, AMYLASE,  in the last 168 hours No results found for this basename: AMMONIA,  in the last 168 hours CBC:  Recent Labs Lab 02/25/13 1740  WBC 15.3*  HGB 11.5*  HCT 37.2  MCV 95.9  PLT 198   Cardiac Enzymes:  Recent Labs Lab 02/25/13 1740  TROPONINI <0.30    BNP (last 3 results) No results found for this basename: PROBNP,  in the last 8760 hours CBG: No results found for this basename: GLUCAP,  in the last 168  hours  Radiological Exams on Admission: Dg Chest 2 View (if Patient Has Fever And/or Copd)  02/25/2013   CLINICAL DATA:  Productive cough. Shortness of breath. Current history of COPD, hypertension and asthma.  EXAM: CHEST  2 VIEW  COMPARISON:  DG CHEST 2 VIEW dated 01/18/2013; DG CHEST 2V dated 01/14/2013; DG CHEST 2V dated 10/07/2012; DG CHEST 2 VIEW dated 02/03/2011; DG CHEST 1V PORT dated 12/21/2010  FINDINGS: Cardiac silhouette mildly to moderately enlarged, unchanged allowing for differences in technique. Thoracic aorta atherosclerotic, unchanged. Hilar and mediastinal contours otherwise unremarkable. Hyperinflation and emphysematous changes in the upper lobes, unchanged. Prominent bronchovascular markings diffusely and central peribronchial thickening, more so than on the prior examinations. Patchy airspace opacities in the left lower lobe. No visible pleural effusions. Degenerative changes involving the thoracic spine and osseous demineralization.  IMPRESSION: 1. Left lower lobe pneumonia superimposed upon COPD/emphysema and moderate changes of acute bronchitis. 2. Stable cardiomegaly without pulmonary edema.   Electronically Signed   By: Evangeline Dakin M.D.   On: 02/25/2013 18:30    EKG: Independently reviewed. Sinus tachycardia at 126, left anterior fascicular block noted  Assessment/Plan Active Problems:   Pneumonia, left lower lobe(CAP) -As discussed above, will obtain urine Legionella and strep pneumo antigens -Will continue empiric antibiotics with Rocephin and Zithromax, supplemental oxygen mucolytics and follow   COPD exacerbation -Will continue nebulized bronchodilators, IV Solu-Medrol, supplemental oxygen, mucolytics and antibiotics as above -She is followed by Dr. [name], follow and consult as clinically appropriate   Acute on Chronic respiratory failure -Secondary to above, see treatment as above   G E R D -Continue H2 blocker   History of Aortic stenosis -Stable, followed by Dr.  Percival Spanish HTN (hypertension) -Continue outpatient medications History of dementia -On no meds outpatient, follow.  History of breast cancer       Code Status: Full Family Communication: Husband and daughter Bedside Disposition Plan: Admit to telemetry  Time spent: >4mins  [facility] Pager [phone]

## 2013-02-25 NOTE — ED Notes (Signed)
Pt presents via EMS with c/o shortness of breath x 5 days, increasingly worse today. Pt has a hx of COPD and pneumonia, pt was seen at her MD office today and initial O2 sat was 58%. O2 came up to 90% with a nasal cannula, EMS placed her on 10L with a mask and O2 sats came up to 100%. Pt was given 125 solumedrol and one duoneb in route by EMS. Per EMS, lung fields were decreased and pt was noted to cough up some sputum. Per EMS, pt has some EKG changes that are new for her (left bundle branch block)

## 2013-02-25 NOTE — Progress Notes (Signed)
   Subjective:    Patient ID: Katie Bryant, female    DOB: [birth year], 80 y.o.   MRN: [id number]  HPI Patient in today c/o SOB and cough for the last 3 days- She has severe COPD and is on O2 at home- when she arrived here she was experiencing DOE with o2 sat of 59 on o2 at 3l via nasla cannula.    Review of Systems  Constitutional: Negative for fever and chills.  HENT: Positive for congestion.   Respiratory: Positive for cough, chest tightness, shortness of breath and wheezing.   Cardiovascular: Negative.   Gastrointestinal: Negative.   Genitourinary: Negative.   All other systems reviewed and are negative.       Objective:   Physical Exam  Constitutional: She is oriented to person, place, and time. She appears well-developed and well-nourished. She appears distressed.  HENT:  Right Ear: External ear normal.  Left Ear: External ear normal.  Nose: Nose normal.  Mouth/Throat: Oropharynx is clear and moist.  Eyes: Pupils are equal, round, and reactive to light.  Neck: Normal range of motion. Neck supple.  Pulmonary/Chest: She is in respiratory distress. She has no wheezes. She has no rales. She exhibits no tenderness.  Patient distressed- diminished breath sounds throughout  Abdominal: Soft.  Neurological: She is alert and oriented to person, place, and time.  Skin: Skin is warm and dry.  Psychiatric: She has a normal mood and affect. Her behavior is normal. Judgment and thought content normal.   BP 188/81  Pulse 124  Temp(Src) 98.3 F (36.8 C) (Oral)  SpO2 53%  4:30- o2 sat 15 L 91%- cough- anxious       Assessment & Plan:   1. Respiratory distress   2. Tachycardia    To [facility] via EMS Report given to EMS  Mary-Margaret Hassell Done, FNP

## 2013-02-26 DIAGNOSIS — J449 Chronic obstructive pulmonary disease, unspecified: Secondary | ICD-10-CM

## 2013-02-26 DIAGNOSIS — I359 Nonrheumatic aortic valve disorder, unspecified: Secondary | ICD-10-CM

## 2013-02-26 DIAGNOSIS — E785 Hyperlipidemia, unspecified: Secondary | ICD-10-CM

## 2013-02-26 LAB — EXPECTORATED SPUTUM ASSESSMENT W GRAM STAIN, RFLX TO RESP C

## 2013-02-26 LAB — CBC
HCT: 35.5 % — ABNORMAL LOW (ref 36.0–46.0)
Hemoglobin: 10.8 g/dL — ABNORMAL LOW (ref 12.0–15.0)
MCH: 29 pg (ref 26.0–34.0)
MCHC: 30.4 g/dL (ref 30.0–36.0)
MCV: 95.2 fL (ref 78.0–100.0)
Platelets: 183 10*3/uL (ref 150–400)
RBC: 3.73 MIL/uL — ABNORMAL LOW (ref 3.87–5.11)
RDW: 12.8 % (ref 11.5–15.5)
WBC: 14.5 10*3/uL — AB (ref 4.0–10.5)

## 2013-02-26 LAB — BASIC METABOLIC PANEL
BUN: 14 mg/dL (ref 6–23)
CO2: 33 mEq/L — ABNORMAL HIGH (ref 19–32)
CREATININE: 0.71 mg/dL (ref 0.50–1.10)
Calcium: 9.3 mg/dL (ref 8.4–10.5)
Chloride: 99 mEq/L (ref 96–112)
GFR, EST NON AFRICAN AMERICAN: 78 mL/min — AB (ref >90)
Glucose, Bld: 166 mg/dL — ABNORMAL HIGH (ref 70–99)
POTASSIUM: 4.4 meq/L (ref 3.7–5.3)
Sodium: 142 mEq/L (ref 137–147)

## 2013-02-26 LAB — EXPECTORATED SPUTUM ASSESSMENT W REFEX TO RESP CULTURE

## 2013-02-26 LAB — TROPONIN I

## 2013-02-26 MED ORDER — PREDNISONE 5 MG PO TABS: 5.0000 mg | ORAL_TABLET | Freq: Every day | ORAL | Status: DC

## 2013-02-26 MED ORDER — LEVOFLOXACIN 500 MG PO TABS: 500.0000 mg | ORAL_TABLET | Freq: Every day | ORAL | Status: DC

## 2013-02-26 NOTE — Discharge Summary (Signed)
Physician Discharge Summary  Katie Bryant ASN:[id number] DOB: [birth year] DOA: 02/25/2013  PCP: Anthoney Harada, MD  Admit date: 02/25/2013 Discharge date: 02/26/2013  Time spent: 35 minutes  Recommendations for Outpatient Follow-up:  1. Follow up with PCP in 1-2 weeks  Discharge Diagnoses:  Active Problems:   G E R D   Aortic stenosis   HTN (hypertension)   Chronic respiratory failure   COPD exacerbation   Pneumonia   PNA (pneumonia)   Discharge Condition: improved  Diet recommendation: Regular  Filed Weights   02/25/13 2000  Weight: 82.419 kg (181 lb 11.2 oz)    History of present illness:  Katie Bryant is a 80 y.o. female past medical history significant for COPD-home O2 dependent (3-4 L) followed by Dr. [name], hypertension, mild aortic stenosis, GERD, anxiety who presents with above complaints. She states she traveled to [place] to visit her granddaughter about a week ago and while there developed shortness of breath which has been worsening. About 3 days ago she developed a cough productive of brown to greenish sputum. She denies fevers, also denies chest pain and no leg swelling. She is her PCP due to her worsening symptoms there and was reported that the O2 sat was 53%, she was placed on oxygen per EMS and , given duo neb and IV Solu-Medrol and her O2 sats on arrival in the ED was 95% on 4 L nasal cannula. Chest x-ray done in the ED showed left lower lobe pneumonia superimposed on COPD/emphysema. She is admitted for further evaluation and management.  Hospital Course:  Pneumonia, left lower lobe(CAP)  -Continued empiric antibiotics with Rocephin and Zithromax, supplemental oxygen mucolytics - Pt reported feeling much improved and is on her baseline O2 - Pt to be discharged with a course of levaquin COPD exacerbation  -Would continue bronchodilators on discharge - Pt to complete steroid taper - continue home 4L [place] G E R D  -Continue H2 blocker  History of  Aortic stenosis  -Stable, followed by Dr. Percival Spanish  HTN (hypertension)  -Continue outpatient medications  History of dementia  -On no meds outpatient, follow.  History of breast cancer  Discharge Exam: Filed Vitals:   02/26/13 0204 02/26/13 0430 02/26/13 0453 02/26/13 1009  BP:  139/58    Pulse:  86    Temp:  97.5 F (36.4 C)    TempSrc:  Oral    Resp:  30    Height:      Weight:      SpO2: 98% 97% 90% 88%    General: awake, in nad Cardiovascular: regular, s1, s2 Respiratory: normal resp effort, no crackles  Discharge Instructions       Future Appointments Provider Department Dept Phone   03/02/2013 10:30 AM Tanda Rockers, MD [facility] [phone]   03/23/2013 12:00 PM Minus Breeding, MD [facility] [phone]   05/10/2013 11:20 AM Vernie Shanks, MD [facility] [phone]       Medication List         acetaminophen 500 MG tablet  Commonly known as:  TYLENOL  Take 500 mg by mouth every 6 (six) hours as needed (pain.).     albuterol 108 (90 BASE) MCG/ACT inhaler  Commonly known as:  PROVENTIL HFA;VENTOLIN HFA  Inhale 2 puffs into the lungs every 4 (four) hours as needed for wheezing or shortness of breath.     ALPRAZolam 1 MG tablet  Commonly known as:  XANAX  Take 1 tablet (1  mg total) by mouth 2 (two) times daily as needed for anxiety.     aspirin 81 MG tablet  Take 81 mg by mouth daily.     atorvastatin 40 MG tablet  Commonly known as:  LIPITOR  Take 40 mg by mouth daily.     budesonide 0.5 MG/2ML nebulizer solution  Commonly known as:  PULMICORT  Take 0.5 mg by nebulization 2 (two) times daily.     chlorpheniramine 4 MG tablet  Commonly known as:  CHLOR-TRIMETON  2 tabs by mouth at bedtime.  May add 1 extra every 6 hours as needed for drippy nose     clobetasol cream 0.05 %  Commonly known as:  TEMOVATE  Apply 1 application topically 2 (two) times daily.     COUGH DM PO  Per bottle directions  as needed for cough.     dimenhyDRINATE 50 MG tablet  Commonly known as:  DRAMAMINE  Take 50 mg by mouth every 8 (eight) hours as needed for dizziness.     diphenhydrAMINE 25 MG tablet  Commonly known as:  BENADRYL  Take 25 mg by mouth every 6 (six) hours as needed for allergies.     estradiol 0.1 MG/GM vaginal cream  Commonly known as:  ESTRACE  Place 1 Applicatorful vaginally as needed (use as directed).     fenofibrate micronized 134 MG capsule  Commonly known as:  LOFIBRA  Take 1 capsule (134 mg total) by mouth daily before breakfast.     FLUTTER Devi  Use as directed     FLUTTER Devi  Use as directed     GAS-X PO  Take 1 tablet by mouth as needed (gas.).     ipratropium-albuterol 0.5-2.5 (3) MG/3ML Soln  Commonly known as:  DUONEB  Take 3 mLs by nebulization 4 (four) times daily.     levofloxacin 500 MG tablet  Commonly known as:  LEVAQUIN  Take 1 tablet (500 mg total) by mouth daily.     metoprolol tartrate 25 MG tablet  Commonly known as:  LOPRESSOR  Take 0.5 tablets (12.5 mg total) by mouth 2 (two) times daily.     OXYGEN-HELIUM IN  Wear 3 lpm continuously and increase to to 4 lpm as needed for shortness of breath w/ activity     oxymetazoline 0.05 % nasal spray  Commonly known as:  AFRIN  Place 1 spray into both nostrils 2 (two) times daily as needed for congestion.     phenylephrine 10 MG Tabs tablet  Commonly known as:  SUDAFED PE  Take 10 mg by mouth every 6 (six) hours as needed (cough/ congestion).     predniSONE 5 MG tablet  Commonly known as:  DELTASONE  Take 1 tablet (5 mg total) by mouth daily with breakfast.     ranitidine 150 MG tablet  Commonly known as:  ZANTAC  Take 150 mg by mouth as needed.     valsartan-hydrochlorothiazide 80-12.5 MG per tablet  Commonly known as:  DIOVAN-HCT  Take 1 tablet by mouth daily.       Allergies  Allergen Reactions  . Other     STEROIDS, pt staes she blows up and gains weight, oral only    Follow-up Information   Follow up with Anthoney Harada, MD. Schedule an appointment as soon as possible for a visit in 1 week.   Specialty:  Family Medicine   Contact information:   [place] [address] [phone]  The results of significant diagnostics from this hospitalization (including imaging, microbiology, ancillary and laboratory) are listed below for reference.    Significant Diagnostic Studies: Dg Chest 2 View (if Patient Has Fever And/or Copd)  02/25/2013   CLINICAL DATA:  Productive cough. Shortness of breath. Current history of COPD, hypertension and asthma.  EXAM: CHEST  2 VIEW  COMPARISON:  DG CHEST 2 VIEW dated 01/18/2013; DG CHEST 2V dated 01/14/2013; DG CHEST 2V dated 10/07/2012; DG CHEST 2 VIEW dated 02/03/2011; DG CHEST 1V PORT dated 12/21/2010  FINDINGS: Cardiac silhouette mildly to moderately enlarged, unchanged allowing for differences in technique. Thoracic aorta atherosclerotic, unchanged. Hilar and mediastinal contours otherwise unremarkable. Hyperinflation and emphysematous changes in the upper lobes, unchanged. Prominent bronchovascular markings diffusely and central peribronchial thickening, more so than on the prior examinations. Patchy airspace opacities in the left lower lobe. No visible pleural effusions. Degenerative changes involving the thoracic spine and osseous demineralization.  IMPRESSION: 1. Left lower lobe pneumonia superimposed upon COPD/emphysema and moderate changes of acute bronchitis. 2. Stable cardiomegaly without pulmonary edema.   Electronically Signed   By: Evangeline Dakin M.D.   On: 02/25/2013 18:30    Microbiology: Recent Results (from the past 240 hour(s))  CULTURE, EXPECTORATED SPUTUM-ASSESSMENT     Status: None   Collection Time    02/25/13 11:47 PM      Result Value Ref Range Status   Specimen Description SPUTUM   Final   Special Requests NONE   Final   Sputum evaluation     Final   Value: THIS  SPECIMEN IS ACCEPTABLE. RESPIRATORY CULTURE REPORT TO FOLLOW.   Report Status 02/26/2013 FINAL   Final  CULTURE, RESPIRATORY (NON-EXPECTORATED)     Status: None   Collection Time    02/25/13 11:47 PM      Result Value Ref Range Status   Specimen Description SPUTUM   Final   Special Requests NONE   Final   Gram Stain     Final   Value: MODERATE WBC PRESENT,BOTH PMN AND MONONUCLEAR     NO SQUAMOUS EPITHELIAL CELLS SEEN     RARE GRAM POSITIVE COCCI IN PAIRS     Performed at [facility]   Culture PENDING   Incomplete   Report Status PENDING   Incomplete     Labs: Basic Metabolic Panel:  Recent Labs Lab 02/25/13 1740 02/26/13 0206  NA 142 142  K 3.5* 4.4  CL 96 99  CO2 36* 33*  GLUCOSE 179* 166*  BUN 16 14  CREATININE 0.72 0.71  CALCIUM 9.8 9.3   Liver Function Tests: No results found for this basename: AST, ALT, ALKPHOS, BILITOT, PROT, ALBUMIN,  in the last 168 hours No results found for this basename: LIPASE, AMYLASE,  in the last 168 hours No results found for this basename: AMMONIA,  in the last 168 hours CBC:  Recent Labs Lab 02/25/13 1740 02/25/13 2100 02/26/13 0206  WBC 15.3* 15.4* 14.5*  HGB 11.5* 11.1* 10.8*  HCT 37.2 36.1 35.5*  MCV 95.9 95.5 95.2  PLT 198 196 183   Cardiac Enzymes:  Recent Labs Lab 02/25/13 1740 02/25/13 2100 02/26/13 0206  TROPONINI <0.30 <0.30 <0.30   BNP: BNP (last 3 results) No results found for this basename: PROBNP,  in the last 8760 hours CBG: No results found for this basename: GLUCAP,  in the last 168 hours   Signed:  CHIU, STEPHEN K  [facility] 02/26/2013, 4:23 PM

## 2013-02-26 NOTE — Progress Notes (Signed)
[facility] PROGRESS NOTE  Katie Bryant DPO:[id number] DOB: [birth year] DOA: 02/25/2013 PCP: Anthoney Harada, MD  Assessment/Plan: Pneumonia, left lower lobe(CAP)  -Will continue empiric antibiotics with Rocephin and Zithromax, supplemental oxygen mucolytics and follow  COPD exacerbation  -Will continue nebulized bronchodilators, IV Solu-Medrol, supplemental oxygen, mucolytics and antibiotics as above  -She is followed by Dr. [name], follow and consult as clinically appropriate  G E R D  -Continue H2 blocker  History of Aortic stenosis  -Stable, followed by Dr. Percival Spanish  HTN (hypertension)  -Continue outpatient medications  History of dementia  -On no meds outpatient, follow.  History of breast cancer  Code Status: Full Family Communication: Pt in room (indicate person spoken with, relationship, and if by phone, the number) Disposition Plan: Pending   Consultants:    Procedures:    Antibiotics:  Rocephin 02/25/13  Azithromycin 02/25/13  HPI/Subjective: Reports feeling better. Eager to go home  Objective: Filed Vitals:   02/26/13 0204 02/26/13 0430 02/26/13 0453 02/26/13 1009  BP:  139/58    Pulse:  86    Temp:  97.5 F (36.4 C)    TempSrc:  Oral    Resp:  30    Height:      Weight:      SpO2: 98% 97% 90% 88%    Intake/Output Summary (Last 24 hours) at 02/26/13 1426 Last data filed at 02/26/13 0700  Gross per 24 hour  Intake  752.5 ml  Output    800 ml  Net  -47.5 ml   Filed Weights   02/25/13 2000  Weight: 82.419 kg (181 lb 11.2 oz)    Exam:   General:  Awake, in nad  Cardiovascular: regular, s1, s2  Respiratory: normal resp effort, no wheezing, decreased BS  Abdomen: soft, nondistended  Musculoskeletal: perfused, no clubbing   Data Reviewed: Basic Metabolic Panel:  Recent Labs Lab 02/25/13 1740 02/26/13 0206  NA 142 142  K 3.5* 4.4  CL 96 99  CO2 36* 33*  GLUCOSE 179* 166*  BUN 16 14  CREATININE 0.72 0.71   CALCIUM 9.8 9.3   Liver Function Tests: No results found for this basename: AST, ALT, ALKPHOS, BILITOT, PROT, ALBUMIN,  in the last 168 hours No results found for this basename: LIPASE, AMYLASE,  in the last 168 hours No results found for this basename: AMMONIA,  in the last 168 hours CBC:  Recent Labs Lab 02/25/13 1740 02/25/13 2100 02/26/13 0206  WBC 15.3* 15.4* 14.5*  HGB 11.5* 11.1* 10.8*  HCT 37.2 36.1 35.5*  MCV 95.9 95.5 95.2  PLT 198 196 183   Cardiac Enzymes:  Recent Labs Lab 02/25/13 1740 02/25/13 2100 02/26/13 0206  TROPONINI <0.30 <0.30 <0.30   BNP (last 3 results) No results found for this basename: PROBNP,  in the last 8760 hours CBG: No results found for this basename: GLUCAP,  in the last 168 hours  Recent Results (from the past 240 hour(s))  CULTURE, EXPECTORATED SPUTUM-ASSESSMENT     Status: None   Collection Time    02/25/13 11:47 PM      Result Value Ref Range Status   Specimen Description SPUTUM   Final   Special Requests NONE   Final   Sputum evaluation     Final   Value: THIS SPECIMEN IS ACCEPTABLE. RESPIRATORY CULTURE REPORT TO FOLLOW.   Report Status 02/26/2013 FINAL   Final  CULTURE, RESPIRATORY (NON-EXPECTORATED)     Status: None   Collection Time    02/25/13  11:47 PM      Result Value Ref Range Status   Specimen Description SPUTUM   Final   Special Requests NONE   Final   Gram Stain     Final   Value: MODERATE WBC PRESENT,BOTH PMN AND MONONUCLEAR     NO SQUAMOUS EPITHELIAL CELLS SEEN     RARE GRAM POSITIVE COCCI IN PAIRS     Performed at [facility]   Culture PENDING   Incomplete   Report Status PENDING   Incomplete     Studies: Dg Chest 2 View (if Patient Has Fever And/or Copd)  02/25/2013   CLINICAL DATA:  Productive cough. Shortness of breath. Current history of COPD, hypertension and asthma.  EXAM: CHEST  2 VIEW  COMPARISON:  DG CHEST 2 VIEW dated 01/18/2013; DG CHEST 2V dated 01/14/2013; DG CHEST 2V dated 10/07/2012;  DG CHEST 2 VIEW dated 02/03/2011; DG CHEST 1V PORT dated 12/21/2010  FINDINGS: Cardiac silhouette mildly to moderately enlarged, unchanged allowing for differences in technique. Thoracic aorta atherosclerotic, unchanged. Hilar and mediastinal contours otherwise unremarkable. Hyperinflation and emphysematous changes in the upper lobes, unchanged. Prominent bronchovascular markings diffusely and central peribronchial thickening, more so than on the prior examinations. Patchy airspace opacities in the left lower lobe. No visible pleural effusions. Degenerative changes involving the thoracic spine and osseous demineralization.  IMPRESSION: 1. Left lower lobe pneumonia superimposed upon COPD/emphysema and moderate changes of acute bronchitis. 2. Stable cardiomegaly without pulmonary edema.   Electronically Signed   By: Evangeline Dakin M.D.   On: 02/25/2013 18:30    Scheduled Meds: . aspirin  81 mg Oral Daily  . atorvastatin  40 mg Oral Daily  . azithromycin  500 mg Intravenous Once  . azithromycin  500 mg Oral Q24H  . budesonide  0.5 mg Nebulization BID  . cefTRIAXone (ROCEPHIN)  IV  1 g Intravenous Q24H  . clobetasol cream  1 application Topical BID  . enoxaparin (LOVENOX) injection  40 mg Subcutaneous Q24H  . famotidine  20 mg Oral BID  . fenofibrate  160 mg Oral Daily  . irbesartan  75 mg Oral Daily   And  . hydrochlorothiazide  12.5 mg Oral Daily  . ipratropium-albuterol  3 mL Nebulization Q6H  . methylPREDNISolone (SOLU-MEDROL) injection  40 mg Intravenous Q12H  . metoprolol tartrate  12.5 mg Oral BID   Continuous Infusions:   Active Problems:   G E R D   Aortic stenosis   HTN (hypertension)   Chronic respiratory failure   COPD exacerbation   Pneumonia   PNA (pneumonia)  Time spent: 49min  Aldahir Litaker, [facility] Pager [phone]. If 7PM-7AM, please contact night-coverage at [email or web address], password [facility] 02/26/2013, 2:26 PM  LOS: 1 day

## 2013-02-26 NOTE — Evaluation (Signed)
Physical Therapy One Time Evaluation Patient Details Name: Katie Bryant MRN: [id number] DOB: 08/23/1928 Today's Date: 02/26/2013 Time: 0938-1829 PT Time Calculation (min): 16 min  PT Assessment / Plan / Recommendation History of Present Illness  80 y.o. female past medical history significant for COPD-home O2 dependent (3-4 L) followed by Dr. [name], hypertension, mild aortic stenosis, GERD, anxiety who presents with above complaints. She states she traveled to [place] to visit her granddaughter about a week ago and while there developed shortness of breath which has been worsening. About 3 days ago she developed a cough productive of brown to greenish sputum. She denies fevers, also denies chest pain and no leg swelling. She is her PCP due to her worsening symptoms there and was reported that the O2 sat was 53%, she was placed on oxygen per EMS and , given duo neb and IV Solu-Medrol and her O2 sats on arrival in the ED was 95% on 4 L nasal cannula. Chest x-ray done in the ED showed left lower lobe pneumonia superimposed on COPD/emphysema. She is admitted for further evaluation and management.  Clinical Impression  Patient evaluated by Physical Therapy with no further acute PT needs identified. All education has been completed and the patient has no further questions.  No follow-up Physial Therapy or equipment needs. PT is signing off. Thank you for this referral.  Pt reports she usually only ambulates 50 feet at a time, usually on 3L O2 [place] at home.  Pt educated on pursed lip breathing and to take rest breaks as needed at home.  Also recommended not talking during activity to conserve energy with activity.  Discussed possibility of remaining on 4L O2 to maintain better saturations upon d/c since pt currently on 4L today (see below for SpO2 during ambulation).  No unsteady gait or LOB observed and pt only c/o fatigue and increased dyspnea with increasing distance in ambulation.  Pt states she has SPC and  RW available at home if needed.       PT Assessment  Patent does not need any further PT services    Follow Up Recommendations  No PT follow up    Does the patient have the potential to tolerate intense rehabilitation      Barriers to Discharge        Equipment Recommendations  None recommended by PT    Recommendations for Other Services     Frequency      Precautions / Restrictions Precautions Precaution Comments: chronic O2   Pertinent Vitals/Pain SpO2 at rest on 4L O2 [place] 92% SpO2 during ambulation on 4L O2 dropped to 83% however improved to 90% with pursed lip breathing      Mobility  Bed Mobility Overal bed mobility: Modified Independent Transfers Overall transfer level: Modified independent Ambulation/Gait Ambulation/Gait assistance: Supervision Ambulation Distance (Feet): 200 Feet Assistive device: None Gait Pattern/deviations: WFL(Within Functional Limits) General Gait Details: ambulated on 4L O2 [place], short rest break to check SpO2 halfway: 93% however decreased to 83% upon returning to room, verbal cues for pursed lip breathing    Exercises     PT Diagnosis:    PT Problem List:   PT Treatment Interventions:       PT Goals(Current goals can be found in the care plan section) Acute Rehab PT Goals PT Goal Formulation: No goals set, d/c therapy  Visit Information  Last PT Received On: 02/26/13 Assistance Needed: +1 History of Present Illness: 80 y.o. female past medical history significant for COPD-home O2  dependent (3-4 L) followed by Dr. [name], hypertension, mild aortic stenosis, GERD, anxiety who presents with above complaints. She states she traveled to [place] to visit her granddaughter about a week ago and while there developed shortness of breath which has been worsening. About 3 days ago she developed a cough productive of brown to greenish sputum. She denies fevers, also denies chest pain and no leg swelling. She is her PCP due to her worsening  symptoms there and was reported that the O2 sat was 53%, she was placed on oxygen per EMS and , given duo neb and IV Solu-Medrol and her O2 sats on arrival in the ED was 95% on 4 L nasal cannula. Chest x-ray done in the ED showed left lower lobe pneumonia superimposed on COPD/emphysema. She is admitted for further evaluation and management.       Prior [place] expects to be discharged to:: Private residence Living Arrangements: Spouse/significant other Type of Home: [place]: Multi-level Alternate Level Stairs-Number of Steps: sets of 2 steps and 3 steps within home however both have one [place]: [place] - 2 wheels;Cane - single point Prior Function Level of Independence: Independent Communication Communication: No difficulties    Cognition  Cognition Arousal/Alertness: Awake/alert Behavior During Therapy: WFL for tasks assessed/performed Overall Cognitive Status: Within Functional Limits for tasks assessed    Extremity/Trunk Assessment Lower Extremity Assessment Lower Extremity Assessment: Overall WFL for tasks assessed   Balance    End of Session PT - End of Session Equipment Utilized During Treatment: Oxygen Activity Tolerance: Patient tolerated treatment well Patient left: in bed;with call bell/phone within reach;with family/visitor present  GP     Nalin Mazzocco,KATHrine E 02/26/2013, 3:54 PM Carmelia Bake, PT, DPT 02/26/2013 Pager: [phone]

## 2013-02-28 LAB — CULTURE, RESPIRATORY W GRAM STAIN: Culture: NORMAL

## 2013-02-28 LAB — CULTURE, RESPIRATORY

## 2013-03-02 ENCOUNTER — Ambulatory Visit (INDEPENDENT_AMBULATORY_CARE_PROVIDER_SITE_OTHER)
Admission: RE | Admit: 2013-03-02 | Discharge: 2013-03-02 | Disposition: A | Discharge status: Home / Self Care | Payer: Medicare Other | Source: Ambulatory Visit | Attending: Internal Medicine | Admitting: Internal Medicine

## 2013-03-02 ENCOUNTER — Ambulatory Visit (INDEPENDENT_AMBULATORY_CARE_PROVIDER_SITE_OTHER): Payer: Medicare Other | Admitting: Internal Medicine

## 2013-03-02 ENCOUNTER — Encounter: Payer: Self-pay | Admitting: Internal Medicine

## 2013-03-02 VITALS — BP 140/60 | HR 102 | Wt 183.2 lb

## 2013-03-02 DIAGNOSIS — J449 Chronic obstructive pulmonary disease, unspecified: Secondary | ICD-10-CM

## 2013-03-02 DIAGNOSIS — J189 Pneumonia, unspecified organism: Secondary | ICD-10-CM

## 2013-03-02 DIAGNOSIS — J9610 Chronic respiratory failure, unspecified whether with hypoxia or hypercapnia: Secondary | ICD-10-CM

## 2013-03-02 NOTE — Patient Instructions (Addendum)
Finish your levaquin and taper off prednisone as planned  See calendar for specific medication instructions and bring it back for each and every office visit for every healthcare provider you see.  Without it,  you may not receive the best quality medical care that we feel you deserve.  You will note that the calendar groups together  your maintenance  medications that are timed at particular times of the day.  Think of this as your checklist for what your doctor has instructed you to do until your next evaluation to see what benefit  there is  to staying on a consistent group of medications intended to keep you well.  The other group at the bottom is entirely up to you to use as you see fit  for specific symptoms that may arise between visits that require you to treat them on an as needed basis.  Think of this as your action plan or "what if" list.   Separating the top medications from the bottom group is fundamental to providing you adequate care going forward.   Please remember to go to the lab and x-ray department downstairs for your tests - we will call you with the results when they are available.  See Tammy NP w/in 2 weeks with all your medications, even over the counter meds, separated in two separate bags, the ones you take no matter what vs the ones you stop once you feel better and take only as needed when you feel you need them.   Tammy  will generate for you a new user friendly medication calendar that will put [place] all on the same page re: your medication use.     Without this process, it simply isn't possible to assure that we are providing  your outpatient care  with  the attention to detail we feel you deserve.   If we cannot assure that you're getting that kind of care,  then we cannot manage your problem effectively from this clinic.  Once you have seen Tammy and we are sure that we're all on the same page with your medication use she will arrange follow up with me.    Late add:   Needs sinus CT next ov  if not better and Prevnar

## 2013-03-02 NOTE — Progress Notes (Signed)
Quick Note:  Spoke with pt and notified of results per Dr. Wert. Pt verbalized understanding and denied any questions.  ______ 

## 2013-03-02 NOTE — Progress Notes (Signed)
Subjective:     Patient ID: Katie Bryant, female   DOB: 13-Jun-1928    MRN: [id number]    Brief patient profile:  65 yowf quit smoking 1992 with GOLD III copd dx 2009 eval in pulm clinic p hospitalized at [facility]    History of Present Illness   Admit date: 12/21/2010  Discharge date: 12/24/2010  C O P D  ALLERGIC RHINITIS  DYSPNEA  G E R D  Aortic stenosis  Insomnia  Leukocytosis   01/12/13 followup and medication review. Patient returns for followup and medication review. We Reviewed all her medications and organized them into a medication calendar with patient education. It appears the patient is taking her medications correctly est med calendar - pt brought all meds with her today.  pt She does complains of some chest congestion, prod cough w/ thick clear mucus, increased DOE, chest tightness w/ coughing x3days.  denies f/c/s, wheezing, dyspnea at rest, nausea, vomiting rec Follow med calendar closely and bring to each visit.  Augmentin 875mg  Twice daily  For 7 days -take w/ food  Continue on current regimen    01/21/2013 post er f/u ov/Irish Piech re copd/ acute flare with nasal congestion , better p neb and one shot steroids in ER 01/18/13  Chief Complaint  Patient presents with  . Acute Visit    Pt c/o increased SOB and cough. Her cough is prod with green to yellow sputum.   very poor insight into action plans at bottom of med calendar for specific symptoms rec Change the chlorpheniramine (clor tabs) to take one every 4 hours as needed for drippy nose For stuffy nose ok to use sudafed otc For cough > delsym 2tsp every 12 hours and use flutter valve as much as you can  Prednisone 10 mg take  4 each am x 2 days,   2 each am x 2 days,  1 each am x 2 days and stop  If not improving the next step is sinus CT - call Libby at [phone] to schedule> did not do  Admit date: 02/25/2013  Discharge date: 02/26/2013  Time spent: 35 minutes  Recommendations for Outpatient Follow-up:   1. Follow up with PCP in 1-2 weeks Discharge Diagnoses:  Active Problems:  G E R D  Aortic stenosis  HTN (hypertension)  Chronic respiratory failure  COPD exacerbation  Pneumonia  PNA (pneumonia)    03/02/2013 post hosp f/u ov/Raiya Stainback re: copd/ ? pna > finishing levaquin/ pred taper  Chief Complaint  Patient presents with  . Follow-up    D/C from [facility] 02/26/2013. Semiproductive with white and green mucous. C/o DOE, weakness, decrease in appetite.     Still not following action plans at bottom of med calendar  No obvious patterns in day to day or daytime variabilty or assoc chronic   cp or chest tightness, subjective wheeze overt   hb symptoms. No unusual exp hx or h/o childhood pna/ asthma or knowledge of premature birth.  Sleeping ok without nocturnal  or early am exacerbation  of respiratory  c/o's or need for noct saba. Also denies any obvious fluctuation of symptoms with weather or environmental changes or other aggravating or alleviating factors except as outlined above   Current Medications, Allergies, Complete Past Medical History, Past Surgical History, Family History, and Social History were reviewed in [employer] record.  ROS  The following are not active complaints unless bolded sore throat, dysphagia, dental problems, itching, sneezing,  nasal congestion or  excess/ purulent secretions, ear ache,   fever, chills, sweats, unintended wt loss, pleuritic or exertional cp, hemoptysis,  orthopnea pnd or leg swelling, presyncope, palpitations, heartburn, abdominal pain, anorexia, nausea, vomiting, diarrhea  or change in bowel or urinary habits, change in stools or urine, dysuria,hematuria,  rash, arthralgias, visual complaints, headache, numbness weakness or ataxia or problems with walking or coordination,  change in mood/affect or memory.                Objective:  Physical Exam  anxious  amb wf nad speaking if full sentences  Wt 184 01/21/2011   >08/27/2011 186 > 180 09/29/2011 > 10/07/2011  183 > 11/11/2011  181 > 01/13/2012  183 >180 02/12/2012 > 06/09/2012 188 > 189 10/12/2012 >188 01/14/2013 > 01/21/2013 187 > 03/02/2013   HEENT mod non-specific turbinate edema.  Oropharynx no thrush or excess pnd or cobblestoning.  No JVD or cervical adenopathy. Mild accessory muscle hypertrophy. Trachea midline, nl thryroid. Chest was hyperinflated by percussion with diminished breath sounds and trace bilateral exp wheezing. Regular rate and rhythm without murmur gallop or rub or increase P2 or edema.  Abd: no hsm, nl excursion. Ext warm without cyanosis or clubbing.          CXR  03/02/2013 :  1. Stable interstitial prominence consistent with interstitial fibrosis. No acute pulmonary infiltrates. 2. Mild cardiomegaly with prominent central pulmonary arteries which taper. A component of pulmonary hypertension may be present. 3. Left mastectomy.       Assessment:

## 2013-03-02 NOTE — Assessment & Plan Note (Signed)
-   PFT's 06/11/2007  FEV1   0.70 (34%) ratio 32 with 49% improvement p B2    - HFA 75% effective p coaching 06/20/2011 > 50% 06/24/2011  -med calendar 02/03/11  > not using correctly 06/20/11 > not using at all 06/24/2011 , 07/08/2011, 08/12/2011 , 08/27/2011  > using it 09/29/2011 > not using correctly 01/13/12 -med calendar redo 02/12/2012 , 01/12/13   I had an extended discussion with the patient today lasting 15 to 20 minutes of a 25 minute visit on the following issues:    Each maintenance medication was reviewed in detail including most importantly the difference between maintenance and as needed and under what circumstances the prns are to be used. This was done in the context of a medication calendar review which provided the patient with a user-friendly unambiguous mechanism for medication administration and reconciliation and provides an action plan for all active problems. It is critical that this be shown to every doctor  for modification during the office visit if necessary so the patient can use it as a working document.

## 2013-03-02 NOTE — Assessment & Plan Note (Signed)
Resolved by cxr today  Needs prevnar next ov

## 2013-03-02 NOTE — Assessment & Plan Note (Signed)
-   sats 90% RA 03/11/2011     - Dr Annamaria Boots started 02 around 2010    - 3 lpm 24/7 except 4lpm with activity as of 03/02/2013   Adequate control on present rx, reviewed > no change in rx needed

## 2013-03-04 ENCOUNTER — Other Ambulatory Visit: Payer: Self-pay | Admitting: Family Medicine

## 2013-03-07 NOTE — Telephone Encounter (Signed)
Rx ready for nurse to Phone in. 

## 2013-03-07 NOTE — Telephone Encounter (Signed)
Called into [facility]

## 2013-03-08 ENCOUNTER — Other Ambulatory Visit: Payer: Self-pay | Admitting: Family Medicine

## 2013-03-08 ENCOUNTER — Ambulatory Visit: Payer: Medicare Other | Admitting: Family Medicine

## 2013-03-08 MED ORDER — ALPRAZOLAM 1 MG PO TABS: ORAL_TABLET | ORAL | Status: DC

## 2013-03-08 NOTE — Telephone Encounter (Signed)
Taken care of in RX

## 2013-03-08 NOTE — Telephone Encounter (Signed)
Rx ready for nurse to Phone in. 

## 2013-03-10 NOTE — Telephone Encounter (Signed)
Called in.

## 2013-03-11 ENCOUNTER — Encounter: Payer: Self-pay | Admitting: Nurse Practitioner

## 2013-03-11 ENCOUNTER — Ambulatory Visit (INDEPENDENT_AMBULATORY_CARE_PROVIDER_SITE_OTHER): Payer: Medicare Other | Admitting: Nurse Practitioner

## 2013-03-11 VITALS — BP 175/74 | HR 91 | Temp 98.5°F | Ht 64.0 in | Wt 186.0 lb

## 2013-03-11 DIAGNOSIS — K921 Melena: Secondary | ICD-10-CM

## 2013-03-11 NOTE — Progress Notes (Signed)
   Subjective:    Patient ID: Katie Bryant, female    DOB: 1928/09/19, 80 y.o.   MRN: [id number]  HPI Patient  has been recovering from being hospitaized for pneumonia- SHe got constipated- went to have a bowel movement this morning and had to strain to go and had bright red blood in the toilet- Patient has noticed no other blood since then. She has had hemorrhoids in the past with no flare ups until now.    Review of Systems  Constitutional: Negative.   HENT: Negative.   Respiratory: Positive for shortness of breath (no  more then usual).   Cardiovascular: Negative.   Gastrointestinal: Positive for blood in stool. Negative for rectal pain.  Genitourinary: Negative.   All other systems reviewed and are negative.       Objective:   Physical Exam  Constitutional: She appears well-developed and well-nourished.  Cardiovascular: Normal rate, regular rhythm and normal heart sounds.   Pulmonary/Chest: Effort normal and breath sounds normal.  Genitourinary:  No rectal exam performed  Skin: Skin is warm and dry.  Psychiatric: She has a normal mood and affect. Her behavior is normal. Judgment and thought content normal.   BP 175/74  Pulse 91  Temp(Src) 98.5 F (36.9 C) (Oral)  Ht 5\' 4"  (1.626 m)  Wt 186 lb (84.369 kg)  BMI 31.91 kg/m2        Assessment & Plan:   1. Blood in stool    Watch for more blood Stool softners RTO prn  Mary-Margaret Hassell Done, FNP

## 2013-03-11 NOTE — Patient Instructions (Signed)
Hemorrhoids Hemorrhoids are swollen veins around the rectum or anus. There are two types of hemorrhoids:   Internal hemorrhoids. These occur in the veins just inside the rectum. They may poke through to the outside and become irritated and painful.  External hemorrhoids. These occur in the veins outside the anus and can be felt as a painful swelling or hard lump near the anus. CAUSES  Pregnancy.   Obesity.   Constipation or diarrhea.   Straining to have a bowel movement.   Sitting for long periods on the toilet.  Heavy lifting or other activity that caused you to strain.  Anal intercourse. SYMPTOMS   Pain.   Anal itching or irritation.   Rectal bleeding.   Fecal leakage.   Anal swelling.   One or more lumps around the anus.  DIAGNOSIS  Your caregiver may be able to diagnose hemorrhoids by visual examination. Other examinations or tests that may be performed include:   Examination of the rectal area with a gloved hand (digital rectal exam).   Examination of anal canal using a small tube (scope).   A blood test if you have lost a significant amount of blood.  A test to look inside the colon (sigmoidoscopy or colonoscopy). TREATMENT Most hemorrhoids can be treated at home. However, if symptoms do not seem to be getting better or if you have a lot of rectal bleeding, your caregiver may perform a procedure to help make the hemorrhoids get smaller or remove them completely. Possible treatments include:   Placing a rubber band at the base of the hemorrhoid to cut off the circulation (rubber band ligation).   Injecting a chemical to shrink the hemorrhoid (sclerotherapy).   Using a tool to burn the hemorrhoid (infrared light therapy).   Surgically removing the hemorrhoid (hemorrhoidectomy).   Stapling the hemorrhoid to block blood flow to the tissue (hemorrhoid stapling).  HOME CARE INSTRUCTIONS   Eat foods with fiber, such as whole grains, beans,  nuts, fruits, and vegetables. Ask your doctor about taking products with added fiber in them (fibersupplements).  Increase fluid intake. Drink enough water and fluids to keep your urine clear or pale yellow.   Exercise regularly.   Go to the bathroom when you have the urge to have a bowel movement. Do not wait.   Avoid straining to have bowel movements.   Keep the anal area dry and clean. Use wet toilet paper or moist towelettes after a bowel movement.   Medicated creams and suppositories may be used or applied as directed.   Only take over-the-counter or prescription medicines as directed by your caregiver.   Take warm sitz baths for 15 20 minutes, 3 4 times a day to ease pain and discomfort.   Place ice packs on the hemorrhoids if they are tender and swollen. Using ice packs between sitz baths may be helpful.   Put ice in a plastic bag.   Place a towel between your skin and the bag.   Leave the ice on for 15 20 minutes, 3 4 times a day.   Do not use a donut-shaped pillow or sit on the toilet for long periods. This increases blood pooling and pain.  SEEK MEDICAL CARE IF:  You have increasing pain and swelling that is not controlled by treatment or medicine.  You have uncontrolled bleeding.  You have difficulty or you are unable to have a bowel movement.  You have pain or inflammation outside the area of the hemorrhoids. MAKE SURE YOU:    Understand these instructions.  Will watch your condition.  Will get help right away if you are not doing well or get worse. Document Released: 12/21/1999 Document Revised: 12/10/2011 Document Reviewed: 10/28/2011 ExitCare Patient Information 2014 [employer].  

## 2013-03-16 ENCOUNTER — Ambulatory Visit: Payer: Medicare Other | Admitting: Adult Health

## 2013-03-18 ENCOUNTER — Ambulatory Visit (INDEPENDENT_AMBULATORY_CARE_PROVIDER_SITE_OTHER): Payer: Medicare Other | Admitting: Adult Health

## 2013-03-18 ENCOUNTER — Encounter: Payer: Self-pay | Admitting: Adult Health

## 2013-03-18 VITALS — BP 140/68 | HR 86 | Temp 98.3°F | Ht 66.0 in | Wt 184.0 lb

## 2013-03-18 DIAGNOSIS — J9610 Chronic respiratory failure, unspecified whether with hypoxia or hypercapnia: Secondary | ICD-10-CM

## 2013-03-18 DIAGNOSIS — J449 Chronic obstructive pulmonary disease, unspecified: Secondary | ICD-10-CM

## 2013-03-18 NOTE — Assessment & Plan Note (Signed)
Order to dme for continuous flow only.

## 2013-03-18 NOTE — Addendum Note (Signed)
Addended by: Carlos American A on: 03/18/2013 04:27 PM   Modules accepted: Orders

## 2013-03-18 NOTE — Assessment & Plan Note (Signed)
Recent flare now resolving  Patient's medications were reviewed today and patient education was given. Computerized medication calendar was adjusted/completed

## 2013-03-18 NOTE — Patient Instructions (Signed)
Follow med calendar closely and bring to each visit.  Please wear Oxygen 3l/m continuous flow and 4/m with activity.  Continue on current regimen  Follow up Dr. [name]  In 3 months and As needed   Please contact office for sooner follow up if symptoms do not improve or worsen or seek emergency care

## 2013-03-18 NOTE — Progress Notes (Signed)
Subjective:     Patient ID: Katie Bryant, female   DOB: 1928-06-16    MRN: [id number] Brief patient profile:  17 yowf quit smoking 1992 with GOLD III copd dx 2009 eval in pulm clinic p hospitalized at [facility]    History of Present Illness   Admit date: 12/21/2010  Discharge date: 12/24/2010  C O P D  ALLERGIC RHINITIS  DYSPNEA  G E R D  Aortic stenosis  Insomnia  Leukocytosis   01/12/13 followup and medication review. Patient returns for followup and medication review. We Reviewed all her medications and organized them into a medication calendar with patient education. It appears the patient is taking her medications correctly est med calendar - pt brought all meds with her today.  pt She does complains of some chest congestion, prod cough w/ thick clear mucus, increased DOE, chest tightness w/ coughing x3days.  denies f/c/s, wheezing, dyspnea at rest, nausea, vomiting rec Follow med calendar closely and bring to each visit.  Augmentin 875mg  Twice daily  For 7 days -take w/ food  Continue on current regimen    01/21/2013 post er f/u ov/Wert re copd/ acute flare with nasal congestion , better p neb and one shot steroids in ER 01/18/13  Chief Complaint  Patient presents with  . Acute Visit    Pt c/o increased SOB and cough. Her cough is prod with green to yellow sputum.   very poor insight into action plans at bottom of med calendar for specific symptoms rec Change the chlorpheniramine (clor tabs) to take one every 4 hours as needed for drippy nose For stuffy nose ok to use sudafed otc For cough > delsym 2tsp every 12 hours and use flutter valve as much as you can  Prednisone 10 mg take  4 each am x 2 days,   2 each am x 2 days,  1 each am x 2 days and stop  If not improving the next step is sinus CT - call Libby at [phone] to schedule> did not do  Admit date: 02/25/2013  Discharge date: 02/26/2013  Time spent: 35 minutes  Recommendations for Outpatient Follow-up:  1. Follow  up with PCP in 1-2 weeks Discharge Diagnoses:  Active Problems:  G E R D  Aortic stenosis  HTN (hypertension)  Chronic respiratory failure  COPD exacerbation  Pneumonia  PNA (pneumonia)    03/02/2013 post hosp f/u ov/Wert re: copd/ ? pna > finishing levaquin/ pred taper  Chief Complaint  Patient presents with  . Follow-up    D/C from [facility] 02/26/2013. Semiproductive with white and green mucous. C/o DOE, weakness, decrease in appetite.     Still not following action plans at bottom of med calendar >>finish Levaquin/steroids , CXr >stable interstitial prominence   03/18/2013 Follow up  Returns for follow up for COPD Recent flare now resolving.  Feels cough and congestion are much better.  Sats were 78% on 3lpm pulsed when entering exam room. She is suppose to be on 3l continous flow and 4l/m with walking. We talked about continuous flow setting.  No chest pain , hemoptysis, orthopnea, edema or n/v.  We reviewed all her medications and organized them into a medication calendar with patient education. It appears that she is taking her medications. She does confused with maintenance versus as needed. Medications  Current Medications, Allergies, Complete Past Medical History, Past Surgical History, Family History, and Social History were reviewed in [employer] record.  ROS  The following  are not active complaints unless bolded sore throat, dysphagia, dental problems, itching, sneezing,  nasal congestion or excess/ purulent secretions, ear ache,   fever, chills, sweats, unintended wt loss, pleuritic or exertional cp, hemoptysis,  orthopnea pnd or leg swelling, presyncope, palpitations, heartburn, abdominal pain, anorexia, nausea, vomiting, diarrhea  or change in bowel or urinary habits, change in stools or urine, dysuria,hematuria,  rash, arthralgias, visual complaints, headache, numbness weakness or ataxia or problems with walking or coordination,  change in mood/affect  or memory.                Objective:  Physical Exam  anxious  amb wf nad speaking if full sentences  Wt 184 01/21/2011  >08/27/2011 186 > 180 09/29/2011 > 10/07/2011  183 > 11/11/2011  181 > 01/13/2012  183 >180 02/12/2012 > 06/09/2012 188 > 189 10/12/2012 >188 01/14/2013 > 01/21/2013 187 > 03/02/2013 >184 03/18/2013   HEENT mod non-specific turbinate edema.  Oropharynx no thrush or excess pnd or cobblestoning.  No JVD or cervical adenopathy. Mild accessory muscle hypertrophy. Trachea midline, nl thryroid. Chest was hyperinflated by percussion with diminished breath sound Regular rate and rhythm without murmur gallop or rub or increase P2 or edema.  Abd: no hsm, nl excursion. Ext warm without cyanosis or clubbing.          CXR  03/02/2013 :  1. Stable interstitial prominence consistent with interstitial fibrosis. No acute pulmonary infiltrates. 2. Mild cardiomegaly with prominent central pulmonary arteries which taper. A component of pulmonary hypertension may be present. 3. Left mastectomy.       Assessment:

## 2013-03-19 ENCOUNTER — Other Ambulatory Visit: Payer: Self-pay | Admitting: Cardiovascular Disease

## 2013-03-21 NOTE — Addendum Note (Signed)
Addended by: Maurice March on: 03/21/2013 12:43 PM   Modules accepted: Orders

## 2013-03-23 ENCOUNTER — Ambulatory Visit (INDEPENDENT_AMBULATORY_CARE_PROVIDER_SITE_OTHER): Payer: Medicare Other | Admitting: Cardiology

## 2013-03-23 ENCOUNTER — Encounter: Payer: Self-pay | Admitting: Cardiology

## 2013-03-23 VITALS — BP 138/62 | HR 72 | Ht 66.0 in | Wt 183.0 lb

## 2013-03-23 DIAGNOSIS — I350 Nonrheumatic aortic (valve) stenosis: Secondary | ICD-10-CM

## 2013-03-23 DIAGNOSIS — I359 Nonrheumatic aortic valve disorder, unspecified: Secondary | ICD-10-CM

## 2013-03-23 DIAGNOSIS — I10 Essential (primary) hypertension: Secondary | ICD-10-CM

## 2013-03-23 DIAGNOSIS — I6529 Occlusion and stenosis of unspecified carotid artery: Secondary | ICD-10-CM

## 2013-03-23 NOTE — Progress Notes (Signed)
HPI The patient presents for followup of aortic stenosis. This was mild.  She was in the hospital overnight for pneumonia last month. She is oxygen dependent. She is limited by this. Since her hospitalization she's also had some increased weakness.  The patient denies any new symptoms such as chest discomfort, neck or arm discomfort. There has been PND or orthopnea. There have been no reported palpitations, presyncope or syncope.  She wants to participate in some physical therapy.   Allergies  Allergen Reactions  . Other     STEROIDS, pt staes she blows up and gains weight, oral only    Current Outpatient Prescriptions  Medication Sig Dispense Refill  . acetaminophen (TYLENOL) 500 MG tablet Take 500 mg by mouth every 6 (six) hours as needed (pain.).       Marland Kitchen albuterol (PROVENTIL HFA;VENTOLIN HFA) 108 (90 BASE) MCG/ACT inhaler Inhale 2 puffs into the lungs every 4 (four) hours as needed for wheezing or shortness of breath.      . ALPRAZolam (XANAX) 1 MG tablet TAKE ONE TABLET BY MOUTH TWICE DAILY AS NEEDED  60 tablet  0  . aspirin 81 MG tablet Take 81 mg by mouth daily.      Marland Kitchen atorvastatin (LIPITOR) 40 MG tablet TAKE ONE TABLET BY MOUTH ONCE DAILY. PATIENT NEEDS LAB WORK.  30 tablet  4  . budesonide (PULMICORT) 0.5 MG/2ML nebulizer solution Take 0.5 mg by nebulization 2 (two) times daily.      . chlorpheniramine (CHLOR-TRIMETON) 4 MG tablet Take 4 mg by mouth. Take 2 tabs at bedtime. May add 1 extra every 6 hours as needed for drippy nose      . fenofibrate micronized (LOFIBRA) 134 MG capsule Take 1 capsule (134 mg total) by mouth daily before breakfast.  90 capsule  0  . ipratropium-albuterol (DUONEB) 0.5-2.5 (3) MG/3ML SOLN Take 3 mLs by nebulization 4 (four) times daily.  360 mL  11  . meclizine (ANTIVERT) 25 MG tablet Take 25 mg by mouth. 1 every 8 hours as needed.      . metoprolol tartrate (LOPRESSOR) 25 MG tablet TAKE ONE-HALF TABLET (12.5 MG) BY MOUTH TWICE DAILY  90 tablet  4  .  OXYGEN-HELIUM IN Wear 3 lpm continuously and increase to to 4 lpm as needed for shortness of breath w/ activity      . ranitidine (ZANTAC) 150 MG tablet Take 150 mg by mouth at bedtime.       . sodium chloride (OCEAN) 0.65 % SOLN nasal spray Place 2 sprays into both nostrils. Take every 4-6 hours as needed.      . valsartan-hydrochlorothiazide (DIOVAN-HCT) 80-12.5 MG per tablet Take 1 tablet by mouth daily.      Marland Kitchen dextromethorphan-guaiFENesin (MUCINEX DM) 30-600 MG per 12 hr tablet Take 1 tablet by mouth. Take 1-2 every 12 hours as needed with flutter valve.      Marland Kitchen Respiratory Therapy Supplies (FLUTTER) DEVI Use as directed  1 each  0   No current facility-administered medications for this visit.    Past Medical History  Diagnosis Date  . COPD (chronic obstructive pulmonary disease)   . Oxygen dependent   . Aortic stenosis, mild   . Obesity   . Hypertension   . Anxiety   . Back pain   . Headache(784.0)   . Breast cancer   . Asthma   . Sleep apnea   . DEMENTIA   . GERD (gastroesophageal reflux disease)   . Arthritis   .  Pneumonia   . H/O hiatal hernia   . Depression   . Lymphadenopathy 10/07/2012    Past Surgical History  Procedure Laterality Date  . Cardiac catheterization      EF 55-60%  . Tonsillectomy and adenoidectomy  1942  . Breast reconstruction    . Appendectomy    . Mastectomy  1980    ROS:  As stated in the HPI and negative for all other systems.  PHYSICAL EXAM BP 138/62  Pulse 72  Ht 5\' 6"  (1.676 m)  Wt 183 lb (83.008 kg)  BMI 29.55 kg/m2 GENERAL:  Well appearing NECK:  No jugular venous distention, waveform within normal limits, carotid upstroke brisk and symmetric, no bruits, no thyromegaly LUNGS:  Decreased breath sounds with no wheezing or crackles BACK:  No CVA tenderness HEART:  PMI not displaced or sustained,S1 and S2 within normal limits, no S3, no S4, no clicks, no rubs, 2/6 systolic murmur her best at the left upper sternal border,  Very  diminished heart sounds ABD:  Flat, positive bowel sounds normal in frequency in pitch, no bruits, no rebound, no guarding, no midline pulsatile mass, no hepatomegaly, no splenomegaly EXT:  2 plus pulses throughout, no edema, no cyanosis no clubbing  ASSESSMENT AND PLAN  Aortic stenosis - She had very mild AS in the past.  No further workup is suggested. No change in therapy is indicated.  Of note she has mild septal hypertrophy.   HTN (hypertension) -  Her blood pressure is well controlled and she will continue the meds as listed.  Carotid stenosis - She has moderate stenosis and is overdue for follow up.  I will arrange this.

## 2013-03-23 NOTE — Patient Instructions (Signed)

## 2013-03-28 ENCOUNTER — Ambulatory Visit (INDEPENDENT_AMBULATORY_CARE_PROVIDER_SITE_OTHER): Payer: Medicare Other | Admitting: Internal Medicine

## 2013-03-28 ENCOUNTER — Encounter: Payer: Self-pay | Admitting: Internal Medicine

## 2013-03-28 ENCOUNTER — Telehealth: Payer: Self-pay | Admitting: Internal Medicine

## 2013-03-28 VITALS — BP 146/84 | HR 90 | Temp 98.3°F | Ht 66.0 in | Wt 184.0 lb

## 2013-03-28 DIAGNOSIS — J449 Chronic obstructive pulmonary disease, unspecified: Secondary | ICD-10-CM

## 2013-03-28 DIAGNOSIS — J9610 Chronic respiratory failure, unspecified whether with hypoxia or hypercapnia: Secondary | ICD-10-CM

## 2013-03-28 NOTE — Telephone Encounter (Signed)
Called spoke with pt. She reports she is very SOB and O2 not helping at all. She requested to be seen today. MW appt made for pt

## 2013-03-28 NOTE — Progress Notes (Signed)
Subjective:     Patient ID: Katie Bryant, female   DOB: [birth year]    MRN: [id number]   Brief patient profile:  22 yowf quit smoking 1992 with GOLD III copd dx 2009 eval in pulm clinic p hospitalized at [facility]    History of Present Illness   Admit date: 12/21/2010  Discharge date: 12/24/2010  C O P D  ALLERGIC RHINITIS  DYSPNEA  G E R D  Aortic stenosis  Insomnia  Leukocytosis   01/12/13 followup and medication review. Patient returns for followup and medication review. We Reviewed all her medications and organized them into a medication calendar with patient education. It appears the patient is taking her medications correctly est med calendar - pt brought all meds with her today.  pt She does complains of some chest congestion, prod cough w/ thick clear mucus, increased DOE, chest tightness w/ coughing x3days.  denies f/c/s, wheezing, dyspnea at rest, nausea, vomiting rec Follow med calendar closely and bring to each visit.  Augmentin 875mg  Twice daily  For 7 days -take w/ food  Continue on current regimen    01/21/2013 post er f/u ov/Wert re copd/ acute flare with nasal congestion , better p neb and one shot steroids in ER 01/18/13  Chief Complaint  Patient presents with  . Acute Visit    Pt c/o increased SOB and cough. Her cough is prod with green to yellow sputum.   very poor insight into action plans at bottom of med calendar for specific symptoms rec Change the chlorpheniramine (clor tabs) to take one every 4 hours as needed for drippy nose For stuffy nose ok to use sudafed otc For cough > delsym 2tsp every 12 hours and use flutter valve as much as you can  Prednisone 10 mg take  4 each am x 2 days,   2 each am x 2 days,  1 each am x 2 days and stop  If not improving the next step is sinus CT - call Libby at [phone] to schedule> did not do  Admit date: 02/25/2013  Discharge date: 02/26/2013  Discharge Diagnoses:  Active Problems:  G E R D  Aortic stenosis  HTN  (hypertension)  Chronic respiratory failure  COPD exacerbation  Pneumonia  PNA (pneumonia)    03/02/2013 post hosp f/u ov/Wert re: copd/ ? pna > finishing levaquin/ pred taper  Chief Complaint  Patient presents with  . Follow-up    D/C from [facility] 02/26/2013. Semiproductive with white and green mucous. C/o DOE, weakness, decrease in appetite.     Still not following action plans at bottom of med calendar >>finish Levaquin/steroids , CXR >stable interstitial prominence   03/18/2013 Follow up  Returns for follow up for COPD Recent flare now resolving.  Feels cough and congestion are much better.  Sats were 78% on 3lpm pulsed when entering exam room. She is suppose to be on 3l continous flow and 4l/m with walking. We talked about continuous flow setting.  rec Follow med calendar closely and bring to each visit.  Please wear Oxygen 3l/m continuous flow and 4/m with activity.    03/28/2013  Acute extended ov/Wert re: chronic resp failure / copd GOLD III/ no med calendar Chief Complaint  Patient presents with  . Acute Visit    Pt c/o increased SOB for the past 2 days- with or without any exertion.   worse 2 d prior to OV  Vs day of ov,  not using 02 correctly though plainly  listed on med calendar as an automatic, not prn "med" hfa still quite poor and doesn't use prn alb neb at all.   No obvious day to day or daytime variabilty or assoc chronic cough or cp or chest tightness, subjective wheeze overt sinus or hb symptoms. No unusual exp hx or h/o childhood pna/ asthma or knowledge of premature birth.  Sleeping ok without nocturnal  or early am exacerbation  of respiratory  c/o's or need for noct saba. Also denies any obvious fluctuation of symptoms with weather or environmental changes or other aggravating or alleviating factors except as outlined above   Current Medications, Allergies, Complete Past Medical History, Past Surgical History, Family History, and Social History were reviewed in  [employer] record.  ROS  The following are not active complaints unless bolded sore throat, dysphagia, dental problems, itching, sneezing,  nasal congestion or excess/ purulent secretions, ear ache,   fever, chills, sweats, unintended wt loss, pleuritic or exertional cp, hemoptysis,  orthopnea pnd or leg swelling, presyncope, palpitations, heartburn, abdominal pain, anorexia, nausea, vomiting, diarrhea  or change in bowel or urinary habits, change in stools or urine, dysuria,hematuria,  rash, arthralgias, visual complaints, headache, numbness weakness or ataxia or problems with walking or coordination,  change in mood/affect or memory.                   Objective:  Physical Exam  anxious  amb wf nad speaking if full sentences  Wt 184 01/21/2011  >08/27/2011 186 > 180 09/29/2011 > 10/07/2011  183 > 11/11/2011  181 > 01/13/2012  183 >180 02/12/2012 > 06/09/2012 188 > 189 10/12/2012 >188 01/14/2013 > 01/21/2013 187 > 03/02/2013 >184 03/18/2013 >  03/28/2013 186   HEENT mod non-specific turbinate edema.  Oropharynx no thrush or excess pnd or cobblestoning.  No JVD or cervical adenopathy. Mild accessory muscle hypertrophy. Trachea midline, nl thryroid. Chest was hyperinflated by percussion with diminished breath sound Regular rate and rhythm without murmur gallop or rub or increase P2 or edema.  Abd: no hsm, nl excursion. Ext warm without cyanosis or clubbing.          CXR  03/02/2013 :  1. Stable interstitial prominence consistent with interstitial fibrosis. No acute pulmonary infiltrates. 2. Mild cardiomegaly with prominent central pulmonary arteries which taper. A component of pulmonary hypertension may be present. 3. Left mastectomy.       Assessment:

## 2013-03-28 NOTE — Assessment & Plan Note (Addendum)
-   PFT's 06/11/2007  FEV1   0.70 (34%) ratio 32 with 49% improvement p B2    - HFA 75% effective p coaching 06/20/2011 > 50% 06/24/2011  -med calendar 02/03/11  > not using correctly 06/20/11 > not using at all 06/24/2011 , 07/08/2011, 08/12/2011 , 08/27/2011  > using it 09/29/2011 > not using correctly 01/13/12 -med calendar redo 02/12/2012 , 01/12/13 , 03/18/2013   The proper method of use, as well as anticipated side effects, of a metered-dose inhaler are discussed and demonstrated to the patient. Improved effectiveness after extensive coaching during this visit to a level of approximately  75% from baseline of < 25%   Still struggling to use the med calendar correctly so each time she asked me a question re her care, I showed her how to use the med calendar to answer the question for her:    Each maintenance medication was reviewed in detail including most importantly the difference between maintenance and as needed and under what circumstances the prns are to be used. This was done in the context of a newly generated medication calendar review which provided the patient with a user-friendly unambiguous mechanism for medication administration and reconciliation and provides an action plan for all active problems. It is critical that this be shown to every doctor  for modification during the office visit if necessary so the patient can use it as a working document.

## 2013-03-28 NOTE — Assessment & Plan Note (Signed)
-   sats 90% RA 03/11/2011     - Dr Annamaria Boots started 02 around 2010    - 3 lpm 24/7 except 4lpm with activity as of 03/02/2013   Adequate control on present rx, reviewed > no change in rx needed  > reviewed rx again

## 2013-03-28 NOTE — Patient Instructions (Addendum)
02 is 3lpm 24/7 but 4lpm with activity   Only use your albuterol/salbuterol inhaler as a rescue medication to be used if you can't catch your breath by resting or doing a relaxed purse lip breathing pattern.  - The less you use it, the better it will work when you need it. - Ok to use up to 2 puffs  every 4 hours if you must but call for immediate appointment if use goes up over your usual need - Don't leave home without it !!  (think of it like the spare tire for your car)  - if not getting better from albterol inhaler then use the albuterol neb up to every 4 hours as needed as per your action plan on your med calendar  Keep previously scheduled appointment - call sooner if needed

## 2013-04-05 ENCOUNTER — Other Ambulatory Visit: Payer: Self-pay | Admitting: Internal Medicine

## 2013-04-12 ENCOUNTER — Other Ambulatory Visit: Payer: Self-pay | Admitting: Family Medicine

## 2013-04-26 ENCOUNTER — Ambulatory Visit (INDEPENDENT_AMBULATORY_CARE_PROVIDER_SITE_OTHER): Payer: Medicare Other | Admitting: Family Medicine

## 2013-04-26 ENCOUNTER — Encounter: Payer: Self-pay | Admitting: Family Medicine

## 2013-04-26 VITALS — BP 152/74 | HR 100 | Temp 100.0°F | Ht 66.0 in | Wt 186.2 lb

## 2013-04-26 DIAGNOSIS — S76311A Strain of muscle, fascia and tendon of the posterior muscle group at thigh level, right thigh, initial encounter: Secondary | ICD-10-CM | POA: Insufficient documentation

## 2013-04-26 DIAGNOSIS — I10 Essential (primary) hypertension: Secondary | ICD-10-CM

## 2013-04-26 DIAGNOSIS — E785 Hyperlipidemia, unspecified: Secondary | ICD-10-CM

## 2013-04-26 DIAGNOSIS — I359 Nonrheumatic aortic valve disorder, unspecified: Secondary | ICD-10-CM

## 2013-04-26 DIAGNOSIS — IMO0002 Reserved for concepts with insufficient information to code with codable children: Secondary | ICD-10-CM

## 2013-04-26 DIAGNOSIS — J4489 Other specified chronic obstructive pulmonary disease: Secondary | ICD-10-CM

## 2013-04-26 DIAGNOSIS — J9610 Chronic respiratory failure, unspecified whether with hypoxia or hypercapnia: Secondary | ICD-10-CM

## 2013-04-26 DIAGNOSIS — E669 Obesity, unspecified: Secondary | ICD-10-CM

## 2013-04-26 DIAGNOSIS — K219 Gastro-esophageal reflux disease without esophagitis: Secondary | ICD-10-CM

## 2013-04-26 DIAGNOSIS — J449 Chronic obstructive pulmonary disease, unspecified: Secondary | ICD-10-CM

## 2013-04-26 DIAGNOSIS — I350 Nonrheumatic aortic (valve) stenosis: Secondary | ICD-10-CM

## 2013-04-26 DIAGNOSIS — G4700 Insomnia, unspecified: Secondary | ICD-10-CM

## 2013-04-26 HISTORY — DX: Strain of muscle, fascia and tendon of the posterior muscle group at thigh level, right thigh, initial encounter: S76.311A

## 2013-04-26 NOTE — Progress Notes (Signed)
Patient ID: Katie Bryant, female   DOB: 14-Jul-1928, 80 y.o.   MRN: [id number] SUBJECTIVE: CC: Chief Complaint  Patient presents with  . Acute Visit    pain in rt leg 3 months got better then flared up and c/o pain back of leg all way down     HPI: Came to check the back of the right thigh. She was told by her companions/family to elevate her leg on a bar stool and when she did she had a bad cramp at the back of the proximal thigh in the bone in the buttocks.no neurologic  Deficits. Pain is all the way in the belly of the hamstring muscles down to the back of the knee.  Past Medical History  Diagnosis Date  . COPD (chronic obstructive pulmonary disease)   . Oxygen dependent   . Aortic stenosis, mild   . Obesity   . Hypertension   . Anxiety   . Back pain   . Headache(784.0)   . Breast cancer   . Asthma   . Sleep apnea   . DEMENTIA   . GERD (gastroesophageal reflux disease)   . Arthritis   . Pneumonia   . H/O hiatal hernia   . Depression   . Lymphadenopathy 10/07/2012  . Right hamstring muscle strain 04/26/2013   Past Surgical History  Procedure Laterality Date  . Cardiac catheterization      EF 55-60%  . Tonsillectomy and adenoidectomy  1942  . Breast reconstruction    . Appendectomy    . Mastectomy  1980   History   Social History  . Marital Status: Married    Spouse Name: N/A    Number of Children: N/A  . Years of Education: N/A   Occupational History  . Not on file.   Social History Main Topics  . Smoking status: Former Smoker -- 1.00 packs/day for 40 years    Types: Cigarettes    Quit date: 02/11/1990  . Smokeless tobacco: Never Used  . Alcohol Use: 4.2 oz/week    7 Glasses of wine per week  . Drug Use: No  . Sexual Activity: Not Currently   Other Topics Concern  . Not on file   Social History Narrative  . No narrative on file   Family History  Problem Relation Age of Onset  . Tuberculosis Mother   . Lung cancer Father    No current  outpatient prescriptions on file prior to visit.   No current facility-administered medications on file prior to visit.   Allergies  Allergen Reactions  . Other     STEROIDS, pt staes she blows up and gains weight, oral only   Immunization History  Administered Date(s) Administered  . Influenza Split 10/07/2010, 09/19/2012, 09/28/2012  . Influenza Whole 10/07/2011  . Pneumococcal Polysaccharide-23 01/06/2009   Prior to Admission medications   Medication Sig Start Date End Date Taking? Authorizing Provider  ALPRAZolam (XANAX) 1 MG tablet 1 mg 2 (two) times daily as needed.  04/05/13  Yes Historical Provider, MD  atorvastatin (LIPITOR) 40 MG tablet Take 40 mg by mouth daily at 6 PM.  04/12/13  Yes Historical Provider, MD  budesonide (PULMICORT) 0.5 MG/2ML nebulizer solution Take 0.5 mg by nebulization 2 (two) times daily.  04/18/13  Yes Historical Provider, MD  fenofibrate micronized (LOFIBRA) 134 MG capsule Take 134 mg by mouth daily before breakfast.  03/19/13  Yes Historical Provider, MD  ipratropium-albuterol (DUONEB) 0.5-2.5 (3) MG/3ML SOLN 3 mLs every 4 (four) hours  as needed.  04/18/13  Yes Historical Provider, MD  metoprolol tartrate (LOPRESSOR) 25 MG tablet Take 25 mg by mouth 2 (two) times daily.  03/21/13  Yes Historical Provider, MD  valsartan-hydrochlorothiazide (DIOVAN-HCT) 80-12.5 MG per tablet Take 1 tablet by mouth daily.  04/13/13  Yes Historical Provider, MD  VENTOLIN HFA 108 (90 BASE) MCG/ACT inhaler Inhale 1-2 puffs into the lungs every 4 (four) hours as needed.  01/27/13  Yes Historical Provider, MD  clobetasol cream (TEMOVATE) 0.53 % Apply 1 application topically 2 (two) times daily.  03/05/13   Historical Provider, MD  ESTRACE VAGINAL 0.1 MG/GM vaginal cream  04/12/13   Historical Provider, MD  terbinafine (LAMISIL) 250 MG tablet  01/28/13   Historical Provider, MD     ROS: As above in the HPI. All other systems are stable or negative.  OBJECTIVE: APPEARANCE:  Patient in  no acute distress.The patient appeared well nourished and normally developed. Acyanotic. Waist: VITAL SIGNS:BP 152/74  Pulse 100  Temp(Src) 100 F (37.8 C) (Oral)  Ht 5\' 6"  (1.676 m)  Wt 186 lb 3.2 oz (84.46 kg)  BMI 30.07 kg/m2  SpO2 90% WF on portable o2 via nasalcannula.  SKIN: warm and  Dry without overt rashes, tattoos and scars  HEAD and Neck: without JVD, Head and scalp: normal Eyes:No scleral icterus. Fundi normal, eye movements normal. Ears: Auricle normal, canal normal, Tympanic membranes normal, insufflation normal. Nose: normal Throat: normal Neck & thyroid: normal  CHEST & LUNGS: Chest wall: normal Lungs: coarse breath sounds.  CVS: Reveals the PMI to be normally located. Regular rhythm, First and Second Heart sounds are normal,  absence of murmurs, rubs or gallops. Peripheral vasculature: Radial pulses: normal  ABDOMEN:  Appearance: normal Benign, no organomegaly, no masses, no Abdominal Aortic enlargement. No Guarding , no rebound. No Bruits. Bowel sounds: normal  RECTAL: N/A GU: N/A  EXTREMETIES: nonedematous.  MUSCULOSKELETAL:  Spine: normal The proximal right hamstring is tender up to the insertion point. Straight leg raise produces pain in the right proximal hamstring only. Left SLR is normal.  NEUROLOGIC: oriented to time,place and person; nonfocal.   ASSESSMENT:  Right hamstring muscle strain - Plan: Ambulatory referral to Physical Therapy  OBESITY  Hyperlipidemia  HTN (hypertension)  COPD GOLD III  Chronic respiratory failure  Aortic stenosis  Insomnia  G E R D  PLAN: Patient would benefit from PT. Moist heat Ben gays to the area of muscle pain.  Orders Placed This Encounter  Procedures  . Ambulatory referral to Physical Therapy    Referral Priority:  Routine    Referral Type:  Physical Medicine    Referral Reason:  Specialty Services Required    Requested Specialty:  Physical Therapy    Number of Visits  Requested:  1   Meds ordered this encounter  Medications  . ESTRACE VAGINAL 0.1 MG/GM vaginal cream    Sig:   . VENTOLIN HFA 108 (90 BASE) MCG/ACT inhaler    Sig: Inhale 1-2 puffs into the lungs every 4 (four) hours as needed.   . ALPRAZolam (XANAX) 1 MG tablet    Sig: 1 mg 2 (two) times daily as needed.   Marland Kitchen atorvastatin (LIPITOR) 40 MG tablet    Sig: Take 40 mg by mouth daily at 6 PM.   . budesonide (PULMICORT) 0.5 MG/2ML nebulizer solution    Sig: Take 0.5 mg by nebulization 2 (two) times daily.   . clobetasol cream (TEMOVATE) 0.05 %    Sig: Apply 1 application  topically 2 (two) times daily.   . fenofibrate micronized (LOFIBRA) 134 MG capsule    Sig: Take 134 mg by mouth daily before breakfast.   . ipratropium-albuterol (DUONEB) 0.5-2.5 (3) MG/3ML SOLN    Sig: 3 mLs every 4 (four) hours as needed.   . metoprolol tartrate (LOPRESSOR) 25 MG tablet    Sig: Take 25 mg by mouth 2 (two) times daily.   Marland Kitchen DISCONTD: terbinafine (LAMISIL) 250 MG tablet    Sig:   . valsartan-hydrochlorothiazide (DIOVAN-HCT) 80-12.5 MG per tablet    Sig: Take 1 tablet by mouth daily.    Medications Discontinued During This Encounter  Medication Reason  . valsartan-hydrochlorothiazide (DIOVAN-HCT) 80-12.5 MG per tablet   . valsartan-hydrochlorothiazide (DIOVAN-HCT) 80-12.5 MG per tablet   . sodium chloride (OCEAN) 0.65 % SOLN nasal spray   . Respiratory Therapy Supplies (FLUTTER) DEVI   . ranitidine (ZANTAC) 150 MG tablet   . OXYGEN-HELIUM IN   . metoprolol tartrate (LOPRESSOR) 25 MG tablet   . meclizine (ANTIVERT) 25 MG tablet   . ipratropium-albuterol (DUONEB) 0.5-2.5 (3) MG/3ML SOLN   . fenofibrate micronized (LOFIBRA) 134 MG capsule   . dextromethorphan-guaiFENesin (MUCINEX DM) 30-600 MG per 12 hr tablet   . chlorpheniramine (CHLOR-TRIMETON) 4 MG tablet   . budesonide (PULMICORT) 0.5 MG/2ML nebulizer solution   . atorvastatin (LIPITOR) 40 MG tablet   . aspirin 81 MG tablet   . ALPRAZolam (XANAX)  1 MG tablet   . VENTOLIN HFA 108 (90 BASE) MCG/ACT inhaler   . albuterol (PROVENTIL HFA;VENTOLIN HFA) 108 (90 BASE) MCG/ACT inhaler   . acetaminophen (TYLENOL) 500 MG tablet   . terbinafine (LAMISIL) 250 MG tablet Completed Course   Return in about 2 months (around 06/26/2013) for Recheck medical problems.  Kjersten Ormiston P. Jacelyn Grip, M.D.

## 2013-05-03 ENCOUNTER — Ambulatory Visit: Payer: Medicare Other | Attending: Family Medicine | Admitting: Physical Therapy

## 2013-05-03 DIAGNOSIS — M25559 Pain in unspecified hip: Secondary | ICD-10-CM | POA: Insufficient documentation

## 2013-05-03 DIAGNOSIS — J449 Chronic obstructive pulmonary disease, unspecified: Secondary | ICD-10-CM | POA: Insufficient documentation

## 2013-05-03 DIAGNOSIS — J4489 Other specified chronic obstructive pulmonary disease: Secondary | ICD-10-CM | POA: Insufficient documentation

## 2013-05-03 DIAGNOSIS — X58XXXA Exposure to other specified factors, initial encounter: Secondary | ICD-10-CM | POA: Diagnosis not present

## 2013-05-03 DIAGNOSIS — IMO0001 Reserved for inherently not codable concepts without codable children: Secondary | ICD-10-CM | POA: Insufficient documentation

## 2013-05-03 DIAGNOSIS — R5381 Other malaise: Secondary | ICD-10-CM | POA: Insufficient documentation

## 2013-05-03 DIAGNOSIS — IMO0002 Reserved for concepts with insufficient information to code with codable children: Secondary | ICD-10-CM | POA: Diagnosis not present

## 2013-05-06 ENCOUNTER — Telehealth: Payer: Self-pay | Admitting: Internal Medicine

## 2013-05-06 DIAGNOSIS — J441 Chronic obstructive pulmonary disease with (acute) exacerbation: Secondary | ICD-10-CM

## 2013-05-06 NOTE — Telephone Encounter (Signed)
Called and spoke with pt and she stated that she went by the [facility] office yesterday and they told her that she would need to call our office and get [place] to send in a new order for a new nebulizer machine.  Pt is aware that this has been sent to [facility] to fax the order.  Nothing further is needed.

## 2013-05-10 ENCOUNTER — Ambulatory Visit (INDEPENDENT_AMBULATORY_CARE_PROVIDER_SITE_OTHER): Payer: Medicare Other | Admitting: Family Medicine

## 2013-05-10 ENCOUNTER — Encounter: Payer: Self-pay | Admitting: Family Medicine

## 2013-05-10 VITALS — BP 150/60 | HR 74 | Temp 97.7°F | Ht 66.0 in | Wt 184.4 lb

## 2013-05-10 DIAGNOSIS — J449 Chronic obstructive pulmonary disease, unspecified: Secondary | ICD-10-CM

## 2013-05-10 DIAGNOSIS — E669 Obesity, unspecified: Secondary | ICD-10-CM

## 2013-05-10 DIAGNOSIS — IMO0002 Reserved for concepts with insufficient information to code with codable children: Secondary | ICD-10-CM

## 2013-05-10 DIAGNOSIS — I350 Nonrheumatic aortic (valve) stenosis: Secondary | ICD-10-CM

## 2013-05-10 DIAGNOSIS — J9610 Chronic respiratory failure, unspecified whether with hypoxia or hypercapnia: Secondary | ICD-10-CM

## 2013-05-10 DIAGNOSIS — G4700 Insomnia, unspecified: Secondary | ICD-10-CM

## 2013-05-10 DIAGNOSIS — J4489 Other specified chronic obstructive pulmonary disease: Secondary | ICD-10-CM

## 2013-05-10 DIAGNOSIS — S76311A Strain of muscle, fascia and tendon of the posterior muscle group at thigh level, right thigh, initial encounter: Secondary | ICD-10-CM

## 2013-05-10 DIAGNOSIS — I10 Essential (primary) hypertension: Secondary | ICD-10-CM

## 2013-05-10 DIAGNOSIS — E785 Hyperlipidemia, unspecified: Secondary | ICD-10-CM

## 2013-05-10 DIAGNOSIS — K219 Gastro-esophageal reflux disease without esophagitis: Secondary | ICD-10-CM

## 2013-05-10 DIAGNOSIS — I359 Nonrheumatic aortic valve disorder, unspecified: Secondary | ICD-10-CM

## 2013-05-10 NOTE — Patient Instructions (Signed)
DASH Diet  The DASH diet stands for "Dietary Approaches to Stop Hypertension." It is a healthy eating plan that has been shown to reduce high blood pressure (hypertension) in as little as 14 days, while also possibly providing other significant health benefits. These other health benefits include reducing the risk of breast cancer after menopause and reducing the risk of type 2 diabetes, heart disease, colon cancer, and stroke. Health benefits also include weight loss and slowing kidney failure in patients with chronic kidney disease.   DIET GUIDELINES  · Limit salt (sodium). Your diet should contain less than 1500 mg of sodium daily.  · Limit refined or processed carbohydrates. Your diet should include mostly whole grains. Desserts and added sugars should be used sparingly.  · Include small amounts of heart-healthy fats. These types of fats include nuts, oils, and tub margarine. Limit saturated and trans fats. These fats have been shown to be harmful in the body.  CHOOSING FOODS   The following food groups are based on a 2000 calorie diet. See your Registered Dietitian for individual calorie needs.  Grains and Grain Products (6 to 8 servings daily)  · Eat More Often: Whole-wheat bread, brown rice, whole-grain or wheat pasta, quinoa, popcorn without added fat or salt (air popped).  · Eat Less Often: White bread, white pasta, white rice, cornbread.  Vegetables (4 to 5 servings daily)  · Eat More Often: Fresh, frozen, and canned vegetables. Vegetables may be raw, steamed, roasted, or grilled with a minimal amount of fat.  · Eat Less Often/Avoid: Creamed or fried vegetables. Vegetables in a cheese sauce.  Fruit (4 to 5 servings daily)  · Eat More Often: All fresh, canned (in natural juice), or frozen fruits. Dried fruits without added sugar. One hundred percent fruit juice (½ cup [237 mL] daily).  · Eat Less Often: Dried fruits with added sugar. Canned fruit in light or heavy syrup.  Lean Meats, Fish, and Poultry (2  servings or less daily. One serving is 3 to 4 oz [85-114 g]).  · Eat More Often: Ninety percent or leaner ground beef, tenderloin, sirloin. Round cuts of beef, chicken breast, turkey breast. All fish. Grill, bake, or broil your meat. Nothing should be fried.  · Eat Less Often/Avoid: Fatty cuts of meat, turkey, or chicken leg, thigh, or wing. Fried cuts of meat or fish.  Dairy (2 to 3 servings)  · Eat More Often: Low-fat or fat-free milk, low-fat plain or light yogurt, reduced-fat or part-skim cheese.  · Eat Less Often/Avoid: Milk (whole, 2%). Whole milk yogurt. Full-fat cheeses.  Nuts, Seeds, and Legumes (4 to 5 servings per week)  · Eat More Often: All without added salt.  · Eat Less Often/Avoid: Salted nuts and seeds, canned beans with added salt.  Fats and Sweets (limited)  · Eat More Often: Vegetable oils, tub margarines without trans fats, sugar-free gelatin. Mayonnaise and salad dressings.  · Eat Less Often/Avoid: Coconut oils, palm oils, butter, stick margarine, cream, half and half, cookies, candy, pie.  FOR MORE INFORMATION  The Dash Diet Eating Plan: www.dashdiet.org  Document Released: 12/12/2010 Document Revised: 03/17/2011 Document Reviewed: 12/12/2010  ExitCare® Patient Information ©2014 [employer].

## 2013-05-10 NOTE — Progress Notes (Signed)
Patient ID: Katie Bryant, female   DOB: 09-Dec-1928, 80 y.o.   MRN: [id number] SUBJECTIVE: CC: Chief Complaint  Patient presents with  . Hypertension  . Hyperlipidemia  . COPD    HPI: Patient is here for follow up of hyperlipidemia/HTN/COPD/chronic respiratory failure: denies Headache;denies Chest Pain;denies weakness   Shortness of Breath and orthopnea stable. Still has conflicts with her husband of 72 years. Coping otherwise.  ;denies Visual changes;denies palpitations;denies cough;denies pedal edema;denies symptoms of TIA or stroke;deniesClaudication symptoms. admits to Compliance with medications; denies Problems with medications.  The Oxygen concentrator is making funny noises and may not be  Working well.  The right hamstring is  Sore from PT. Will need to go back but the thigh is  So sore she is giving it a rest.  Past Medical History  Diagnosis Date  . COPD (chronic obstructive pulmonary disease)   . Oxygen dependent   . Aortic stenosis, mild   . Obesity   . Hypertension   . Anxiety   . Back pain   . Headache(784.0)   . Breast cancer   . Asthma   . Sleep apnea   . DEMENTIA   . GERD (gastroesophageal reflux disease)   . Arthritis   . Pneumonia   . H/O hiatal hernia   . Depression   . Lymphadenopathy 10/07/2012  . Right hamstring muscle strain 04/26/2013   Past Surgical History  Procedure Laterality Date  . Cardiac catheterization      EF 55-60%  . Tonsillectomy and adenoidectomy  1942  . Breast reconstruction    . Appendectomy    . Mastectomy  1980   History   Social History  . Marital Status: Married    Spouse Name: N/A    Number of Children: N/A  . Years of Education: N/A   Occupational History  . Not on file.   Social History Main Topics  . Smoking status: Former Smoker -- 1.00 packs/day for 40 years    Types: Cigarettes    Quit date: 02/11/1990  . Smokeless tobacco: Never Used  . Alcohol Use: 4.2 oz/week    7 Glasses of wine per week   . Drug Use: No  . Sexual Activity: Not Currently   Other Topics Concern  . Not on file   Social History Narrative  . No narrative on file   Family History  Problem Relation Age of Onset  . Tuberculosis Mother   . Lung cancer Father    Current Outpatient Prescriptions on File Prior to Visit  Medication Sig Dispense Refill  . ALPRAZolam (XANAX) 1 MG tablet 1 mg 2 (two) times daily as needed.       Marland Kitchen atorvastatin (LIPITOR) 40 MG tablet Take 40 mg by mouth daily at 6 PM.       . budesonide (PULMICORT) 0.5 MG/2ML nebulizer solution Take 0.5 mg by nebulization 2 (two) times daily.       . clobetasol cream (TEMOVATE) 0.62 % Apply 1 application topically 2 (two) times daily.       Marland Kitchen ESTRACE VAGINAL 0.1 MG/GM vaginal cream       . fenofibrate micronized (LOFIBRA) 134 MG capsule Take 134 mg by mouth daily before breakfast.       . ipratropium-albuterol (DUONEB) 0.5-2.5 (3) MG/3ML SOLN 3 mLs every 4 (four) hours as needed.       . metoprolol tartrate (LOPRESSOR) 25 MG tablet Take 25 mg by mouth 2 (two) times daily.       Marland Kitchen  valsartan-hydrochlorothiazide (DIOVAN-HCT) 80-12.5 MG per tablet Take 1 tablet by mouth daily.       . VENTOLIN HFA 108 (90 BASE) MCG/ACT inhaler Inhale 1-2 puffs into the lungs every 4 (four) hours as needed.        No current facility-administered medications on file prior to visit.   Allergies  Allergen Reactions  . Other     STEROIDS, pt staes she blows up and gains weight, oral only   Immunization History  Administered Date(s) Administered  . Influenza Split 10/07/2010, 09/19/2012, 09/28/2012  . Influenza Whole 10/07/2011  . Pneumococcal Polysaccharide-23 01/06/2009   Prior to Admission medications   Medication Sig Start Date End Date Taking? Authorizing Provider  ALPRAZolam (XANAX) 1 MG tablet 1 mg 2 (two) times daily as needed.  04/05/13  Yes Historical Provider, MD  atorvastatin (LIPITOR) 40 MG tablet Take 40 mg by mouth daily at 6 PM.  04/12/13  Yes  Historical Provider, MD  budesonide (PULMICORT) 0.5 MG/2ML nebulizer solution Take 0.5 mg by nebulization 2 (two) times daily.  04/18/13  Yes Historical Provider, MD  clobetasol cream (TEMOVATE) 1.61 % Apply 1 application topically 2 (two) times daily.  03/05/13  Yes Historical Provider, MD  ESTRACE VAGINAL 0.1 MG/GM vaginal cream  04/12/13  Yes Historical Provider, MD  fenofibrate micronized (LOFIBRA) 134 MG capsule Take 134 mg by mouth daily before breakfast.  03/19/13  Yes Historical Provider, MD  ipratropium-albuterol (DUONEB) 0.5-2.5 (3) MG/3ML SOLN 3 mLs every 4 (four) hours as needed.  04/18/13  Yes Historical Provider, MD  metoprolol tartrate (LOPRESSOR) 25 MG tablet Take 25 mg by mouth 2 (two) times daily.  03/21/13  Yes Historical Provider, MD  valsartan-hydrochlorothiazide (DIOVAN-HCT) 80-12.5 MG per tablet Take 1 tablet by mouth daily.  04/13/13  Yes Historical Provider, MD  VENTOLIN HFA 108 (90 BASE) MCG/ACT inhaler Inhale 1-2 puffs into the lungs every 4 (four) hours as needed.  01/27/13  Yes Historical Provider, MD     ROS: As above in the HPI. All other systems are stable or negative.  OBJECTIVE: APPEARANCE:  Patient in no acute distress.The patient appeared well nourished and normally developed. Acyanotic. Waist: VITAL SIGNS:BP 150/60  Pulse 74  Temp(Src) 97.7 F (36.5 C) (Oral)  Ht 5' 6"  (1.676 m)  Wt 184 lb 6.4 oz (83.643 kg)  BMI 29.78 kg/m2  SpO2 93% Overweight WF on Portable o2 via [place]. BP 145/68  SKIN: warm and  Dry without overt rashes, tattoos and scars  HEAD and Neck: without JVD, Head and scalp: normal Eyes:No scleral icterus. Fundi normal, eye movements normal. Ears: Auricle normal, canal normal, Tympanic membranes normal, insufflation normal. Nose: normal Throat: normal Neck & thyroid: normal  CHEST & LUNGS: Chest wall: normal Lungs: Coarse breath sounds and rhonchi.   CVS: Reveals the PMI to be normally located. Regular rhythm, First and Second Heart  sounds are normal,  absence of murmurs, rubs or gallops. Peripheral vasculature: Radial pulses: normal Dorsal pedis pulses: normal Posterior pulses: normal  ABDOMEN:  Appearance: normal Benign, no organomegaly, no masses, no Abdominal Aortic enlargement. No Guarding , no rebound. No Bruits. Bowel sounds: normal  RECTAL: N/A GU: N/A  EXTREMETIES: nonedematous.  MUSCULOSKELETAL:  Spine: abnormal limited ROM Joints: right hamstring tender to palpation especially at the proximal insertion.  NEUROLOGIC: oriented to time,place and person; nonfocal.  Results for orders placed during the hospital encounter of 02/25/13  CULTURE, EXPECTORATED SPUTUM-ASSESSMENT      Result Value Ref Range   Specimen Description  SPUTUM     Special Requests NONE     Sputum evaluation       Value: THIS SPECIMEN IS ACCEPTABLE. RESPIRATORY CULTURE REPORT TO FOLLOW.   Report Status 02/26/2013 FINAL    CULTURE, RESPIRATORY (NON-EXPECTORATED)      Result Value Ref Range   Specimen Description SPUTUM     Special Requests NONE     Gram Stain       Value: MODERATE WBC PRESENT,BOTH PMN AND MONONUCLEAR     NO SQUAMOUS EPITHELIAL CELLS SEEN     RARE GRAM POSITIVE COCCI IN PAIRS     Performed at [facility]   Culture       Value: NORMAL OROPHARYNGEAL FLORA     Performed at [facility]   Report Status 02/28/2013 FINAL    BASIC METABOLIC PANEL      Result Value Ref Range   Sodium 142  137 - 147 mEq/L   Potassium 3.5 (*) 3.7 - 5.3 mEq/L   Chloride 96  96 - 112 mEq/L   CO2 36 (*) 19 - 32 mEq/L   Glucose, Bld 179 (*) 70 - 99 mg/dL   BUN 16  6 - 23 mg/dL   Creatinine, Ser 0.72  0.50 - 1.10 mg/dL   Calcium 9.8  8.4 - 10.5 mg/dL   GFR calc non Af Amer 78 (*) >90 mL/min   GFR calc Af Amer >90  >90 mL/min  CBC      Result Value Ref Range   WBC 15.3 (*) 4.0 - 10.5 K/uL   RBC 3.88  3.87 - 5.11 MIL/uL   Hemoglobin 11.5 (*) 12.0 - 15.0 g/dL   HCT 37.2  36.0 - 46.0 %   MCV 95.9  78.0 - 100.0  fL   MCH 29.6  26.0 - 34.0 pg   MCHC 30.9  30.0 - 36.0 g/dL   RDW 12.8  11.5 - 15.5 %   Platelets 198  150 - 400 K/uL  TROPONIN I      Result Value Ref Range   Troponin I <0.30  <0.30 ng/mL  CBC      Result Value Ref Range   WBC 15.4 (*) 4.0 - 10.5 K/uL   RBC 3.78 (*) 3.87 - 5.11 MIL/uL   Hemoglobin 11.1 (*) 12.0 - 15.0 g/dL   HCT 36.1  36.0 - 46.0 %   MCV 95.5  78.0 - 100.0 fL   MCH 29.4  26.0 - 34.0 pg   MCHC 30.7  30.0 - 36.0 g/dL   RDW 12.8  11.5 - 15.5 %   Platelets 196  150 - 400 K/uL  BASIC METABOLIC PANEL      Result Value Ref Range   Sodium 142  137 - 147 mEq/L   Potassium 4.4  3.7 - 5.3 mEq/L   Chloride 99  96 - 112 mEq/L   CO2 33 (*) 19 - 32 mEq/L   Glucose, Bld 166 (*) 70 - 99 mg/dL   BUN 14  6 - 23 mg/dL   Creatinine, Ser 0.71  0.50 - 1.10 mg/dL   Calcium 9.3  8.4 - 10.5 mg/dL   GFR calc non Af Amer 78 (*) >90 mL/min   GFR calc Af Amer >90  >90 mL/min  CBC      Result Value Ref Range   WBC 14.5 (*) 4.0 - 10.5 K/uL   RBC 3.73 (*) 3.87 - 5.11 MIL/uL   Hemoglobin 10.8 (*) 12.0 - 15.0 g/dL  HCT 35.5 (*) 36.0 - 46.0 %   MCV 95.2  78.0 - 100.0 fL   MCH 29.0  26.0 - 34.0 pg   MCHC 30.4  30.0 - 36.0 g/dL   RDW 12.8  11.5 - 15.5 %   Platelets 183  150 - 400 K/uL  TROPONIN I      Result Value Ref Range   Troponin I <0.30  <0.30 ng/mL  TROPONIN I      Result Value Ref Range   Troponin I <0.30  <0.30 ng/mL    ASSESSMENT: OBESITY  Hyperlipidemia - Plan: NMR, lipoprofile  COPD GOLD III  HTN (hypertension) - Plan: CMP14+EGFR  Chronic respiratory failure  Aortic stenosis  G E R D  Insomnia  Right hamstring muscle strain  PLAN:  Dash diet in the AVS  Orders Placed This Encounter  Procedures  . CMP14+EGFR  . NMR, lipoprofile   No orders of the defined types were placed in this encounter.   There are no discontinued medications. Return in about 3 months (around 08/10/2013) for Recheck medical problems.  Levina Boyack P. Jacelyn Grip, M.D.

## 2013-05-11 LAB — CMP14+EGFR
ALT: 18 IU/L (ref 0–32)
AST: 30 IU/L (ref 0–40)
Albumin/Globulin Ratio: 2 (ref 1.1–2.5)
Albumin: 4.6 g/dL (ref 3.5–4.7)
Alkaline Phosphatase: 57 IU/L (ref 39–117)
BUN/Creatinine Ratio: 16 (ref 11–26)
BUN: 11 mg/dL (ref 8–27)
CO2: 35 mmol/L — ABNORMAL HIGH (ref 18–29)
Calcium: 9.9 mg/dL (ref 8.7–10.3)
Chloride: 96 mmol/L — ABNORMAL LOW (ref 97–108)
Creatinine, Ser: 0.7 mg/dL (ref 0.57–1.00)
GFR calc Af Amer: 93 mL/min/{1.73_m2} (ref >59)
GFR calc non Af Amer: 81 mL/min/{1.73_m2} (ref >59)
Globulin, Total: 2.3 g/dL (ref 1.5–4.5)
Glucose: 90 mg/dL (ref 65–99)
Potassium: 4.1 mmol/L (ref 3.5–5.2)
Sodium: 145 mmol/L — ABNORMAL HIGH (ref 134–144)
Total Bilirubin: 0.3 mg/dL (ref 0.0–1.2)
Total Protein: 6.9 g/dL (ref 6.0–8.5)

## 2013-05-11 LAB — NMR, LIPOPROFILE
Cholesterol: 163 mg/dL (ref <200)
HDL Cholesterol by NMR: 74 mg/dL (ref >=40)
HDL Particle Number: 44.1 umol/L (ref >=30.5)
LDL Particle Number: 1038 nmol/L — ABNORMAL HIGH (ref <1000)
LDL Size: 21.2 nm (ref >20.5)
LDLC SERPL CALC-MCNC: 64 mg/dL (ref <100)
LP-IR Score: 34 (ref <=45)
Small LDL Particle Number: 542 nmol/L — ABNORMAL HIGH (ref <=527)
Triglycerides by NMR: 125 mg/dL (ref <150)

## 2013-05-14 ENCOUNTER — Other Ambulatory Visit: Payer: Self-pay | Admitting: Family Medicine

## 2013-05-16 ENCOUNTER — Ambulatory Visit (HOSPITAL_COMMUNITY): Payer: Medicare Other | Attending: Cardiology | Admitting: Cardiology

## 2013-05-16 DIAGNOSIS — I6529 Occlusion and stenosis of unspecified carotid artery: Secondary | ICD-10-CM

## 2013-05-16 DIAGNOSIS — I779 Disorder of arteries and arterioles, unspecified: Secondary | ICD-10-CM | POA: Insufficient documentation

## 2013-05-16 NOTE — Progress Notes (Signed)
Carotid duplex performed 

## 2013-05-17 ENCOUNTER — Other Ambulatory Visit: Payer: Self-pay

## 2013-05-17 MED ORDER — ALPRAZOLAM 1 MG PO TABS: ORAL_TABLET | ORAL | Status: DC

## 2013-05-17 NOTE — Telephone Encounter (Signed)
This is okay to refill 

## 2013-05-17 NOTE — Telephone Encounter (Signed)
Last seen FPW 05/10/13  If approved route to nurse to call into [facility]

## 2013-05-17 NOTE — Telephone Encounter (Signed)
Last seen 05/10/13  FPW If approved route to nurse to call into [facility]

## 2013-05-17 NOTE — Telephone Encounter (Signed)
This is okay to call into the pharmacy, let patient know

## 2013-05-18 NOTE — Telephone Encounter (Signed)
Called in.

## 2013-06-06 ENCOUNTER — Ambulatory Visit (INDEPENDENT_AMBULATORY_CARE_PROVIDER_SITE_OTHER): Payer: Medicare Other | Admitting: Internal Medicine

## 2013-06-06 ENCOUNTER — Encounter: Payer: Self-pay | Admitting: Internal Medicine

## 2013-06-06 VITALS — BP 124/64 | HR 74 | Temp 98.5°F | Ht 64.0 in | Wt 182.0 lb

## 2013-06-06 DIAGNOSIS — J449 Chronic obstructive pulmonary disease, unspecified: Secondary | ICD-10-CM

## 2013-06-06 DIAGNOSIS — J9610 Chronic respiratory failure, unspecified whether with hypoxia or hypercapnia: Secondary | ICD-10-CM

## 2013-06-06 MED ORDER — BUDESONIDE 0.5 MG/2ML IN SUSP: 0.5000 mg | Freq: Two times a day (BID) | RESPIRATORY_TRACT | Status: DC

## 2013-06-06 MED ORDER — ALBUTEROL SULFATE (2.5 MG/3ML) 0.083% IN NEBU: 2.5000 mg | INHALATION_SOLUTION | Freq: Four times a day (QID) | RESPIRATORY_TRACT | Status: DC | PRN

## 2013-06-06 NOTE — Patient Instructions (Addendum)
See calendar for specific medication instructions and bring it back for each and every office visit for every healthcare provider you see.  Without it,  you may not receive the best quality medical care that we feel you deserve.  You will note that the calendar groups together  your maintenance  medications that are timed at particular times of the day.  Think of this as your checklist for what your doctor has instructed you to do until your next evaluation to see what benefit  there is  to staying on a consistent group of medications intended to keep you well.  The other group at the bottom is entirely up to you to use as you see fit  for specific symptoms that may arise between visits that require you to treat them on an as needed basis.  Think of this as your action plan or "what if" list.   Separating the top medications from the bottom group is fundamental to providing you adequate care going forward.   Please schedule a follow up office visit in 6 weeks, call sooner if needed in 6 weeks with all meds/ inhalers/ neb solutions/and pillboxes to double check and make sure your calendar, meds and our records all match up

## 2013-06-06 NOTE — Progress Notes (Signed)
Subjective:     Patient ID: Katie Bryant, female   DOB: [birth year]    MRN: [id number]   Brief patient profile:  48 yowf quit smoking 1992 with GOLD III copd dx 2009 eval in pulm clinic p hospitalized at [facility]    History of Present Illness   Admit date: 12/21/2010  Discharge date: 12/24/2010  C O P D  ALLERGIC RHINITIS  DYSPNEA  G E R D  Aortic stenosis  Insomnia  Leukocytosis   01/12/13 followup and medication review. Patient returns for followup and medication review. We Reviewed all her medications and organized them into a medication calendar with patient education. It appears the patient is taking her medications correctly est med calendar - pt brought all meds with her today.  pt She does complains of some chest congestion, prod cough w/ thick clear mucus, increased DOE, chest tightness w/ coughing x3days.  denies f/c/s, wheezing, dyspnea at rest, nausea, vomiting rec Follow med calendar closely and bring to each visit.  Augmentin 875mg  Twice daily  For 7 days -take w/ food  Continue on current regimen    01/21/2013 post er f/u ov/Wert re copd/ acute flare with nasal congestion , better p neb and one shot steroids in ER 01/18/13  Chief Complaint  Patient presents with  . Acute Visit    Pt c/o increased SOB and cough. Her cough is prod with green to yellow sputum.   very poor insight into action plans at bottom of med calendar for specific symptoms rec Change the chlorpheniramine (clor tabs) to take one every 4 hours as needed for drippy nose For stuffy nose ok to use sudafed otc For cough > delsym 2tsp every 12 hours and use flutter valve as much as you can  Prednisone 10 mg take  4 each am x 2 days,   2 each am x 2 days,  1 each am x 2 days and stop  If not improving the next step is sinus CT - call Libby at [phone] to schedule> did not do  Admit date: 02/25/2013  Discharge date: 02/26/2013  Discharge Diagnoses:  Active Problems:  G E R D  Aortic stenosis  HTN  (hypertension)  Chronic respiratory failure  COPD exacerbation  Pneumonia  PNA (pneumonia)    03/02/2013 post hosp f/u ov/Wert re: copd/ ? pna > finishing levaquin/ pred taper  Chief Complaint  Patient presents with  . Follow-up    D/C from [facility] 02/26/2013. Semiproductive with white and green mucous. C/o DOE, weakness, decrease in appetite.     Still not following action plans at bottom of med calendar >>finish Levaquin/steroids , CXR >stable interstitial prominence   03/18/2013 Follow up  Returns for follow up for COPD Recent flare now resolving.  Feels cough and congestion are much better.  Sats were 78% on 3lpm pulsed when entering exam room. She is suppose to be on 3l continous flow and 4l/m with walking. We talked about continuous flow setting.  rec Follow med calendar closely and bring to each visit.  Please wear Oxygen 3l/m continuous flow and 4/m with activity.    03/28/2013  Acute extended ov/Wert re: chronic resp failure / copd GOLD III/ no med calendar Chief Complaint  Patient presents with  . Acute Visit    Pt c/o increased SOB for the past 2 days- with or without any exertion.   worse 2 d prior to OV  Vs day of ov,  not using 02 correctly though plainly  listed on med calendar as an automatic, not prn "med" hfa still quite poor and doesn't use prn alb neb at all rec 02 is 3lpm 24/7 but 4lpm with activity  Only use your albuterol/salbuterol inhaler as a rescue medication   - if not getting better from albterol inhaler then use the albuterol neb up to every 4 hours   06/06/2013 f/u ov/Wert re:  COPD III/ 02 dep/ has med calendar but she and husband still not understanding it Chief Complaint  Patient presents with  . Follow-up    Pt reports her breathing is unchanged since last visit. No new co's today.    comes in with daughter who is concerned her mother is becoming more confused than usual with how to take her meds.  However, no change in doe on present rx with  duoneb qid, bud bid, and prn hfa ventolin (does not appear to have prn neb alb in per prn bag, though this had maint meds in it)   No obvious day to day or daytime variabilty or assoc chronic cough or cp or chest tightness, subjective wheeze overt sinus or hb symptoms. No unusual exp hx or h/o childhood pna/ asthma or knowledge of premature birth.  Sleeping ok without nocturnal  or early am exacerbation  of respiratory  c/o's or need for noct saba. Also denies any obvious fluctuation of symptoms with weather or environmental changes or other aggravating or alleviating factors except as outlined above   Current Medications, Allergies, Complete Past Medical History, Past Surgical History, Family History, and Social History were reviewed in [employer] record.  ROS  The following are not active complaints unless bolded sore throat, dysphagia, dental problems, itching, sneezing,  nasal congestion or excess/ purulent secretions, ear ache,   fever, chills, sweats, unintended wt loss, pleuritic or exertional cp, hemoptysis,  orthopnea pnd or leg swelling, presyncope, palpitations, heartburn, abdominal pain, anorexia, nausea, vomiting, diarrhea  or change in bowel or urinary habits, change in stools or urine, dysuria,hematuria,  rash, arthralgias, visual complaints, headache, numbness weakness or ataxia or problems with walking or coordination,  change in mood/affect or memory.                   Objective:  Physical Exam  anxious  amb wf nad  Easily confused with details of her care   Wt 184 01/21/2011  >08/27/2011 186 > 180 09/29/2011 > 10/07/2011  183 > 11/11/2011  181 > 01/13/2012  183 >180 02/12/2012 > 06/09/2012 188 > 189 10/12/2012 >188 01/14/2013 > 01/21/2013 187 > 03/02/2013 >184 03/18/2013 >  03/28/2013 186> 06/06/2013  182   HEENT mod non-specific turbinate edema.  Oropharynx no thrush or excess pnd or cobblestoning.  No JVD or cervical adenopathy. Mild accessory muscle hypertrophy.  Trachea midline, nl thryroid. Chest was hyperinflated by percussion with diminished breath sound Regular rate and rhythm without murmur gallop or rub or increase P2 or edema.  Abd: no hsm, nl excursion. Ext warm without cyanosis or clubbing.       CXR  03/02/2013 :  1. Stable interstitial prominence consistent with interstitial fibrosis. No acute pulmonary infiltrates. 2. Mild cardiomegaly with prominent central pulmonary arteries which taper. A component of pulmonary hypertension may be present. 3. Left mastectomy.   Assessment:

## 2013-06-08 ENCOUNTER — Encounter: Payer: Self-pay | Admitting: Internal Medicine

## 2013-06-08 NOTE — Assessment & Plan Note (Signed)
-   sats 90% RA 03/11/2011     - Dr Annamaria Boots started 02 around 2010    - 3 lpm 24/7 except 4lpm with activity as of 03/02/2013   Adequate control on present rx, reviewed > no change in rx needed

## 2013-06-08 NOTE — Assessment & Plan Note (Signed)
-   PFT's 06/11/2007  FEV1   0.70 (34%) ratio 32 with 49% improvement p B2    - HFA 75% effective p coaching 06/20/2011 > 50% 06/24/2011    - 03/28/2013 p extensive coaching HFA effectiveness =    75%  Severe and 02 dep but not steroid dep and actually relatively well compensated when she takes her meds correctly  I had an extended discussion with the patient and daughter today lasting 15 to 20 minutes of a 25 minute visit on the following issues:    Each maintenance medication was reviewed in detail including most importantly the difference between maintenance and as needed and under what circumstances the prns are to be used. This was done in the context of a medication calendar review which provided the patient with a user-friendly unambiguous mechanism for medication administration and reconciliation and provides an action plan for all active problems. It is critical that this be shown to every doctor  for modification during the office visit if necessary so the patient can use it as a working document.

## 2013-06-09 ENCOUNTER — Other Ambulatory Visit: Payer: Self-pay | Admitting: Internal Medicine

## 2013-06-17 ENCOUNTER — Other Ambulatory Visit: Payer: Self-pay | Admitting: Family Medicine

## 2013-06-20 NOTE — Telephone Encounter (Signed)
Dr. Jacelyn Grip patient. Last ov 5/15. Last refill 05/18/13. Call to [facility] if approved. Route to pool A.

## 2013-06-20 NOTE — Telephone Encounter (Signed)
Left message on [facility] Voicemail of rx refill

## 2013-06-20 NOTE — Telephone Encounter (Signed)
This is okay to refill 

## 2013-06-24 NOTE — Telephone Encounter (Signed)
done

## 2013-07-03 ENCOUNTER — Other Ambulatory Visit: Payer: Self-pay | Admitting: Cardiology

## 2013-07-15 ENCOUNTER — Other Ambulatory Visit: Payer: Self-pay | Admitting: Family Medicine

## 2013-07-16 NOTE — Telephone Encounter (Signed)
Please call in xanax with 1 refills 

## 2013-07-16 NOTE — Telephone Encounter (Signed)
Last filled 06/20/2013

## 2013-07-16 NOTE — Telephone Encounter (Signed)
rx called into pharmacy

## 2013-07-18 ENCOUNTER — Other Ambulatory Visit: Payer: Self-pay | Admitting: *Deleted

## 2013-07-18 ENCOUNTER — Ambulatory Visit (INDEPENDENT_AMBULATORY_CARE_PROVIDER_SITE_OTHER): Payer: Medicare Other | Admitting: Internal Medicine

## 2013-07-18 ENCOUNTER — Encounter: Payer: Self-pay | Admitting: Internal Medicine

## 2013-07-18 VITALS — BP 170/92 | HR 73 | Temp 98.1°F | Ht 65.0 in | Wt 179.0 lb

## 2013-07-18 DIAGNOSIS — J449 Chronic obstructive pulmonary disease, unspecified: Secondary | ICD-10-CM

## 2013-07-18 DIAGNOSIS — J9612 Chronic respiratory failure with hypercapnia: Secondary | ICD-10-CM

## 2013-07-18 DIAGNOSIS — J9610 Chronic respiratory failure, unspecified whether with hypoxia or hypercapnia: Secondary | ICD-10-CM

## 2013-07-18 NOTE — Patient Instructions (Signed)
See Tammy NP 3 months  with all your medications, even over the counter meds, separated in two separate bags, the ones you take no matter what vs the ones you stop once you feel better and take only as needed when you feel you need them.   Tammy  will generate for you a new user friendly medication calendar that will put [place] all on the same page re: your medication use.     Without this process, it simply isn't possible to assure that we are providing  your outpatient care  with  the attention to detail we feel you deserve.   If we cannot assure that you're getting that kind of care,  then we cannot manage your problem effectively from this clinic.  Once you have seen Tammy and we are sure that we're all on the same page with your medication use she will arrange follow up with me.

## 2013-07-18 NOTE — Progress Notes (Signed)
Subjective:     Patient ID: Katie Bryant, female   DOB: 16-Oct-1928    MRN: [id number]   Brief patient profile:  53 yowf quit smoking 1992 with GOLD III copd dx 2009 eval in pulm clinic p hospitalized at [facility]    History of Present Illness   Admit date: 12/21/2010  Discharge date: 12/24/2010  C O P D  ALLERGIC RHINITIS  DYSPNEA  G E R D  Aortic stenosis  Insomnia  Leukocytosis   01/12/13 followup and medication review. Patient returns for followup and medication review. We Reviewed all her medications and organized them into a medication calendar with patient education. It appears the patient is taking her medications correctly est med calendar - pt brought all meds with her today.  pt She does complains of some chest congestion, prod cough w/ thick clear mucus, increased DOE, chest tightness w/ coughing x3days.  denies f/c/s, wheezing, dyspnea at rest, nausea, vomiting rec Follow med calendar closely and bring to each visit.  Augmentin 875mg  Twice daily  For 7 days -take w/ food  Continue on current regimen    01/21/2013 post er f/u ov/Katie Bryant re copd/ acute flare with nasal congestion , better p neb and one shot steroids in ER 01/18/13  Chief Complaint  Patient presents with  . Acute Visit    Pt c/o increased SOB and cough. Her cough is prod with green to yellow sputum.   very poor insight into action plans at bottom of med calendar for specific symptoms rec Change the chlorpheniramine (clor tabs) to take one every 4 hours as needed for drippy nose For stuffy nose ok to use sudafed otc For cough > delsym 2tsp every 12 hours and use flutter valve as much as you can  Prednisone 10 mg take  4 each am x 2 days,   2 each am x 2 days,  1 each am x 2 days and stop  If not improving the next step is sinus CT - call Katie Bryant at [phone] to schedule> did not do  Admit date: 02/25/2013  Discharge date: 02/26/2013  Discharge Diagnoses:  Active Problems:  G E R D  Aortic stenosis  HTN  (hypertension)  Chronic respiratory failure  COPD exacerbation  Pneumonia  PNA (pneumonia)    03/02/2013 post hosp f/u ov/Katie Bryant re: copd/ ? pna > finishing levaquin/ pred taper  Chief Complaint  Patient presents with  . Follow-up    D/C from [facility] 02/26/2013. Semiproductive with white and green mucous. C/o DOE, weakness, decrease in appetite.     Still not following action plans at bottom of med calendar >>finish Levaquin/steroids , CXR >stable interstitial prominence   03/18/2013 Follow up  Returns for follow up for COPD Recent flare now resolving.  Feels cough and congestion are much better.  Sats were 78% on 3lpm pulsed when entering exam room. She is suppose to be on 3l continous flow and 4l/m with walking. We talked about continuous flow setting.  rec Follow med calendar closely and bring to each visit.  Please wear Oxygen 3l/m continuous flow and 4/m with activity.    03/28/2013  Acute extended ov/Katie Bryant re: chronic resp failure / copd GOLD III/ no med calendar Chief Complaint  Patient presents with  . Acute Visit    Pt c/o increased SOB for the past 2 days- with or without any exertion.   worse 2 d prior to OV  Vs day of ov,  not using 02 correctly though plainly  listed on med calendar as an automatic, not prn "med" hfa still quite poor and doesn't use prn alb neb at all rec 02 is 3lpm 24/7 but 4lpm with activity  Only use your albuterol/salbuterol inhaler as a rescue medication   - if not getting better from albterol inhaler then use the albuterol neb up to every 4 hours   06/06/2013 f/u ov/Katie Bryant re:  COPD III/ 02 dep/ has med calendar but she and husband still not understanding it Chief Complaint  Patient presents with  . Follow-up    Pt reports her breathing is unchanged since last visit. No new co's today.    comes in with daughter who is concerned her mother is becoming more confused than usual with how to take her meds.  However, no change in doe on present rx with  duoneb qid, bud bid, and prn hfa ventolin (does not appear to have prn neb alb in per prn bag, though this had maint meds in it) rec No change rx, use med calendar  07/18/2013 f/u ov/Katie Bryant re: GOLD III/ 02 dep / finally using med calendar approp/ on duoneb/bud bid plus prn duoneb in between Chief Complaint  Patient presents with  . Follow-up    Pt c/o DOE. Pt states overall she feels well, pt states she feels better from last OV. Denies CP/tightness and cough.   back to baseline doe on 02 can walk slow pace x sev hundred feet    No obvious day to day or daytime variabilty or assoc chronic cough or cp or chest tightness, subjective wheeze overt sinus or hb symptoms. No unusual exp hx or h/o childhood pna/ asthma or knowledge of premature birth.  Sleeping ok without nocturnal  or early am exacerbation  of respiratory  c/o's or need for noct saba. Also denies any obvious fluctuation of symptoms with weather or environmental changes or other aggravating or alleviating factors except as outlined above   Current Medications, Allergies, Complete Past Medical History, Past Surgical History, Family History, and Social History were reviewed in [employer] record.  ROS  The following are not active complaints unless bolded sore throat, dysphagia, dental problems, itching, sneezing,  nasal congestion or excess/ purulent secretions, ear ache,   fever, chills, sweats, unintended wt loss, pleuritic or exertional cp, hemoptysis,  orthopnea pnd or leg swelling, presyncope, palpitations, heartburn, abdominal pain, anorexia, nausea, vomiting, diarrhea  or change in bowel or urinary habits, change in stools or urine, dysuria,hematuria,  rash, arthralgias, visual complaints, headache, numbness weakness or ataxia or problems with walking or coordination,  change in mood/affect or memory.                   Objective:  Physical Exam  anxious  amb wf nad  Easily confused with details of her  care   Wt 184 01/21/2011  >08/27/2011 186 > 180 09/29/2011 > 10/07/2011  183 > 11/11/2011  181 > 01/13/2012  183 >180 02/12/2012 > 06/09/2012 188 > 189 10/12/2012 >188 01/14/2013 > 01/21/2013 187 > 03/02/2013 >184 03/18/2013 >  03/28/2013 186> 06/06/2013  182 >  07/18/2013  179   HEENT mod non-specific turbinate edema.  Oropharynx no thrush or excess pnd or cobblestoning.  No JVD or cervical adenopathy. Mild accessory muscle hypertrophy. Trachea midline, nl thryroid. Chest was hyperinflated by percussion with diminished breath sound Regular rate and rhythm without murmur gallop or rub or increase P2 or edema.  Abd: no hsm, nl excursion. Ext warm without cyanosis  or clubbing.       CXR  03/02/2013 :  1. Stable interstitial prominence consistent with interstitial fibrosis. No acute pulmonary infiltrates. 2. Mild cardiomegaly with prominent central pulmonary arteries which taper. A component of pulmonary hypertension may be present. 3. Left mastectomy.   Assessment:

## 2013-07-19 NOTE — Assessment & Plan Note (Signed)
-   sats 90% RA 03/11/2011     - Dr Annamaria Boots started 02 around 2010    - 3 lpm 24/7 except 4lpm with activity as of 03/02/2013     - HC03 trending in low 30's since 2012 so likely hypercarbic component as well  Adequate control on present rx, reviewed > no change in rx needed

## 2013-07-19 NOTE — Assessment & Plan Note (Signed)
-   PFT's 06/11/2007  FEV1   0.70 (34%) ratio 32 with 49% improvement p B2    - HFA 75% effective p coaching 06/20/2011 > 50% 06/24/2011   Finally turning the corner with her daughters' help (one of whom is pharmacy tech).  Both present today  I had an extended discussion with them  lasting 15 to 20 minutes of a 25 minute visit on the following issues:     Each maintenance medication was reviewed in detail including most importantly the difference between maintenance and as needed and under what circumstances the prns are to be used. This was done in the context of a medication calendar review which provided the patient with a user-friendly unambiguous mechanism for medication administration and reconciliation and provides an action plan for all active problems. It is critical that this be shown to every doctor  for modification during the office visit if necessary so the patient can use it as a working document.

## 2013-07-22 ENCOUNTER — Other Ambulatory Visit: Payer: Self-pay

## 2013-07-22 MED ORDER — VALSARTAN-HYDROCHLOROTHIAZIDE 80-12.5 MG PO TABS: 1.0000 | ORAL_TABLET | Freq: Every day | ORAL | Status: DC

## 2013-09-05 ENCOUNTER — Other Ambulatory Visit: Payer: Self-pay | Admitting: Family Medicine

## 2013-09-08 ENCOUNTER — Other Ambulatory Visit: Payer: Self-pay | Admitting: Nurse Practitioner

## 2013-09-14 ENCOUNTER — Telehealth: Payer: Self-pay | Admitting: Nurse Practitioner

## 2013-09-14 NOTE — Telephone Encounter (Signed)
Pt stated she has a sore on her chin half the size of a penny. No drainage per pt. Pt advised no appt available for today and will need to go to ER if ntbs today. Pt refused and appt given for tom. Advised again to go to ER if s/s become worse. Pt verbalized understanding.

## 2013-09-15 ENCOUNTER — Ambulatory Visit (INDEPENDENT_AMBULATORY_CARE_PROVIDER_SITE_OTHER): Payer: Medicare Other | Admitting: Family Medicine

## 2013-09-15 ENCOUNTER — Encounter: Payer: Self-pay | Admitting: Family Medicine

## 2013-09-15 VITALS — BP 125/53 | HR 93 | Temp 97.8°F | Ht 66.0 in | Wt 178.2 lb

## 2013-09-15 DIAGNOSIS — L0293 Carbuncle, unspecified: Secondary | ICD-10-CM

## 2013-09-15 DIAGNOSIS — L0292 Furuncle, unspecified: Secondary | ICD-10-CM

## 2013-09-15 MED ORDER — DOXYCYCLINE HYCLATE 100 MG PO TABS: 100.0000 mg | ORAL_TABLET | Freq: Two times a day (BID) | ORAL | Status: DC

## 2013-09-15 NOTE — Progress Notes (Signed)
   Subjective:    Patient ID: Katie Bryant, female    DOB: March 03, 1928, 80 y.o.   MRN: [id number]  HPI This 80 y.o. female presents for evaluation of furunculosis on chin.  She has a hx of abscess  On her chin and she is worried about getting another one so she came in for check.   Review of Systems No chest pain, SOB, HA, dizziness, vision change, N/V, diarrhea, constipation, dysuria, urinary urgency or frequency, myalgias, arthralgias or rash.     Objective:   Physical Exam Skin - furunculosis on chin       Assessment & Plan:  Furunculosis - Plan: doxycycline (VIBRA-TABS) 100 MG tablet Bid x 10 days.  Apply warm compress. Lysbeth Penner FNP

## 2013-09-16 NOTE — Telephone Encounter (Signed)
Last filled 08/19/13, saw you yesterday. Route to pool to call into [facility]

## 2013-09-19 ENCOUNTER — Other Ambulatory Visit: Payer: Self-pay | Admitting: Internal Medicine

## 2013-09-19 ENCOUNTER — Other Ambulatory Visit: Payer: Self-pay | Admitting: Family Medicine

## 2013-09-19 ENCOUNTER — Encounter: Payer: Medicare Other | Admitting: Adult Health

## 2013-09-19 ENCOUNTER — Telehealth: Payer: Self-pay | Admitting: *Deleted

## 2013-09-19 ENCOUNTER — Encounter: Payer: Self-pay | Admitting: *Deleted

## 2013-09-19 MED ORDER — ALPRAZOLAM 1 MG PO TABS: 1.0000 mg | ORAL_TABLET | Freq: Two times a day (BID) | ORAL | Status: DC | PRN

## 2013-09-19 NOTE — Telephone Encounter (Signed)
This encounter was created in error - please disregard.

## 2013-09-19 NOTE — Telephone Encounter (Signed)
Rx for Xanax called into [facility] Okayed by [employer] Pt notified

## 2013-09-27 ENCOUNTER — Encounter (INDEPENDENT_AMBULATORY_CARE_PROVIDER_SITE_OTHER): Payer: Self-pay

## 2013-09-27 ENCOUNTER — Encounter: Payer: Self-pay | Admitting: Adult Health

## 2013-09-27 ENCOUNTER — Ambulatory Visit (INDEPENDENT_AMBULATORY_CARE_PROVIDER_SITE_OTHER): Payer: Medicare Other | Admitting: Adult Health

## 2013-09-27 VITALS — BP 146/68 | HR 99 | Temp 98.0°F | Ht 66.0 in | Wt 181.0 lb

## 2013-09-27 DIAGNOSIS — J449 Chronic obstructive pulmonary disease, unspecified: Secondary | ICD-10-CM

## 2013-09-27 DIAGNOSIS — J9612 Chronic respiratory failure with hypercapnia: Secondary | ICD-10-CM

## 2013-09-27 DIAGNOSIS — J9610 Chronic respiratory failure, unspecified whether with hypoxia or hypercapnia: Secondary | ICD-10-CM

## 2013-09-27 NOTE — Assessment & Plan Note (Signed)
Cont on O2 .  

## 2013-09-27 NOTE — Patient Instructions (Addendum)
Follow med calendar closely and bring to each visit.  Continue on current regimen  Follow up Dr. [name]  In 3 months and As needed   Please contact office for sooner follow up if symptoms do not improve or worsen or seek emergency care

## 2013-09-27 NOTE — Assessment & Plan Note (Addendum)
Compensated on present regimen  Patient's medications were reviewed today and patient education was given. Computerized medication calendar was adjusted/completed   Plan  Follow med calendar closely and bring to each visit.  Continue on current regimen  Follow up Dr. [name]  In 3 months and As needed   Please contact office for sooner follow up if symptoms do not improve or worsen or seek emergency care

## 2013-09-27 NOTE — Progress Notes (Signed)
Subjective:     Patient ID: Katie Bryant, female   DOB: [birth year]    MRN: [id number]   Brief patient profile:  48 yowf quit smoking 1992 with GOLD III copd dx 2009 eval in pulm clinic p hospitalized at [facility]    History of Present Illness   Admit date: 12/21/2010  Discharge date: 12/24/2010  C O P D  ALLERGIC RHINITIS  DYSPNEA  G E R D  Aortic stenosis  Insomnia  Leukocytosis   01/12/13 followup and medication review. Patient returns for followup and medication review. We Reviewed all her medications and organized them into a medication calendar with patient education. It appears the patient is taking her medications correctly est med calendar - pt brought all meds with her today.  pt She does complains of some chest congestion, prod cough w/ thick clear mucus, increased DOE, chest tightness w/ coughing x3days.  denies f/c/s, wheezing, dyspnea at rest, nausea, vomiting rec Follow med calendar closely and bring to each visit.  Augmentin 875mg  Twice daily  For 7 days -take w/ food  Continue on current regimen    01/21/2013 post er f/u ov/Wert re copd/ acute flare with nasal congestion , better p neb and one shot steroids in ER 01/18/13  Chief Complaint  Patient presents with  . Acute Visit    Pt c/o increased SOB and cough. Her cough is prod with green to yellow sputum.   very poor insight into action plans at bottom of med calendar for specific symptoms rec Change the chlorpheniramine (clor tabs) to take one every 4 hours as needed for drippy nose For stuffy nose ok to use sudafed otc For cough > delsym 2tsp every 12 hours and use flutter valve as much as you can  Prednisone 10 mg take  4 each am x 2 days,   2 each am x 2 days,  1 each am x 2 days and stop  If not improving the next step is sinus CT - call Libby at [phone] to schedule> did not do  Admit date: 02/25/2013  Discharge date: 02/26/2013  Discharge Diagnoses:  Active Problems:  G E R D  Aortic stenosis  HTN  (hypertension)  Chronic respiratory failure  COPD exacerbation  Pneumonia  PNA (pneumonia)    03/02/2013 post hosp f/u ov/Wert re: copd/ ? pna > finishing levaquin/ pred taper  Chief Complaint  Patient presents with  . Follow-up    D/C from [facility] 02/26/2013. Semiproductive with white and green mucous. C/o DOE, weakness, decrease in appetite.     Still not following action plans at bottom of med calendar >>finish Levaquin/steroids , CXR >stable interstitial prominence   03/18/2013 Follow up  Returns for follow up for COPD Recent flare now resolving.  Feels cough and congestion are much better.  Sats were 78% on 3lpm pulsed when entering exam room. She is suppose to be on 3l continous flow and 4l/m with walking. We talked about continuous flow setting.  rec Follow med calendar closely and bring to each visit.  Please wear Oxygen 3l/m continuous flow and 4/m with activity.    03/28/2013  Acute extended ov/Wert re: chronic resp failure / copd GOLD III/ no med calendar Chief Complaint  Patient presents with  . Acute Visit    Pt c/o increased SOB for the past 2 days- with or without any exertion.   worse 2 d prior to OV  Vs day of ov,  not using 02 correctly though plainly  listed on med calendar as an automatic, not prn "med" hfa still quite poor and doesn't use prn alb neb at all rec 02 is 3lpm 24/7 but 4lpm with activity  Only use your albuterol/salbuterol inhaler as a rescue medication   - if not getting better from albterol inhaler then use the albuterol neb up to every 4 hours   06/06/2013 f/u ov/Wert re:  COPD III/ 02 dep/ has med calendar but she and husband still not understanding it Chief Complaint  Patient presents with  . Follow-up    Pt reports her breathing is unchanged since last visit. No new co's today.    comes in with daughter who is concerned her mother is becoming more confused than usual with how to take her meds.  However, no change in doe on present rx with  duoneb qid, bud bid, and prn hfa ventolin (does not appear to have prn neb alb in per prn bag, though this had maint meds in it) rec No change rx, use med calendar  07/18/2013 f/u ov/Wert re: GOLD III/ 02 dep / finally using med calendar approp/ on duoneb/bud bid plus prn duoneb in between Chief Complaint  Patient presents with  . Follow-up    Pt c/o DOE. Pt states overall she feels well, pt states she feels better from last OV. Denies CP/tightness and cough.   back to baseline doe on 02 can walk slow pace x sev hundred feet  >>no changes   09/27/2013 Follow up and Med review  Hx of COPD III/ 02 dep/ has med  Patient returns for followup and medication review. We reviewed all her medications and organized them into a medication calendar with patient education. Does get easily confused with meds. Appears family is helping with meds now.  Recently dx with dementia, now on Namenda. Seems to be some improvement with memory.  Denies chest pain , orthopnea , edema , fever, orthopnea or discolored mucus.  Flu shot is UTD .   Current Medications, Allergies, Complete Past Medical History, Past Surgical History, Family History, and Social History were reviewed in [employer] record.  ROS  The following are not active complaints unless bolded sore throat, dysphagia, dental problems, itching, sneezing,  nasal congestion or excess/ purulent secretions, ear ache,   fever, chills, sweats, unintended wt loss, pleuritic or exertional cp, hemoptysis,  orthopnea pnd or leg swelling, presyncope, palpitations, heartburn, abdominal pain, anorexia, nausea, vomiting, diarrhea  or change in bowel or urinary habits, change in stools or urine, dysuria,hematuria,  rash, arthralgias, visual complaints, headache, numbness weakness or ataxia or problems with walking or coordination,  change in mood/affect or memory.                   Objective:  Physical Exam   Wt 184 01/21/2011  >08/27/2011  186 > 180 09/29/2011 > 10/07/2011  183 > 11/11/2011  181 > 01/13/2012  183 >180 02/12/2012 > 06/09/2012 188 > 189 10/12/2012 >188 01/14/2013 > 01/21/2013 187 > 03/02/2013 >184 03/18/2013 >  03/28/2013 186> 06/06/2013  182 >  07/18/2013  179 >181 09/27/2013   HEENT mod non-specific turbinate edema.  Oropharynx no thrush or excess pnd or cobblestoning.  No JVD or cervical adenopathy. Mild accessory muscle hypertrophy. Trachea midline, nl thryroid. Chest was hyperinflated by percussion with diminished breath sound Regular rate and rhythm without murmur gallop or rub or increase P2 or edema.  Abd: no hsm, nl excursion. Ext warm without cyanosis or clubbing.  CXR  03/02/2013 :  1. Stable interstitial prominence consistent with interstitial fibrosis. No acute pulmonary infiltrates. 2. Mild cardiomegaly with prominent central pulmonary arteries which taper. A component of pulmonary hypertension may be present. 3. Left mastectomy.   Assessment:

## 2013-10-03 NOTE — Addendum Note (Signed)
Addended by: Parke Poisson E on: 10/03/2013 12:55 PM   Modules accepted: Orders

## 2013-10-05 ENCOUNTER — Ambulatory Visit (INDEPENDENT_AMBULATORY_CARE_PROVIDER_SITE_OTHER): Payer: Medicare Other | Admitting: Family Medicine

## 2013-10-05 ENCOUNTER — Encounter: Payer: Self-pay | Admitting: Family Medicine

## 2013-10-05 ENCOUNTER — Ambulatory Visit (INDEPENDENT_AMBULATORY_CARE_PROVIDER_SITE_OTHER): Payer: Medicare Other

## 2013-10-05 VITALS — BP 150/65 | HR 73 | Temp 97.6°F | Ht 66.0 in | Wt 180.0 lb

## 2013-10-05 DIAGNOSIS — M25529 Pain in unspecified elbow: Secondary | ICD-10-CM

## 2013-10-05 DIAGNOSIS — M25522 Pain in left elbow: Secondary | ICD-10-CM

## 2013-10-05 NOTE — Progress Notes (Signed)
   Subjective:    Patient ID: Katie Bryant, female    DOB: 09-25-1928, 80 y.o.   MRN: [id number]  HPI C/o left elbow discomfort and swelling for a week.   Review of Systems C/o left elbow swelling   No chest pain, SOB, HA, dizziness, vision change, N/V, diarrhea, constipation, dysuria, urinary urgency or frequency, myalgias, arthralgias or rash.  Objective:   Physical Exam Vital signs noted  Well developed well nourished female.  HEENT - Head atraumatic Normocephalic                Eyes - PERRLA, Conjuctiva - clear Sclera- Clear EOMI                Ears - EAC's Wnl TM's Wnl Gross Hearing WNL                Throat - oropharanx wnl Respiratory - Lungs CTA bilateral Cardiac - RRR S1 and S2 without murmur GI - Abdomen soft Nontender and bowel sounds active x 4 MS - Swelling left olecranon bursae  Procedure - Left elbow prepped with ETOH and then 10 cc's of straw colored fluid drawn off and then 40mg  Kenalolg injected into bursae and patient tolerates well.      Assessment & Plan:  Elbow pain, left - Plan: DG Elbow 2 Views Left Left elbow/ bursae joint aspiration and kenalog 40mg  inected.  Lysbeth Penner FNP

## 2013-10-13 ENCOUNTER — Other Ambulatory Visit: Payer: Self-pay | Admitting: Internal Medicine

## 2013-11-04 ENCOUNTER — Telehealth: Payer: Self-pay | Admitting: Internal Medicine

## 2013-11-04 NOTE — Telephone Encounter (Signed)
Called made pt aware of recs. Nothing further needed 

## 2013-11-04 NOTE — Telephone Encounter (Signed)
Spoke with patient-suggested she speak with her dentist about who he/she would suggest to use(such as an Chief Financial Officer) if needed. She should let the dentist know of her health hx and that she uses O2. Pt states she will ask but still wants [place] to send to MW to see if he has any suggestions.   MW please advise. Thanks.

## 2013-11-04 NOTE — Telephone Encounter (Signed)
No problem with dentist - best oral surgeon [facility].

## 2013-11-15 ENCOUNTER — Encounter: Payer: Self-pay | Admitting: Family Medicine

## 2013-11-15 ENCOUNTER — Ambulatory Visit (INDEPENDENT_AMBULATORY_CARE_PROVIDER_SITE_OTHER): Payer: Medicare Other | Admitting: Family Medicine

## 2013-11-15 VITALS — BP 133/72 | HR 90 | Temp 97.8°F | Ht 66.0 in | Wt 181.4 lb

## 2013-11-15 DIAGNOSIS — R739 Hyperglycemia, unspecified: Secondary | ICD-10-CM

## 2013-11-15 DIAGNOSIS — R7309 Other abnormal glucose: Secondary | ICD-10-CM

## 2013-11-15 LAB — GLUCOSE, POCT (MANUAL RESULT ENTRY): POC Glucose: 169 mg/dl — AB (ref 70–99)

## 2013-11-15 MED ORDER — ALPRAZOLAM 1 MG PO TABS: 1.0000 mg | ORAL_TABLET | Freq: Two times a day (BID) | ORAL | Status: DC | PRN

## 2013-11-15 NOTE — Progress Notes (Signed)
   Subjective:    Patient ID: Katie Bryant, female    DOB: 03-27-1928, 80 y.o.   MRN: [id number]  HPI Patient is here for c/o fatigue, anxiety, and elevated blood sugar. She has severe copd and is wearing oxygen 3 liters [place].  She is not sleeping well.  She is out of xanax.  She has been having elevated blood sugars.  Review of Systems  Constitutional: Positive for fatigue. Negative for fever.  HENT: Negative for ear pain.   Eyes: Negative for discharge.  Respiratory: Positive for shortness of breath. Negative for cough.   Cardiovascular: Negative for chest pain.  Gastrointestinal: Negative for abdominal distention.  Endocrine: Negative for polyuria.  Genitourinary: Negative for difficulty urinating.  Musculoskeletal: Negative for gait problem and neck pain.  Skin: Negative for color change and rash.  Neurological: Negative for speech difficulty and headaches.  Psychiatric/Behavioral: Negative for agitation. The patient is nervous/anxious.        Objective:    BP 133/72 mmHg  Pulse 90  Temp(Src) 97.8 F (36.6 C) (Oral)  Ht 5\' 6"  (1.676 m)  Wt 181 lb 6.4 oz (82.283 kg)  BMI 29.29 kg/m2 Physical Exam  Constitutional: She is oriented to person, place, and time. She appears well-developed and well-nourished.  HENT:  Head: Normocephalic and atraumatic.  Mouth/Throat: Oropharynx is clear and moist.  Eyes: Pupils are equal, round, and reactive to light.  Neck: Normal range of motion. Neck supple.  Cardiovascular: Normal rate and regular rhythm.   No murmur heard. Pulmonary/Chest: Effort normal and breath sounds normal.  Abdominal: Soft. Bowel sounds are normal. There is no tenderness.  Neurological: She is alert and oriented to person, place, and time.  Skin: Skin is warm and dry.  Psychiatric: She has a normal mood and affect.          Assessment & Plan:     ICD-9-CM ICD-10-CM   1. Elevated blood sugar 790.29 R73.09 POCT glucose (manual entry)     No Follow-up  on file.  Lysbeth Penner FNP

## 2013-12-08 ENCOUNTER — Other Ambulatory Visit: Payer: Self-pay | Admitting: Internal Medicine

## 2013-12-08 ENCOUNTER — Ambulatory Visit (INDEPENDENT_AMBULATORY_CARE_PROVIDER_SITE_OTHER): Payer: Medicare Other | Admitting: Internal Medicine

## 2013-12-08 ENCOUNTER — Encounter: Payer: Self-pay | Admitting: Internal Medicine

## 2013-12-08 VITALS — BP 120/70 | HR 71 | Ht 64.0 in | Wt 183.6 lb

## 2013-12-08 DIAGNOSIS — J9612 Chronic respiratory failure with hypercapnia: Secondary | ICD-10-CM

## 2013-12-08 DIAGNOSIS — J449 Chronic obstructive pulmonary disease, unspecified: Secondary | ICD-10-CM

## 2013-12-08 NOTE — Assessment & Plan Note (Signed)
-   PFT's 06/11/2007  FEV1   0.70 (34%) ratio 32 with 49% improvement p B2    - HFA 75% effective p coaching 06/20/2011 > 50% 06/24/2011  -Med calendar 09/27/2013   I had an extended discussion with the patient and daughter (not the one doing her meds) reviewing all relevant studies completed to date and  lasting 15 to 20 minutes of a 25 minute visit on the following ongoing concerns:   She is doing much better with daughter Katie Bryant's system of filling meds on Sundays as far as her maint rx is concerned but I don't feel comfortable making any changes in the maint plan unless Katie Bryant uses the same sheet we do (the med calendar, which the pt failed to bring with her but needs to in the future along with Katie Bryant to trust but verify meds are being done as per calendar)  The prns are another story.  Unless Katie Bryant is there at home with the pt, the pt or her other fm member need to run down the list of problems/ symptoms reading from left to right for each so that each problem is linked on the same line with my recs for rx in as simple a process as I can design for her.  See instructions for specific recommendations which were reviewed directly with the patient who was given a copy with highlighter outlining the key components.

## 2013-12-08 NOTE — Progress Notes (Signed)
Subjective:     Patient ID: Katie Bryant, female   DOB: Aug 13, 1928    MRN: [id number]   Brief patient profile:  33 yowf quit smoking 1992 with GOLD III copd dx 2009 eval in pulm clinic p hospitalized at [facility]    History of Present Illness   Admit date: 12/21/2010  Discharge date: 12/24/2010  C O P D  ALLERGIC RHINITIS  DYSPNEA  G E R D  Aortic stenosis  Insomnia  Leukocytosis   01/12/13 followup and medication review. Patient returns for followup and medication review. We Reviewed all her medications and organized them into a medication calendar with patient education. It appears the patient is taking her medications correctly est med calendar - pt brought all meds with her today.  pt She does complains of some chest congestion, prod cough w/ thick clear mucus, increased DOE, chest tightness w/ coughing x3days.  denies f/c/s, wheezing, dyspnea at rest, nausea, vomiting rec Follow med calendar closely and bring to each visit.  Augmentin 875mg  Twice daily  For 7 days -take w/ food  Continue on current regimen    01/21/2013 post er f/u ov/Nixon Kolton re copd/ acute flare with nasal congestion , better p neb and one shot steroids in ER 01/18/13  Chief Complaint  Patient presents with  . Acute Visit    Pt c/o increased SOB and cough. Her cough is prod with green to yellow sputum.   very poor insight into action plans at bottom of med calendar for specific symptoms rec Change the chlorpheniramine (clor tabs) to take one every 4 hours as needed for drippy nose For stuffy nose ok to use sudafed otc For cough > delsym 2tsp every 12 hours and use flutter valve as much as you can  Prednisone 10 mg take  4 each am x 2 days,   2 each am x 2 days,  1 each am x 2 days and stop  If not improving the next step is sinus CT - call Libby at [phone] to schedule> did not do  Admit date: 02/25/2013  Discharge date: 02/26/2013  Discharge Diagnoses:  Active Problems:  G E R D  Aortic stenosis  HTN  (hypertension)  Chronic respiratory failure  COPD exacerbation  Pneumonia  PNA (pneumonia)    03/02/2013 post hosp f/u ov/Baraa Tubbs re: copd/ ? pna > finishing levaquin/ pred taper  Chief Complaint  Patient presents with  . Follow-up    D/C from [facility] 02/26/2013. Semiproductive with white and green mucous. C/o DOE, weakness, decrease in appetite.     Still not following action plans at bottom of med calendar >>finish Levaquin/steroids , CXR >stable interstitial prominence   03/18/2013 Follow up  Returns for follow up for COPD Recent flare now resolving.  Feels cough and congestion are much better.  Sats were 78% on 3lpm pulsed when entering exam room. She is suppose to be on 3l continous flow and 4l/m with walking. We talked about continuous flow setting.  rec Follow med calendar closely and bring to each visit.  Please wear Oxygen 3l/m continuous flow and 4/m with activity.    03/28/2013  Acute extended ov/Harpreet Signore re: chronic resp failure / copd GOLD III/ no med calendar Chief Complaint  Patient presents with  . Acute Visit    Pt c/o increased SOB for the past 2 days- with or without any exertion.   worse 2 d prior to OV  Vs day of ov,  not using 02 correctly though plainly  listed on med calendar as an automatic, not prn "med" hfa still quite poor and doesn't use prn alb neb at all rec 02 is 3lpm 24/7 but 4lpm with activity  Only use your albuterol/salbuterol inhaler as a rescue medication   - if not getting better from albterol inhaler then use the albuterol neb up to every 4 hours   06/06/2013 f/u ov/Antiono Ettinger re:  COPD III/ 02 dep/ has med calendar but she and husband still not understanding it Chief Complaint  Patient presents with  . Follow-up    Pt reports her breathing is unchanged since last visit. No new co's today.    comes in with daughter who is concerned her mother is becoming more confused than usual with how to take her meds.  However, no change in doe on present rx with  duoneb qid, bud bid, and prn hfa ventolin (does not appear to have prn neb alb in per prn bag, though this had maint meds in it) rec No change rx, use med calendar  07/18/2013 f/u ov/Dyshon Philbin re: GOLD III/ 02 dep / finally using med calendar approp/ on duoneb/bud bid plus prn duoneb in between Chief Complaint  Patient presents with  . Follow-up    Pt c/o DOE. Pt states overall she feels well, pt states she feels better from last OV. Denies CP/tightness and cough.   back to baseline doe on 02 can walk slow pace x sev hundred feet  >>no changes   09/27/2013 Follow up and Med review  Hx of COPD III/ 02 dep/ has med  Patient returns for followup and medication review. We reviewed all her medications and organized them into a medication calendar with patient education. Does get easily confused with meds. Appears family is helping with meds now.  Recently dx with dementia, now on Namenda. Seems to be some improvement with memory.  rec No change rx/ follow med calendar and bring to each visit/ daughter [name]     12/08/2013 1st [facility] office visit/ [name]  / no med calendar/ no daughter  Chief Complaint  Patient presents with  . Follow-up    Pt states that overall her breathing is doing well.  She denies ane new co's today.    tamera is putting out all the medications for a week each Sunday including neb solutions  Has not needed ventolin   At all, nor any of her prns though she again struggled with the action plan portion of the calendar     No obvious day to day or daytime variabilty or assoc chronic cough or cp or chest tightness, subjective wheeze overt sinus or hb symptoms. No unusual exp hx or h/o childhood pna/ asthma or knowledge of premature birth.  Sleeping ok without nocturnal  or early am exacerbation  of respiratory  c/o's or need for noct saba. Also denies any obvious fluctuation of symptoms with weather or environmental changes or other aggravating or alleviating factors  except as outlined above   Current Medications, Allergies, Complete Past Medical History, Past Surgical History, Family History, and Social History were reviewed in [employer] record.  ROS  The following are not active complaints unless bolded sore throat, dysphagia, dental problems, itching, sneezing,  nasal congestion or excess/ purulent secretions, ear ache,   fever, chills, sweats, unintended wt loss, pleuritic or exertional cp, hemoptysis,  orthopnea pnd or leg swelling, presyncope, palpitations, heartburn, abdominal pain, anorexia, nausea, vomiting, diarrhea  or change in bowel or urinary habits, change  in stools or urine, dysuria,hematuria,  rash, arthralgias, visual complaints, headache, numbness weakness or ataxia or problems with walking or coordination,  change in mood/affect or memory.                   Objective:  Physical Exam   Wt 184 01/21/2011  >08/27/2011 186 > 180 09/29/2011 > 10/07/2011  183 > 11/11/2011  181 > 01/13/2012  183 >180 02/12/2012 > 06/09/2012 188 > 189 10/12/2012 >188 01/14/2013 > 01/21/2013 187 > 03/02/2013 >184 03/18/2013 >  03/28/2013 186> 06/06/2013  182 >  07/18/2013  179 >181 09/27/2013 > 12/08/2013   183   HEENT mod non-specific turbinate edema.  Oropharynx no thrush or excess pnd or cobblestoning.  No JVD or cervical adenopathy. Mild accessory muscle hypertrophy. Trachea midline, nl thryroid. Chest was hyperinflated by percussion with diminished breath sound Regular rate and rhythm without murmur gallop or rub or increase P2 or edema.  Abd: no hsm, nl excursion. Ext warm without cyanosis or clubbing.       CXR  03/02/2013 :  1. Stable interstitial prominence consistent with interstitial fibrosis. No acute pulmonary infiltrates. 2. Mild cardiomegaly with prominent central pulmonary arteries which taper. A component of pulmonary hypertension may be present. 3. Left mastectomy.   Assessment:

## 2013-12-08 NOTE — Assessment & Plan Note (Signed)
-   sats 90% RA 03/11/2011     - Dr Annamaria Boots started 02 around 2010    - 3 lpm 24/7 except 4lpm with activity as of 03/02/2013     - HC03 trending in low 30's since 2012 so likely hypercarbic component as well   Adequate control on present rx, reviewed > no change in rx needed

## 2013-12-08 NOTE — Patient Instructions (Signed)
See calendar for specific medication instructions and bring it back for each and every office visit for every healthcare provider you see.  Without it,  you may not receive the best quality medical care that we feel you deserve.  You will note that the calendar groups together  your maintenance  medications that are timed at particular times of the day.  Think of this as your checklist for what your doctor has instructed you to do until your next evaluation to see what benefit  there is  to staying on a consistent group of medications intended to keep you well.  The other group at the bottom is entirely up to you to use as you see fit  for specific symptoms that may arise between visits that require you to treat them on an as needed basis.  Think of this as your action plan or "what if" list.   Separating the top medications from the bottom group is fundamental to providing you adequate care going forward.    Please schedule a follow up visit in 3 months but call sooner if needed with Katie Bryant and your med calendar

## 2013-12-13 ENCOUNTER — Other Ambulatory Visit: Payer: Self-pay | Admitting: Family Medicine

## 2013-12-20 ENCOUNTER — Telehealth: Payer: Self-pay | Admitting: Internal Medicine

## 2013-12-20 NOTE — Telephone Encounter (Signed)
LMTCB

## 2013-12-21 NOTE — Telephone Encounter (Signed)
Called spoke with patient, advised of MW's recs as stated below Pt voiced her understanding and denied any further needs at this time Will sign off

## 2013-12-21 NOTE — Telephone Encounter (Signed)
i don't but it's such a common dz all dentists are familiar with it

## 2013-12-21 NOTE — Telephone Encounter (Signed)
I spoke with the pt and she states she is having some dental issues and wants to know if Dr. [name] recommends a dentist that is familiar with COPD patients and pt on oxygen? Please advise. Cayey Bing, CMA

## 2014-01-10 DIAGNOSIS — J441 Chronic obstructive pulmonary disease with (acute) exacerbation: Secondary | ICD-10-CM | POA: Diagnosis not present

## 2014-01-17 DIAGNOSIS — M5136 Other intervertebral disc degeneration, lumbar region: Secondary | ICD-10-CM | POA: Diagnosis not present

## 2014-01-19 DIAGNOSIS — J449 Chronic obstructive pulmonary disease, unspecified: Secondary | ICD-10-CM | POA: Diagnosis not present

## 2014-01-21 DIAGNOSIS — J449 Chronic obstructive pulmonary disease, unspecified: Secondary | ICD-10-CM | POA: Diagnosis not present

## 2014-02-10 DIAGNOSIS — J441 Chronic obstructive pulmonary disease with (acute) exacerbation: Secondary | ICD-10-CM | POA: Diagnosis not present

## 2014-02-19 DIAGNOSIS — J449 Chronic obstructive pulmonary disease, unspecified: Secondary | ICD-10-CM | POA: Diagnosis not present

## 2014-02-21 DIAGNOSIS — J449 Chronic obstructive pulmonary disease, unspecified: Secondary | ICD-10-CM | POA: Diagnosis not present

## 2014-02-22 ENCOUNTER — Ambulatory Visit: Payer: Self-pay | Admitting: Internal Medicine

## 2014-02-24 ENCOUNTER — Encounter: Payer: Self-pay | Admitting: Internal Medicine

## 2014-02-24 ENCOUNTER — Ambulatory Visit (INDEPENDENT_AMBULATORY_CARE_PROVIDER_SITE_OTHER): Payer: Medicare Other | Admitting: Internal Medicine

## 2014-02-24 ENCOUNTER — Encounter (INDEPENDENT_AMBULATORY_CARE_PROVIDER_SITE_OTHER): Payer: Self-pay

## 2014-02-24 VITALS — BP 122/58 | HR 81 | Ht 64.0 in | Wt 178.0 lb

## 2014-02-24 DIAGNOSIS — J449 Chronic obstructive pulmonary disease, unspecified: Secondary | ICD-10-CM

## 2014-02-24 DIAGNOSIS — J9612 Chronic respiratory failure with hypercapnia: Secondary | ICD-10-CM | POA: Diagnosis not present

## 2014-02-24 MED ORDER — VALSARTAN-HYDROCHLOROTHIAZIDE 80-12.5 MG PO TABS: 1.0000 | ORAL_TABLET | Freq: Every day | ORAL | Status: DC

## 2014-02-24 NOTE — Progress Notes (Signed)
Subjective:     Patient ID: Katie Bryant, female   DOB: Jan 16, 1928    MRN: [id number]   Brief patient profile:  31 yowf quit smoking 1992 with GOLD III copd dx 2009 eval in pulm clinic p hospitalized at [facility] 12/21/10     Admit date: 12/21/2010  Discharge date: 12/24/2010  C O P D  ALLERGIC RHINITIS  DYSPNEA  G E R D  Aortic stenosis  Insomnia  Leukocytosis    History of Present Illness  09/27/2013 Follow up and Med review  Hx of COPD III/ 02 dep/ has med  Patient returns for followup and medication review. We reviewed all her medications and organized them into a medication calendar with patient education. Does get easily confused with meds. Appears family is helping with meds now.  Recently dx with dementia, now on Namenda. Seems to be some improvement with memory.  rec No change rx/ follow med calendar and bring to each visit/ daughter Katie Bryant     12/08/2013  F/u   office visit/ Katie Bryant  / no med calendar/ no daughter  Chief Complaint  Patient presents with  . Follow-up    Pt states that overall her breathing is doing well.  She denies ane new co's today.    Katie Bryant is putting out all the medications for a week each Sunday including neb solutions  Has not needed ventolin   At all, nor any of her prns though she again struggled with the action plan portion of the calendar  rec No change rx    02/24/2014 f/u ov/Katie Bryant re: GOLD III / 02 dep  Chief Complaint  Patient presents with  . Follow-up    Pt states breathing is unchanged since last visit. She just gets anxious at times, her spouse has been sick. She does get sob with exhertion.  Katie Bryant with pt/ not clear she's increasing 02 to 4lpm with activity as rec nor able to use the action plans at the bottom of the med calendar Able to walk up to a hundred feet flat ok on 3lpm   No obvious day to day or daytime variabilty or assoc chronic cough or cp or chest tightness, subjective wheeze overt sinus or hb symptoms. No unusual  exp hx or h/o childhood pna/ asthma or knowledge of premature birth.  Sleeping ok without nocturnal  or early am exacerbation  of respiratory  c/o's or need for noct saba. Also denies any obvious fluctuation of symptoms with weather or environmental changes or other aggravating or alleviating factors except as outlined above   Current Medications, Allergies, Complete Past Medical History, Past Surgical History, Family History, and Social History were reviewed in [employer] record.  ROS  The following are not active complaints unless bolded sore throat, dysphagia, dental problems, itching, sneezing,  nasal congestion or excess/ purulent secretions, ear ache,   fever, chills, sweats, unintended wt loss, pleuritic or exertional cp, hemoptysis,  orthopnea pnd or leg swelling, presyncope, palpitations, heartburn, abdominal pain, anorexia, nausea, vomiting, diarrhea  or change in bowel or urinary habits, change in stools or urine, dysuria,hematuria,  rash, arthralgias, visual complaints, headache, numbness weakness or ataxia or problems with walking or coordination,  change in mood/affect or memory.                   Objective:  Physical Exam   Wt 184 01/21/2011  >08/27/2011 186 > 180 09/29/2011 > 10/07/2011  183 > 11/11/2011  181 > 01/13/2012  183 >  180 02/12/2012 > 06/09/2012 188 > 189 10/12/2012 >188 01/14/2013 > 01/21/2013 187 > 03/02/2013 >184 03/18/2013 >  03/28/2013 186> 06/06/2013  182 >  07/18/2013  179 >181 09/27/2013 > 12/08/2013   183 > 02/24/2014  178   HEENT mod non-specific turbinate edema.  Oropharynx no thrush or excess pnd or cobblestoning.  No JVD or cervical adenopathy. Mild accessory muscle hypertrophy. Trachea midline, nl thryroid. Chest was hyperinflated by percussion with diminished breath sound Regular rate and rhythm without murmur gallop or rub or increase P2 or edema.  Abd: no hsm, nl excursion. Ext warm without cyanosis or clubbing.         Assessment:

## 2014-02-24 NOTE — Patient Instructions (Signed)

## 2014-02-25 NOTE — Assessment & Plan Note (Signed)
-   PFT's 06/11/2007  FEV1   0.70 (34%) ratio 32 with 49% improvement p B2    - HFA 75% effective p coaching 06/20/2011 > 50% 06/24/2011  -Med calendar 09/27/2013   I had an extended discussion with the patient and daughter Katie Bryant  reviewing all relevant studies completed to date and  lasting 15 to 20 minutes of a 25 minute visit on the following ongoing concerns:   1) she has severe copd with very little room for error in the administration of her meds, both maint and prn  2) while [name] can help lay out the maint rx "for the week" the patient needs to be able to use the action plans at the bottom for every problem she's likely to encounter between visits  3) Each maintenance medication was reviewed in detail including most importantly the difference between maintenance and as needed and under what circumstances the prns are to be used. This was done in the context of a medication calendar review which provided the patient with a user-friendly unambiguous mechanism for medication administration and reconciliation and provides an action plan for all active problems. It is critical that this be shown to every doctor  for modification during the office visit if necessary so the patient can use it as a working document.      Note even when I had her read the problem going from Left to right on the page, she didn't finish reading one line before she jumped to the next one, though she clearly can read / suggesting an attention issue that may not be easily solved  See instructions for specific recommendations which were reviewed directly with the patient who was given a copy with highlighter outlining the key components.

## 2014-03-03 ENCOUNTER — Other Ambulatory Visit: Payer: Self-pay | Admitting: Internal Medicine

## 2014-03-20 DIAGNOSIS — J449 Chronic obstructive pulmonary disease, unspecified: Secondary | ICD-10-CM | POA: Diagnosis not present

## 2014-03-22 DIAGNOSIS — J449 Chronic obstructive pulmonary disease, unspecified: Secondary | ICD-10-CM | POA: Diagnosis not present

## 2014-03-28 DIAGNOSIS — M7022 Olecranon bursitis, left elbow: Secondary | ICD-10-CM | POA: Diagnosis not present

## 2014-04-07 ENCOUNTER — Ambulatory Visit: Payer: Medicare Other

## 2014-04-12 ENCOUNTER — Encounter: Payer: Self-pay | Admitting: Physician Assistant

## 2014-04-12 ENCOUNTER — Ambulatory Visit (INDEPENDENT_AMBULATORY_CARE_PROVIDER_SITE_OTHER): Payer: Medicare Other | Admitting: Physician Assistant

## 2014-04-12 VITALS — BP 135/79 | HR 103 | Temp 97.6°F | Ht 64.0 in | Wt 177.4 lb

## 2014-04-12 DIAGNOSIS — J441 Chronic obstructive pulmonary disease with (acute) exacerbation: Secondary | ICD-10-CM

## 2014-04-12 MED ORDER — AZITHROMYCIN 250 MG PO TABS: ORAL_TABLET | ORAL | Status: DC

## 2014-04-12 MED ORDER — PREDNISONE (PAK) 10 MG PO TABS: ORAL_TABLET | ORAL | Status: DC

## 2014-04-12 NOTE — Progress Notes (Signed)
   Subjective:    Patient ID: Katie Bryant, female    DOB: 26-Dec-1928, 81 y.o.   MRN: [id number]  HPI 81 y/o female with comorbid COPD on 24 hr day, 3 L oxygen presents with c/o deep cough, worse than her normal     Review of Systems  Constitutional: Negative.   HENT: Positive for congestion, sinus pressure and sneezing.   Respiratory: Positive for cough, shortness of breath and wheezing.   Neurological: Positive for headaches.       Objective:   Physical Exam  Cardiovascular: Normal rate.   Pulmonary/Chest: She has wheezes (throughout bilateral ).  Decreased breath sounds  Nursing note and vitals reviewed.         Assessment & Plan:  1. COPD Exacerbation: Azithromycin 250mg  as directed, Sterapred dose pack as directed.   O2 sat of 88% is baseline for patients condition. I have advised her to continue nebulizers as she is currently using every 4 hours. Cool mist humidifier. Call her Pulmonologist that she sees on a monthly basis to shedule appt asap. Also, if increase SOB occurs or s/s worsen, go directly to ER.

## 2014-04-19 ENCOUNTER — Encounter: Payer: Self-pay | Admitting: Cardiology

## 2014-04-19 ENCOUNTER — Ambulatory Visit (INDEPENDENT_AMBULATORY_CARE_PROVIDER_SITE_OTHER): Payer: Medicare Other | Admitting: Cardiology

## 2014-04-19 VITALS — BP 150/79 | HR 75 | Ht 65.0 in | Wt 178.0 lb

## 2014-04-19 DIAGNOSIS — I10 Essential (primary) hypertension: Secondary | ICD-10-CM

## 2014-04-19 NOTE — Progress Notes (Signed)
HPI The patient presents for followup of aortic stenosis. She is mostly limited by severe COPD. She has chronic dyspnea and is O2 dependent. She was recently treated with antibiotics and steroids but has not been hospitalized since I saw her last year. She's had no specific cardiovascular complaints and is not having any chest pressure, neck or arm discomfort. She's not had any palpitations, syncope or syncope. She's had no weight gain.  Of note she does report that she some roaring in her hand recently and had a head CT and carotid Dopplers done at [facility] and she was told these were "okay".  No Active Allergies  Current Outpatient Prescriptions  Medication Sig Dispense Refill  . acetaminophen (TYLENOL) 500 MG tablet Take 500 mg by mouth every 6 (six) hours as needed.    Marland Kitchen albuterol (PROVENTIL) (2.5 MG/3ML) 0.083% nebulizer solution Take 3 mLs (2.5 mg total) by nebulization every 6 (six) hours as needed for wheezing or shortness of breath. 75 mL 12  . ALPRAZolam (XANAX) 1 MG tablet Take 1 tablet (1 mg total) by mouth 2 (two) times daily as needed for anxiety. 60 tablet 3  . aspirin 81 MG tablet Take 81 mg by mouth daily.    Marland Kitchen atorvastatin (LIPITOR) 40 MG tablet TAKE ONE TABLET BY MOUTH ONCE DAILY AT SIX IN THE EVENING 30 tablet 4  . budesonide (PULMICORT) 0.5 MG/2ML nebulizer solution USE ONE VIAL 2 TIMES DAILY VIA NEBULIZER 120 mL 11  . buPROPion (WELLBUTRIN SR) 150 MG 12 hr tablet Take 150 mg by mouth 2 (two) times daily.    Marland Kitchen dextromethorphan (DELSYM) 30 MG/5ML liquid 2 tsp every 12 hours as needed for cough    . dimenhyDRINATE (DRAMAMINE) 50 MG tablet Take 50 mg by mouth every 8 (eight) hours as needed.    . docusate sodium (COLACE) 100 MG capsule Take 100 mg by mouth daily as needed for mild constipation.    . fenofibrate micronized (LOFIBRA) 134 MG capsule TAKE ONE CAPSULE BY MOUTH ONCE DAILY BEFORE BREAKFAST 90 capsule 3  . ipratropium-albuterol (DUONEB) 0.5-2.5 (3) MG/3ML SOLN USE ONE  VIAL 4 TIMES DAILY VIA NEBULIZER 120 mL 10  . meclizine (ANTIVERT) 25 MG tablet Take 25 mg by mouth every 8 (eight) hours as needed for dizziness.    . metoprolol tartrate (LOPRESSOR) 25 MG tablet Take 12.5 mg by mouth 2 (two) times daily.     Marland Kitchen NAMENDA XR 28 MG CP24 Take 1 capsule by mouth daily.     . ranitidine (ZANTAC) 150 MG tablet Take 150 mg by mouth at bedtime.    . sodium chloride (OCEAN) 0.65 % SOLN nasal spray 2 puffs every 4-6 hours as needed for nasal congestion    . valsartan-hydrochlorothiazide (DIOVAN-HCT) 80-12.5 MG per tablet Take 1 tablet by mouth daily. 30 tablet 11  . chlorpheniramine (CHLOR-TRIMETON) 4 MG tablet Take 8 mg by mouth every 6 (six) hours as needed.     Marland Kitchen Respiratory Therapy Supplies (FLUTTER) DEVI As needed    . VENTOLIN HFA 108 (90 BASE) MCG/ACT inhaler INHALE TWO PUFFS INTO LUNGS EVERY 6 HOURS AS NEEDED 18 each 2   No current facility-administered medications for this visit.    Past Medical History  Diagnosis Date  . COPD (chronic obstructive pulmonary disease)   . Oxygen dependent   . Aortic stenosis, mild   . Obesity   . Hypertension   . Anxiety   . Back pain   . Headache(784.0)   . Breast  cancer   . Asthma   . Sleep apnea   . DEMENTIA   . GERD (gastroesophageal reflux disease)   . Arthritis   . Pneumonia   . H/O hiatal hernia   . Depression   . Lymphadenopathy 10/07/2012  . Right hamstring muscle strain 04/26/2013    Past Surgical History  Procedure Laterality Date  . Cardiac catheterization      EF 55-60%  . Tonsillectomy and adenoidectomy  1942  . Breast reconstruction    . Appendectomy    . Mastectomy  1980    ROS:  Fatigue, diarrhea. Otherwise as stated in the HPI and negative for all other systems.  PHYSICAL EXAM BP 150/79 mmHg  Pulse 75  Ht 5\' 5"  (1.651 m)  Wt 178 lb (80.74 kg)  BMI 29.62 kg/m2 GENERAL:  Chronically ill appearing NECK:  No jugular venous distention, waveform within normal limits, carotid upstroke  brisk and symmetric, no bruits, no thyromegaly LUNGS:  Decreased breath sounds with no wheezing or crackles HEART:  PMI not displaced or sustained,S1 and S2 within normal limits, no S3, no S4, no clicks, no rubs, 2/6 systolic murmur her best at the left upper sternal border,  Very diminished heart sounds ABD:  Flat, positive bowel sounds normal in frequency in pitch, no bruits, no rebound, no guarding, no midline pulsatile mass, no hepatomegaly, no splenomegaly EXT:  2 plus pulses throughout, no edema, no cyanosis no clubbing  EKG:  Sinus rhythm, rate 75, left axis deviation, left anterior fascicular block, interventricular conduction delay, no acute ST-T wave changes. 04/19/2014  ASSESSMENT AND PLAN  Aortic stenosis - She had very mild AS in the past.  No further workup is suggested. No change in therapy is indicated.    HTN (hypertension) -  Her blood pressure is slightly elevated today but otherwise has been well controlled and she will continue the meds as listed.  Carotid stenosis - Since she had this followed up recently at [facility] I will plan to repeat this in May of next year.

## 2014-04-19 NOTE — Patient Instructions (Signed)
The current medical regimen is effective;  continue present plan and medications.  Follow up in 1 year with Dr. Hochrein in [name].  You will receive a letter in the mail 2 months before you are due.  Please call us when you receive this letter to schedule your follow up appointment.  Thank you for choosing [facility]!!     

## 2014-04-20 DIAGNOSIS — J449 Chronic obstructive pulmonary disease, unspecified: Secondary | ICD-10-CM | POA: Diagnosis not present

## 2014-04-22 DIAGNOSIS — J449 Chronic obstructive pulmonary disease, unspecified: Secondary | ICD-10-CM | POA: Diagnosis not present

## 2014-05-02 ENCOUNTER — Other Ambulatory Visit: Payer: Self-pay | Admitting: Family Medicine

## 2014-05-04 ENCOUNTER — Other Ambulatory Visit: Payer: Self-pay | Admitting: Physician Assistant

## 2014-05-04 NOTE — Telephone Encounter (Signed)
rx called into [facility] pharmacy and left on vm. Pt aware.

## 2014-05-04 NOTE — Telephone Encounter (Signed)
Last seen 04/12/14 Tiffany  If approved route to nurse to call into [facility]

## 2014-05-20 DIAGNOSIS — J449 Chronic obstructive pulmonary disease, unspecified: Secondary | ICD-10-CM | POA: Diagnosis not present

## 2014-05-22 DIAGNOSIS — J449 Chronic obstructive pulmonary disease, unspecified: Secondary | ICD-10-CM | POA: Diagnosis not present

## 2014-06-06 ENCOUNTER — Other Ambulatory Visit: Payer: Self-pay | Admitting: Cardiology

## 2014-06-06 ENCOUNTER — Other Ambulatory Visit: Payer: Self-pay | Admitting: Internal Medicine

## 2014-06-06 DIAGNOSIS — I6523 Occlusion and stenosis of bilateral carotid arteries: Secondary | ICD-10-CM

## 2014-06-06 MED ORDER — ALBUTEROL SULFATE HFA 108 (90 BASE) MCG/ACT IN AERS: INHALATION_SPRAY | RESPIRATORY_TRACT | Status: DC

## 2014-06-09 ENCOUNTER — Ambulatory Visit (INDEPENDENT_AMBULATORY_CARE_PROVIDER_SITE_OTHER): Payer: Medicare Other | Admitting: Internal Medicine

## 2014-06-09 ENCOUNTER — Ambulatory Visit (INDEPENDENT_AMBULATORY_CARE_PROVIDER_SITE_OTHER)
Admission: RE | Admit: 2014-06-09 | Discharge: 2014-06-09 | Disposition: A | Discharge status: Home / Self Care | Payer: Medicare Other | Source: Ambulatory Visit | Attending: Internal Medicine | Admitting: Internal Medicine

## 2014-06-09 ENCOUNTER — Encounter: Payer: Self-pay | Admitting: Internal Medicine

## 2014-06-09 VITALS — BP 120/60 | HR 65 | Ht 66.0 in | Wt 177.0 lb

## 2014-06-09 DIAGNOSIS — J449 Chronic obstructive pulmonary disease, unspecified: Secondary | ICD-10-CM | POA: Diagnosis not present

## 2014-06-09 DIAGNOSIS — J9612 Chronic respiratory failure with hypercapnia: Secondary | ICD-10-CM | POA: Diagnosis not present

## 2014-06-09 NOTE — Patient Instructions (Addendum)
See calendar for specific medication instructions and bring it back for each and every office visit for every healthcare provider you see.  Without it,  you may not receive the best quality medical care that we feel you deserve.  You will note that the calendar groups together  your maintenance  medications that are timed at particular times of the day.  Think of this as your checklist for what your doctor has instructed you to do until your next evaluation to see what benefit  there is  to staying on a consistent group of medications intended to keep you well.  The other group at the bottom is entirely up to you to use as you see fit  for specific symptoms that may arise between visits that require you to treat them on an as needed basis.  Think of this as your action plan or "what if" list.   Separating the top medications from the bottom group is fundamental to providing you adequate care going forward.    Please schedule a follow up visit in 3 months but call sooner if needed  Late instructions per med calendar :  3lpm 24/7 but 4lpm with exertion

## 2014-06-09 NOTE — Progress Notes (Signed)
Subjective:     Patient ID: Katie Bryant, female   DOB: December 21, 1928    MRN: [id number]   Brief patient profile:  89 yowf quit smoking 1992 with GOLD III copd dx 2009 eval in pulm clinic p hospitalized at [facility] 12/21/10     Admit date: 12/21/2010  Discharge date: 12/24/2010  C O P D  ALLERGIC RHINITIS  DYSPNEA  G E R D  Aortic stenosis  Insomnia  Leukocytosis    History of Present Illness  09/27/2013 Follow up and Med review  Hx of COPD III/ 02 dep/ has med  Patient returns for followup and medication review. We reviewed all her medications and organized them into a medication calendar with patient education. Does get easily confused with meds. Appears family is helping with meds now.  Recently dx with dementia, now on Namenda. Seems to be some improvement with memory.  rec No change rx/ follow med calendar and bring to each visit/ daughter [name]     12/08/2013  F/u   office visit/ [name]  / no med calendar/ no daughter  Chief Complaint  Patient presents with  . Follow-up    Pt states that overall her breathing is doing well.  She denies ane new co's today.   [name] is putting out all the medications for a week each Sunday including neb solutions  Has not needed ventolin   At all, nor any of her prns though she again struggled with the action plan portion of the calendar  rec No change rx    02/24/2014 f/u ov/Raunel Dimartino re: GOLD III / 02 dep  Chief Complaint  Patient presents with  . Follow-up    Pt states breathing is unchanged since last visit. She just gets anxious at times, her spouse has been sick. She does get sob with exhertion.  tamera with pt/ not clear she's increasing 02 to 4lpm with activity as rec nor able to use the action plans at the bottom of the med calendar Able to walk up to a hundred feet flat ok on 3lpm  rec Follow med calendar    06/09/2014 f/u ov/Brenn Gatton re: GOLD III copd/ 02 dep/ no med calendar (brought by daughter who is POA and is not her pill  Environmental education officer) Chief Complaint  Patient presents with  . Follow-up    Pt states that her breathing is unchanged. No new co's today.   02 not clear what it's set on at home but pt thinks it's 3lpm but takes it off freqently  No change doe x more than slow adls   No obvious day to day or daytime variabilty or assoc chronic cough or cp or chest tightness, subjective wheeze overt sinus or hb symptoms. No unusual exp hx or h/o childhood pna/ asthma or knowledge of premature birth.  Sleeping ok without nocturnal  or early am exacerbation  of respiratory  c/o's or need for noct saba. Also denies any obvious fluctuation of symptoms with weather or environmental changes or other aggravating or alleviating factors except as outlined above   Current Medications, Allergies, Complete Past Medical History, Past Surgical History, Family History, and Social History were reviewed in [employer] record.  ROS  The following are not active complaints unless bolded sore throat, dysphagia, dental problems, itching, sneezing,  nasal congestion or excess/ purulent secretions, ear ache,   fever, chills, sweats, unintended wt loss, pleuritic or exertional cp, hemoptysis,  orthopnea pnd or leg swelling, presyncope, palpitations, heartburn, abdominal pain, anorexia,  nausea, vomiting, diarrhea  or change in bowel or urinary habits, change in stools or urine, dysuria,hematuria,  rash, arthralgias, visual complaints, headache, numbness weakness or ataxia or problems with walking or coordination,  change in mood/affect or memory.                   Objective:  Physical Exam  Stoic amb wf nad on 02/ vital signs reviewed   Wt 184 01/21/2011  >08/27/2011 186 > 180 09/29/2011 > 10/07/2011  183 > 11/11/2011  181 > 01/13/2012  183 >180 02/12/2012 > 06/09/2012 188 > 189 10/12/2012 >188 01/14/2013 > 01/21/2013 187 > 03/02/2013 >184 03/18/2013 >  03/28/2013 186> 06/06/2013  182 >  07/18/2013  179 >181 09/27/2013 > 12/08/2013   183  > 02/24/2014  178 > 06/09/2014 177  HEENT mod non-specific turbinate edema.  Oropharynx no thrush or excess pnd or cobblestoning.  No JVD or cervical adenopathy. Mild accessory muscle hypertrophy. Trachea midline, nl thryroid. Chest was hyperinflated by percussion with diminished breath sound Regular rate and rhythm without murmur gallop or rub or increase P2 or edema.  Abd: no hsm, nl excursion. Ext warm without cyanosis or clubbing.         CXR PA and Lateral:   06/09/2014 :     I personally reviewed images and agree with radiology impression as follows:    Underlying emphysematous change with mild fibrotic change in the bases. No edema or consolidation.  Assessment:

## 2014-06-10 ENCOUNTER — Encounter: Payer: Self-pay | Admitting: Internal Medicine

## 2014-06-10 NOTE — Assessment & Plan Note (Signed)
-   PFT's 06/11/2007  FEV1   0.70 (34%) ratio 32 with 49% improvement p B2    - HFA 75% effective p coaching 06/20/2011 > 50% 06/24/2011  -Med calendar 09/27/2013 > did not bring as requested 06/09/14    Despite challenges with med reconciliation she's actually relatively well compensated   I had an extended discussion with the patient and daughter reviewing all relevant studies completed to date and  lasting 15 to 20 minutes of a 25 minute visit on the following ongoing concerns:   Need to maintain accurate med rec at all times now that pt can no longer, due to cognitive impairment, keep up with maint rx  Also they need to understand how to implement the action plan at the bottom of the med calendar    Each maintenance medication was reviewed in detail including most importantly the difference between maintenance and as needed and under what circumstances the prns are to be used. This was done in the context of a medication calendar review which provided the patient with a user-friendly unambiguous mechanism for medication administration and reconciliation and provides an action plan for all active problems. It is critical that this be shown to every doctor  for modification during the office visit if necessary so the patient can use it as a working document.

## 2014-06-10 NOTE — Assessment & Plan Note (Signed)
-   sats 90% RA 03/11/2011     - Dr Annamaria Boots started 02 around 2010    - 3 lpm 24/7 except 4lpm with activity as of 03/02/2013     - HC03 trending in low 30's since 2012 so likely hypercarbic component as well - 02/24/2014   Walked RA x one lap @ 185 stopped due to  desat to 83% corrected on 4lpm  - 06/10/2014   Walked RA x one lap @ 185 stopped due to  Sob with sats 89% at end with nl pace   rx as of 6/3 /16 = 3lpm 24/7 except 4lpm when walking more than room to room

## 2014-06-12 ENCOUNTER — Ambulatory Visit (HOSPITAL_COMMUNITY): Payer: Medicare Other | Attending: Cardiovascular Disease

## 2014-06-12 DIAGNOSIS — I6523 Occlusion and stenosis of bilateral carotid arteries: Secondary | ICD-10-CM | POA: Insufficient documentation

## 2014-06-13 DIAGNOSIS — M5136 Other intervertebral disc degeneration, lumbar region: Secondary | ICD-10-CM | POA: Diagnosis not present

## 2014-06-13 DIAGNOSIS — M7072 Other bursitis of hip, left hip: Secondary | ICD-10-CM | POA: Diagnosis not present

## 2014-06-13 DIAGNOSIS — M7071 Other bursitis of hip, right hip: Secondary | ICD-10-CM | POA: Diagnosis not present

## 2014-06-14 ENCOUNTER — Other Ambulatory Visit: Payer: Self-pay

## 2014-06-14 MED ORDER — FENOFIBRATE MICRONIZED 134 MG PO CAPS: ORAL_CAPSULE | ORAL | Status: DC

## 2014-06-14 NOTE — Telephone Encounter (Signed)
Per note 4.13.16

## 2014-06-15 ENCOUNTER — Other Ambulatory Visit: Payer: Self-pay

## 2014-06-15 MED ORDER — METOPROLOL TARTRATE 25 MG PO TABS: 12.5000 mg | ORAL_TABLET | Freq: Two times a day (BID) | ORAL | Status: DC

## 2014-06-15 NOTE — Telephone Encounter (Signed)
Per note 4.13.16

## 2014-06-17 ENCOUNTER — Other Ambulatory Visit: Payer: Self-pay | Admitting: Family Medicine

## 2014-06-19 NOTE — Telephone Encounter (Signed)
Last seen 04/12/14 Katie Bryant   Last lipid 05/10/13

## 2014-06-20 DIAGNOSIS — J449 Chronic obstructive pulmonary disease, unspecified: Secondary | ICD-10-CM | POA: Diagnosis not present

## 2014-06-22 DIAGNOSIS — J449 Chronic obstructive pulmonary disease, unspecified: Secondary | ICD-10-CM | POA: Diagnosis not present

## 2014-06-27 ENCOUNTER — Other Ambulatory Visit: Payer: Self-pay

## 2014-06-27 MED ORDER — ATORVASTATIN CALCIUM 40 MG PO TABS: ORAL_TABLET | ORAL | Status: DC

## 2014-06-28 ENCOUNTER — Encounter: Payer: Self-pay | Admitting: Family Medicine

## 2014-06-28 ENCOUNTER — Ambulatory Visit (INDEPENDENT_AMBULATORY_CARE_PROVIDER_SITE_OTHER): Payer: Medicare Other | Admitting: Family Medicine

## 2014-06-28 VITALS — BP 139/69 | HR 80 | Temp 99.2°F | Ht 66.0 in | Wt 178.0 lb

## 2014-06-28 DIAGNOSIS — J449 Chronic obstructive pulmonary disease, unspecified: Secondary | ICD-10-CM | POA: Diagnosis not present

## 2014-06-28 MED ORDER — BENZONATATE 200 MG PO CAPS: 200.0000 mg | ORAL_CAPSULE | Freq: Two times a day (BID) | ORAL | Status: DC | PRN

## 2014-06-28 MED ORDER — MONTELUKAST SODIUM 10 MG PO TABS: 10.0000 mg | ORAL_TABLET | Freq: Every day | ORAL | Status: DC

## 2014-06-28 NOTE — Progress Notes (Signed)
Subjective:    Patient ID: Katie Bryant, female    DOB: 1928/09/10, 81 y.o.   MRN: [id number]  HPI 81 year old female with history of COPD who presents today with chief complaint of increased coughing. Cough is productive of clear sputum. No fever or chills. Current meds for breathing include Pulmicort and albuterol inhalers. Patient feels like there may be an allergic component because her cough seems to be worse at home where she admits to increased dust in the house.  Patient Active Problem List   Diagnosis Date Noted  . Right hamstring muscle strain 04/26/2013  . Pneumonia 02/25/2013  . PNA (pneumonia) 02/25/2013  . COPD exacerbation 01/18/2013  . Lymphadenopathy 10/07/2012  . Chronic respiratory failure with hypercapnia 03/11/2011  . HTN (hypertension) 02/12/2011  . Hyperlipidemia 02/12/2011  . Pneumonia, organism unspecified 01/21/2011  . Leukocytosis 12/23/2010  . Insomnia 12/22/2010  . Aortic stenosis 07/03/2010  . Chronic rhinitis 07/03/2007  . G E R D 06/06/2007  . OBESITY 05/25/2007  . COPD GOLD III/ 02 dep  05/25/2007   Outpatient Encounter Prescriptions as of 06/28/2014  Medication Sig  . acetaminophen (TYLENOL) 500 MG tablet Take 500 mg by mouth every 6 (six) hours as needed.  Marland Kitchen albuterol (PROVENTIL) (2.5 MG/3ML) 0.083% nebulizer solution Take 3 mLs (2.5 mg total) by nebulization every 6 (six) hours as needed for wheezing or shortness of breath.  Marland Kitchen albuterol (VENTOLIN HFA) 108 (90 BASE) MCG/ACT inhaler INHALE TWO PUFFS INTO LUNGS EVERY 6 HOURS AS NEEDED  . ALPRAZolam (XANAX) 1 MG tablet TAKE ONE TABLET BY MOUTH TWICE DAILY AS NEEDED FOR ANXIETY  . aspirin 81 MG tablet Take 81 mg by mouth daily.  Marland Kitchen atorvastatin (LIPITOR) 40 MG tablet TAKE ONE TABLET BY MOUTH ONCE DAILY AT SIX IN THE EVENING  . bisacodyl (DULCOLAX) 5 MG EC tablet Take 5 mg by mouth daily as needed for moderate constipation.  . budesonide (PULMICORT) 0.5 MG/2ML nebulizer solution USE ONE VIAL 2 TIMES  DAILY VIA NEBULIZER  . buPROPion (WELLBUTRIN SR) 150 MG 12 hr tablet Take 150 mg by mouth 2 (two) times daily.  . chlorpheniramine (CHLOR-TRIMETON) 4 MG tablet Take 8 mg by mouth every 6 (six) hours as needed.   . dimenhyDRINATE (DRAMAMINE) 50 MG tablet Take 50 mg by mouth every 8 (eight) hours as needed.  . docusate sodium (COLACE) 100 MG capsule Take 100 mg by mouth daily as needed for mild constipation.  . fenofibrate micronized (LOFIBRA) 134 MG capsule TAKE ONE CAPSULE BY MOUTH ONCE DAILY BEFORE BREAKFAST  . guaifenesin (ROBITUSSIN) 100 MG/5ML syrup Take 200 mg by mouth 3 (three) times daily as needed for cough.  Marland Kitchen ipratropium-albuterol (DUONEB) 0.5-2.5 (3) MG/3ML SOLN USE ONE VIAL 4 TIMES DAILY VIA NEBULIZER  . ketotifen (ZADITOR) 0.025 % ophthalmic solution Place 1 drop into both eyes as needed.  . metoprolol tartrate (LOPRESSOR) 25 MG tablet Take 0.5 tablets (12.5 mg total) by mouth 2 (two) times daily.  Marland Kitchen NAMENDA XR 28 MG CP24 Take 1 capsule by mouth daily.   . ranitidine (ZANTAC) 150 MG tablet Take 150 mg by mouth at bedtime.  Marland Kitchen Respiratory Therapy Supplies (FLUTTER) DEVI As needed  . sodium chloride (OCEAN) 0.65 % SOLN nasal spray 2 puffs every 4-6 hours as needed for nasal congestion  . valsartan-hydrochlorothiazide (DIOVAN-HCT) 80-12.5 MG per tablet Take 1 tablet by mouth daily.   No facility-administered encounter medications on file as of 06/28/2014.       Review of Systems  Constitutional: Negative.   Respiratory: Positive for cough and shortness of breath.   Cardiovascular: Negative.   Neurological: Negative.   Psychiatric/Behavioral: Negative.        Objective:   Physical Exam  Constitutional: She is oriented to person, place, and time. She appears well-developed and well-nourished.  Pulmonary/Chest: Effort normal. No respiratory distress. She has no wheezes. She has no rales.  On auscultation, I do not hear air moving well but there are no wheezes. She does cough  from time to time during the encounter in the cough sounds dry to me  Neurological: She is alert and oriented to person, place, and time.    BP 139/69 mmHg  Pulse 80  Temp(Src) 99.2 F (37.3 C) (Oral)  Ht 5\' 6"  (1.676 m)  Wt 178 lb (80.74 kg)  BMI 28.74 kg/m2  SpO2 88%       Assessment & Plan:   1. COPD GOLD III/ 02 dep  I believe her cough is more related to an allergic rhonchi this morning true exacerbation of her COPD. As such, I would like to treat it with Tessalon Perles and Singulair. Patient has also taken any histamine in the past for what was felt to be allergy and she will continue that as well. Instructions to let us know if sputum changes and starts to increase in amount or color becomes darker  Wardell Honour MD

## 2014-06-28 NOTE — Patient Instructions (Signed)
Cough, Adult  A cough is a reflex that helps clear your throat and airways. It can help heal the body or may be a reaction to an irritated airway. A cough may only last 2 or 3 weeks (acute) or may last more than 8 weeks (chronic).  CAUSES Acute cough:  Viral or bacterial infections. Chronic cough:  Infections.  Allergies.  Asthma.  Post-nasal drip.  Smoking.  Heartburn or acid reflux.  Some medicines.  Chronic lung problems (COPD).  Cancer. SYMPTOMS   Cough.  Fever.  Chest pain.  Increased breathing rate.  High-pitched whistling sound when breathing (wheezing).  Colored mucus that you cough up (sputum). TREATMENT   A bacterial cough may be treated with antibiotic medicine.  A viral cough must run its course and will not respond to antibiotics.  Your caregiver may recommend other treatments if you have a chronic cough. HOME CARE INSTRUCTIONS   Only take over-the-counter or prescription medicines for pain, discomfort, or fever as directed by your caregiver. Use cough suppressants only as directed by your caregiver.  Use a cold steam vaporizer or humidifier in your bedroom or home to help loosen secretions.  Sleep in a semi-upright position if your cough is worse at night.  Rest as needed.  Stop smoking if you smoke. SEEK IMMEDIATE MEDICAL CARE IF:   You have pus in your sputum.  Your cough starts to worsen.  You cannot control your cough with suppressants and are losing sleep.  You begin coughing up blood.  You have difficulty breathing.  You develop pain which is getting worse or is uncontrolled with medicine.  You have a fever. MAKE SURE YOU:   Understand these instructions.  Will watch your condition.  Will get help right away if you are not doing well or get worse. Document Released: 06/21/2010 Document Revised: 03/17/2011 Document Reviewed: 06/21/2010 ExitCare Patient Information 2015 [employer]. This information is not intended  to replace advice given to you by your health care provider. Make sure you discuss any questions you have with your health care provider.  

## 2014-07-10 DIAGNOSIS — J441 Chronic obstructive pulmonary disease with (acute) exacerbation: Secondary | ICD-10-CM | POA: Diagnosis not present

## 2014-07-10 DIAGNOSIS — J209 Acute bronchitis, unspecified: Secondary | ICD-10-CM | POA: Diagnosis not present

## 2014-07-10 DIAGNOSIS — R05 Cough: Secondary | ICD-10-CM | POA: Diagnosis not present

## 2014-07-14 ENCOUNTER — Other Ambulatory Visit: Payer: Self-pay | Admitting: Physician Assistant

## 2014-07-17 NOTE — Telephone Encounter (Signed)
Last seen 06/28/14 Dr Sabra Heck  If approved route to nurse to call into [facility]  [phone]

## 2014-07-18 ENCOUNTER — Telehealth: Payer: Self-pay | Admitting: Nurse Practitioner

## 2014-07-18 MED ORDER — ALPRAZOLAM 1 MG PO TABS: 1.0000 mg | ORAL_TABLET | Freq: Two times a day (BID) | ORAL | Status: DC | PRN

## 2014-07-18 NOTE — Telephone Encounter (Signed)
Please call in xanax 1mg  1 po BID #60  with 1 refills

## 2014-07-18 NOTE — Telephone Encounter (Signed)
Patients daughter aware

## 2014-07-18 NOTE — Telephone Encounter (Signed)
Refill called to [facility] @ [phone]

## 2014-07-19 ENCOUNTER — Other Ambulatory Visit: Payer: Self-pay

## 2014-07-20 DIAGNOSIS — J449 Chronic obstructive pulmonary disease, unspecified: Secondary | ICD-10-CM | POA: Diagnosis not present

## 2014-07-21 ENCOUNTER — Other Ambulatory Visit: Payer: Self-pay | Admitting: Physician Assistant

## 2014-07-22 DIAGNOSIS — J449 Chronic obstructive pulmonary disease, unspecified: Secondary | ICD-10-CM | POA: Diagnosis not present

## 2014-07-24 NOTE — Telephone Encounter (Signed)
Last seen 06/28/14 Dr Sabra Heck  Last lipid 05/10/13

## 2014-07-31 DIAGNOSIS — J449 Chronic obstructive pulmonary disease, unspecified: Secondary | ICD-10-CM | POA: Diagnosis not present

## 2014-08-16 ENCOUNTER — Other Ambulatory Visit: Payer: Self-pay | Admitting: Physician Assistant

## 2014-08-16 NOTE — Telephone Encounter (Signed)
Last seen 06/28/14  Dr Sabra Heck  If approved route to nurse to call into [place]  [phone]

## 2014-08-20 DIAGNOSIS — J449 Chronic obstructive pulmonary disease, unspecified: Secondary | ICD-10-CM | POA: Diagnosis not present

## 2014-08-22 DIAGNOSIS — J449 Chronic obstructive pulmonary disease, unspecified: Secondary | ICD-10-CM | POA: Diagnosis not present

## 2014-08-22 NOTE — Telephone Encounter (Signed)
Called into pharmacy

## 2014-08-29 ENCOUNTER — Other Ambulatory Visit: Payer: Self-pay | Admitting: Internal Medicine

## 2014-08-29 MED ORDER — BUDESONIDE 0.5 MG/2ML IN SUSP: RESPIRATORY_TRACT | Status: DC

## 2014-09-07 ENCOUNTER — Encounter: Payer: Self-pay | Admitting: Internal Medicine

## 2014-09-07 ENCOUNTER — Ambulatory Visit (INDEPENDENT_AMBULATORY_CARE_PROVIDER_SITE_OTHER): Payer: Medicare Other | Admitting: Internal Medicine

## 2014-09-07 VITALS — BP 134/72 | HR 69 | Ht 66.0 in | Wt 175.0 lb

## 2014-09-07 DIAGNOSIS — J449 Chronic obstructive pulmonary disease, unspecified: Secondary | ICD-10-CM

## 2014-09-07 DIAGNOSIS — J9612 Chronic respiratory failure with hypercapnia: Secondary | ICD-10-CM | POA: Diagnosis not present

## 2014-09-07 MED ORDER — MONTELUKAST SODIUM 10 MG PO TABS: 10.0000 mg | ORAL_TABLET | Freq: Every day | ORAL | Status: DC

## 2014-09-07 NOTE — Progress Notes (Signed)
Subjective:     Patient ID: Katie Bryant, female   DOB: 21-Sep-1928    MRN: [id number]   Brief patient profile:  4 yowf quit smoking 1992 with GOLD III copd dx 2009 eval in pulm clinic p hospitalized at [facility] 12/21/10     Admit date: 12/21/2010  Discharge date: 12/24/2010  C O P D  ALLERGIC RHINITIS  DYSPNEA  G E R D  Aortic stenosis  Insomnia  Leukocytosis    History of Present Illness  09/27/2013 Follow up and Med review  Hx of COPD III/ 02 dep/ has med  Patient returns for followup and medication review. We reviewed all her medications and organized them into a medication calendar with patient education. Does get easily confused with meds. Appears family is helping with meds now.  Recently dx with dementia, now on Namenda. Seems to be some improvement with memory.  rec No change rx/ follow med calendar and bring to each visit/ daughter Katie Bryant     12/08/2013  F/u   office visit/ [name]  / no med calendar/ no daughter  Chief Complaint  Patient presents with  . Follow-up    Pt states that overall her breathing is doing well.  She denies ane new co's today.   Katie Bryant is putting out all the medications for a week each Sunday including neb solutions  Has not needed ventolin   At all, nor any of her prns though she again struggled with the action plan portion of the calendar  rec No change rx    02/24/2014 f/u ov/Larri Yehle re: GOLD III / 02 dep  Chief Complaint  Patient presents with  . Follow-up    Pt states breathing is unchanged since last visit. She just gets anxious at times, her spouse has been sick. She does get sob with exhertion.  tamera with pt/ not clear she's increasing 02 to 4lpm with activity as rec nor able to use the action plans at the bottom of the med calendar Able to walk up to a hundred feet flat ok on 3lpm  rec Follow med calendar    06/09/2014 f/u ov/Demtrius Rounds re: GOLD III copd/ 02 dep/ no med calendar (brought by daughter who is POA and is not her pill  Environmental education officer) Chief Complaint  Patient presents with  . Follow-up    Pt states that her breathing is unchanged. No new co's today.   02 not clear what it's set on at home but pt thinks it's 3lpm but takes it off freqently  No change doe x more than slow adls rec Late instructions per med calendar :  3lpm 24/7 but 4lpm with exertion    09/07/2014 f/u ov/Jaymien Landin re: copd gold III pfts/ chronic resp failure  Chief Complaint  Patient presents with  . Follow-up    Pt c/o SOB early mornings and with activity. Denies any chest tightness, wheezing, and cough.   has titrated oxygen at 2 L/m 24/7 but 4 L with activity Sems  to be doing better on maintenance therapy but still having trouble understanding the action plan Works. Her family is taking an active role in her medication administration which seems to be helping more than anything else.  Doe = MMRC2/3  = can't walk a nl pace on a flat grade s sob even on 02     No obvious day to day or daytime variabilty or assoc chronic cough or cp or chest tightness, subjective wheeze overt sinus or hb symptoms. No unusual exp  hx or h/o childhood pna/ asthma or knowledge of premature birth.  Sleeping ok without nocturnal  or early am exacerbation  of respiratory  c/o's or need for noct saba. Also denies any obvious fluctuation of symptoms with weather or environmental changes or other aggravating or alleviating factors except as outlined above   Current Medications, Allergies, Complete Past Medical History, Past Surgical History, Family History, and Social History were reviewed in [employer] record.  ROS  The following are not active complaints unless bolded sore throat, dysphagia, dental problems, itching, sneezing,  nasal congestion or excess/ purulent secretions, ear ache,   fever, chills, sweats, unintended wt loss, pleuritic or exertional cp, hemoptysis,  orthopnea pnd or leg swelling, presyncope, palpitations, heartburn, abdominal  pain, anorexia, nausea, vomiting, diarrhea  or change in bowel or urinary habits, change in stools or urine, dysuria,hematuria,  rash, arthralgias, visual complaints, headache, numbness weakness or ataxia or problems with walking or coordination,  change in mood/affect or memory.                   Objective:  Physical Exam  Stoic amb wf nad on 02/ vital signs reviewed   Wt 184 01/21/2011  >08/27/2011 186 > 180 09/29/2011 > 10/07/2011  183 > 11/11/2011  181 > 01/13/2012  183 >180 02/12/2012 > 06/09/2012 188 > 189 10/12/2012 >188 01/14/2013 > 01/21/2013 187 > 03/02/2013 >184 03/18/2013 >  03/28/2013 186> 06/06/2013  182 >  07/18/2013  179 >181 09/27/2013 > 12/08/2013   183 > 02/24/2014  178 > 06/09/2014 177 > 09/07/2014 175   HEENT mod non-specific turbinate edema.  Oropharynx no thrush or excess pnd or cobblestoning.  No JVD or cervical adenopathy. Mild accessory muscle hypertrophy. Trachea midline, nl thryroid. Chest was hyperinflated by percussion with diminished breath sound Regular rate and rhythm without murmur gallop or rub or increase P2 or edema.  Abd: no hsm, nl excursion. Ext warm without cyanosis or clubbing.            Assessment:

## 2014-09-07 NOTE — Patient Instructions (Signed)

## 2014-09-10 ENCOUNTER — Encounter: Payer: Self-pay | Admitting: Internal Medicine

## 2014-09-10 NOTE — Assessment & Plan Note (Signed)
-   PFT's 06/11/2007  FEV1   0.70 (34%) ratio 32 with 49% improvement p B2    - HFA 75% effective p coaching 06/20/2011 > 50% 06/24/2011  -Med calendar 09/27/2013 > did not bring as requested 06/09/14  - improved reconciliation 09/07/2014   She has severe disease but is relatively stable on a complex regimen that she is now able to follow better because of the help of her family and includes [place] and inhaled steroids but no lama or laba at this point   I had an extended discussion with the patient and her daugthers  reviewing all relevant studies completed to date and  lasting 15 to 20 minutes of a 25 minute visit    Each maintenance medication was reviewed in detail including most importantly the difference between maintenance and prns and under what circumstances the prns are to be triggered using an action plan format that is not reflected in the computer generated alphabetically organized AVS.    Please see instructions for details which were reviewed in writing and the patient given a copy highlighting the part that I personally wrote and discussed at today's ov.

## 2014-09-10 NOTE — Assessment & Plan Note (Signed)
-   sats 90% RA 03/11/2011     - Dr Annamaria Boots started 02 around 2010    - 3 lpm 24/7 except 4lpm with activity as of 03/02/2013     - HC03 trending in low 30's since 2012 so likely hypercarbic component as well - 02/24/2014   Walked RA x one lap @ 185 stopped due to  desat to 83% corrected on 4lpm    .Adequate control on present rx, reviewed > no change in rx needed   rx as of 09/07/2014  = 2lpm 24/7 except 4lpm when walking more than room to room

## 2014-09-15 ENCOUNTER — Other Ambulatory Visit: Payer: Self-pay | Admitting: Internal Medicine

## 2014-09-15 MED ORDER — RANITIDINE HCL 150 MG PO TABS: 150.0000 mg | ORAL_TABLET | Freq: Every day | ORAL | Status: DC

## 2014-09-18 ENCOUNTER — Telehealth: Payer: Self-pay | Admitting: Internal Medicine

## 2014-09-18 ENCOUNTER — Other Ambulatory Visit: Payer: Self-pay | Admitting: *Deleted

## 2014-09-18 NOTE — Telephone Encounter (Signed)
Last filled 08/22/14, last seen 06/28/14. Also, no labs since 05/2013. Call into [facility]

## 2014-09-18 NOTE — Telephone Encounter (Signed)
Patient is having her medications "blister packed" at [facility] and needs rx sent in for Zantac.  Pharmacy notified that Rx has been sent in on 09/15/14.  Pharmacist requesting 90 day with 3 refills instead of 30 days with 11 refills.  Ok'd 90 day rx.    Nothing further needed.

## 2014-09-19 MED ORDER — ATORVASTATIN CALCIUM 40 MG PO TABS: ORAL_TABLET | ORAL | Status: DC

## 2014-09-19 MED ORDER — ALPRAZOLAM 1 MG PO TABS: 1.0000 mg | ORAL_TABLET | Freq: Two times a day (BID) | ORAL | Status: DC | PRN

## 2014-09-19 NOTE — Telephone Encounter (Signed)
Both refills called to [facility] in [place], [place]. Unable to delete the [facility] from [employer]

## 2014-09-20 DIAGNOSIS — J449 Chronic obstructive pulmonary disease, unspecified: Secondary | ICD-10-CM | POA: Diagnosis not present

## 2014-09-22 ENCOUNTER — Other Ambulatory Visit: Payer: Self-pay | Admitting: Internal Medicine

## 2014-09-22 DIAGNOSIS — J449 Chronic obstructive pulmonary disease, unspecified: Secondary | ICD-10-CM | POA: Diagnosis not present

## 2014-10-16 ENCOUNTER — Other Ambulatory Visit: Payer: Self-pay | Admitting: Family Medicine

## 2014-10-17 ENCOUNTER — Other Ambulatory Visit: Payer: Self-pay | Admitting: Family Medicine

## 2014-10-17 NOTE — Telephone Encounter (Signed)
No labs since 05/10/13

## 2014-10-17 NOTE — Telephone Encounter (Signed)
Last filled 09/19/14, last seen 06/28/14, call in at [facility] in [place]

## 2014-10-18 NOTE — Telephone Encounter (Signed)
Rx called into pharmacy and left on voicemail, pt is aware.

## 2014-10-19 ENCOUNTER — Ambulatory Visit (INDEPENDENT_AMBULATORY_CARE_PROVIDER_SITE_OTHER): Payer: Medicare Other

## 2014-10-19 DIAGNOSIS — Z23 Encounter for immunization: Secondary | ICD-10-CM | POA: Diagnosis not present

## 2014-10-19 NOTE — Progress Notes (Addendum)
Prevnar 13 and HD fluzone given pt tolerated well

## 2014-10-19 NOTE — Addendum Note (Signed)
Addended by: Louann Sjogren A on: 10/19/2014 12:08 PM   Modules accepted: Orders

## 2014-10-20 DIAGNOSIS — J449 Chronic obstructive pulmonary disease, unspecified: Secondary | ICD-10-CM | POA: Diagnosis not present

## 2014-10-22 DIAGNOSIS — J449 Chronic obstructive pulmonary disease, unspecified: Secondary | ICD-10-CM | POA: Diagnosis not present

## 2014-10-30 ENCOUNTER — Other Ambulatory Visit: Payer: Self-pay | Admitting: Internal Medicine

## 2014-11-09 DIAGNOSIS — J449 Chronic obstructive pulmonary disease, unspecified: Secondary | ICD-10-CM | POA: Diagnosis not present

## 2014-11-13 ENCOUNTER — Other Ambulatory Visit: Payer: Self-pay | Admitting: Family Medicine

## 2014-11-20 ENCOUNTER — Other Ambulatory Visit: Payer: Self-pay | Admitting: Family Medicine

## 2014-11-20 DIAGNOSIS — J449 Chronic obstructive pulmonary disease, unspecified: Secondary | ICD-10-CM | POA: Diagnosis not present

## 2014-11-21 ENCOUNTER — Other Ambulatory Visit: Payer: Self-pay | Admitting: Family Medicine

## 2014-11-21 ENCOUNTER — Telehealth: Payer: Self-pay | Admitting: Family Medicine

## 2014-11-21 MED ORDER — ALPRAZOLAM 1 MG PO TABS: 1.0000 mg | ORAL_TABLET | Freq: Two times a day (BID) | ORAL | Status: DC

## 2014-11-21 NOTE — Telephone Encounter (Signed)
Last seen 06/28/14  Dr Sabra Heck If approved route to nurse to call into [facility]

## 2014-11-21 NOTE — Telephone Encounter (Signed)
Last seen 6./22/16 Dr Sabra Heck   If approved route to nurse to call into [place]  [phone]

## 2014-11-21 NOTE — Telephone Encounter (Signed)
Last sen 06/08/14  Dr Sabra Heck  Last lipid 05/11/14

## 2014-11-21 NOTE — Telephone Encounter (Signed)
Last labs 05/20/2013

## 2014-11-21 NOTE — Telephone Encounter (Signed)
Rx refilled per patient request 

## 2014-11-22 DIAGNOSIS — J449 Chronic obstructive pulmonary disease, unspecified: Secondary | ICD-10-CM | POA: Diagnosis not present

## 2014-11-22 NOTE — Telephone Encounter (Signed)
Xanax refill called to [facility].

## 2014-12-05 ENCOUNTER — Ambulatory Visit (INDEPENDENT_AMBULATORY_CARE_PROVIDER_SITE_OTHER): Payer: Medicare Other | Admitting: Internal Medicine

## 2014-12-05 ENCOUNTER — Encounter: Payer: Self-pay | Admitting: Internal Medicine

## 2014-12-05 VITALS — BP 122/60 | HR 70 | Ht 64.0 in | Wt 172.8 lb

## 2014-12-05 DIAGNOSIS — J449 Chronic obstructive pulmonary disease, unspecified: Secondary | ICD-10-CM | POA: Diagnosis not present

## 2014-12-05 DIAGNOSIS — J9612 Chronic respiratory failure with hypercapnia: Secondary | ICD-10-CM

## 2014-12-05 MED ORDER — AZITHROMYCIN 250 MG PO TABS
ORAL_TABLET | ORAL | Status: DC
Start: 2014-12-05 — End: 2015-02-08

## 2014-12-05 NOTE — Assessment & Plan Note (Signed)
Complicated by hbp   Body mass index is 29.65   So trending down  Lab Results  Component Value Date   TSH 5.45 07/03/2010      Contributing to gerd tendency/ doe/reviewed the need and the process to achieve and maintain neg calorie balance > defer f/u primary care including intermittently monitoring thyroid status

## 2014-12-05 NOTE — Progress Notes (Signed)
Subjective:     Patient ID: AUBRIA AMMIRATI, female   DOB: Jul 02, 1928    MRN: [id number]   Brief patient profile:  71 yowf quit smoking 1992 with GOLD III copd dx 2009 eval in pulm clinic p hospitalized at [facility] 12/21/10     Admit date: 12/21/2010  Discharge date: 12/24/2010  C O P D  ALLERGIC RHINITIS  DYSPNEA  G E R D  Aortic stenosis  Insomnia  Leukocytosis    History of Present Illness  09/27/2013 Follow up and Med review  Hx of COPD III/ 02 dep/ has med  Patient returns for followup and medication review. We reviewed all her medications and organized them into a medication calendar with patient education. Does get easily confused with meds. Appears family is helping with meds now.  Recently dx with dementia, now on Namenda. Seems to be some improvement with memory.  rec No change rx/ follow med calendar and bring to each visit/ daughter [name]     12/08/2013  F/u   office visit/ [name]  / no med calendar/ no daughter  Chief Complaint  Patient presents with  . Follow-up    Pt states that overall her breathing is doing well.  She denies ane new co's today.   [name] is putting out all the medications for a week each Sunday including neb solutions  Has not needed ventolin   At all, nor any of her prns though she again struggled with the action plan portion of the calendar  rec No change rx    02/24/2014 f/u ov/Takaya Hyslop re: GOLD III / 02 dep  Chief Complaint  Patient presents with  . Follow-up    Pt states breathing is unchanged since last visit. She just gets anxious at times, her spouse has been sick. She does get sob with exhertion.  tamera with pt/ not clear she's increasing 02 to 4lpm with activity as rec nor able to use the action plans at the bottom of the med calendar Able to walk up to a hundred feet flat ok on 3lpm  rec Follow med calendar    06/09/2014 f/u ov/Chakia Counts re: GOLD III copd/ 02 dep/ no med calendar (brought by daughter who is POA and is not her pill  Environmental education officer) Chief Complaint  Patient presents with  . Follow-up    Pt states that her breathing is unchanged. No new co's today.   02 not clear what it's set on at home but pt thinks it's 3lpm but takes it off freqently  No change doe x more than slow adls rec Late instructions per med calendar :  3lpm 24/7 but 4lpm with exertion    09/07/2014 f/u ov/Korin Setzler re: copd gold III pfts/ chronic resp failure  Chief Complaint  Patient presents with  . Follow-up    Pt c/o SOB early mornings and with activity. Denies any chest tightness, wheezing, and cough.   has titrated oxygen at 2 L/m 24/7 but 4 L with activity Sems  to be doing better on maintenance therapy but still having trouble understanding the action plan Works. Her family is taking an active role in her medication administration which seems to be helping more than anything else. Doe = MMRC2/3  = can't walk a nl pace on a flat grade s sob even on 02  rec Follow med calendar   12/05/2014  f/u ov/Royce Sciara re: GOLD III copd/ 02 dep resp failure / no med calendar  Chief Complaint  Patient presents with  .  Follow-up    Pt states that her breathing has been doing well. No new co's today.   doe on 02 3lpm pulsed  = MMRC3 = can't walk 100 yards even at a slow pace at a flat grade s stopping due to sob    No obvious day to day or daytime variabilty or assoc excess/ purulent  mucus production  or cp or chest tightness, subjective wheeze overt sinus or hb symptoms. No unusual exp hx or h/o childhood pna/ asthma or knowledge of premature birth.  Sleeping ok without nocturnal  or early am exacerbation  of respiratory  c/o's or need for noct saba. Also denies any obvious fluctuation of symptoms with weather or environmental changes or other aggravating or alleviating factors except as outlined above   Current Medications, Allergies, Complete Past Medical History, Past Surgical History, Family History, and Social History were reviewed in [employer] record.  ROS  The following are not active complaints unless bolded sore throat, dysphagia, dental problems, itching, sneezing,  nasal congestion or excess/ purulent secretions, ear ache,   fever, chills, sweats, unintended wt loss, pleuritic or exertional cp, hemoptysis,  orthopnea pnd or leg swelling, presyncope, palpitations, heartburn, abdominal pain, anorexia, nausea, vomiting, diarrhea  or change in bowel or urinary habits, change in stools or urine, dysuria,hematuria,  rash, arthralgias, visual complaints, headache, numbness weakness or ataxia or problems with walking or coordination,  change in mood/affect or memory.                   Objective:  Physical Exam  Stoic amb wf nad on 02/ vital signs reviewed   Wt 184 01/21/2011  >08/27/2011 186 > 180 09/29/2011 > 10/07/2011  183 > 11/11/2011  181 > 01/13/2012  183 >180 02/12/2012 > 06/09/2012 188 > 189 10/12/2012 >188 01/14/2013 > 01/21/2013 187 > 03/02/2013 >184 03/18/2013 >  03/28/2013 186> 06/06/2013  182 >  07/18/2013  179 >181 09/27/2013 > 12/08/2013   183 > 02/24/2014  178 > 06/09/2014 177 > 09/07/2014 175 > 12/05/2014   173   HEENT mod non-specific turbinate edema.  Oropharynx no thrush or excess pnd or cobblestoning.  No JVD or cervical adenopathy. Mild accessory muscle hypertrophy. Trachea midline, nl thryroid. Chest was hyperinflated by percussion with diminished breath sounds bilaterally Regular rate and rhythm without murmur gallop or rub or increase P2 or edema.  Abd: no hsm, nl excursion. Ext warm without cyanosis or clubbing.            Assessment:

## 2014-12-05 NOTE — Patient Instructions (Addendum)
See calendar for specific medication instructions and bring it back for each and every office visit for every healthcare provider you see.  Without it,  you may not receive the best quality medical care that we feel you deserve.  You will note that the calendar groups together  your maintenance  medications that are timed at particular times of the day.  Think of this as your checklist for what your doctor has instructed you to do until your next evaluation to see what benefit  there is  to staying on a consistent group of medications intended to keep you well.  The other group at the bottom is entirely up to you to use as you see fit  for specific symptoms that may arise between visits that require you to treat them on an as needed basis.  Think of this as your action plan or "what if" list.   Separating the top medications from the bottom group is fundamental to providing you adequate care going forward.    Please schedule a follow up visit in 3 months but call sooner if needed  Add zpak if mucus turns dark

## 2014-12-06 ENCOUNTER — Encounter: Payer: Self-pay | Admitting: Internal Medicine

## 2014-12-06 NOTE — Assessment & Plan Note (Signed)
-   sats 90% RA 03/11/2011     - Dr Annamaria Boots started 02 around 2010    - 3 lpm 24/7 except 4lpm with activity as of 03/02/2013     - HC03 trending in low 30's since 2012 so likely hypercarbic component as well   - 02/24/2014   Walked RA x one lap @ 185 stopped due to  desat to 83% corrected on 4lpm    - 12/05/2014   Walked 3lpm pulsed x 2 laps @ 185 ft each stopped due to sob with sat 89% at moderate pace      rx as of 12/05/2014  = 2lpm 24/7 except 3lpm when walking more than room to room

## 2014-12-06 NOTE — Assessment & Plan Note (Signed)
-   PFT's 06/11/2007  FEV1   0.70 (34%) ratio 32 with 49% improvement p B2    - HFA 75% effective p coaching 06/20/2011 > 50% 06/24/2011  -Med calendar 09/27/2013 > did not bring as requested 06/09/14  - improved reconciliation 09/07/2014    Presently using blister packs for med organization which is fine for maint rx but not for prns and again has no med calendar which also includes a very user friendly action plan at the bottom   I did rec zpak for any flare of purulent sputum    I had an extended discussion with the patient reviewing all relevant studies completed to date and  lasting 15 to 20 minutes of a 25 minute visit    Each maintenance medication was reviewed in detail including most importantly the difference between maintenance and prns and under what circumstances the prns are to be triggered using an action plan format that is not reflected in the computer generated alphabetically organized AVS.    Please see instructions for details which were reviewed in writing and the patient given a copy highlighting the part that I personally wrote and discussed at today's ov.

## 2014-12-19 DIAGNOSIS — J449 Chronic obstructive pulmonary disease, unspecified: Secondary | ICD-10-CM | POA: Diagnosis not present

## 2014-12-20 ENCOUNTER — Other Ambulatory Visit: Payer: Self-pay | Admitting: Family Medicine

## 2014-12-20 DIAGNOSIS — J449 Chronic obstructive pulmonary disease, unspecified: Secondary | ICD-10-CM | POA: Diagnosis not present

## 2014-12-20 NOTE — Telephone Encounter (Signed)
Last seen 06/28/14  Dr Sabra Heck If approved route to nurse to call into [facility]

## 2014-12-21 NOTE — Telephone Encounter (Signed)
rx called into pharmacy

## 2014-12-22 ENCOUNTER — Other Ambulatory Visit: Payer: Self-pay | Admitting: Family Medicine

## 2014-12-22 DIAGNOSIS — J449 Chronic obstructive pulmonary disease, unspecified: Secondary | ICD-10-CM | POA: Diagnosis not present

## 2014-12-22 NOTE — Telephone Encounter (Signed)
Refill was given on 12/20/14 for quantity 60 of Xanax.  Patient wants to know if we can put a 3 month refill order on file for her.

## 2014-12-23 NOTE — Telephone Encounter (Signed)
No labs since 05/15

## 2014-12-27 ENCOUNTER — Ambulatory Visit: Payer: Self-pay | Admitting: Family Medicine

## 2014-12-27 MED ORDER — ALPRAZOLAM 1 MG PO TABS: ORAL_TABLET | ORAL | Status: DC

## 2014-12-28 ENCOUNTER — Encounter: Payer: Self-pay | Admitting: Family Medicine

## 2014-12-28 MED ORDER — ALPRAZOLAM 1 MG PO TABS: ORAL_TABLET | ORAL | Status: DC

## 2014-12-28 NOTE — Addendum Note (Signed)
Addended by: Zannie Cove on: 12/28/2014 08:35 AM   Modules accepted: Orders

## 2014-12-28 NOTE — Telephone Encounter (Signed)
Phoned to [facility] rx - aware not to fill until 01-20-15.

## 2015-01-20 DIAGNOSIS — J449 Chronic obstructive pulmonary disease, unspecified: Secondary | ICD-10-CM | POA: Diagnosis not present

## 2015-01-22 ENCOUNTER — Other Ambulatory Visit: Payer: Self-pay | Admitting: Family Medicine

## 2015-01-22 ENCOUNTER — Other Ambulatory Visit: Payer: Self-pay | Admitting: Cardiology

## 2015-01-22 DIAGNOSIS — J449 Chronic obstructive pulmonary disease, unspecified: Secondary | ICD-10-CM | POA: Diagnosis not present

## 2015-01-22 NOTE — Telephone Encounter (Signed)
Rx request sent to pharmacy.  

## 2015-01-23 NOTE — Telephone Encounter (Signed)
Last seen 06/28/14 Dr Miller  Last lipid 05/10/13 

## 2015-02-08 ENCOUNTER — Encounter: Payer: Self-pay | Admitting: Family Medicine

## 2015-02-08 ENCOUNTER — Ambulatory Visit (INDEPENDENT_AMBULATORY_CARE_PROVIDER_SITE_OTHER): Payer: Medicare Other | Admitting: Family Medicine

## 2015-02-08 VITALS — BP 143/65 | HR 71 | Temp 97.1°F | Ht 64.0 in | Wt 173.0 lb

## 2015-02-08 DIAGNOSIS — J441 Chronic obstructive pulmonary disease with (acute) exacerbation: Secondary | ICD-10-CM | POA: Diagnosis not present

## 2015-02-08 DIAGNOSIS — I10 Essential (primary) hypertension: Secondary | ICD-10-CM | POA: Diagnosis not present

## 2015-02-08 NOTE — Progress Notes (Signed)
Subjective:    Patient ID: Katie Bryant, female    DOB: November 26, 1928, 82 y.o.   MRN: [id number]  HPI Patient here today to discuss recent anxiety and stress. She lost her husband back in June.   Patient is here today with "personal issues". She has 3 daughters. One lives here helps her manage affairs but Gili is not doing well with her lack of independence. I am guessing that the daughter feels like she needs supervision. She is on a peel, Namenda for dementia. In talking to her at length she does ramble and forgets her train of thought but we did a mental status exam on her today and she scored 26 of 30. This suggests some mild dementia but certainly she can function independently. She does not want another medicine to help handle the anxiety or stress. I believe part of her stress is still recovering from loss of her husband 6 months ago.    Patient Active Problem List   Diagnosis Date Noted  . Right hamstring muscle strain 04/26/2013  . Pneumonia 02/25/2013  . PNA (pneumonia) 02/25/2013  . COPD exacerbation ([place]) 01/18/2013  . Lymphadenopathy 10/07/2012  . Chronic respiratory failure with hypercapnia ([place]) 03/11/2011  . HTN (hypertension) 02/12/2011  . Hyperlipidemia 02/12/2011  . Pneumonia, organism unspecified 01/21/2011  . Leukocytosis 12/23/2010  . Insomnia 12/22/2010  . Aortic stenosis 07/03/2010  . Chronic rhinitis 07/03/2007  . G E R D 06/06/2007  . Morbid obesity ([place]) 05/25/2007  . COPD GOLD III/ 02 dep  05/25/2007   Outpatient Encounter Prescriptions as of 02/08/2015  Medication Sig  . acetaminophen (TYLENOL) 500 MG tablet Take 500 mg by mouth every 6 (six) hours as needed.  Marland Kitchen albuterol (PROVENTIL) (2.5 MG/3ML) 0.083% nebulizer solution Take 3 mLs (2.5 mg total) by nebulization every 6 (six) hours as needed for wheezing or shortness of breath.  . ALPRAZolam (XANAX) 1 MG tablet TAKE  (1)  TABLET TWICE A DAY.  Marland Kitchen aspirin 81 MG tablet Take 81 mg by mouth daily.  Marland Kitchen  atorvastatin (LIPITOR) 40 MG tablet TAKE 1 TABLET ONCE DAILY AT 6PM  . bisacodyl (DULCOLAX) 5 MG EC tablet Take 5 mg by mouth daily as needed for moderate constipation.  . budesonide (PULMICORT) 0.5 MG/2ML nebulizer solution USE ONE VIAL 2 TIMES DAILY VIA NEBULIZER  . buPROPion (WELLBUTRIN SR) 150 MG 12 hr tablet Take 200 mg by mouth 2 (two) times daily.   . chlorpheniramine (CHLOR-TRIMETON) 4 MG tablet Take 8 mg by mouth every 6 (six) hours as needed.   . dimenhyDRINATE (DRAMAMINE) 50 MG tablet Take 50 mg by mouth every 8 (eight) hours as needed.  . docusate sodium (COLACE) 100 MG capsule Take 100 mg by mouth daily as needed for mild constipation.  . fenofibrate micronized (LOFIBRA) 134 MG capsule TAKE ONE CAPSULE BY MOUTH ONCE DAILY BEFORE BREAKFAST  . guaifenesin (ROBITUSSIN) 100 MG/5ML syrup Take 200 mg by mouth 3 (three) times daily as needed for cough.  Marland Kitchen ipratropium-albuterol (DUONEB) 0.5-2.5 (3) MG/3ML SOLN USE ONE VIAL 4 TIMES DAILY VIA NEBULIZER  . ketotifen (ZADITOR) 0.025 % ophthalmic solution Place 1 drop into both eyes as needed.  . metoprolol tartrate (LOPRESSOR) 25 MG tablet TAKE (1/2) TABLET TWICE DAILY.  . montelukast (SINGULAIR) 10 MG tablet TAKE ONE TABLET AT BEDTIME  . NAMENDA XR 28 MG CP24 Take 1 capsule by mouth daily.   . OXYGEN Oxygen 3lpm 24/7  . PROAIR HFA 108 (90 BASE) MCG/ACT inhaler INHALE TWO  PUFFS BY MOUTH EVERY 6 HOURS AS NEEDED  . ranitidine (ZANTAC) 150 MG tablet Take 1 tablet (150 mg total) by mouth at bedtime.  Marland Kitchen Respiratory Therapy Supplies (FLUTTER) DEVI As needed  . sodium chloride (OCEAN) 0.65 % SOLN nasal spray 2 puffs every 4-6 hours as needed for nasal congestion  . valsartan-hydrochlorothiazide (DIOVAN-HCT) 80-12.5 MG per tablet Take 1 tablet by mouth daily.  . [DISCONTINUED] azithromycin (ZITHROMAX) 250 MG tablet Take 2 on day one then 1 daily x 4 days  . [DISCONTINUED] benzonatate (TESSALON) 200 MG capsule Take 1 capsule (200 mg total) by mouth 2  (two) times daily as needed for cough. (Patient not taking: Reported on 09/07/2014)   No facility-administered encounter medications on file as of 02/08/2015.     Review of Systems  Constitutional: Negative.   HENT: Negative.   Eyes: Negative.   Respiratory: Negative.   Cardiovascular: Negative.   Gastrointestinal: Negative.   Endocrine: Negative.   Genitourinary: Negative.   Musculoskeletal: Negative.   Skin: Negative.   Allergic/Immunologic: Negative.   Neurological: Negative.   Hematological: Negative.   Psychiatric/Behavioral: The patient is nervous/anxious.    MMSE done today and pt scores 26 of 30.    Objective:   Physical Exam  Constitutional: She is oriented to person, place, and time. She appears well-developed and well-nourished.  Cardiovascular: Normal rate.   Pulmonary/Chest: Effort normal and breath sounds normal.  Neurological: She is alert and oriented to person, place, and time.  Psychiatric: She has a normal mood and affect. Her behavior is normal. Judgment and thought content normal.   BP 143/65 mmHg  Pulse 71  Temp(Src) 97.1 F (36.2 C) (Oral)  Ht 5\' 4"  (1.626 m)  Wt 173 lb (78.472 kg)  BMI 29.68 kg/m2        Assessment & Plan:  1. Essential hypertension Blood pressure well controlled on current regimen  2. COPD exacerbation [facility]) She sees Dr. Shyrl Numbers pulmonologist regularly. No recent exacerbations. She uses oxygen all the time. Does not appear dyspneic or short of breath today  Wardell Honour MD

## 2015-02-14 ENCOUNTER — Other Ambulatory Visit: Payer: Self-pay | Admitting: Internal Medicine

## 2015-02-20 ENCOUNTER — Other Ambulatory Visit: Payer: Self-pay | Admitting: Family Medicine

## 2015-02-20 DIAGNOSIS — J449 Chronic obstructive pulmonary disease, unspecified: Secondary | ICD-10-CM | POA: Diagnosis not present

## 2015-02-20 NOTE — Telephone Encounter (Signed)
Last seen 02/08/15 Dr Sabra Heck   Last lipid 05/20/13

## 2015-02-22 DIAGNOSIS — J449 Chronic obstructive pulmonary disease, unspecified: Secondary | ICD-10-CM | POA: Diagnosis not present

## 2015-02-28 DIAGNOSIS — J449 Chronic obstructive pulmonary disease, unspecified: Secondary | ICD-10-CM | POA: Diagnosis not present

## 2015-03-06 ENCOUNTER — Ambulatory Visit (INDEPENDENT_AMBULATORY_CARE_PROVIDER_SITE_OTHER): Payer: Medicare Other | Admitting: Internal Medicine

## 2015-03-06 ENCOUNTER — Encounter: Payer: Self-pay | Admitting: Internal Medicine

## 2015-03-06 VITALS — BP 128/72 | HR 53 | Ht 64.0 in | Wt 173.0 lb

## 2015-03-06 DIAGNOSIS — J9612 Chronic respiratory failure with hypercapnia: Secondary | ICD-10-CM | POA: Diagnosis not present

## 2015-03-06 DIAGNOSIS — J449 Chronic obstructive pulmonary disease, unspecified: Secondary | ICD-10-CM | POA: Diagnosis not present

## 2015-03-06 NOTE — Patient Instructions (Signed)
See calendar for specific medication instructions and bring it back for each and every office visit for every healthcare provider you see.  Without it,  you may not receive the best quality medical care that we feel you deserve.  You will note that the calendar groups together  your maintenance  medications that are timed at particular times of the day.  Think of this as your checklist for what your doctor has instructed you to do until your next evaluation to see what benefit  there is  to staying on a consistent group of medications intended to keep you well.  The other group at the bottom is entirely up to you to use as you see fit  for specific symptoms that may arise between visits that require you to treat them on an as needed basis.  Think of this as your action plan or "what if" list.   Separating the top medications from the bottom group is fundamental to providing you adequate care going forward.     Please schedule a follow up visit in 3 months but call sooner if needed to see Tammy Np for new med calendar - bring all medications with you in 2 bags, the ones you take perfectly regularly vs the as neededs

## 2015-03-06 NOTE — Progress Notes (Signed)
Subjective:     Patient ID: Katie Bryant, female   DOB: February 09, 1928    MRN: [id number]   Brief patient profile:  10 yowf quit smoking 1992 with GOLD III copd dx 2009 eval in pulm clinic p hospitalized at [facility] 12/21/10     Admit date: 12/21/2010  Discharge date: 12/24/2010  C O P D  ALLERGIC RHINITIS  DYSPNEA  G E R D  Aortic stenosis  Insomnia  Leukocytosis    History of Present Illness  09/27/2013 Follow up and Med review  Hx of COPD III/ 02 dep/ has med  Patient returns for followup and medication review. We reviewed all her medications and organized them into a medication calendar with patient education. Does get easily confused with meds. Appears family is helping with meds now.  Recently dx with dementia, now on Namenda. Seems to be some improvement with memory.  rec No change rx/ follow med calendar and bring to each visit/ daughter Katie Bryant     12/08/2013  F/u   office visit/ Katie Bryant  / no med calendar/ no daughter  Chief Complaint  Patient presents with  . Follow-up    Pt states that overall her breathing is doing well.  She denies ane new co's today.   Katie Bryant is putting out all the medications for a week each Sunday including neb solutions  Has not needed ventolin   At all, nor any of her prns though she again struggled with the action plan portion of the calendar  rec No change rx    02/24/2014 f/u ov/Katie Bryant re: GOLD III / 02 dep  Chief Complaint  Patient presents with  . Follow-up    Pt states breathing is unchanged since last visit. She just gets anxious at times, her spouse has been sick. She does get sob with exhertion.  tamera with pt/ not clear she's increasing 02 to 4lpm with activity as rec nor able to use the action plans at the bottom of the med calendar Able to walk up to a hundred feet flat ok on 3lpm  rec Follow med calendar    06/09/2014 f/u ov/Katie Bryant re: GOLD III copd/ 02 dep/ no med calendar (brought by daughter who is POA and is not her pill  Environmental education officer) Chief Complaint  Patient presents with  . Follow-up    Pt states that her breathing is unchanged. No new co's today.   02 not clear what it's set on at home but pt thinks it's 3lpm but takes it off freqently  No change doe x more than slow adls rec Late instructions per med calendar :  3lpm 24/7 but 4lpm with exertion    09/07/2014 f/u ov/Katie Bryant re: copd gold III pfts/ chronic resp failure  Chief Complaint  Patient presents with  . Follow-up    Pt c/o SOB early mornings and with activity. Denies any chest tightness, wheezing, and cough.   has titrated oxygen at 2 L/m 24/7 but 4 L with activity Sems  to be doing better on maintenance therapy but still having trouble understanding the action plan Works. Her family is taking an active role in her medication administration which seems to be helping more than anything else. Doe = MMRC2/3  = can't walk a nl pace on a flat grade s sob even on 02  rec Follow med calendar    03/06/2015  f/u ov/Katie Bryant re: copd gold III / 02 dep 3lpm x 4 with activity   Chief Complaint  Patient presents with  .  Follow-up    Breathing overall doing well. She is using albuterol inhaler almost every day.   doe on 31 = MMRC2 = can't walk a nl pace on a flat grade s sob - does ok at slow pace, flat grade at 4lpm    No obvious day to day or daytime variabilty or assoc excess/ purulent  mucus production  or cp or chest tightness, subjective wheeze overt sinus or hb symptoms. No unusual exp hx or h/o childhood pna/ asthma or knowledge of premature birth.  Sleeping ok without nocturnal  or early am exacerbation  of respiratory  c/o's or need for noct saba. Also denies any obvious fluctuation of symptoms with weather or environmental changes or other aggravating or alleviating factors except as outlined above   Current Medications, Allergies, Complete Past Medical History, Past Surgical History, Family History, and Social History were reviewed in [employer] record.  ROS  The following are not active complaints unless bolded sore throat, dysphagia, dental problems, itching, sneezing,  nasal congestion or excess/ purulent secretions, ear ache,   fever, chills, sweats, unintended wt loss, pleuritic or exertional cp, hemoptysis,  orthopnea pnd or leg swelling, presyncope, palpitations, heartburn, abdominal pain, anorexia, nausea, vomiting, diarrhea  or change in bowel or urinary habits, change in stools or urine, dysuria,hematuria,  rash, arthralgias, visual complaints, headache, numbness weakness or ataxia or problems with walking or coordination,  change in mood/affect or memory.                   Objective:  Physical Exam  Stoic amb wf nad on 02/ vital signs reviewed/ wearing lipstick for first time looking quite sharp/optimistic vs previous ov's   Wt 184 01/21/2011  >08/27/2011 186 > 180 09/29/2011 > 10/07/2011  183 > 11/11/2011  181 > 01/13/2012  183 >180 02/12/2012 > 06/09/2012 188 > 189 10/12/2012 >188 01/14/2013 > 01/21/2013 187 > 03/02/2013 >184 03/18/2013 >  03/28/2013 186> 06/06/2013  182 >  07/18/2013  179 >181 09/27/2013 > 12/08/2013   183 > 02/24/2014  178 > 06/09/2014 177 > 09/07/2014 175 > 12/05/2014   173 > 03/06/2015 173   HEENT mod non-specific turbinate edema.  Oropharynx no thrush or excess pnd or cobblestoning.  No JVD or cervical adenopathy. Mild accessory muscle hypertrophy. Trachea midline, nl thryroid. Chest was hyperinflated by percussion with diminished breath sounds bilaterally s wheeze/ Regular rate and rhythm without murmur gallop or rub or increase P2 or edema.  Abd: no hsm, nl excursion. Ext warm without cyanosis or clubbing.            Assessment:

## 2015-03-07 ENCOUNTER — Telehealth: Payer: Self-pay | Admitting: Internal Medicine

## 2015-03-07 MED ORDER — ALBUTEROL SULFATE HFA 108 (90 BASE) MCG/ACT IN AERS: INHALATION_SPRAY | RESPIRATORY_TRACT | Status: DC

## 2015-03-07 NOTE — Assessment & Plan Note (Signed)
-   PFT's 06/11/2007  FEV1   0.70 (34%) ratio 32 with 49% improvement p B2    - HFA 75% effective p coaching 06/20/2011 > 50% 06/24/2011  -Med calendar 09/27/2013 > did not bring as requested 06/09/14  - improved reconciliation 09/07/2014 and 03/07/2015    she is finally stabilized on a complex regimen for which she has a medication calendar and appears to be using it more consistently.  I had an extended discussion with the patient reviewing all relevant studies completed to date and  lasting 15 to 20 minutes of a 25 minute visit    Each maintenance medication was reviewed in detail including most importantly the difference between maintenance and prns and under what circumstances the prns are to be triggered using an action plan format that is not reflected in the computer generated alphabetically organized AVS but trather by a customized med calendar that reflects the AVS meds with confirmed 100% correlation.   Please see instructions for details which were reviewed in writing and the patient given a copy highlighting the part that I personally wrote and discussed at today's ov.

## 2015-03-07 NOTE — Telephone Encounter (Signed)
Spoke with pt. Needs refill on ProAir. Rx has been sent in. Nothing further was needed.

## 2015-03-07 NOTE — Assessment & Plan Note (Signed)
-   sats 90% RA 03/11/2011     - Dr Annamaria Boots started 02 around 2010    - 3 lpm 24/7 except 4lpm with activity as of 03/02/2013     - HC03 trending in low 30's since 2012 so likely hypercarbic component as well   - 02/24/2014   Walked RA x one lap @ 185 stopped due to  desat to 83% corrected on 4lpm    - 12/05/2014   Walked 3lpm pulsed x 2 laps @ 185 ft each stopped due to sob with sat 89% at moderate pace    - 03/06/2015   Walked 4lpm 2 laps @ 185 ft each stopped due to  Sob/ desat to 86% at pace faster than her nl      rx as of 03/06/2015  = 3lpm 24/7 except 4lpm when walking more than room to room

## 2015-03-08 ENCOUNTER — Telehealth: Payer: Self-pay | Admitting: Family Medicine

## 2015-03-08 NOTE — Telephone Encounter (Signed)
Pt given appt tomorrow with Dr.Miller at 10:15.

## 2015-03-09 ENCOUNTER — Encounter: Payer: Self-pay | Admitting: Family Medicine

## 2015-03-09 ENCOUNTER — Ambulatory Visit (INDEPENDENT_AMBULATORY_CARE_PROVIDER_SITE_OTHER): Payer: Medicare Other | Admitting: Family Medicine

## 2015-03-09 VITALS — BP 138/64 | HR 64 | Temp 97.3°F | Ht 64.0 in | Wt 173.6 lb

## 2015-03-09 DIAGNOSIS — H9313 Tinnitus, bilateral: Secondary | ICD-10-CM

## 2015-03-09 NOTE — Patient Instructions (Signed)
Thank you for allowing us to care for you today. We strive to provide exceptional quality and compassionate care. Please let us know how we are doing and how we can help serve you better by filling out the survey that you receive from [employer].     

## 2015-03-09 NOTE — Progress Notes (Signed)
Subjective:    Patient ID: Katie Bryant, female    DOB: 11-Jul-1928, 82 y.o.   MRN: [id number]  HPI 82 year old female who is hearing some crickets in her ears. Symptoms are worse at night although they do occur in the daytime. She has a history of some hearing loss. There is also a history of some dementia but she still functions fairly well and still drives. We did some dementia testing at her last visit and I believe she has some mild dementia probably vascular. Is not likely to be Alzheimer's since the suspicion of dementia goes back some 6 years or so. Alzheimer's would've likely progressed much faster. She also has significant COPD and is followed by pulmonologist in [place].  Patient Active Problem List   Diagnosis Date Noted  . Right hamstring muscle strain 04/26/2013  . Pneumonia 02/25/2013  . PNA (pneumonia) 02/25/2013  . COPD exacerbation ([place]) 01/18/2013  . Lymphadenopathy 10/07/2012  . Chronic respiratory failure with hypercapnia ([place]) 03/11/2011  . HTN (hypertension) 02/12/2011  . Hyperlipidemia 02/12/2011  . Pneumonia, organism unspecified 01/21/2011  . Leukocytosis 12/23/2010  . Insomnia 12/22/2010  . Aortic stenosis 07/03/2010  . Chronic rhinitis 07/03/2007  . G E R D 06/06/2007  . Morbid obesity ([place]) 05/25/2007  . COPD GOLD III/ 02 dep  05/25/2007   Outpatient Encounter Prescriptions as of 03/09/2015  Medication Sig  . acetaminophen (TYLENOL) 500 MG tablet Take 500 mg by mouth every 6 (six) hours as needed.  Marland Kitchen albuterol (PROAIR HFA) 108 (90 Base) MCG/ACT inhaler INHALE TWO PUFFS BY MOUTH EVERY 6 HOURS AS NEEDED  . ALPRAZolam (XANAX) 1 MG tablet TAKE  (1)  TABLET TWICE A DAY.  Marland Kitchen aspirin 81 MG tablet Take 81 mg by mouth daily.  Marland Kitchen atorvastatin (LIPITOR) 40 MG tablet TAKE 1 TABLET ONCE DAILY AT 6PM  . bisacodyl (DULCOLAX) 5 MG EC tablet Take 5 mg by mouth daily as needed for moderate constipation.  . budesonide (PULMICORT) 0.5 MG/2ML nebulizer solution USE ONE  VIAL 2 TIMES DAILY VIA NEBULIZER  . buPROPion (WELLBUTRIN SR) 150 MG 12 hr tablet Take 200 mg by mouth 2 (two) times daily.   . fenofibrate micronized (LOFIBRA) 134 MG capsule TAKE ONE CAPSULE BY MOUTH ONCE DAILY BEFORE BREAKFAST  . fluticasone (FLONASE) 50 MCG/ACT nasal spray Place 2 sprays into both nostrils daily.  Marland Kitchen guaifenesin (ROBITUSSIN) 100 MG/5ML syrup Take 200 mg by mouth 3 (three) times daily as needed for cough.  Marland Kitchen ipratropium-albuterol (DUONEB) 0.5-2.5 (3) MG/3ML SOLN USE ONE VIAL 4 TIMES DAILY VIA NEBULIZER  . ketotifen (ZADITOR) 0.025 % ophthalmic solution Place 1 drop into both eyes as needed.  . metoprolol tartrate (LOPRESSOR) 25 MG tablet TAKE (1/2) TABLET TWICE DAILY.  . montelukast (SINGULAIR) 10 MG tablet TAKE ONE TABLET AT BEDTIME  . NAMENDA XR 28 MG CP24 Take 1 capsule by mouth daily.   . OXYGEN Oxygen 3lpm 24/7  . ranitidine (ZANTAC) 150 MG tablet Take 1 tablet (150 mg total) by mouth at bedtime.  Marland Kitchen Respiratory Therapy Supplies (FLUTTER) DEVI As needed  . sodium chloride (OCEAN) 0.65 % SOLN nasal spray 2 puffs every 4-6 hours as needed for nasal congestion  . valsartan-hydrochlorothiazide (DIOVAN-HCT) 80-12.5 MG tablet TAKE 1 TABLET DAILY  . chlorpheniramine (CHLOR-TRIMETON) 4 MG tablet Take 8 mg by mouth every 6 (six) hours as needed. Reported on 03/09/2015  . dimenhyDRINATE (DRAMAMINE) 50 MG tablet Take 50 mg by mouth every 8 (eight) hours as needed. Reported on 03/09/2015  .  docusate sodium (COLACE) 100 MG capsule Take 100 mg by mouth daily as needed for mild constipation. Reported on 03/09/2015   No facility-administered encounter medications on file as of 03/09/2015.      Review of Systems  HENT: Positive for hearing loss.   Respiratory: Positive for shortness of breath.        Objective:   Physical Exam  Constitutional: She is oriented to person, place, and time. She appears well-developed and well-nourished.  HENT:  Right Ear: External ear normal.  Left Ear:  External ear normal.  Patient was unable to perform Rennie or Weber tests  Neurological: She is alert and oriented to person, place, and time.  Psychiatric: She has a normal mood and affect. Her behavior is normal.          Assessment & Plan:  1. Tinnitus, bilateral This is probably a function of her hearing loss. Suggestions of this visit include a masking device or sound machine, take her alprazolam 1 mg 1 hour before bedtime. And also look into acquiring bioflavonoids OTC at pharmacy  Wardell Honour MD

## 2015-03-19 ENCOUNTER — Other Ambulatory Visit: Payer: Self-pay | Admitting: Family Medicine

## 2015-03-19 NOTE — Telephone Encounter (Signed)
No labs since 05/2013

## 2015-03-20 ENCOUNTER — Other Ambulatory Visit: Payer: Self-pay | Admitting: Family Medicine

## 2015-03-20 DIAGNOSIS — J449 Chronic obstructive pulmonary disease, unspecified: Secondary | ICD-10-CM | POA: Diagnosis not present

## 2015-03-22 ENCOUNTER — Ambulatory Visit: Payer: Self-pay | Admitting: Family Medicine

## 2015-03-22 DIAGNOSIS — J449 Chronic obstructive pulmonary disease, unspecified: Secondary | ICD-10-CM | POA: Diagnosis not present

## 2015-03-23 ENCOUNTER — Encounter: Payer: Self-pay | Admitting: Family Medicine

## 2015-03-29 ENCOUNTER — Encounter: Payer: Self-pay | Admitting: Family Medicine

## 2015-03-29 ENCOUNTER — Ambulatory Visit (INDEPENDENT_AMBULATORY_CARE_PROVIDER_SITE_OTHER): Payer: Medicare Other | Admitting: Family Medicine

## 2015-03-29 VITALS — BP 189/74 | HR 70 | Temp 97.9°F | Ht 64.0 in | Wt 174.4 lb

## 2015-03-29 DIAGNOSIS — F0280 Dementia in other diseases classified elsewhere without behavioral disturbance: Secondary | ICD-10-CM

## 2015-03-29 DIAGNOSIS — I10 Essential (primary) hypertension: Secondary | ICD-10-CM

## 2015-03-29 DIAGNOSIS — G309 Alzheimer's disease, unspecified: Secondary | ICD-10-CM | POA: Diagnosis not present

## 2015-03-29 HISTORY — DX: Dementia in other diseases classified elsewhere, unspecified severity, without behavioral disturbance, psychotic disturbance, mood disturbance, and anxiety: F02.80

## 2015-03-29 NOTE — Progress Notes (Signed)
BP 189/74 mmHg  Pulse 70  Temp(Src) 97.9 F (36.6 C) (Oral)  Ht 5\' 4"  (1.626 m)  Wt 174 lb 6.4 oz (79.107 kg)  BMI 29.92 kg/m2  SpO2 95%   Subjective:    Patient ID: Katie Bryant, female    DOB: [birth year], 82 y.o.   MRN: [id number]  HPI: NARE Katie Bryant is a 82 y.o. female presenting on 03/29/2015 for Establish Care   HPI Hypertension Patient's blood pressure is elevated today. She has this managed regularly by her cardiologist Dr. Archie Patten. Her blood pressure is 189/74 currently. She wants to wait to have him make any adjustments if needed. Patient denies headaches, blurred vision, chest pains, shortness of breath, or weakness. Denies any side effects from medication and is content with current medication.   Alzheimer's Patient has been on Namenda for dementia and feels like it is helping some. She is here by herself and appears to be fully intact memory-wise but she says she has been on it and would like to stay on it. She does not remember how long ago she started it but she does remember it was started by psychiatrist. She denies any major side effects from medication. She denies any major memory issues but does have some short-term memory loss.  Relevant past medical, surgical, family and social history reviewed and updated as indicated. Interim medical history since our last visit reviewed. Allergies and medications reviewed and updated.  Review of Systems  Constitutional: Negative for fever and chills.  HENT: Negative for congestion, ear discharge and ear pain.   Eyes: Negative for redness and visual disturbance.  Respiratory: Negative for chest tightness and shortness of breath.   Cardiovascular: Negative for chest pain and leg swelling.  Genitourinary: Negative for dysuria and difficulty urinating.  Musculoskeletal: Negative for back pain and gait problem.  Skin: Negative for rash.  Neurological: Negative for light-headedness and headaches.  Psychiatric/Behavioral:  Negative for suicidal ideas, hallucinations, behavioral problems, sleep disturbance, self-injury, decreased concentration and agitation. The patient is not nervous/anxious.   All other systems reviewed and are negative.   Per HPI unless specifically indicated above     Medication List       This list is accurate as of: 03/29/15  4:43 PM.  Always use your most recent med list.               acetaminophen 500 MG tablet  Commonly known as:  TYLENOL  Take 500 mg by mouth every 6 (six) hours as needed.     albuterol 108 (90 Base) MCG/ACT inhaler  Commonly known as:  PROAIR HFA  INHALE TWO PUFFS BY MOUTH EVERY 6 HOURS AS NEEDED     ALPRAZolam 1 MG tablet  Commonly known as:  XANAX  TAKE  (1)  TABLET TWICE A DAY.     aspirin 81 MG tablet  Take 81 mg by mouth daily.     atorvastatin 40 MG tablet  Commonly known as:  LIPITOR  TAKE 1 TABLET ONCE DAILY AT 6PM     bisacodyl 5 MG EC tablet  Commonly known as:  DULCOLAX  Take 5 mg by mouth daily as needed for moderate constipation.     budesonide 0.5 MG/2ML nebulizer solution  Commonly known as:  PULMICORT  USE ONE VIAL 2 TIMES DAILY VIA NEBULIZER     chlorpheniramine 4 MG tablet  Commonly known as:  CHLOR-TRIMETON  Take 8 mg by mouth every 6 (six) hours as needed. Reported on 03/09/2015  dimenhyDRINATE 50 MG tablet  Commonly known as:  DRAMAMINE  Take 50 mg by mouth every 8 (eight) hours as needed. Reported on 03/09/2015     docusate sodium 100 MG capsule  Commonly known as:  COLACE  Take 100 mg by mouth daily as needed for mild constipation. Reported on 03/09/2015     fenofibrate micronized 134 MG capsule  Commonly known as:  LOFIBRA  TAKE ONE CAPSULE BY MOUTH ONCE DAILY BEFORE BREAKFAST     fluticasone 50 MCG/ACT nasal spray  Commonly known as:  FLONASE  Place 2 sprays into both nostrils daily.     FLUTTER Devi  As needed     guaifenesin 100 MG/5ML syrup  Commonly known as:  ROBITUSSIN  Take 200 mg by mouth 3  (three) times daily as needed for cough.     ipratropium-albuterol 0.5-2.5 (3) MG/3ML Soln  Commonly known as:  DUONEB  USE ONE VIAL 4 TIMES DAILY VIA NEBULIZER     ketotifen 0.025 % ophthalmic solution  Commonly known as:  ZADITOR  Place 1 drop into both eyes as needed.     metoprolol tartrate 25 MG tablet  Commonly known as:  LOPRESSOR  TAKE (1/2) TABLET TWICE DAILY.     montelukast 10 MG tablet  Commonly known as:  SINGULAIR  TAKE ONE TABLET AT BEDTIME     NAMENDA XR 28 MG Cp24 24 hr capsule  Generic drug:  memantine  Take 1 capsule by mouth daily.     OXYGEN  Oxygen 3lpm 24/7     ranitidine 150 MG tablet  Commonly known as:  ZANTAC  Take 1 tablet (150 mg total) by mouth at bedtime.     sodium chloride 0.65 % Soln nasal spray  Commonly known as:  OCEAN  2 puffs every 4-6 hours as needed for nasal congestion     valsartan-hydrochlorothiazide 80-12.5 MG tablet  Commonly known as:  DIOVAN-HCT  TAKE 1 TABLET DAILY           Objective:    BP 189/74 mmHg  Pulse 70  Temp(Src) 97.9 F (36.6 C) (Oral)  Ht 5\' 4"  (1.626 m)  Wt 174 lb 6.4 oz (79.107 kg)  BMI 29.92 kg/m2  SpO2 95%  Wt Readings from Last 3 Encounters:  03/29/15 174 lb 6.4 oz (79.107 kg)  03/09/15 173 lb 9.6 oz (78.744 kg)  03/06/15 173 lb (78.472 kg)    Physical Exam  Constitutional: She is oriented to person, place, and time. She appears well-developed and well-nourished. No distress.  Eyes: Conjunctivae and EOM are normal. Pupils are equal, round, and reactive to light.  Neck: Neck supple. No thyromegaly present.  Cardiovascular: Normal rate, regular rhythm, normal heart sounds and intact distal pulses.   No murmur heard. Pulmonary/Chest: Effort normal and breath sounds normal. No respiratory distress. She has no wheezes.  Musculoskeletal: Normal range of motion. She exhibits no edema or tenderness.  Lymphadenopathy:    She has no cervical adenopathy.  Neurological: She is alert and oriented  to person, place, and time. She displays normal reflexes. No cranial nerve deficit. She exhibits normal muscle tone. Coordination normal.  Skin: Skin is warm and dry. No rash noted. She is not diaphoretic.  Psychiatric: She has a normal mood and affect. Her behavior is normal.  Nursing note and vitals reviewed.   Results for orders placed or performed in visit on 11/15/13  POCT glucose (manual entry)  Result Value Ref Range   POC Glucose 169 (A) 70 -  99 mg/dl      Assessment & Plan:   Problem List Items Addressed This Visit      Cardiovascular and Mediastinum   HTN (hypertension) - Primary    bp managed by Dr Selmer Dominion and Auditory   Alzheimer's disease       Follow up plan: Return in about 3 months (around 06/29/2015), or if symptoms worsen or fail to improve, for recheck namenda.  Counseling provided for all of the vaccine components No orders of the defined types were placed in this encounter.    Caryl Pina, MD [facility] 03/29/2015, 4:43 PM

## 2015-03-29 NOTE — Assessment & Plan Note (Signed)
bp managed by Dr Percival Spanish

## 2015-03-30 ENCOUNTER — Telehealth: Payer: Self-pay | Admitting: Family Medicine

## 2015-03-30 NOTE — Telephone Encounter (Signed)
Pt given appt for 04/02/15 with Dr.Dettinger.

## 2015-04-02 ENCOUNTER — Ambulatory Visit: Payer: Self-pay | Admitting: Family Medicine

## 2015-04-04 ENCOUNTER — Encounter: Payer: Self-pay | Admitting: Family Medicine

## 2015-04-05 ENCOUNTER — Ambulatory Visit (INDEPENDENT_AMBULATORY_CARE_PROVIDER_SITE_OTHER): Payer: Medicare Other | Admitting: Family Medicine

## 2015-04-05 ENCOUNTER — Encounter: Payer: Self-pay | Admitting: Family Medicine

## 2015-04-05 VITALS — BP 137/81 | HR 79 | Temp 97.5°F | Ht 64.0 in | Wt 174.8 lb

## 2015-04-05 DIAGNOSIS — G629 Polyneuropathy, unspecified: Secondary | ICD-10-CM

## 2015-04-05 DIAGNOSIS — F0280 Dementia in other diseases classified elsewhere without behavioral disturbance: Secondary | ICD-10-CM | POA: Diagnosis not present

## 2015-04-05 DIAGNOSIS — R739 Hyperglycemia, unspecified: Secondary | ICD-10-CM | POA: Diagnosis not present

## 2015-04-05 DIAGNOSIS — G309 Alzheimer's disease, unspecified: Secondary | ICD-10-CM | POA: Diagnosis not present

## 2015-04-05 NOTE — Assessment & Plan Note (Signed)
Patient would like to try off of medication for a short period, 2 weeks and then will return for testing to see how her memory does.

## 2015-04-05 NOTE — Progress Notes (Signed)
BP 137/81 mmHg  Pulse 79  Temp(Src) 97.5 F (36.4 C) (Oral)  Ht 5' 4" (1.626 m)  Wt 174 lb 12.8 oz (79.289 kg)  BMI 29.99 kg/m2   Subjective:    Patient ID: Katie Bryant, female    DOB: Feb 15, 1928, 82 y.o.   MRN: [id number]  HPI: Katie Bryant is a 82 y.o. female presenting on 04/05/2015 for Pain in both arms, ongoing for several months   HPI Dementia Patient is coming in because of dementia recheck. She feels she doesn't have dementia and was wrong with it on this medication based on a previous evaluation from a psychiatrist. She would like to stop the medication and see what happens. I told her could get pretty significant if we do that. She says she does not have any memory issues and would like to try it. She is going to come off the medication for the next 2 weeks and then return and be rechecked with a Mini-Mental Status and see how it goes.  bilateral nerve pain in hands Patient comes in complaining of bilateral nerve pain in both hands that is been increasing over the past couple months. She describes as a burning and tingling sensation that is worse in the left arm than the right arm. She denies any neck pains or back pain or pain shooting up into her head. It is mostly just in her arms near her hands and forearms. She is overlying skin changes or rashes or drainage.  Relevant past medical, surgical, family and social history reviewed and updated as indicated. Interim medical history since our last visit reviewed. Allergies and medications reviewed and updated.  Review of Systems  Constitutional: Negative for fever and chills.  HENT: Negative for congestion, ear discharge, ear pain, sneezing and sore throat.   Eyes: Negative for redness and visual disturbance.  Respiratory: Negative for chest tightness and shortness of breath.   Cardiovascular: Negative for chest pain and leg swelling.  Genitourinary: Negative for dysuria and difficulty urinating.  Musculoskeletal:  Negative for back pain, arthralgias, gait problem, neck pain and neck stiffness.  Skin: Negative for color change, rash and wound.  Neurological: Positive for numbness (Tingling and burning sensation in hands and forearms). Negative for weakness, light-headedness and headaches.  Psychiatric/Behavioral: Negative for behavioral problems and agitation.  All other systems reviewed and are negative.   Per HPI unless specifically indicated above     Medication List       This list is accurate as of: 04/05/15  9:42 AM.  Always use your most recent med list.               acetaminophen 500 MG tablet  Commonly known as:  TYLENOL  Take 500 mg by mouth every 6 (six) hours as needed.     albuterol 108 (90 Base) MCG/ACT inhaler  Commonly known as:  PROAIR HFA  INHALE TWO PUFFS BY MOUTH EVERY 6 HOURS AS NEEDED     ALPRAZolam 1 MG tablet  Commonly known as:  XANAX  TAKE  (1)  TABLET TWICE A DAY.     aspirin 81 MG tablet  Take 81 mg by mouth daily.     atorvastatin 40 MG tablet  Commonly known as:  LIPITOR  TAKE 1 TABLET ONCE DAILY AT 6PM     bisacodyl 5 MG EC tablet  Commonly known as:  DULCOLAX  Take 5 mg by mouth daily as needed for moderate constipation.     budesonide 0.5  MG/2ML nebulizer solution  Commonly known as:  PULMICORT  USE ONE VIAL 2 TIMES DAILY VIA NEBULIZER     chlorpheniramine 4 MG tablet  Commonly known as:  CHLOR-TRIMETON  Take 8 mg by mouth every 6 (six) hours as needed. Reported on 03/09/2015     dimenhyDRINATE 50 MG tablet  Commonly known as:  DRAMAMINE  Take 50 mg by mouth every 8 (eight) hours as needed. Reported on 03/09/2015     docusate sodium 100 MG capsule  Commonly known as:  COLACE  Take 100 mg by mouth daily as needed for mild constipation. Reported on 03/09/2015     fenofibrate micronized 134 MG capsule  Commonly known as:  LOFIBRA  TAKE ONE CAPSULE BY MOUTH ONCE DAILY BEFORE BREAKFAST     fluticasone 50 MCG/ACT nasal spray  Commonly known  as:  FLONASE  Place 2 sprays into both nostrils daily.     FLUTTER Devi  As needed     guaifenesin 100 MG/5ML syrup  Commonly known as:  ROBITUSSIN  Take 200 mg by mouth 3 (three) times daily as needed for cough.     ipratropium-albuterol 0.5-2.5 (3) MG/3ML Soln  Commonly known as:  DUONEB  USE ONE VIAL 4 TIMES DAILY VIA NEBULIZER     ketotifen 0.025 % ophthalmic solution  Commonly known as:  ZADITOR  Place 1 drop into both eyes as needed.     metoprolol tartrate 25 MG tablet  Commonly known as:  LOPRESSOR  TAKE (1/2) TABLET TWICE DAILY.     montelukast 10 MG tablet  Commonly known as:  SINGULAIR  TAKE ONE TABLET AT BEDTIME     NAMENDA XR 28 MG Cp24 24 hr capsule  Generic drug:  memantine  Take 1 capsule by mouth daily.     OXYGEN  Oxygen 3lpm 24/7     ranitidine 150 MG tablet  Commonly known as:  ZANTAC  Take 1 tablet (150 mg total) by mouth at bedtime.     sodium chloride 0.65 % Soln nasal spray  Commonly known as:  OCEAN  2 puffs every 4-6 hours as needed for nasal congestion     valsartan-hydrochlorothiazide 80-12.5 MG tablet  Commonly known as:  DIOVAN-HCT  TAKE 1 TABLET DAILY           Objective:    BP 137/81 mmHg  Pulse 79  Temp(Src) 97.5 F (36.4 C) (Oral)  Ht 5' 4" (1.626 m)  Wt 174 lb 12.8 oz (79.289 kg)  BMI 29.99 kg/m2  Wt Readings from Last 3 Encounters:  04/05/15 174 lb 12.8 oz (79.289 kg)  03/29/15 174 lb 6.4 oz (79.107 kg)  03/09/15 173 lb 9.6 oz (78.744 kg)    Physical Exam  Constitutional: She is oriented to person, place, and time. She appears well-developed and well-nourished. No distress.  Eyes: Conjunctivae and EOM are normal. Pupils are equal, round, and reactive to light.  Neck: Neck supple. No thyromegaly present.  Cardiovascular: Normal rate, regular rhythm, normal heart sounds and intact distal pulses.   No murmur heard. Pulmonary/Chest: Effort normal and breath sounds normal. No respiratory distress. She has no wheezes.    Musculoskeletal: Normal range of motion. She exhibits no edema or tenderness.  Lymphadenopathy:    She has no cervical adenopathy.  Neurological: She is alert and oriented to person, place, and time. She has normal strength and normal reflexes. No cranial nerve deficit or sensory deficit (No sensory deficit noticeable on exam). Coordination normal.  Skin: Skin is  warm and dry. No rash noted. She is not diaphoretic. No erythema. No pallor.  Psychiatric: She has a normal mood and affect. Her speech is normal and behavior is normal. Thought content normal. Her mood appears not anxious. Cognition and memory are normal. Cognition and memory are not impaired (Not impaired today but is currently on Namenda.). She does not exhibit a depressed mood. She expresses no suicidal ideation. She expresses no suicidal plans.  Nursing note and vitals reviewed.   Results for orders placed or performed in visit on 11/15/13  POCT glucose (manual entry)  Result Value Ref Range   POC Glucose 169 (A) 70 - 99 mg/dl      Assessment & Plan:   Problem List Items Addressed This Visit      Nervous and Auditory   Alzheimer's disease    Patient would like to try off of medication for a short period, 2 weeks and then will return for testing to see how her memory does.        Other   Morbid obesity ([place])   Relevant Orders   CMP14+EGFR   Lipid panel   TSH    Other Visit Diagnoses    Neuropathy ([place])    -  Primary    Neuropathy in hands, will test labs    Relevant Orders    CMP14+EGFR    Vitamin B12    Folate    TSH    VITAMIN D 25 Hydroxy (Vit-D Deficiency, Fractures)    CBC with Differential/Platelet        Follow up plan: Return in about 2 weeks (around 04/19/2015), or if symptoms worsen or fail to improve, for Follow-up dementia.  Counseling provided for all of the vaccine components Orders Placed This Encounter  Procedures  . CMP14+EGFR  . Lipid panel  . Vitamin B12  . Folate  . TSH  .  VITAMIN D 25 Hydroxy (Vit-D Deficiency, Fractures)  . CBC with Differential/Platelet    Caryl Pina, MD [facility] 04/05/2015, 9:42 AM

## 2015-04-06 LAB — CBC WITH DIFFERENTIAL/PLATELET
BASOS: 1 %
Basophils Absolute: 0 10*3/uL (ref 0.0–0.2)
EOS (ABSOLUTE): 0.2 10*3/uL (ref 0.0–0.4)
EOS: 2 %
HEMATOCRIT: 37.8 % (ref 34.0–46.6)
Hemoglobin: 12.5 g/dL (ref 11.1–15.9)
Immature Grans (Abs): 0 10*3/uL (ref 0.0–0.1)
Immature Granulocytes: 0 %
LYMPHS ABS: 2.1 10*3/uL (ref 0.7–3.1)
Lymphs: 26 %
MCH: 29.3 pg (ref 26.6–33.0)
MCHC: 33.1 g/dL (ref 31.5–35.7)
MCV: 89 fL (ref 79–97)
MONOS ABS: 0.5 10*3/uL (ref 0.1–0.9)
Monocytes: 7 %
Neutrophils Absolute: 5.2 10*3/uL (ref 1.4–7.0)
Neutrophils: 64 %
Platelets: 216 10*3/uL (ref 150–379)
RBC: 4.27 x10E6/uL (ref 3.77–5.28)
RDW: 12.9 % (ref 12.3–15.4)
WBC: 8 10*3/uL (ref 3.4–10.8)

## 2015-04-06 LAB — LIPID PANEL
Chol/HDL Ratio: 2.3 ratio units (ref 0.0–4.4)
Cholesterol, Total: 173 mg/dL (ref 100–199)
HDL: 75 mg/dL (ref >39)
LDL CALC: 76 mg/dL (ref 0–99)
Triglycerides: 109 mg/dL (ref 0–149)
VLDL CHOLESTEROL CAL: 22 mg/dL (ref 5–40)

## 2015-04-06 LAB — CMP14+EGFR
ALBUMIN: 4.4 g/dL (ref 3.5–4.7)
ALT: 16 IU/L (ref 0–32)
AST: 21 IU/L (ref 0–40)
Albumin/Globulin Ratio: 1.8 (ref 1.2–2.2)
Alkaline Phosphatase: 55 IU/L (ref 39–117)
BILIRUBIN TOTAL: 0.3 mg/dL (ref 0.0–1.2)
BUN / CREAT RATIO: 19 (ref 11–26)
BUN: 14 mg/dL (ref 8–27)
CHLORIDE: 98 mmol/L (ref 96–106)
CO2: 28 mmol/L (ref 18–29)
CREATININE: 0.72 mg/dL (ref 0.57–1.00)
Calcium: 9.5 mg/dL (ref 8.7–10.3)
GFR calc non Af Amer: 77 mL/min/{1.73_m2} (ref >59)
GFR, EST AFRICAN AMERICAN: 89 mL/min/{1.73_m2} (ref >59)
GLOBULIN, TOTAL: 2.5 g/dL (ref 1.5–4.5)
GLUCOSE: 102 mg/dL — AB (ref 65–99)
Potassium: 4.1 mmol/L (ref 3.5–5.2)
SODIUM: 144 mmol/L (ref 134–144)
TOTAL PROTEIN: 6.9 g/dL (ref 6.0–8.5)

## 2015-04-06 LAB — TSH: TSH: 5.02 u[IU]/mL — ABNORMAL HIGH (ref 0.450–4.500)

## 2015-04-06 LAB — FOLATE: FOLATE: 12.9 ng/mL (ref >3.0)

## 2015-04-06 LAB — VITAMIN D 25 HYDROXY (VIT D DEFICIENCY, FRACTURES): Vit D, 25-Hydroxy: 15.5 ng/mL — ABNORMAL LOW (ref 30.0–100.0)

## 2015-04-06 LAB — VITAMIN B12: VITAMIN B 12: 312 pg/mL (ref 211–946)

## 2015-04-09 ENCOUNTER — Other Ambulatory Visit: Payer: Self-pay | Admitting: Family Medicine

## 2015-04-09 ENCOUNTER — Telehealth: Payer: Self-pay | Admitting: Family Medicine

## 2015-04-09 DIAGNOSIS — R7301 Impaired fasting glucose: Secondary | ICD-10-CM

## 2015-04-09 MED ORDER — LEVOTHYROXINE SODIUM 50 MCG PO TABS: 50.0000 ug | ORAL_TABLET | Freq: Every day | ORAL | Status: DC

## 2015-04-09 NOTE — Telephone Encounter (Signed)
Patient aware of results and verbalizes understanding. A1c added.

## 2015-04-09 NOTE — Telephone Encounter (Signed)
Patient had low thyroid levels. Sent in levothyroxine 50 g for her, take at least 30 minutes prior to breakfast every day. Have her recheck her thyroid levels in 2 months. Patient also has low vitamin D levels, have her take 5000-6000 international units of vitamin D daily. Patient also had elevated fasting glucose and I added on a hemoglobin A1c.

## 2015-04-10 NOTE — Telephone Encounter (Signed)
Last seen 04/05/15  Dr Dettinger   If approved route to nurse to call into [facility]

## 2015-04-10 NOTE — Telephone Encounter (Signed)
Will refill 1 month of xanax but if she is going to have me as her provider then we need to switch her over and discuss this medication. Refilled statin

## 2015-04-11 LAB — HGB A1C W/O EAG: HEMOGLOBIN A1C: 5.8 % — AB (ref 4.8–5.6)

## 2015-04-11 LAB — SPECIMEN STATUS REPORT

## 2015-04-19 ENCOUNTER — Ambulatory Visit: Payer: Self-pay | Admitting: Family Medicine

## 2015-04-20 DIAGNOSIS — J449 Chronic obstructive pulmonary disease, unspecified: Secondary | ICD-10-CM | POA: Diagnosis not present

## 2015-04-22 DIAGNOSIS — J449 Chronic obstructive pulmonary disease, unspecified: Secondary | ICD-10-CM | POA: Diagnosis not present

## 2015-04-23 ENCOUNTER — Other Ambulatory Visit: Payer: Self-pay

## 2015-04-23 MED ORDER — VITAMIN D (ERGOCALCIFEROL) 1.25 MG (50000 UNIT) PO CAPS: 50000.0000 [IU] | ORAL_CAPSULE | ORAL | Status: DC

## 2015-04-23 NOTE — Telephone Encounter (Signed)
Last seen Dr Warrick Parisian 04/05/15  Vit D level 15.5  This med is not on EPIC list

## 2015-04-25 ENCOUNTER — Ambulatory Visit (INDEPENDENT_AMBULATORY_CARE_PROVIDER_SITE_OTHER): Payer: Medicare Other | Admitting: Family Medicine

## 2015-04-25 VITALS — BP 138/74 | HR 81 | Temp 98.2°F | Ht 64.0 in | Wt 172.6 lb

## 2015-04-25 DIAGNOSIS — F339 Major depressive disorder, recurrent, unspecified: Secondary | ICD-10-CM | POA: Insufficient documentation

## 2015-04-25 DIAGNOSIS — G309 Alzheimer's disease, unspecified: Secondary | ICD-10-CM

## 2015-04-25 DIAGNOSIS — F419 Anxiety disorder, unspecified: Secondary | ICD-10-CM

## 2015-04-25 DIAGNOSIS — F0280 Dementia in other diseases classified elsewhere without behavioral disturbance: Secondary | ICD-10-CM

## 2015-04-25 DIAGNOSIS — I10 Essential (primary) hypertension: Secondary | ICD-10-CM | POA: Diagnosis not present

## 2015-04-25 DIAGNOSIS — F329 Major depressive disorder, single episode, unspecified: Secondary | ICD-10-CM

## 2015-04-25 DIAGNOSIS — F418 Other specified anxiety disorders: Secondary | ICD-10-CM

## 2015-04-25 NOTE — Progress Notes (Signed)
BP 181/80 mmHg  Pulse 80  Temp(Src) 98.2 F (36.8 C) (Oral)  Ht 5' 4"  (1.626 m)  Wt 172 lb 9.6 oz (78.291 kg)  BMI 29.61 kg/m2   Subjective:    Patient ID: Katie Bryant, female    DOB: 1928/04/04, 82 y.o.   MRN: [id number]  HPI: Katie Bryant is a 82 y.o. female presenting on 04/25/2015 for Dementia recheck   HPI Dementia and mood recheck Patient is coming in today for a recheck on her dementia and anxiety depression. She is currently on Namenda and she did not stop her to continue to take it. She is also on Wellbutrin for her depression and feels like it is doing well for her. She denies any thoughts of suicide or thoughts of hurting herself. Her daughter is here today with her verify is that she often does forget things even midsentence and often forgets what they were talking about even 5 minutes ago. She gets confused about daily household chores and activities.  Relevant past medical, surgical, family and social history reviewed and updated as indicated. Interim medical history since our last visit reviewed. Allergies and medications reviewed and updated.  Review of Systems  Constitutional: Negative for fever and chills.  HENT: Negative for congestion, ear discharge and ear pain.   Eyes: Negative for redness and visual disturbance.  Respiratory: Negative for chest tightness and shortness of breath.   Cardiovascular: Negative for chest pain and leg swelling.  Genitourinary: Negative for dysuria and difficulty urinating.  Musculoskeletal: Negative for back pain and gait problem.  Skin: Negative for rash.  Neurological: Negative for light-headedness and headaches.  Psychiatric/Behavioral: Positive for confusion, dysphoric mood, decreased concentration and agitation. Negative for suicidal ideas, behavioral problems, sleep disturbance and self-injury. The patient is nervous/anxious.   All other systems reviewed and are negative.   Per HPI unless specifically indicated  above     Medication List       This list is accurate as of: 04/25/15  3:42 PM.  Always use your most recent med list.               acetaminophen 500 MG tablet  Commonly known as:  TYLENOL  Take 500 mg by mouth every 6 (six) hours as needed.     albuterol 108 (90 Base) MCG/ACT inhaler  Commonly known as:  PROAIR HFA  INHALE TWO PUFFS BY MOUTH EVERY 6 HOURS AS NEEDED     ALPRAZolam 1 MG tablet  Commonly known as:  XANAX  TAKE  (1)  TABLET TWICE A DAY.     aspirin 81 MG tablet  Take 81 mg by mouth daily.     atorvastatin 40 MG tablet  Commonly known as:  LIPITOR  TAKE 1 TABLET ONCE DAILY AT 6PM     bisacodyl 5 MG EC tablet  Commonly known as:  DULCOLAX  Take 5 mg by mouth daily as needed for moderate constipation.     budesonide 0.5 MG/2ML nebulizer solution  Commonly known as:  PULMICORT  USE ONE VIAL 2 TIMES DAILY VIA NEBULIZER     buPROPion 300 MG 24 hr tablet  Commonly known as:  WELLBUTRIN XL  Take 300 mg by mouth daily.     chlorpheniramine 4 MG tablet  Commonly known as:  CHLOR-TRIMETON  Take 8 mg by mouth every 6 (six) hours as needed. Reported on 03/09/2015     dimenhyDRINATE 50 MG tablet  Commonly known as:  DRAMAMINE  Take 50 mg  by mouth every 8 (eight) hours as needed. Reported on 03/09/2015     docusate sodium 100 MG capsule  Commonly known as:  COLACE  Take 100 mg by mouth daily as needed for mild constipation. Reported on 03/09/2015     fenofibrate micronized 134 MG capsule  Commonly known as:  LOFIBRA  TAKE ONE CAPSULE BY MOUTH ONCE DAILY BEFORE BREAKFAST     fluticasone 50 MCG/ACT nasal spray  Commonly known as:  FLONASE  Place 2 sprays into both nostrils daily.     FLUTTER Devi  As needed     guaifenesin 100 MG/5ML syrup  Commonly known as:  ROBITUSSIN  Take 200 mg by mouth 3 (three) times daily as needed for cough.     ipratropium-albuterol 0.5-2.5 (3) MG/3ML Soln  Commonly known as:  DUONEB  USE ONE VIAL 4 TIMES DAILY VIA NEBULIZER       ketotifen 0.025 % ophthalmic solution  Commonly known as:  ZADITOR  Place 1 drop into both eyes as needed.     levothyroxine 50 MCG tablet  Commonly known as:  SYNTHROID, LEVOTHROID  Take 1 tablet (50 mcg total) by mouth daily.     metoprolol tartrate 25 MG tablet  Commonly known as:  LOPRESSOR  TAKE (1/2) TABLET TWICE DAILY.     montelukast 10 MG tablet  Commonly known as:  SINGULAIR  TAKE ONE TABLET AT BEDTIME     NAMENDA XR 28 MG Cp24 24 hr capsule  Generic drug:  memantine  Take 1 capsule by mouth daily.     OXYGEN  Oxygen 3lpm 24/7     ranitidine 150 MG tablet  Commonly known as:  ZANTAC  Take 1 tablet (150 mg total) by mouth at bedtime.     sodium chloride 0.65 % Soln nasal spray  Commonly known as:  OCEAN  2 puffs every 4-6 hours as needed for nasal congestion     valsartan-hydrochlorothiazide 80-12.5 MG tablet  Commonly known as:  DIOVAN-HCT  TAKE 1 TABLET DAILY     Vitamin D (Ergocalciferol) 50000 units Caps capsule  Commonly known as:  DRISDOL  Take 1 capsule (50,000 Units total) by mouth every 7 (seven) days.           Objective:    BP 181/80 mmHg  Pulse 80  Temp(Src) 98.2 F (36.8 C) (Oral)  Ht 5' 4"  (1.626 m)  Wt 172 lb 9.6 oz (78.291 kg)  BMI 29.61 kg/m2  Wt Readings from Last 3 Encounters:  04/25/15 172 lb 9.6 oz (78.291 kg)  04/05/15 174 lb 12.8 oz (79.289 kg)  03/29/15 174 lb 6.4 oz (79.107 kg)    Physical Exam  Constitutional: She is oriented to person, place, and time. She appears well-developed and well-nourished. No distress.  Eyes: Conjunctivae and EOM are normal. Pupils are equal, round, and reactive to light.  Cardiovascular: Normal rate, regular rhythm, normal heart sounds and intact distal pulses.   No murmur heard. Pulmonary/Chest: Effort normal and breath sounds normal. No respiratory distress. She has no wheezes.  Musculoskeletal: Normal range of motion. She exhibits no edema or tenderness.  Neurological: She is alert  and oriented to person, place, and time. Coordination normal.  Skin: Skin is warm and dry. No rash noted. She is not diaphoretic.  Psychiatric: Judgment and thought content normal. Her mood appears anxious. She is agitated. She is not aggressive, not hyperactive, not actively hallucinating and not combative. Thought content is not paranoid and not delusional. She exhibits a  depressed mood. She expresses no suicidal ideation. She expresses no suicidal plans. She exhibits abnormal recent memory (At times she has short-term memory loss). She is attentive.  Nursing note and vitals reviewed.   Results for orders placed or performed in visit on 04/05/15  CMP14+EGFR  Result Value Ref Range   Glucose 102 (H) 65 - 99 mg/dL   BUN 14 8 - 27 mg/dL   Creatinine, Ser 0.72 0.57 - 1.00 mg/dL   GFR calc non Af Amer 77 >59 mL/min/1.73   GFR calc Af Amer 89 >59 mL/min/1.73   BUN/Creatinine Ratio 19 11 - 26   Sodium 144 134 - 144 mmol/L   Potassium 4.1 3.5 - 5.2 mmol/L   Chloride 98 96 - 106 mmol/L   CO2 28 18 - 29 mmol/L   Calcium 9.5 8.7 - 10.3 mg/dL   Total Protein 6.9 6.0 - 8.5 g/dL   Albumin 4.4 3.5 - 4.7 g/dL   Globulin, Total 2.5 1.5 - 4.5 g/dL   Albumin/Globulin Ratio 1.8 1.2 - 2.2   Bilirubin Total 0.3 0.0 - 1.2 mg/dL   Alkaline Phosphatase 55 39 - 117 IU/L   AST 21 0 - 40 IU/L   ALT 16 0 - 32 IU/L  Lipid panel  Result Value Ref Range   Cholesterol, Total 173 100 - 199 mg/dL   Triglycerides 109 0 - 149 mg/dL   HDL 75 >39 mg/dL   VLDL Cholesterol Cal 22 5 - 40 mg/dL   LDL Calculated 76 0 - 99 mg/dL   Chol/HDL Ratio 2.3 0.0 - 4.4 ratio units  Vitamin B12  Result Value Ref Range   Vitamin B-12 312 211 - 946 pg/mL  Folate  Result Value Ref Range   Folate 12.9 >3.0 ng/mL  TSH  Result Value Ref Range   TSH 5.020 (H) 0.450 - 4.500 uIU/mL  VITAMIN D 25 Hydroxy (Vit-D Deficiency, Fractures)  Result Value Ref Range   Vit D, 25-Hydroxy 15.5 (L) 30.0 - 100.0 ng/mL  CBC with  Differential/Platelet  Result Value Ref Range   WBC 8.0 3.4 - 10.8 x10E3/uL   RBC 4.27 3.77 - 5.28 x10E6/uL   Hemoglobin 12.5 11.1 - 15.9 g/dL   Hematocrit 37.8 34.0 - 46.6 %   MCV 89 79 - 97 fL   MCH 29.3 26.6 - 33.0 pg   MCHC 33.1 31.5 - 35.7 g/dL   RDW 12.9 12.3 - 15.4 %   Platelets 216 150 - 379 x10E3/uL   Neutrophils 64 %   Lymphs 26 %   Monocytes 7 %   Eos 2 %   Basos 1 %   Neutrophils Absolute 5.2 1.4 - 7.0 x10E3/uL   Lymphocytes Absolute 2.1 0.7 - 3.1 x10E3/uL   Monocytes Absolute 0.5 0.1 - 0.9 x10E3/uL   EOS (ABSOLUTE) 0.2 0.0 - 0.4 x10E3/uL   Basophils Absolute 0.0 0.0 - 0.2 x10E3/uL   Immature Granulocytes 0 %   Immature Grans (Abs) 0.0 0.0 - 0.1 x10E3/uL  Hgb A1c w/o eAG  Result Value Ref Range   Hgb A1c MFr Bld 5.8 (H) 4.8 - 5.6 %  Specimen status report  Result Value Ref Range   specimen status report Comment     Mini-Mental Status exam: 24/30, currently on Namenda    Assessment & Plan:   Problem List Items Addressed This Visit      Cardiovascular and Mediastinum   HTN (hypertension)     Nervous and Auditory   Alzheimer's disease - Primary  Relevant Medications   buPROPion (WELLBUTRIN XL) 300 MG 24 hr tablet     Other   Anxiety and depression   Relevant Medications   buPROPion (WELLBUTRIN XL) 300 MG 24 hr tablet       Follow up plan: Return in about 4 weeks (around 05/23/2015), or if symptoms worsen or fail to improve, for Recheck blood pressure.  Counseling provided for all of the vaccine components No orders of the defined types were placed in this encounter.    Caryl Pina, MD [facility] 04/25/2015, 3:42 PM

## 2015-04-25 NOTE — Patient Instructions (Signed)
Monitor blood pressure for now, we'll have her come back in one month and see where it is.

## 2015-05-17 ENCOUNTER — Other Ambulatory Visit: Payer: Self-pay | Admitting: Family Medicine

## 2015-05-17 ENCOUNTER — Telehealth: Payer: Self-pay | Admitting: Family Medicine

## 2015-05-17 NOTE — Telephone Encounter (Signed)
Left message for patient's sister to contact our office

## 2015-05-18 NOTE — Telephone Encounter (Signed)
Patient last seen in office on 04-25-15. Rx last filled on 4-17 for #60. Please advise. If approved please route to pool A so nurse can phone in to pharmacy

## 2015-05-20 DIAGNOSIS — J449 Chronic obstructive pulmonary disease, unspecified: Secondary | ICD-10-CM | POA: Diagnosis not present

## 2015-05-21 ENCOUNTER — Telehealth: Payer: Self-pay

## 2015-05-21 ENCOUNTER — Telehealth: Payer: Self-pay | Admitting: Family Medicine

## 2015-05-21 NOTE — Telephone Encounter (Signed)
rx called in by Barnett Applebaum

## 2015-05-21 NOTE — Telephone Encounter (Signed)
done

## 2015-05-21 NOTE — Telephone Encounter (Signed)
I need Dr Dettinger to call me

## 2015-05-22 DIAGNOSIS — J449 Chronic obstructive pulmonary disease, unspecified: Secondary | ICD-10-CM | POA: Diagnosis not present

## 2015-05-30 ENCOUNTER — Encounter: Payer: Self-pay | Admitting: Cardiology

## 2015-05-30 ENCOUNTER — Ambulatory Visit (INDEPENDENT_AMBULATORY_CARE_PROVIDER_SITE_OTHER): Payer: Medicare Other | Admitting: Cardiology

## 2015-05-30 VITALS — BP 132/60 | HR 92 | Ht 65.0 in | Wt 175.0 lb

## 2015-05-30 DIAGNOSIS — I10 Essential (primary) hypertension: Secondary | ICD-10-CM

## 2015-05-30 DIAGNOSIS — I6523 Occlusion and stenosis of bilateral carotid arteries: Secondary | ICD-10-CM | POA: Diagnosis not present

## 2015-05-30 NOTE — Progress Notes (Signed)
HPI The patient presents for followup of aortic stenosis. She is mostly limited by severe COPD. She has chronic dyspnea and is O2 dependent. She is now on 4 liters at times.   She's had no specific cardiovascular complaints and is not having any chest pressure, neck or arm discomfort. She's not had any palpitations, syncope or syncope. She's had no weight gain.  Her husband of 9 years died last year.    No Known Allergies  Current Outpatient Prescriptions  Medication Sig Dispense Refill  . acetaminophen (TYLENOL) 500 MG tablet Take 500 mg by mouth every 6 (six) hours as needed.    Marland Kitchen albuterol (PROAIR HFA) 108 (90 Base) MCG/ACT inhaler INHALE TWO PUFFS BY MOUTH EVERY 6 HOURS AS NEEDED 1 Inhaler 5  . ALPRAZolam (XANAX) 1 MG tablet TAKE  (1)  TABLET TWICE A DAY. 60 tablet 1  . aspirin 81 MG tablet Take 81 mg by mouth daily.    Marland Kitchen atorvastatin (LIPITOR) 40 MG tablet TAKE 1 TABLET ONCE DAILY AT 6PM 30 tablet 5  . bisacodyl (DULCOLAX) 5 MG EC tablet Take 5 mg by mouth daily as needed for moderate constipation.    . budesonide (PULMICORT) 0.5 MG/2ML nebulizer solution USE ONE VIAL 2 TIMES DAILY VIA NEBULIZER 120 mL 11  . buPROPion (WELLBUTRIN XL) 300 MG 24 hr tablet Take 300 mg by mouth daily.    . chlorpheniramine (CHLOR-TRIMETON) 4 MG tablet Take 8 mg by mouth every 6 (six) hours as needed. Reported on 03/09/2015    . dimenhyDRINATE (DRAMAMINE) 50 MG tablet Take 50 mg by mouth every 8 (eight) hours as needed. Reported on 03/09/2015    . fenofibrate micronized (LOFIBRA) 134 MG capsule TAKE ONE CAPSULE BY MOUTH ONCE DAILY BEFORE BREAKFAST 90 capsule 3  . fluticasone (FLONASE) 50 MCG/ACT nasal spray Place 2 sprays into both nostrils daily.    Marland Kitchen guaifenesin (ROBITUSSIN) 100 MG/5ML syrup Take 200 mg by mouth 3 (three) times daily as needed for cough.    Marland Kitchen ipratropium-albuterol (DUONEB) 0.5-2.5 (3) MG/3ML SOLN USE ONE VIAL 4 TIMES DAILY VIA NEBULIZER 120 mL 9  . ketotifen (ZADITOR) 0.025 % ophthalmic  solution Place 1 drop into both eyes as needed.    Marland Kitchen levothyroxine (SYNTHROID, LEVOTHROID) 50 MCG tablet Take 1 tablet (50 mcg total) by mouth daily. 30 tablet 2  . metoprolol tartrate (LOPRESSOR) 25 MG tablet TAKE (1/2) TABLET TWICE DAILY. 30 tablet 4  . montelukast (SINGULAIR) 10 MG tablet TAKE ONE TABLET AT BEDTIME 30 tablet 5  . NAMENDA XR 28 MG CP24 Take 1 capsule by mouth daily.     . ranitidine (ZANTAC) 150 MG tablet Take 1 tablet (150 mg total) by mouth at bedtime. 30 tablet 11  . sodium chloride (OCEAN) 0.65 % SOLN nasal spray 2 puffs every 4-6 hours as needed for nasal congestion    . valsartan-hydrochlorothiazide (DIOVAN-HCT) 80-12.5 MG tablet TAKE 1 TABLET DAILY 30 tablet 5  . Vitamin D, Ergocalciferol, (DRISDOL) 50000 units CAPS capsule Take 1 capsule (50,000 Units total) by mouth every 7 (seven) days. 12 capsule 0  . OXYGEN Oxygen 3lpm 24/7    . Respiratory Therapy Supplies (FLUTTER) DEVI As needed     No current facility-administered medications for this visit.    Past Medical History  Diagnosis Date  . COPD (chronic obstructive pulmonary disease) ([place])   . Oxygen dependent   . Aortic stenosis, mild   . Obesity   . Hypertension   . Anxiety   .  Back pain   . Breast cancer ([place])   . Asthma   . Sleep apnea   . DEMENTIA   . GERD (gastroesophageal reflux disease)   . Arthritis   . Pneumonia   . H/O hiatal hernia   . Depression   . Lymphadenopathy 10/07/2012  . Right hamstring muscle strain 04/26/2013  . Alzheimer's disease 03/29/2015    Past Surgical History  Procedure Laterality Date  . Cardiac catheterization      EF 55-60%  . Tonsillectomy and adenoidectomy  1942  . Breast reconstruction    . Appendectomy    . Mastectomy  1980    ROS:  Fatigue, diarrhea. Otherwise as stated in the HPI and negative for all other systems.  PHYSICAL EXAM BP 132/60 mmHg  Pulse 92  Ht 5\' 5"  (1.651 m)  Wt 175 lb (79.379 kg)  BMI 29.12 kg/m2 GENERAL:  Chronically ill  appearing NECK:  No jugular venous distention, waveform within normal limits, carotid upstroke brisk and symmetric, no bruits, no thyromegaly LUNGS:  Decreased breath sounds with no wheezing or crackles HEART:  PMI not displaced or sustained,S1 and S2 within normal limits, no S3, no S4, no clicks, no rubs, 2/6 systolic murmur her best at the left upper sternal border,  Very diminished heart sounds ABD:  Flat, positive bowel sounds normal in frequency in pitch, no bruits, no rebound, no guarding, no midline pulsatile mass, no hepatomegaly, no splenomegaly EXT:  2 plus pulses throughout, no edema, no cyanosis no clubbing  EKG:  Sinus rhythm, rate 95, left axis deviation, left anterior fascicular block, interventricular conduction delay, no acute ST-T wave changes. 05/30/2015  ASSESSMENT AND PLAN  Aortic stenosis - She had very mild AS in the past.  No further workup is suggested. No change in therapy is indicated.    HTN (hypertension) -  Her blood pressure is at target.  No change in therapy is indicated.   Carotid stenosis - She is due for carotid.  I will arrange this.

## 2015-05-30 NOTE — Patient Instructions (Addendum)
Medication Instructions:  The current medical regimen is effective;  continue present plan and medications.  Your physician has requested that you have a carotid duplex. This test is an ultrasound of the carotid arteries in your neck. It looks at blood flow through these arteries that supply the brain with blood. Allow one hour for this exam. There are no restrictions or special instructions.  Follow-Up: Follow up in 1 year with Dr. Percival Spanish.  You will receive a letter in the mail 2 months before you are due.  Please call us when you receive this letter to schedule your follow up appointment.  If you need a refill on your cardiac medications before your next appointment, please call your pharmacy.  Thank you for choosing [facility]!!

## 2015-05-31 ENCOUNTER — Other Ambulatory Visit: Payer: Self-pay | Admitting: Cardiology

## 2015-06-01 NOTE — Telephone Encounter (Signed)
REFILL 

## 2015-06-05 ENCOUNTER — Ambulatory Visit (INDEPENDENT_AMBULATORY_CARE_PROVIDER_SITE_OTHER): Payer: Medicare Other | Admitting: Adult Health

## 2015-06-05 ENCOUNTER — Encounter: Payer: Self-pay | Admitting: Adult Health

## 2015-06-05 VITALS — BP 124/78 | HR 76 | Temp 98.2°F | Ht 65.0 in | Wt 175.0 lb

## 2015-06-05 DIAGNOSIS — J9612 Chronic respiratory failure with hypercapnia: Secondary | ICD-10-CM

## 2015-06-05 DIAGNOSIS — J449 Chronic obstructive pulmonary disease, unspecified: Secondary | ICD-10-CM | POA: Diagnosis not present

## 2015-06-05 NOTE — Progress Notes (Signed)
Subjective:     Patient ID: Katie Bryant, female   DOB: February 09, 1928    MRN: [id number]   Brief patient profile:  10 yowf quit smoking 1992 with GOLD III copd dx 2009 eval in pulm clinic p hospitalized at [facility] 12/21/10     Admit date: 12/21/2010  Discharge date: 12/24/2010  C O P D  ALLERGIC RHINITIS  DYSPNEA  G E R D  Aortic stenosis  Insomnia  Leukocytosis    History of Present Illness  09/27/2013 Follow up and Med review  Hx of COPD III/ 02 dep/ has med  Patient returns for followup and medication review. We reviewed all her medications and organized them into a medication calendar with patient education. Does get easily confused with meds. Appears family is helping with meds now.  Recently dx with dementia, now on Namenda. Seems to be some improvement with memory.  rec No change rx/ follow med calendar and bring to each visit/ daughter Katie Bryant     12/08/2013  F/u   office visit/ Katie Bryant  / no med calendar/ no daughter  Chief Complaint  Patient presents with  . Follow-up    Pt states that overall her breathing is doing well.  She denies ane new co's today.   Katie Bryant is putting out all the medications for a week each Sunday including neb solutions  Has not needed ventolin   At all, nor any of her prns though she again struggled with the action plan portion of the calendar  rec No change rx    02/24/2014 f/u ov/Wert re: GOLD III / 02 dep  Chief Complaint  Patient presents with  . Follow-up    Pt states breathing is unchanged since last visit. She just gets anxious at times, her spouse has been sick. She does get sob with exhertion.  tamera with pt/ not clear she's increasing 02 to 4lpm with activity as rec nor able to use the action plans at the bottom of the med calendar Able to walk up to a hundred feet flat ok on 3lpm  rec Follow med calendar    06/09/2014 f/u ov/Wert re: GOLD III copd/ 02 dep/ no med calendar (brought by daughter who is POA and is not her pill  Environmental education officer) Chief Complaint  Patient presents with  . Follow-up    Pt states that her breathing is unchanged. No new co's today.   02 not clear what it's set on at home but pt thinks it's 3lpm but takes it off freqently  No change doe x more than slow adls rec Late instructions per med calendar :  3lpm 24/7 but 4lpm with exertion    09/07/2014 f/u ov/Wert re: copd gold III pfts/ chronic resp failure  Chief Complaint  Patient presents with  . Follow-up    Pt c/o SOB early mornings and with activity. Denies any chest tightness, wheezing, and cough.   has titrated oxygen at 2 L/m 24/7 but 4 L with activity Sems  to be doing better on maintenance therapy but still having trouble understanding the action plan Works. Her family is taking an active role in her medication administration which seems to be helping more than anything else. Doe = MMRC2/3  = can't walk a nl pace on a flat grade s sob even on 02  rec Follow med calendar    03/06/2015  f/u ov/Wert re: copd gold III / 02 dep 3lpm x 4 with activity   Chief Complaint  Patient presents with  .  Follow-up    Breathing overall doing well. She is using albuterol inhaler almost every day.   doe on 02 = MMRC2 = can't walk a nl pace on a flat grade s sob - does ok at slow pace, flat grade at 4lpm   >>no changes    06/05/2015 Follow up : COPD  Pt returns for 3 month follow up .  Says overall she is doing well. Here with her daughter We reviewed all her meds and organized them into a med calendar with pt education.  She gets meds done at [facility] . They package her pills up in divided sections with 4 time frames and give her weekly packs. Family calls  Pharmacy if there is changes and pharmacy verifies changes with MD  and adjusts packages. Meds are delivered , they pay $15 month for this service.  PVX and Prevnar are utd.  No longer getting nebs thru [facility] , now thru pharmacy bc [place] contract with her insurance. Not sure if filed on  Medicare B at pharmacy.  She denies chest pain, orthopnea, edema or hemoptysis   Current Medications, Allergies, Complete Past Medical History, Past Surgical History, Family History, and Social History were reviewed in [employer] record.  ROS  The following are not active complaints unless bolded sore throat, dysphagia, dental problems, itching, sneezing,  nasal congestion or excess/ purulent secretions, ear ache,   fever, chills, sweats, unintended wt loss, pleuritic or exertional cp, hemoptysis,  orthopnea pnd or leg swelling, presyncope, palpitations, heartburn, abdominal pain, anorexia, nausea, vomiting, diarrhea  or change in bowel or urinary habits, change in stools or urine, dysuria,hematuria,  rash, arthralgias, visual complaints, headache, numbness weakness or ataxia or problems with walking or coordination,  change in mood/affect or memory.                   Objective:  Physical Exam  Stoic amb wf nad on 02/ vital signs reviewed  Filed Vitals:   06/05/15 1031  BP: 124/78  Pulse: 76  Temp: 98.2 F (36.8 C)  TempSrc: Oral  Height: 5\' 5"  (1.651 m)  Weight: 175 lb (79.379 kg)  SpO2: 90%      Wt 184 01/21/2011  >08/27/2011 186 > 180 09/29/2011 > 10/07/2011  183 > 11/11/2011  181 > 01/13/2012  183 >180 02/12/2012 > 06/09/2012 188 > 189 10/12/2012 >188 01/14/2013 > 01/21/2013 187 > 03/02/2013 >184 03/18/2013 >  03/28/2013 186> 06/06/2013  182 >  07/18/2013  179 >181 09/27/2013 > 12/08/2013   183 > 02/24/2014  178 > 06/09/2014 177 > 09/07/2014 175 > 12/05/2014   173 > 03/06/2015 173   HEENT mod non-specific turbinate edema.  Oropharynx no thrush or excess pnd or cobblestoning.  No JVD or cervical adenopathy. Mild accessory muscle hypertrophy. Trachea midline, nl thryroid. Chest was hyperinflated by percussion with diminished breath sounds bilaterally s wheeze/ Regular rate and rhythm without murmur gallop or rub or increase P2 or edema.  Abd: no hsm, nl excursion. Ext warm without  cyanosis or clubbing.           Katie Parrett NP-C  [facility]  06/05/2015

## 2015-06-05 NOTE — Assessment & Plan Note (Signed)
Doing well on O2 .

## 2015-06-05 NOTE — Assessment & Plan Note (Signed)
Doing well on current regimen  Patient's medications were reviewed today and patient education was given. Computerized medication calendar was adjusted/completed   Plan  Check with pharmacy to see if nebs if filed on Medicare B (and not Medicare D ) .  Follow med calendar closely and bring to each visit.  Continue on current regimen  Follow up Dr. [name]  In 4 months and As needed   Please contact office for sooner follow up if symptoms do not improve or worsen or seek emergency care

## 2015-06-05 NOTE — Patient Instructions (Addendum)
Check with pharmacy to see if nebs if filed on Medicare B (and not Medicare D ) .  Follow med calendar closely and bring to each visit.  Continue on current regimen  Follow up Dr. [name]  In 4 months and As needed   Please contact office for sooner follow up if symptoms do not improve or worsen or seek emergency care

## 2015-06-05 NOTE — Progress Notes (Signed)
Chart and office note reviewed in detail  > agree with a/p as outlined    

## 2015-06-06 NOTE — Addendum Note (Signed)
Addended by: Osa Craver on: 06/06/2015 12:36 PM   Modules accepted: Orders, Medications

## 2015-06-07 ENCOUNTER — Ambulatory Visit (HOSPITAL_COMMUNITY)
Admission: RE | Admit: 2015-06-07 | Discharge: 2015-06-07 | Disposition: A | Discharge status: Home / Self Care | Payer: Medicare Other | Source: Ambulatory Visit | Attending: Cardiology | Admitting: Cardiology

## 2015-06-07 DIAGNOSIS — K219 Gastro-esophageal reflux disease without esophagitis: Secondary | ICD-10-CM | POA: Diagnosis not present

## 2015-06-07 DIAGNOSIS — F419 Anxiety disorder, unspecified: Secondary | ICD-10-CM | POA: Diagnosis not present

## 2015-06-07 DIAGNOSIS — I6523 Occlusion and stenosis of bilateral carotid arteries: Secondary | ICD-10-CM | POA: Diagnosis not present

## 2015-06-07 DIAGNOSIS — G4730 Sleep apnea, unspecified: Secondary | ICD-10-CM | POA: Diagnosis not present

## 2015-06-07 DIAGNOSIS — F329 Major depressive disorder, single episode, unspecified: Secondary | ICD-10-CM | POA: Diagnosis not present

## 2015-06-07 DIAGNOSIS — I10 Essential (primary) hypertension: Secondary | ICD-10-CM | POA: Diagnosis not present

## 2015-06-15 ENCOUNTER — Other Ambulatory Visit: Payer: Self-pay | Admitting: Family Medicine

## 2015-06-15 ENCOUNTER — Other Ambulatory Visit: Payer: Self-pay | Admitting: Cardiology

## 2015-06-15 NOTE — Telephone Encounter (Signed)
Rx request sent to pharmacy.  

## 2015-06-19 ENCOUNTER — Other Ambulatory Visit: Payer: Self-pay | Admitting: Internal Medicine

## 2015-06-20 DIAGNOSIS — J449 Chronic obstructive pulmonary disease, unspecified: Secondary | ICD-10-CM | POA: Diagnosis not present

## 2015-06-22 DIAGNOSIS — J449 Chronic obstructive pulmonary disease, unspecified: Secondary | ICD-10-CM | POA: Diagnosis not present

## 2015-06-26 ENCOUNTER — Telehealth: Payer: Self-pay | Admitting: Internal Medicine

## 2015-06-26 MED ORDER — BUDESONIDE 0.5 MG/2ML IN SUSP: RESPIRATORY_TRACT | Status: DC

## 2015-06-26 NOTE — Telephone Encounter (Signed)
Spoke with patient-she confirmed the statement from [place] and states [facility] has already given her the medication and they just need an Rx. This has been sent electronically to [facility] listed with Dx code. Nothing more needed at this time.

## 2015-07-20 ENCOUNTER — Other Ambulatory Visit: Payer: Self-pay | Admitting: Family Medicine

## 2015-07-20 DIAGNOSIS — J449 Chronic obstructive pulmonary disease, unspecified: Secondary | ICD-10-CM | POA: Diagnosis not present

## 2015-07-20 NOTE — Telephone Encounter (Signed)
Last seen 04/25/15 Dr Dettinger  Last Vit D 04/05/15  15.5

## 2015-07-22 DIAGNOSIS — J449 Chronic obstructive pulmonary disease, unspecified: Secondary | ICD-10-CM | POA: Diagnosis not present

## 2015-07-27 ENCOUNTER — Telehealth: Payer: Self-pay | Admitting: Family Medicine

## 2015-07-27 DIAGNOSIS — J449 Chronic obstructive pulmonary disease, unspecified: Secondary | ICD-10-CM

## 2015-07-31 DIAGNOSIS — J441 Chronic obstructive pulmonary disease with (acute) exacerbation: Secondary | ICD-10-CM | POA: Diagnosis not present

## 2015-08-01 ENCOUNTER — Telehealth: Payer: Self-pay | Admitting: Internal Medicine

## 2015-08-01 DIAGNOSIS — J449 Chronic obstructive pulmonary disease, unspecified: Secondary | ICD-10-CM

## 2015-08-01 NOTE — Telephone Encounter (Signed)
lmtcb x1 

## 2015-08-02 NOTE — Telephone Encounter (Signed)
[phone], pt daughter [name] cb

## 2015-08-02 NOTE — Telephone Encounter (Signed)
Spoke with the pt's daughter  She states that her neb machine is no longer working  I have sent urgent request to [facility] for this  Nothing further needed

## 2015-08-03 ENCOUNTER — Telehealth: Payer: Self-pay | Admitting: Internal Medicine

## 2015-08-03 DIAGNOSIS — J449 Chronic obstructive pulmonary disease, unspecified: Secondary | ICD-10-CM

## 2015-08-03 DIAGNOSIS — K208 Other esophagitis: Secondary | ICD-10-CM | POA: Diagnosis not present

## 2015-08-03 DIAGNOSIS — G4700 Insomnia, unspecified: Secondary | ICD-10-CM | POA: Diagnosis not present

## 2015-08-03 NOTE — Telephone Encounter (Signed)
Spoke with pt's daughter, aware of below info.  Nothing further needed.

## 2015-08-03 NOTE — Telephone Encounter (Signed)
Daughter [name] calling. Pt still has no machine. Says the home health place cant fill the cpap order because it wasn't signed and that it would be 7-10 days. She is very upset!! [phone]. Wants it sent to [facility]. In [place]. Says it needs dx code.

## 2015-08-03 NOTE — Telephone Encounter (Signed)
Debbie with [facility] returned call, CB is [phone].

## 2015-08-03 NOTE — Telephone Encounter (Signed)
Spoke with pt's daughter, advised that I placed urgent order to [address] [facility] at 9:41 this morning after our initial conversation, and advised that these orders can take some time to be processed.   After speaking to [facility] about this referral, it looks like the referral was changed to a different practice and was already authorized. [facility] (the practice that assumed the phone note and order) and spoke with Jackelyn Poling, she stated that she would look into this referral and ensure that it is sent to [facility] as ordered.     lmtcb X1 for pt's daughter [name] to make aware.

## 2015-08-03 NOTE — Telephone Encounter (Signed)
Spoke with pt's daughter [name], states that she is requesting a new nebulizer rx with dx code be sent to [facility].  States that [place] told patient that it would take 7-10 business days to be shipped to patient, and she cannot wait that long for this order.   New urgent order has been sent to [facility] to have this sent to [facility], and existing order to [facility] cancelled.  Nothing further needed.

## 2015-08-03 NOTE — Telephone Encounter (Signed)
lmtcb x1 for pt's daughter. Order has been placed per Ashley's documentation.

## 2015-08-03 NOTE — Telephone Encounter (Signed)
Called Debbie from [facility] back, was advised by Jackelyn Poling that we placed order incorrectly.  I advised that we placed this order as we always do, but that we would place yet another order for this neb machine.  Order placed.   Pt's daughter has already had message left on phone to make aware of order being sent stat.

## 2015-08-03 NOTE — Telephone Encounter (Signed)
Pt daughter calling back yet again......................I let her know that the order has been placed.Hillery Hunter

## 2015-08-03 NOTE — Telephone Encounter (Signed)
IFY daughter seemed a bit irriatated , said she had spoken w/someone early this morning and nothing has been done, didn't see any other msg from today unless it was on an old msg.Hillery Hunter

## 2015-08-04 ENCOUNTER — Other Ambulatory Visit: Payer: Self-pay | Admitting: Internal Medicine

## 2015-08-07 ENCOUNTER — Other Ambulatory Visit: Payer: Self-pay | Admitting: *Deleted

## 2015-08-07 MED ORDER — VALSARTAN-HYDROCHLOROTHIAZIDE 80-12.5 MG PO TABS: 1.0000 | ORAL_TABLET | Freq: Every day | ORAL | 5 refills | Status: DC

## 2015-08-09 ENCOUNTER — Other Ambulatory Visit: Payer: Self-pay | Admitting: Family Medicine

## 2015-08-09 NOTE — Telephone Encounter (Signed)
Lmtcb, last seen in March

## 2015-08-09 NOTE — Telephone Encounter (Signed)
Filled 06/28/15, last seen 04/25/15. Route to pool. Call in at [facility] rx

## 2015-08-09 NOTE — Telephone Encounter (Signed)
Needs appointment prior to refill

## 2015-08-13 ENCOUNTER — Telehealth: Payer: Self-pay | Admitting: Family Medicine

## 2015-08-13 NOTE — Telephone Encounter (Signed)
This is being addressed in an open refill encounter - by covering provider

## 2015-08-15 ENCOUNTER — Encounter: Payer: Self-pay | Admitting: Family Medicine

## 2015-08-15 ENCOUNTER — Telehealth: Payer: Self-pay | Admitting: Family Medicine

## 2015-08-15 ENCOUNTER — Ambulatory Visit (INDEPENDENT_AMBULATORY_CARE_PROVIDER_SITE_OTHER): Payer: Medicare Other | Admitting: Family Medicine

## 2015-08-15 VITALS — BP 149/91 | HR 102 | Temp 97.8°F | Ht 65.0 in | Wt 171.0 lb

## 2015-08-15 DIAGNOSIS — J441 Chronic obstructive pulmonary disease with (acute) exacerbation: Secondary | ICD-10-CM | POA: Diagnosis not present

## 2015-08-15 DIAGNOSIS — I10 Essential (primary) hypertension: Secondary | ICD-10-CM

## 2015-08-15 MED ORDER — ALPRAZOLAM 1 MG PO TABS: ORAL_TABLET | ORAL | 1 refills | Status: DC

## 2015-08-15 NOTE — Telephone Encounter (Signed)
Closing encounter. Pt was in the office today 08/15/15

## 2015-08-15 NOTE — Telephone Encounter (Signed)
Patient coming in to be seen 

## 2015-08-15 NOTE — Progress Notes (Signed)
Subjective:    Patient ID: Katie Bryant, female    DOB: 20-Jan-1928, 82 y.o.   MRN: [id number]  HPI a 50-year-old female who I have not seen in some time who is here today to discuss her nerves. She does have some dementia but in almost 2 years I have seen her it has not really progressed to my exam and I'm reluctant to label her Alzheimer's which tends to be more progressive, quickly. She had a prescription for alprazolam 1 mg, #60 that has lasted her for months. She takes this medicine as needed and certainly to make 60 pills last 120 days does not seem to abuse it. She still continues to live by herself cook her meals total of herself drive her car.  Patient Active Problem List   Diagnosis Date Noted  . Anxiety and depression 04/25/2015  . Alzheimer's disease 03/29/2015  . COPD exacerbation ([place]) 01/18/2013  . Lymphadenopathy 10/07/2012  . Chronic respiratory failure with hypercapnia ([place]) 03/11/2011  . HTN (hypertension) 02/12/2011  . Hyperlipidemia 02/12/2011  . Leukocytosis 12/23/2010  . Insomnia 12/22/2010  . Aortic stenosis 07/03/2010  . G E R D 06/06/2007  . Morbid obesity ([place]) 05/25/2007  . COPD GOLD III/ 02 dep  05/25/2007   Outpatient Encounter Prescriptions as of 08/15/2015  Medication Sig  . acetaminophen (TYLENOL) 500 MG tablet Take 500 mg by mouth every 6 (six) hours as needed (pain).   Marland Kitchen albuterol (PROAIR HFA) 108 (90 Base) MCG/ACT inhaler INHALE TWO PUFFS BY MOUTH EVERY 6 HOURS AS NEEDED (Patient taking differently: INHALE TWO PUFFS BY MOUTH EVERY 6 HOURS AS NEEDED)  . ALPRAZolam (XANAX) 1 MG tablet TAKE  (1)  TABLET TWICE A DAY. (Patient taking differently: TAKE  (1)  TABLET TWICE A DAY  AS NEEDED FOR ANXIETY AND INSOMNIA)  . aspirin 81 MG tablet Take 81 mg by mouth daily.  Marland Kitchen atorvastatin (LIPITOR) 40 MG tablet TAKE 1 TABLET ONCE DAILY AT 6PM  . budesonide (PULMICORT) 0.5 MG/2ML nebulizer solution NEBULIZE 1 VIAL TWICE A DAY  . buPROPion (WELLBUTRIN SR) 150 MG 12  hr tablet Take 150 mg by mouth daily.  . chlorpheniramine (CHLOR-TRIMETON) 4 MG tablet Take 8 mg by mouth every 6 (six) hours as needed (DRIPPY NOSE). Reported on 03/09/2015  . dextromethorphan (DELSYM) 30 MG/5ML liquid Take 2 tsp every 12 hours as needed for cough  . fenofibrate micronized (LOFIBRA) 134 MG capsule TAKE (1) CAPSULE DAILY BEFORE BREAKFAST.  Marland Kitchen guaiFENesin (MUCINEX) 600 MG 12 hr tablet Take 1 every 12 hours as needed with flutter valve for cough and congestion  . ipratropium-albuterol (DUONEB) 0.5-2.5 (3) MG/3ML SOLN USE ONE VIAL 4 TIMES DAILY VIA NEBULIZER  . ipratropium-albuterol (DUONEB) 0.5-2.5 (3) MG/3ML SOLN NEBULIZE 1 VIAL 4 TIMES A DAY  . levothyroxine (SYNTHROID, LEVOTHROID) 50 MCG tablet TAKE 1 TABLET DAILY  . meclizine (ANTIVERT) 25 MG tablet Take 25 mg by mouth every 8 (eight) hours as needed for dizziness.   . metoprolol tartrate (LOPRESSOR) 25 MG tablet TAKE (1/2) TABLET TWICE DAILY.  . montelukast (SINGULAIR) 10 MG tablet TAKE ONE TABLET AT BEDTIME  . NAMENDA XR 28 MG CP24 Take 1 capsule by mouth daily.   . OXYGEN Oxygen 3lpm 24/7, Increase to 4L as needed for shortness of breath with activity  . ranitidine (ZANTAC) 150 MG tablet Take 1 tablet (150 mg total) by mouth at bedtime.  Marland Kitchen Respiratory Therapy Supplies (FLUTTER) DEVI As needed  . sodium chloride (OCEAN) 0.65 % SOLN  nasal spray 2 puffs every 4-6 hours as needed for nasal congestion  . valsartan-hydrochlorothiazide (DIOVAN-HCT) 80-12.5 MG tablet Take 1 tablet by mouth daily.  . Vitamin D, Ergocalciferol, (DRISDOL) 50000 units CAPS capsule Take 1 capsule (50,000 Units total) by mouth every 7 (seven) days.   No facility-administered encounter medications on file as of 08/15/2015.       Review of Systems  Constitutional: Negative.   Respiratory: Negative.   Cardiovascular: Negative.   Neurological: Negative.   Psychiatric/Behavioral: The patient is nervous/anxious.        Objective:   Physical Exam    Constitutional: She is oriented to person, place, and time. She appears well-developed and well-nourished.  Cardiovascular: Normal rate and regular rhythm.   Pulmonary/Chest: Effort normal and breath sounds normal.  Wears oxygen 24 7  Neurological: She is alert and oriented to person, place, and time.  Psychiatric: She has a normal mood and affect. Her behavior is normal.   BP (!) 149/91 (BP Location: Right Arm, Patient Position: Sitting, Cuff Size: Normal)   Pulse (!) 102   Temp 97.8 F (36.6 C) (Oral)   Ht 5\' 5"  (1.651 m)   Wt 171 lb (77.6 kg)   BMI 28.46 kg/m         Assessment & Plan:  1. Essential hypertension Blood pressure slightly elevated as is pulse  2. COPD exacerbation (HCC) No recent exacerbations. Continue with oxygen and treatments Did refill Xanax 1 mg #60  Wardell Honour MD

## 2015-08-20 DIAGNOSIS — J449 Chronic obstructive pulmonary disease, unspecified: Secondary | ICD-10-CM | POA: Diagnosis not present

## 2015-08-22 ENCOUNTER — Other Ambulatory Visit: Payer: Self-pay | Admitting: Internal Medicine

## 2015-08-22 DIAGNOSIS — J449 Chronic obstructive pulmonary disease, unspecified: Secondary | ICD-10-CM | POA: Diagnosis not present

## 2015-09-03 ENCOUNTER — Ambulatory Visit: Payer: Self-pay | Admitting: Family Medicine

## 2015-09-05 ENCOUNTER — Ambulatory Visit (INDEPENDENT_AMBULATORY_CARE_PROVIDER_SITE_OTHER): Payer: Medicare Other | Admitting: Family Medicine

## 2015-09-05 ENCOUNTER — Encounter: Payer: Self-pay | Admitting: Family Medicine

## 2015-09-05 VITALS — BP 137/69 | HR 67 | Temp 97.4°F | Ht 65.0 in | Wt 172.8 lb

## 2015-09-05 DIAGNOSIS — H9311 Tinnitus, right ear: Secondary | ICD-10-CM | POA: Diagnosis not present

## 2015-09-05 NOTE — Progress Notes (Signed)
BP 137/69 (BP Location: Right Arm, Patient Position: Sitting, Cuff Size: Large)   Pulse 67   Temp 97.4 F (36.3 C) (Oral)   Ht 5\' 5"  (1.651 m)   Wt 172 lb 12.8 oz (78.4 kg)   BMI 28.76 kg/m    Subjective:    Patient ID: Katie Bryant, female    DOB: [birth year], 82 y.o.   MRN: [id number]  HPI: Katie Bryant is a 82 y.o. female presenting on 09/05/2015 for Discuss medications and Roaring in right ear   HPI Ringing in ears Patient complains of ringing in her ears especially the right ear more than the left. She says this is been causing her more issues over the past couple years. She feels like it's happening more frequently. She has had hearing testing previously and was recommended for hearing aids but she did not want them at that time. She denies any nasal congestion or fevers or chills or postnasal drainage or sinus pressure or congestion. She denies any pain in her years. She feels like she can still hear but does have the ringing more frequently especially in the early morning when she wakes up and there is no sound anywhere.  Relevant past medical, surgical, family and social history reviewed and updated as indicated. Interim medical history since our last visit reviewed. Allergies and medications reviewed and updated.  Review of Systems  Constitutional: Negative for chills and fever.  HENT: Positive for tinnitus. Negative for congestion, ear discharge and ear pain.   Eyes: Negative for redness and visual disturbance.  Respiratory: Negative for chest tightness and shortness of breath.   Cardiovascular: Negative for chest pain and leg swelling.  Genitourinary: Negative for difficulty urinating and dysuria.  Musculoskeletal: Negative for back pain and gait problem.  Skin: Negative for rash.  Neurological: Negative for light-headedness and headaches.  Psychiatric/Behavioral: Negative for agitation and behavioral problems.  All other systems reviewed and are  negative.   Per HPI unless specifically indicated above      Objective:    BP 137/69 (BP Location: Right Arm, Patient Position: Sitting, Cuff Size: Large)   Pulse 67   Temp 97.4 F (36.3 C) (Oral)   Ht 5\' 5"  (1.651 m)   Wt 172 lb 12.8 oz (78.4 kg)   BMI 28.76 kg/m   Wt Readings from Last 3 Encounters:  09/05/15 172 lb 12.8 oz (78.4 kg)  08/15/15 171 lb (77.6 kg)  06/05/15 175 lb (79.4 kg)    Physical Exam  Constitutional: She is oriented to person, place, and time. She appears well-developed and well-nourished. No distress.  HENT:  Right Ear: Tympanic membrane and external ear normal.  Left Ear: Tympanic membrane and external ear normal.  Nose: Nose normal.  Mouth/Throat: Oropharynx is clear and moist and mucous membranes are normal. No oropharyngeal exudate.  Eyes: Conjunctivae are normal.  Neck: Neck supple. No thyromegaly present.  Cardiovascular: Normal rate, regular rhythm, normal heart sounds and intact distal pulses.   No murmur heard. Pulmonary/Chest: Effort normal and breath sounds normal. No respiratory distress. She has no wheezes.  Musculoskeletal: Normal range of motion. She exhibits no edema or tenderness.  Lymphadenopathy:    She has no cervical adenopathy.  Neurological: She is alert and oriented to person, place, and time. Coordination normal.  Skin: Skin is warm and dry. No rash noted. She is not diaphoretic.  Psychiatric: She has a normal mood and affect. Her behavior is normal.  Nursing note and vitals reviewed.  Assessment & Plan:   Problem List Items Addressed This Visit    None    Visit Diagnoses    Ringing in ears, right    -  Primary   Patient has known hearing loss in the right side, she does not want a hearing aid at this time, this is the likely cause    Patient declines going to audiologist today or an ENT.   Follow up plan: Return in about 3 months (around 12/06/2015), or if symptoms worsen or fail to improve, for Recheck anxiety  and breathing.  Counseling provided for all of the vaccine components No orders of the defined types were placed in this encounter.   Caryl Pina, MD [facility] 09/05/2015, 10:29 AM

## 2015-09-13 ENCOUNTER — Telehealth: Payer: Self-pay | Admitting: Family Medicine

## 2015-09-13 NOTE — Telephone Encounter (Signed)
Called patient and told her she has a refill on her Xanax from 08/15/15

## 2015-09-17 ENCOUNTER — Other Ambulatory Visit: Payer: Self-pay | Admitting: Family Medicine

## 2015-09-17 NOTE — Telephone Encounter (Signed)
Go ahead and phone in the Xanax

## 2015-09-19 ENCOUNTER — Other Ambulatory Visit: Payer: Self-pay | Admitting: Family Medicine

## 2015-09-19 ENCOUNTER — Other Ambulatory Visit: Payer: Self-pay | Admitting: Internal Medicine

## 2015-09-20 ENCOUNTER — Other Ambulatory Visit: Payer: Self-pay | Admitting: *Deleted

## 2015-09-20 ENCOUNTER — Other Ambulatory Visit: Payer: Self-pay

## 2015-09-20 DIAGNOSIS — J449 Chronic obstructive pulmonary disease, unspecified: Secondary | ICD-10-CM | POA: Diagnosis not present

## 2015-09-20 MED ORDER — ATORVASTATIN CALCIUM 40 MG PO TABS: ORAL_TABLET | ORAL | 0 refills | Status: DC

## 2015-09-22 DIAGNOSIS — J449 Chronic obstructive pulmonary disease, unspecified: Secondary | ICD-10-CM | POA: Diagnosis not present

## 2015-09-24 ENCOUNTER — Other Ambulatory Visit: Payer: Self-pay | Admitting: Family Medicine

## 2015-09-28 ENCOUNTER — Other Ambulatory Visit: Payer: Self-pay | Admitting: Family Medicine

## 2015-10-03 ENCOUNTER — Ambulatory Visit (INDEPENDENT_AMBULATORY_CARE_PROVIDER_SITE_OTHER): Payer: Medicare Other

## 2015-10-03 DIAGNOSIS — Z23 Encounter for immunization: Secondary | ICD-10-CM | POA: Diagnosis not present

## 2015-10-04 ENCOUNTER — Ambulatory Visit: Payer: Self-pay

## 2015-10-08 ENCOUNTER — Ambulatory Visit: Payer: Self-pay | Admitting: Internal Medicine

## 2015-10-16 ENCOUNTER — Encounter: Payer: Self-pay | Admitting: Internal Medicine

## 2015-10-16 ENCOUNTER — Ambulatory Visit (INDEPENDENT_AMBULATORY_CARE_PROVIDER_SITE_OTHER): Payer: Medicare Other | Admitting: Internal Medicine

## 2015-10-16 VITALS — BP 148/66 | HR 104 | Ht 65.0 in | Wt 171.4 lb

## 2015-10-16 DIAGNOSIS — J449 Chronic obstructive pulmonary disease, unspecified: Secondary | ICD-10-CM | POA: Diagnosis not present

## 2015-10-16 DIAGNOSIS — J9612 Chronic respiratory failure with hypercapnia: Secondary | ICD-10-CM | POA: Diagnosis not present

## 2015-10-16 DIAGNOSIS — J441 Chronic obstructive pulmonary disease with (acute) exacerbation: Secondary | ICD-10-CM | POA: Diagnosis not present

## 2015-10-16 NOTE — Assessment & Plan Note (Signed)
-   PFT's 06/11/2007  FEV1   0.70 (34%) ratio 32 with 49% improvement p B2    - HFA 75% effective p coaching 06/20/2011 > 50% 06/24/2011  -Med calendar 09/27/2013 > did not bring as requested 06/09/14 , 06/05/2015 redone  - improved reconciliation 09/07/2014 and 03/07/2015 and 10/16/2015    I had an extended discussion with the patient and daughter reviewing all relevant studies completed to date and  lasting 15 to 20 minutes of a 25 minute visit    1) she is doing exceptionally well now despite a complex regimen using blister packs for her maint and rarely needing any of her prns  With good activity tol - albeit on 3-4lpm) and no tendency to aecopd on bud bid/duoneb qid which she can afford through Medicare B/ dme company   2)  Each maintenance medication was reviewed in detail including most importantly the difference between maintenance and prns and under what circumstances the prns are to be triggered using an action plan format that is not reflected in the computer generated alphabetically organized AVS but trather by a customized med calendar that reflects the AVS meds with confirmed 100% correlation.   Please see instructions for details which were reviewed in writing and the patient given a copy highlighting the part that I personally wrote and discussed at today's ov.

## 2015-10-16 NOTE — Progress Notes (Signed)
Subjective:     Patient ID: Katie Bryant, female   DOB: 10-06-1928    MRN: [id number]   Brief patient profile:  30 yowf quit smoking 1992 with GOLD III copd dx 2009 eval in pulm clinic p hospitalized at [facility] 12/21/10   History of Present Illness  09/27/2013 Follow up and Med review  Hx of COPD III/ 02 dep/ has med  Patient returns for followup and medication review. We reviewed all her medications and organized them into a medication calendar with patient education. Does get easily confused with meds. Appears family is helping with meds now.  Recently dx with dementia, now on Namenda. Seems to be some improvement with memory.  rec No change rx/ follow med calendar and bring to each visit/ daughter [name]    03/06/2015  f/u ov/Bera Pinela re: copd gold III / 02 dep 3lpm x 4 with activity   Chief Complaint  Patient presents with  . Follow-up    Breathing overall doing well. She is using albuterol inhaler almost every day.   doe on 02 = MMRC2 = can't walk a nl pace on a flat grade s sob - does ok at slow pace, flat grade at 4lpm   >>no changes    06/05/2015 Follow up : COPD  Pt returns for 3 month follow up .  Says overall she is doing well. Here with her daughter We reviewed all her meds and organized them into a med calendar with pt education.  She gets meds done at [facility] . They package her pills up in divided sections with 4 time frames and give her weekly packs. Family calls  Pharmacy if there is changes and pharmacy verifies changes with MD  and adjusts packages. Meds are delivered , they pay $15 month for this service.  PVX and Prevnar are utd.  No longer getting nebs thru [facility] , now thru pharmacy bc [place] contract with her insurance. Not sure if filed on Medicare B at pharmacy.  rec Check with pharmacy to see if nebs if filed on Medicare B (and not Medicare D ) .  Follow med calendar closely and bring to each visit.  Continue on current regimen  Follow up Dr. [name]  In 4  months and As needed      10/16/2015  f/u ov/Justn Quale re:  Copd 3lpm increase to 4lpm with exertion bud/ duoneb  Chief Complaint  Patient presents with  . Follow-up    Breathing is doing well overall. She uses albuterol 2 x daily on average.   doe = MMRC2 = can't walk a nl pace on a flat grade s sob but does fine slow and flat eg [employer] shopping on 4lpm     No obvious day to day or daytime variability or assoc chronic cough or cp or chest tightness, subjective wheeze or overt sinus or hb symptoms. No unusual exp hx or h/o childhood pna/ asthma or knowledge of premature birth.  Sleeping ok without nocturnal  or early am exacerbation  of respiratory  c/o's or need for noct saba. Also denies any obvious fluctuation of symptoms with weather or environmental changes or other aggravating or alleviating factors except as outlined above   Current Medications, Allergies, Complete Past Medical History, Past Surgical History, Family History, and Social History were reviewed in [employer] record.  ROS  The following are not active complaints unless bolded sore throat, dysphagia, dental problems, itching, sneezing,  nasal congestion or excess/ purulent secretions, ear  ache,   fever, chills, sweats, unintended wt loss, classically pleuritic or exertional cp, hemoptysis,  orthopnea pnd or leg swelling, presyncope, palpitations, abdominal pain, anorexia, nausea, vomiting, diarrhea  or change in bowel or bladder habits, change in stools or urine, dysuria,hematuria,  rash, arthralgias, visual complaints, headache, numbness, weakness or ataxia or problems with walking or coordination,  change in mood/affect or memory.                  Objective:  Physical Exam  Stoic amb wf nad   Vital signs reviewed - Note on arrival 02 sats  90% on 3lpm POC pulsing       Wt 184 01/21/2011  >08/27/2011 186 > 180 09/29/2011 > 10/07/2011  183 > 11/11/2011  181 > 01/13/2012  183 >180 02/12/2012 > 06/09/2012  188 > 189 10/12/2012 >188 01/14/2013 > 01/21/2013 187 > 03/02/2013 >184 03/18/2013 >  03/28/2013 186> 06/06/2013  182 >  07/18/2013  179 >181 09/27/2013 > 12/08/2013   183 > 02/24/2014  178 > 06/09/2014 177 > 09/07/2014 175 > 12/05/2014   173 > 03/06/2015 173>  10/16/2015   171   HEENT mod non-specific turbinate edema.  Oropharynx no thrush or excess pnd or cobblestoning.  No JVD or cervical adenopathy. Mild accessory muscle hypertrophy. Trachea midline, nl thryroid. Chest was hyperinflated by percussion with diminished breath sounds bilaterally s wheeze/ Regular rate and rhythm without murmur gallop or rub or increase P2 or edema.  Abd: no hsm, nl excursion. Ext warm without cyanosis or clubbing.

## 2015-10-16 NOTE — Assessment & Plan Note (Signed)
-   sats 90% RA 03/11/2011     - Dr Annamaria Boots started 02 around 2010    - 3 lpm 24/7 except 4lpm with activity as of 03/02/2013     - HC03 trending in low 30's since 2012 so likely hypercarbic component as well   - 02/24/2014   Walked RA x one lap @ 185 stopped due to  desat to 83% corrected on 4lpm    - 12/05/2014   Walked 3lpm pulsed x 2 laps @ 185 ft each stopped due to sob with sat 89% at moderate pace    - 03/06/2015   Walked 4lpm 2 laps @ 185 ft each stopped due to  Sob/ desat to 86% at pace faster than her nl    rx as of 10/16/2015  = 3lpm 24/7 except 4lpm when walking more than room to room   Adequate control on present rx, reviewed > no change in rx needed

## 2015-10-16 NOTE — Patient Instructions (Signed)
See calendar for specific medication instructions and bring it back for each and every office visit for every healthcare provider you see.  Without it,  you may not receive the best quality medical care that we feel you deserve. ° °You will note that the calendar groups together  your maintenance  medications that are timed at particular times of the day.  Think of this as your checklist for what your doctor has instructed you to do until your next evaluation to see what benefit  there is  to staying on a consistent group of medications intended to keep you well.  The other group at the bottom is entirely up to you to use as you see fit  for specific symptoms that may arise between visits that require you to treat them on an as needed basis.  Think of this as your action plan or "what if" list.  ° °Separating the top medications from the bottom group is fundamental to providing you adequate care going forward.   ° ° °Please schedule a follow up visit in 6 months but call sooner if needed  ° ° ° ° ° ° ° °

## 2015-10-20 DIAGNOSIS — J449 Chronic obstructive pulmonary disease, unspecified: Secondary | ICD-10-CM | POA: Diagnosis not present

## 2015-10-22 DIAGNOSIS — J449 Chronic obstructive pulmonary disease, unspecified: Secondary | ICD-10-CM | POA: Diagnosis not present

## 2015-10-23 ENCOUNTER — Other Ambulatory Visit: Payer: Self-pay | Admitting: Internal Medicine

## 2015-10-30 ENCOUNTER — Other Ambulatory Visit: Payer: Self-pay | Admitting: Cardiology

## 2015-11-06 ENCOUNTER — Telehealth: Payer: Self-pay | Admitting: Family Medicine

## 2015-11-06 NOTE — Telephone Encounter (Signed)
Spoke with pt regarding constipation Instructed pt to try Miralax She will try and let us know if sxs worsen

## 2015-11-07 ENCOUNTER — Telehealth: Payer: Self-pay | Admitting: Family Medicine

## 2015-11-07 NOTE — Telephone Encounter (Signed)
Patient talked to triage °

## 2015-11-08 ENCOUNTER — Other Ambulatory Visit: Payer: Self-pay | Admitting: *Deleted

## 2015-11-08 ENCOUNTER — Telehealth: Payer: Self-pay | Admitting: Family Medicine

## 2015-11-08 MED ORDER — MEMANTINE HCL ER 28 MG PO CP24
28.0000 mg | ORAL_CAPSULE | Freq: Every day | ORAL | 1 refills | Status: DC
Start: 2015-11-08 — End: 2016-01-02

## 2015-11-09 ENCOUNTER — Ambulatory Visit: Payer: Self-pay | Admitting: Nurse Practitioner

## 2015-11-09 NOTE — Telephone Encounter (Signed)
Patient wanted to know foods that she can eat. Spoke with patient.

## 2015-11-09 NOTE — Telephone Encounter (Signed)
Please call regarding constipation and what she can eat.  She is calling everyday.

## 2015-11-14 ENCOUNTER — Other Ambulatory Visit: Payer: Self-pay | Admitting: Family Medicine

## 2015-11-15 ENCOUNTER — Encounter: Payer: Self-pay | Admitting: Family Medicine

## 2015-11-15 ENCOUNTER — Ambulatory Visit (INDEPENDENT_AMBULATORY_CARE_PROVIDER_SITE_OTHER): Payer: Medicare Other | Admitting: Family Medicine

## 2015-11-15 VITALS — BP 153/70 | HR 78 | Temp 97.8°F | Wt 170.0 lb

## 2015-11-15 DIAGNOSIS — F0280 Dementia in other diseases classified elsewhere without behavioral disturbance: Secondary | ICD-10-CM | POA: Diagnosis not present

## 2015-11-15 DIAGNOSIS — G309 Alzheimer's disease, unspecified: Secondary | ICD-10-CM

## 2015-11-15 DIAGNOSIS — J449 Chronic obstructive pulmonary disease, unspecified: Secondary | ICD-10-CM

## 2015-11-15 DIAGNOSIS — I10 Essential (primary) hypertension: Secondary | ICD-10-CM | POA: Diagnosis not present

## 2015-11-15 NOTE — Progress Notes (Signed)
Subjective:    Patient ID: Katie Bryant, female    DOB: March 25, 1928, 82 y.o.   MRN: [id number]  HPI 82 year old female who is here for allegedly constipation but she denies being constipated. She took apparently some MiraLAX and that helped the problem. She has not been eating her regular diet. Continue align at today's visit as per usual she complains about her daughter who has power of attorney and she says tries to control her life. She is very stubborn about this and does not want her daughter telling her what to do which doctors to see etc. as usual I just listened and suggested that she try to go along with the daughter is much as she could.  Patient Active Problem List   Diagnosis Date Noted  . Anxiety and depression 04/25/2015  . Alzheimer's disease 03/29/2015  . COPD exacerbation ([place]) 01/18/2013  . Lymphadenopathy 10/07/2012  . Chronic respiratory failure with hypercapnia ([place]) 03/11/2011  . HTN (hypertension) 02/12/2011  . Hyperlipidemia 02/12/2011  . Leukocytosis 12/23/2010  . Insomnia 12/22/2010  . Aortic stenosis 07/03/2010  . G E R D 06/06/2007  . Morbid obesity ([place]) 05/25/2007  . COPD GOLD III/ 02 dep  05/25/2007   Outpatient Encounter Prescriptions as of 11/15/2015  Medication Sig  . acetaminophen (TYLENOL) 500 MG tablet Take 500 mg by mouth every 6 (six) hours as needed (pain).   Marland Kitchen ALPRAZolam (XANAX) 1 MG tablet TAKE (1) TABLET TWICE A DAY AS NEEDED FOR ANXIETY OR INSOMNIA.  Marland Kitchen aspirin 81 MG tablet Take 81 mg by mouth daily.  Marland Kitchen atorvastatin (LIPITOR) 40 MG tablet Take 1 tablet by mouth daily  . budesonide (PULMICORT) 0.5 MG/2ML nebulizer solution NEBULIZE 1 VIAL TWICE A DAY  . buPROPion (WELLBUTRIN SR) 150 MG 12 hr tablet Take 150 mg by mouth daily.  . chlorpheniramine (CHLOR-TRIMETON) 4 MG tablet Take 8 mg by mouth every 6 (six) hours as needed (DRIPPY NOSE). Reported on 03/09/2015  . dextromethorphan (DELSYM) 30 MG/5ML liquid Take 2 tsp every 12 hours as needed  for cough  . fenofibrate micronized (LOFIBRA) 134 MG capsule TAKE (1) CAPSULE DAILY BEFORE BREAKFAST.  Marland Kitchen guaiFENesin (MUCINEX) 600 MG 12 hr tablet Take 1 every 12 hours as needed with flutter valve for cough and congestion  . ipratropium-albuterol (DUONEB) 0.5-2.5 (3) MG/3ML SOLN USE ONE VIAL 4 TIMES DAILY VIA NEBULIZER  . ipratropium-albuterol (DUONEB) 0.5-2.5 (3) MG/3ML SOLN NEBULIZE 1 VIAL 4 TIMES A DAY  . levothyroxine (SYNTHROID, LEVOTHROID) 50 MCG tablet TAKE 1 TABLET DAILY  . meclizine (ANTIVERT) 25 MG tablet Take 25 mg by mouth every 8 (eight) hours as needed for dizziness.   . memantine (NAMENDA XR) 28 MG CP24 24 hr capsule Take 1 capsule (28 mg total) by mouth daily.  . metoprolol tartrate (LOPRESSOR) 25 MG tablet TAKE (1/2) TABLET TWICE DAILY.  . montelukast (SINGULAIR) 10 MG tablet TAKE ONE TABLET AT BEDTIME  . OXYGEN Oxygen 3lpm 24/7, Increase to 4L as needed for shortness of breath with activity  . PROAIR HFA 108 (90 Base) MCG/ACT inhaler INHALE 2 PUFFS BY MOUTH EVERY 6 HOURS AS NEEDED  . ranitidine (ZANTAC) 150 MG tablet TAKE ONE TABLET AT BEDTIME  . Respiratory Therapy Supplies (FLUTTER) DEVI As needed  . sodium chloride (OCEAN) 0.65 % SOLN nasal spray 2 puffs every 4-6 hours as needed for nasal congestion  . valsartan-hydrochlorothiazide (DIOVAN-HCT) 80-12.5 MG tablet Take 1 tablet by mouth daily.  . Vitamin D, Ergocalciferol, (DRISDOL) 50000 units CAPS capsule  Take 1 capsule (50,000 Units total) by mouth every 7 (seven) days.   No facility-administered encounter medications on file as of 11/15/2015.       Review of Systems  Constitutional: Negative.   Respiratory: Positive for shortness of breath.   Cardiovascular: Negative.   Gastrointestinal: Positive for constipation.  Psychiatric/Behavioral: Negative.        Objective:   Physical Exam  Constitutional: She is oriented to person, place, and time. She appears well-developed and well-nourished.  Cardiovascular:  Normal rate and regular rhythm.   Pulmonary/Chest: Effort normal. She has wheezes. She has rales.  Neurological: She is alert and oriented to person, place, and time.  Patient tells me she has dementia and I believe there is some memory loss but she is able to drive and function independently.  Psychiatric: She has a normal mood and affect.   BP (!) 153/70   Pulse 78   Temp 97.8 F (36.6 C) (Oral)   Wt 170 lb (77.1 kg)   BMI 28.29 kg/m         Assessment & Plan:  1. Essential hypertension Blood pressures 153/70 today. That is good for 82 years old  2. COPD GOLD III/ 02 dep  Wears oxygen continuously and does not appear short of breath today and air is moving well. This is managed by pulmonologist  3. Alzheimer's dementia without behavioral disturbance, unspecified timing of dementia onset I do not think she has Alzheimer's. Alzheimer's tends to be more progressive and aggressive and I have not seen any significant change in the proximal 2 years since I have known her.  Wardell Honour MD

## 2015-11-19 ENCOUNTER — Telehealth: Payer: Self-pay | Admitting: Family Medicine

## 2015-11-19 NOTE — Telephone Encounter (Signed)
Spoke with pt regarding constipation Pt is having daily BMs Pt will call for appt if sxs worsen or persist

## 2015-11-20 DIAGNOSIS — J449 Chronic obstructive pulmonary disease, unspecified: Secondary | ICD-10-CM | POA: Diagnosis not present

## 2015-11-22 DIAGNOSIS — J449 Chronic obstructive pulmonary disease, unspecified: Secondary | ICD-10-CM | POA: Diagnosis not present

## 2015-12-04 ENCOUNTER — Other Ambulatory Visit: Payer: Self-pay | Admitting: Family Medicine

## 2015-12-05 ENCOUNTER — Other Ambulatory Visit: Payer: Self-pay | Admitting: Family Medicine

## 2015-12-05 DIAGNOSIS — J441 Chronic obstructive pulmonary disease with (acute) exacerbation: Secondary | ICD-10-CM | POA: Diagnosis not present

## 2015-12-05 NOTE — Telephone Encounter (Signed)
Refill called to [name] 

## 2015-12-05 NOTE — Telephone Encounter (Signed)
Tried to contact patient. No answer.

## 2015-12-10 ENCOUNTER — Ambulatory Visit (INDEPENDENT_AMBULATORY_CARE_PROVIDER_SITE_OTHER): Payer: Medicare Other | Admitting: Nurse Practitioner

## 2015-12-10 ENCOUNTER — Ambulatory Visit (INDEPENDENT_AMBULATORY_CARE_PROVIDER_SITE_OTHER): Payer: Medicare Other

## 2015-12-10 ENCOUNTER — Encounter: Payer: Self-pay | Admitting: Nurse Practitioner

## 2015-12-10 VITALS — BP 134/73 | HR 78 | Temp 97.5°F | Ht 65.0 in | Wt 170.0 lb

## 2015-12-10 DIAGNOSIS — K5900 Constipation, unspecified: Secondary | ICD-10-CM

## 2015-12-10 NOTE — Progress Notes (Signed)
   Subjective:    Patient ID: Katie Bryant, female    DOB: Feb 19, 1928, 82 y.o.   MRN: [id number]  HPI Patient comes in today C/o constipation. This has been going on for > 3 weeks- SHe had dr. Warrick Parisian told her through telephone message to try mag citrate. It helped for 2 days- HSe has been having to strain to go to have a bowel movement. She has been taking miralax intermittently. SHe will have good results then will not go for 3-4 days again.    Review of Systems  Constitutional: Negative.   HENT: Negative.   Respiratory: Negative.   Cardiovascular: Negative.   Gastrointestinal: Negative.   Genitourinary: Negative.   Neurological: Negative.   Psychiatric/Behavioral: Negative.   All other systems reviewed and are negative.      Objective:   Physical Exam  Constitutional: She is oriented to person, place, and time. She appears well-developed and well-nourished. No distress.  Cardiovascular: Normal rate.   Pulmonary/Chest: Effort normal.  Neurological: She is alert and oriented to person, place, and time.  Skin: Skin is warm.  Psychiatric: She has a normal mood and affect. Her behavior is normal. Judgment and thought content normal.   BP 134/73   Pulse 78   Temp 97.5 F (36.4 C) (Oral)   Ht 5\' 5"  (1.651 m)   Wt 170 lb (77.1 kg)   SpO2 (!) 89% Comment: on room air  BMI 28.29 kg/m   KUB-  Moderate stool burden-Preliminary reading by Ronnald Collum, FNP  [facility]       Assessment & Plan:  1. Constipation, unspecified constipation type Try mag citrate today Start  miralax regimen daily after good results Increase fiber in diet Force fluids RTO prn - DG Abd 1 View; Future  Mary-Margaret Hassell Done, FNP

## 2015-12-10 NOTE — Patient Instructions (Signed)
Constipation, Adult °Constipation is when a person: °· Poops (has a bowel movement) fewer times in a week than normal. °· Has a hard time pooping. °· Has poop that is dry, hard, or bigger than normal. ° °Follow these instructions at home: °Eating and drinking ° °· Eat foods that have a lot of fiber, such as: °? Fresh fruits and vegetables. °? Whole grains. °? Beans. °· Eat less of foods that are high in fat, low in fiber, or overly processed, such as: °? French fries. °? Hamburgers. °? Cookies. °? Candy. °? Soda. °· Drink enough fluid to keep your pee (urine) clear or pale yellow. °General instructions °· Exercise regularly or as told by your doctor. °· Go to the restroom when you feel like you need to poop. Do not hold it in. °· Take over-the-counter and prescription medicines only as told by your doctor. These include any fiber supplements. °· Do pelvic floor retraining exercises, such as: °? Doing deep breathing while relaxing your lower belly (abdomen). °? Relaxing your pelvic floor while pooping. °· Watch your condition for any changes. °· Keep all follow-up visits as told by your doctor. This is important. °Contact a doctor if: °· You have pain that gets worse. °· You have a fever. °· You have not pooped for 4 days. °· You throw up (vomit). °· You are not hungry. °· You lose weight. °· You are bleeding from the anus. °· You have thin, pencil-like poop (stool). °Get help right away if: °· You have a fever, and your symptoms suddenly get worse. °· You leak poop or have blood in your poop. °· Your belly feels hard or bigger than normal (is bloated). °· You have very bad belly pain. °· You feel dizzy or you faint. °This information is not intended to replace advice given to you by your health care provider. Make sure you discuss any questions you have with your health care provider. °Document Released: 06/11/2007 Document Revised: 07/13/2015 Document Reviewed: 06/13/2015 °Elsevier Interactive Patient Education ©  2017 [employer] Inc[employer] ° °

## 2015-12-11 ENCOUNTER — Other Ambulatory Visit: Payer: Self-pay | Admitting: Family Medicine

## 2015-12-19 DIAGNOSIS — C44622 Squamous cell carcinoma of skin of right upper limb, including shoulder: Secondary | ICD-10-CM | POA: Diagnosis not present

## 2015-12-19 DIAGNOSIS — L570 Actinic keratosis: Secondary | ICD-10-CM | POA: Diagnosis not present

## 2015-12-19 DIAGNOSIS — L814 Other melanin hyperpigmentation: Secondary | ICD-10-CM | POA: Diagnosis not present

## 2015-12-19 DIAGNOSIS — L821 Other seborrheic keratosis: Secondary | ICD-10-CM | POA: Diagnosis not present

## 2015-12-19 DIAGNOSIS — D1801 Hemangioma of skin and subcutaneous tissue: Secondary | ICD-10-CM | POA: Diagnosis not present

## 2015-12-20 DIAGNOSIS — J449 Chronic obstructive pulmonary disease, unspecified: Secondary | ICD-10-CM | POA: Diagnosis not present

## 2015-12-22 DIAGNOSIS — J449 Chronic obstructive pulmonary disease, unspecified: Secondary | ICD-10-CM | POA: Diagnosis not present

## 2015-12-25 ENCOUNTER — Other Ambulatory Visit: Payer: Self-pay | Admitting: Family Medicine

## 2016-01-02 ENCOUNTER — Other Ambulatory Visit: Payer: Self-pay | Admitting: Family Medicine

## 2016-01-04 ENCOUNTER — Other Ambulatory Visit: Payer: Self-pay | Admitting: Internal Medicine

## 2016-01-11 ENCOUNTER — Other Ambulatory Visit: Payer: Self-pay | Admitting: Family Medicine

## 2016-01-17 ENCOUNTER — Telehealth: Payer: Self-pay | Admitting: *Deleted

## 2016-01-17 NOTE — Telephone Encounter (Signed)
Pt requesting appt appt scheduled

## 2016-01-20 DIAGNOSIS — J449 Chronic obstructive pulmonary disease, unspecified: Secondary | ICD-10-CM | POA: Diagnosis not present

## 2016-01-22 ENCOUNTER — Telehealth: Payer: Self-pay | Admitting: Family Medicine

## 2016-01-22 DIAGNOSIS — J449 Chronic obstructive pulmonary disease, unspecified: Secondary | ICD-10-CM | POA: Diagnosis not present

## 2016-01-22 NOTE — Telephone Encounter (Signed)
FYI Per pt's daughter pt is becoming combative and argumentative Daughter wanted to make you aware Wants mother re-evaluated She will not be able to discuss this at [place] due to pt becoming argumentative

## 2016-01-22 NOTE — Telephone Encounter (Signed)
Okay sounds good we will discuss with the patient during the visit what we can do.

## 2016-01-24 ENCOUNTER — Ambulatory Visit: Payer: Medicare Other | Admitting: Family Medicine

## 2016-01-25 ENCOUNTER — Encounter: Payer: Self-pay | Admitting: Family Medicine

## 2016-01-25 ENCOUNTER — Ambulatory Visit (INDEPENDENT_AMBULATORY_CARE_PROVIDER_SITE_OTHER): Payer: Medicare Other | Admitting: Family Medicine

## 2016-01-25 ENCOUNTER — Ambulatory Visit: Payer: Medicare Other | Admitting: Family Medicine

## 2016-01-25 ENCOUNTER — Telehealth: Payer: Self-pay | Admitting: Family Medicine

## 2016-01-25 VITALS — BP 158/68 | HR 73 | Temp 97.4°F | Ht 65.0 in | Wt 166.0 lb

## 2016-01-25 DIAGNOSIS — F329 Major depressive disorder, single episode, unspecified: Secondary | ICD-10-CM

## 2016-01-25 DIAGNOSIS — F418 Other specified anxiety disorders: Secondary | ICD-10-CM | POA: Diagnosis not present

## 2016-01-25 DIAGNOSIS — F419 Anxiety disorder, unspecified: Principal | ICD-10-CM

## 2016-01-25 MED ORDER — QUETIAPINE FUMARATE 100 MG PO TABS: 100.0000 mg | ORAL_TABLET | Freq: Every day | ORAL | 2 refills | Status: DC

## 2016-01-25 NOTE — Assessment & Plan Note (Signed)
Patient has been more agitated and having difficulty sleeping at night and just not doing as well with her husband's loss that happened about 2 years ago. We will add Seroquel the evenings to help her sleep and help control her mood.

## 2016-01-25 NOTE — Progress Notes (Signed)
BP (!) 142/73   Pulse 84   Temp 97.4 F (36.3 C) (Oral)   Ht 5\' 5"  (1.651 m)   Wt 166 lb (75.3 kg)   BMI 27.62 kg/m    Subjective:    Patient ID: Katie Bryant, female    DOB: 03/28/1928, 83 y.o.   MRN: [id number]  HPI: Katie Bryant is a 83 y.o. female presenting on 01/25/2016 for Medication Refill (med ck, old age, )   HPI Anxiety and depression and agitation Patient has been having increased anxiety and depression and agitation is going on for the past few months. She is especially struggling with her husband's loss and not having him there with her and especially since some of her children went away for the holidays and she had a time period where she was alone and not seeing anybody regularly. She just feels sad and agitated and ended up taking to many Med packs and pills over the past few weeks. Her daughter says that she will keep close dry on making sure that she is taking her medications as she is supposed to. She denies any suicidal ideations but does have thoughts of death and would like to be with her husband.  Relevant past medical, surgical, family and social history reviewed and updated as indicated. Interim medical history since our last visit reviewed. Allergies and medications reviewed and updated.  Review of Systems  Constitutional: Negative for chills and fever.  HENT: Negative for congestion, ear discharge and ear pain.   Eyes: Negative for redness and visual disturbance.  Respiratory: Negative for chest tightness and shortness of breath.   Cardiovascular: Negative for chest pain and leg swelling.  Genitourinary: Negative for difficulty urinating and dysuria.  Musculoskeletal: Negative for back pain and gait problem.  Skin: Negative for rash.  Neurological: Negative for light-headedness and headaches.  Psychiatric/Behavioral: Positive for agitation, decreased concentration, dysphoric mood and sleep disturbance. Negative for behavioral problems, self-injury  and suicidal ideas. The patient is nervous/anxious.   All other systems reviewed and are negative.   Per HPI unless specifically indicated above     Objective:    BP (!) 142/73   Pulse 84   Temp 97.4 F (36.3 C) (Oral)   Ht 5\' 5"  (1.651 m)   Wt 166 lb (75.3 kg)   BMI 27.62 kg/m   Wt Readings from Last 3 Encounters:  01/25/16 166 lb (75.3 kg)  12/10/15 170 lb (77.1 kg)  11/15/15 170 lb (77.1 kg)    Physical Exam  Constitutional: She is oriented to person, place, and time. She appears well-developed and well-nourished. No distress.  Eyes: Conjunctivae are normal.  Neck: Neck supple. No thyromegaly present.  Cardiovascular: Normal rate, regular rhythm, normal heart sounds and intact distal pulses.   No murmur heard. Pulmonary/Chest: Effort normal and breath sounds normal. No respiratory distress. She has no wheezes. She has no rales.  Musculoskeletal: Normal range of motion. She exhibits no edema or tenderness.  Neurological: She is alert and oriented to person, place, and time. Coordination normal.  Skin: Skin is warm and dry. No rash noted. She is not diaphoretic.  Psychiatric: Her behavior is normal. Judgment and thought content normal. Her mood appears anxious. She exhibits a depressed mood. She expresses no suicidal ideation. She expresses no suicidal plans.  Nursing note and vitals reviewed.     Assessment & Plan:   Problem List Items Addressed This Visit      Other   Anxiety and depression -  Primary    Patient has been more agitated and having difficulty sleeping at night and just not doing as well with her husband's loss that happened about 2 years ago. We will add Seroquel the evenings to help her sleep and help control her mood.          Follow up plan: Return in about 3 months (around 04/24/2016), or if symptoms worsen or fail to improve, for Recheck anxiety and depression.  Counseling provided for all of the vaccine components No orders of the defined types  were placed in this encounter.   Caryl Pina, MD [facility] 01/25/2016, 4:46 PM

## 2016-01-28 ENCOUNTER — Other Ambulatory Visit: Payer: Self-pay | Admitting: Family Medicine

## 2016-01-29 ENCOUNTER — Ambulatory Visit (INDEPENDENT_AMBULATORY_CARE_PROVIDER_SITE_OTHER): Payer: Medicare Other | Admitting: Family Medicine

## 2016-01-29 ENCOUNTER — Encounter: Payer: Self-pay | Admitting: Family Medicine

## 2016-01-29 VITALS — BP 166/75 | HR 91 | Temp 97.9°F | Ht 65.0 in | Wt 165.8 lb

## 2016-01-29 DIAGNOSIS — G309 Alzheimer's disease, unspecified: Secondary | ICD-10-CM | POA: Diagnosis not present

## 2016-01-29 DIAGNOSIS — F0280 Dementia in other diseases classified elsewhere without behavioral disturbance: Secondary | ICD-10-CM

## 2016-01-29 NOTE — Telephone Encounter (Signed)
Go ahead and call in Refill for the patient.

## 2016-01-29 NOTE — Telephone Encounter (Signed)
I am not sure what she means extended home care but we can certainly make a referral to any agency that provides home health. Eligibility may be an issue however

## 2016-01-29 NOTE — Progress Notes (Signed)
Subjective:    Patient ID: Katie Bryant, female    DOB: 1928/06/09, 83 y.o.   MRN: [id number]  HPI 83 year old female who presents today with questions about her power of attorney. As has been mentioned many times before she feels she can live independently and resents the intrusions of her daughter (ES) in her life. She would like me to in some way do away with the power of attorney and I would rather not become M broiled and a family argument and have asked her to talk to her attorney about legal issues. She was given a atypical antipsychotic earlier this week but does not wish to take it. Due to possibility of complications, I would prefer she take the alprazolam that she had been on before. I have a suspicion that daughters want her to be on this stronger medicine.  Patient Active Problem List   Diagnosis Date Noted  . Anxiety and depression 04/25/2015  . Alzheimer's disease 03/29/2015  . COPD exacerbation ([place]) 01/18/2013  . Lymphadenopathy 10/07/2012  . Chronic respiratory failure with hypercapnia ([place]) 03/11/2011  . HTN (hypertension) 02/12/2011  . Hyperlipidemia 02/12/2011  . Leukocytosis 12/23/2010  . Insomnia 12/22/2010  . Aortic stenosis 07/03/2010  . G E R D 06/06/2007  . Morbid obesity ([place]) 05/25/2007  . COPD GOLD III/ 02 dep  05/25/2007   Outpatient Encounter Prescriptions as of 01/29/2016  Medication Sig  . acetaminophen (TYLENOL) 500 MG tablet Take 500 mg by mouth every 6 (six) hours as needed (pain).   Marland Kitchen ALPRAZolam (XANAX) 1 MG tablet TAKE (1) TABLET TWICE A DAY AS NEEDED FOR ANXIETY OR INSOMNIA.  Marland Kitchen aspirin 81 MG tablet Take 81 mg by mouth daily.  Marland Kitchen atorvastatin (LIPITOR) 40 MG tablet TAKE 1 TABLET DAILY  . budesonide (PULMICORT) 0.5 MG/2ML nebulizer solution NEBULIZE 1 VIAL TWICE A DAY  . buPROPion (WELLBUTRIN SR) 200 MG 12 hr tablet Take 1 tablet by mouth daily.  . chlorpheniramine (CHLOR-TRIMETON) 4 MG tablet Take 8 mg by mouth every 6 (six) hours as needed  (DRIPPY NOSE). Reported on 03/09/2015  . dextromethorphan (DELSYM) 30 MG/5ML liquid Take 2 tsp every 12 hours as needed for cough  . fenofibrate micronized (LOFIBRA) 134 MG capsule TAKE (1) CAPSULE DAILY BEFORE BREAKFAST.  Marland Kitchen guaiFENesin (MUCINEX) 600 MG 12 hr tablet Take 1 every 12 hours as needed with flutter valve for cough and congestion  . ipratropium-albuterol (DUONEB) 0.5-2.5 (3) MG/3ML SOLN NEBULIZE 1 VIAL 4 TIMES A DAY  . levothyroxine (SYNTHROID, LEVOTHROID) 50 MCG tablet TAKE 1 TABLET DAILY  . meclizine (ANTIVERT) 25 MG tablet Take 25 mg by mouth every 8 (eight) hours as needed for dizziness.   . metoprolol tartrate (LOPRESSOR) 25 MG tablet TAKE (1/2) TABLET TWICE DAILY.  . montelukast (SINGULAIR) 10 MG tablet TAKE ONE TABLET AT BEDTIME  . NAMENDA XR 28 MG CP24 24 hr capsule Take 1 capsule (28 mg total) by mouth daily.  . OXYGEN Oxygen 3lpm 24/7, Increase to 4L as needed for shortness of breath with activity  . PROAIR HFA 108 (90 Base) MCG/ACT inhaler INHALE 2 PUFFS BY MOUTH EVERY 6 HOURS AS NEEDED  . QUEtiapine (SEROQUEL) 100 MG tablet Take 1 tablet (100 mg total) by mouth at bedtime.  . ranitidine (ZANTAC) 150 MG tablet TAKE ONE TABLET AT BEDTIME  . Respiratory Therapy Supplies (FLUTTER) DEVI As needed  . sodium chloride (OCEAN) 0.65 % SOLN nasal spray 2 puffs every 4-6 hours as needed for nasal congestion  .  valsartan-hydrochlorothiazide (DIOVAN-HCT) 80-12.5 MG tablet Take 1 tablet by mouth daily.  . Vitamin D, Ergocalciferol, (DRISDOL) 50000 units CAPS capsule Take 1 capsule (50,000 Units total) by mouth every 7 (seven) days.   No facility-administered encounter medications on file as of 01/29/2016.       Review of Systems  Constitutional: Negative.   Respiratory: Positive for shortness of breath.   Cardiovascular: Negative.   Neurological: Negative.   Psychiatric/Behavioral: Positive for confusion.       Objective:   Physical Exam  Constitutional: She appears  well-developed and well-nourished.  Psychiatric:  Spent most of the time during her visit talking. She seems coherent with organized thoughts but does tend to wander off on a tangent if not refocused. I suspect she would do fairly well on a mental status exam and have offered to provide her with results if her attorney needs same.   BP (!) 166/75   Pulse 91   Temp 97.9 F (36.6 C) (Oral)   Ht 5\' 5"  (1.651 m)   Wt 165 lb 12.8 oz (75.2 kg)   BMI 27.59 kg/m        Assessment & Plan:  1. Alzheimer's dementia without behavioral disturbance, unspecified timing of dementia onset She has a label of Alzheimer's disease but in the time that I have known her I have not seen rapid progression as would usually happen with Alzheimer's. I suspect her dementia is more of the vascular type. She is functioning independently for the most part, still cooking, still driving and taking care of activities of daily living.  Wardell Honour MD

## 2016-01-29 NOTE — Telephone Encounter (Signed)
After discussing new meds and a new Chocolate drink at bedtime (?ensure) pt states she feels worse and is sick --- appt made for today with Dr Sabra Heck

## 2016-01-30 NOTE — Telephone Encounter (Signed)
Rx called in 

## 2016-02-04 ENCOUNTER — Other Ambulatory Visit: Payer: Self-pay | Admitting: Family Medicine

## 2016-02-06 ENCOUNTER — Telehealth: Payer: Self-pay

## 2016-02-07 ENCOUNTER — Telehealth: Payer: Self-pay | Admitting: Family Medicine

## 2016-02-07 NOTE — Telephone Encounter (Signed)
Called patient and we discussed that she does not want to take the Seroquel and I am okay with that. We will go ahead and stop the Seroquel from here on out.

## 2016-02-07 NOTE — Telephone Encounter (Signed)
Please advise 

## 2016-02-08 ENCOUNTER — Other Ambulatory Visit: Payer: Self-pay | Admitting: Internal Medicine

## 2016-02-08 NOTE — Telephone Encounter (Signed)
See if she will take you what she wants to talk about. She has been going back and forth between Dr. Wonda Olds. There is possibly some manipulation here and she does have some degree of memory loss

## 2016-02-08 NOTE — Telephone Encounter (Signed)
I spoke to patient on telephone. She is requesting that I asked Dr. Pierce Crane to call her daughter and tell her daughter that he suggested to discontinue the Seroquel due to side effects and go ahead and take the Xanax.

## 2016-02-12 ENCOUNTER — Telehealth: Payer: Self-pay | Admitting: Family Medicine

## 2016-02-12 ENCOUNTER — Other Ambulatory Visit: Payer: Self-pay | Admitting: Family Medicine

## 2016-02-12 NOTE — Telephone Encounter (Signed)
TC to [facility] to inform her. She stated to me that Katie Bryant has been better on the Seroquel, that she abuses the Xanax and she has dementia

## 2016-02-12 NOTE — Telephone Encounter (Signed)
Can we just call her daughter and inform her that she was changed from Seroquel to Xanax

## 2016-02-20 ENCOUNTER — Telehealth: Payer: Self-pay | Admitting: Family Medicine

## 2016-02-20 DIAGNOSIS — J449 Chronic obstructive pulmonary disease, unspecified: Secondary | ICD-10-CM | POA: Diagnosis not present

## 2016-02-22 ENCOUNTER — Other Ambulatory Visit: Payer: Self-pay | Admitting: Family Medicine

## 2016-02-22 DIAGNOSIS — J449 Chronic obstructive pulmonary disease, unspecified: Secondary | ICD-10-CM | POA: Diagnosis not present

## 2016-02-22 NOTE — Telephone Encounter (Signed)
Refill called to [name] 

## 2016-03-03 ENCOUNTER — Other Ambulatory Visit: Payer: Self-pay | Admitting: Family Medicine

## 2016-03-04 ENCOUNTER — Other Ambulatory Visit: Payer: Self-pay | Admitting: Internal Medicine

## 2016-03-05 ENCOUNTER — Telehealth: Payer: Self-pay | Admitting: Family Medicine

## 2016-03-06 ENCOUNTER — Ambulatory Visit (INDEPENDENT_AMBULATORY_CARE_PROVIDER_SITE_OTHER): Payer: Medicare Other | Admitting: Nurse Practitioner

## 2016-03-06 ENCOUNTER — Ambulatory Visit: Payer: Medicare Other | Admitting: Family Medicine

## 2016-03-06 ENCOUNTER — Encounter: Payer: Self-pay | Admitting: Nurse Practitioner

## 2016-03-06 VITALS — BP 154/87 | HR 121 | Temp 98.2°F | Ht 65.0 in | Wt 168.0 lb

## 2016-03-06 DIAGNOSIS — S50811A Abrasion of right forearm, initial encounter: Secondary | ICD-10-CM

## 2016-03-06 DIAGNOSIS — W5503XA Scratched by cat, initial encounter: Secondary | ICD-10-CM

## 2016-03-06 DIAGNOSIS — R200 Anesthesia of skin: Secondary | ICD-10-CM | POA: Diagnosis not present

## 2016-03-06 DIAGNOSIS — Z23 Encounter for immunization: Secondary | ICD-10-CM

## 2016-03-06 DIAGNOSIS — M542 Cervicalgia: Secondary | ICD-10-CM | POA: Diagnosis not present

## 2016-03-06 MED ORDER — AMOXICILLIN-POT CLAVULANATE 875-125 MG PO TABS: 1.0000 | ORAL_TABLET | Freq: Two times a day (BID) | ORAL | 0 refills | Status: DC

## 2016-03-06 NOTE — Progress Notes (Signed)
   Subjective:    Patient ID: Katie Bryant, female    DOB: [birth year], 83 y.o.   MRN: [id number]  HPI  Patient comes in stating that she was playing with her cat 2 nights ago and the cat scratched her right forearm. Since then it has gotten red and sore to touch.   Review of Systems  Constitutional: Negative.   HENT: Negative.   Respiratory: Negative.   Cardiovascular: Negative.   Gastrointestinal: Negative.   Genitourinary: Negative.   Neurological: Negative.   Psychiatric/Behavioral: Negative.   All other systems reviewed and are negative.      Objective:   Physical Exam  Constitutional: She is oriented to person, place, and time. She appears well-developed and well-nourished. No distress.  Cardiovascular: Normal rate and regular rhythm.   Pulmonary/Chest: Effort normal and breath sounds normal.  Neurological: She is alert and oriented to person, place, and time.  Skin: Skin is warm.  2cm scratch to right forearm with distal erythema and edema  Psychiatric: She has a normal mood and affect. Her behavior is normal. Judgment and thought content normal.   BP (!) 154/87   Pulse (!) 121   Temp 98.2 F (36.8 C) (Oral)   Ht 5\' 5"  (1.651 m)   Wt 168 lb (76.2 kg)   BMI 27.96 kg/m        Assessment & Plan:  1. Cat scratch of forearm, right, initial encounter Cool compresses Do ot pick or scratch - amoxicillin-clavulanate (AUGMENTIN) 875-125 MG tablet; Take 1 tablet by mouth 2 (two) times daily.  Dispense: 20 tablet; Refill: 0  Mary-Margaret Hassell Done, FNP

## 2016-03-06 NOTE — Addendum Note (Signed)
Addended by: Jamelle Haring on: 03/06/2016 04:20 PM   Modules accepted: Orders

## 2016-03-06 NOTE — Patient Instructions (Signed)
Cat-Scratch Disease Cat-scratch disease is a rare infection that can be passed to people through the scratch or bite of an infected cat. The infection causes a red bump at the site of the bite or scratch. It may also cause swollen lymph glands and other symptoms. In most cases, the infection is mild and does not cause serious problems. However, a more severe infection can develop in people who have other illnesses or problems that weaken their body's defense system (immune system). What are the causes? This condition is caused by a type of bacteria called Bartonella henselae. These bacteria are present in the mouth or on the claws of cats. What are the signs or symptoms? Common symptoms of this condition include:  A red and sore pimple or bump-with or without pus-on the skin where the cat scratched or bit. The pimple or sore may be present for as long as 3 weeks after the scratch or bite occurred.  One or more enlarged lymph glands located toward the center of the body from where the injury occurred. Other symptoms include:  Low-grade fever.  Tiredness and fatigue.  Headache.  Sore throat. How is this diagnosed? This condition may be diagnosed based on your symptoms and history of a scratch or bite from a cat. Your health care provider will examine the skin sore and look for swollen lymph glands. You may also have tests, such as:  Culture tests of any fluid or pus from the injury site.  Blood tests.  Removal of a tissue sample from a swollen lymph gland (biopsy) to be looked at under a microscope. This may be done to confirm the diagnosis and to make sure that a different infection or disease is not causing your illness. How is this treated? If the condition is mild, treatment may not be needed. You may be advised to take pain medicine and apply heat to the affected area. A more severe infection can be treated with antibiotic medicine. People who have immune system problems will usually  be treated with antibiotics because they are at risk for developing a severe infection. These include people who have HIV or AIDS, people who take medicines that may modify their immune system, and people who have had an organ transplant. Follow these instructions at home:  Rest until you feel better.  Take over-the-counter and prescription medicines only as told by your health care provider.  If you were prescribed an antibiotic medicine, take it as told by your health care provider. Do not stop taking the antibiotic even if you start to feel better.  Keep the area of the cat scratch clean. Wash it with soap and water, or apply an antiseptic solution such as povidone-iodine.  If directed, apply heat to the scratch area:  Put a towel between your skin and a heat pack or heating pad.  Leave the heat on for 20-30 minutes or as told by your health care provider. How is this prevented?  Avoid injury while playing with cats.  Wash well after playing with cats.  Do not let your cat lick sores on your body.  Do not let your cat roam around outside of your house. Contact a health care provider if:  You have increased redness, swelling, or pain at the site of the scratch.  You have fluid, blood, or pus coming from the scratch area.  You have a fever. Get help right away if:  You have increased swelling of your lymph glands.  You develop pain in  your abdomen.  You develop a skin rash.  You feel dizzy or you pass out.  You have a worsening headache.  You develop inflammation of your eye or have increasing vision problems.  You have pain in one of your bones.  You develop a stiff neck. This information is not intended to replace advice given to you by your health care provider. Make sure you discuss any questions you have with your health care provider. Document Released: 12/21/1999 Document Revised: 05/31/2015 Document Reviewed: 03/28/2014 Elsevier Interactive Patient  Education  2017 [employer].
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
## 2016-03-11 ENCOUNTER — Encounter: Payer: Self-pay | Admitting: Family Medicine

## 2016-03-11 ENCOUNTER — Ambulatory Visit (INDEPENDENT_AMBULATORY_CARE_PROVIDER_SITE_OTHER): Payer: Medicare Other | Admitting: Family Medicine

## 2016-03-11 VITALS — BP 163/88 | HR 106 | Temp 97.2°F | Ht 65.0 in | Wt 168.0 lb

## 2016-03-11 DIAGNOSIS — G309 Alzheimer's disease, unspecified: Secondary | ICD-10-CM | POA: Diagnosis not present

## 2016-03-11 DIAGNOSIS — F0280 Dementia in other diseases classified elsewhere without behavioral disturbance: Secondary | ICD-10-CM

## 2016-03-11 DIAGNOSIS — J449 Chronic obstructive pulmonary disease, unspecified: Secondary | ICD-10-CM | POA: Diagnosis not present

## 2016-03-12 ENCOUNTER — Other Ambulatory Visit: Payer: Self-pay | Admitting: Family Medicine

## 2016-03-18 ENCOUNTER — Other Ambulatory Visit: Payer: Self-pay | Admitting: Family Medicine

## 2016-03-18 DIAGNOSIS — H40033 Anatomical narrow angle, bilateral: Secondary | ICD-10-CM | POA: Diagnosis not present

## 2016-03-18 DIAGNOSIS — H2513 Age-related nuclear cataract, bilateral: Secondary | ICD-10-CM | POA: Diagnosis not present

## 2016-03-19 ENCOUNTER — Encounter (INDEPENDENT_AMBULATORY_CARE_PROVIDER_SITE_OTHER): Payer: Medicare Other | Admitting: Ophthalmology

## 2016-03-19 DIAGNOSIS — H4601 Optic papillitis, right eye: Secondary | ICD-10-CM | POA: Diagnosis not present

## 2016-03-19 DIAGNOSIS — H35033 Hypertensive retinopathy, bilateral: Secondary | ICD-10-CM | POA: Diagnosis not present

## 2016-03-19 DIAGNOSIS — I10 Essential (primary) hypertension: Secondary | ICD-10-CM | POA: Diagnosis not present

## 2016-03-19 DIAGNOSIS — J449 Chronic obstructive pulmonary disease, unspecified: Secondary | ICD-10-CM | POA: Diagnosis not present

## 2016-03-19 DIAGNOSIS — H35353 Cystoid macular degeneration, bilateral: Secondary | ICD-10-CM | POA: Diagnosis not present

## 2016-03-19 DIAGNOSIS — H43813 Vitreous degeneration, bilateral: Secondary | ICD-10-CM | POA: Diagnosis not present

## 2016-03-21 DIAGNOSIS — J449 Chronic obstructive pulmonary disease, unspecified: Secondary | ICD-10-CM | POA: Diagnosis not present

## 2016-03-31 DIAGNOSIS — M542 Cervicalgia: Secondary | ICD-10-CM | POA: Diagnosis not present

## 2016-03-31 DIAGNOSIS — R200 Anesthesia of skin: Secondary | ICD-10-CM | POA: Diagnosis not present

## 2016-04-01 ENCOUNTER — Other Ambulatory Visit: Payer: Self-pay | Admitting: Family Medicine

## 2016-04-02 DIAGNOSIS — H4601 Optic papillitis, right eye: Secondary | ICD-10-CM | POA: Diagnosis not present

## 2016-04-02 DIAGNOSIS — M316 Other giant cell arteritis: Secondary | ICD-10-CM | POA: Diagnosis not present

## 2016-04-02 DIAGNOSIS — H3581 Retinal edema: Secondary | ICD-10-CM | POA: Diagnosis not present

## 2016-04-02 DIAGNOSIS — Z961 Presence of intraocular lens: Secondary | ICD-10-CM | POA: Diagnosis not present

## 2016-04-02 DIAGNOSIS — H547 Unspecified visual loss: Secondary | ICD-10-CM | POA: Diagnosis not present

## 2016-04-09 ENCOUNTER — Other Ambulatory Visit: Payer: Self-pay | Admitting: Family Medicine

## 2016-04-14 ENCOUNTER — Other Ambulatory Visit: Payer: Self-pay | Admitting: Family Medicine

## 2016-04-15 ENCOUNTER — Ambulatory Visit: Payer: Self-pay | Admitting: Internal Medicine

## 2016-04-19 DIAGNOSIS — J449 Chronic obstructive pulmonary disease, unspecified: Secondary | ICD-10-CM | POA: Diagnosis not present

## 2016-04-21 DIAGNOSIS — J449 Chronic obstructive pulmonary disease, unspecified: Secondary | ICD-10-CM | POA: Diagnosis not present

## 2016-04-23 ENCOUNTER — Telehealth: Payer: Self-pay | Admitting: Family Medicine

## 2016-04-23 NOTE — Telephone Encounter (Signed)
Patient aware of recommendations and will call back with an appt.
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
## 2016-04-23 NOTE — Telephone Encounter (Signed)
She has had these issues chronically, there is a certain degree of paranoia with her and her daughter is especially worried about her because she has been overusing the Xanax. If there were some true concern than the other option is she could call Adult Protective Services but that would be likely looking towards placement. I think this is more just she has a lot of paranoia about medications. She can always come in for visit we can discuss things that she wants to as well.

## 2016-04-24 ENCOUNTER — Other Ambulatory Visit: Payer: Self-pay | Admitting: Family Medicine

## 2016-04-24 NOTE — Telephone Encounter (Signed)
Refill called to [name] 

## 2016-04-24 NOTE — Telephone Encounter (Signed)
Go ahead and call in this prescription.

## 2016-04-25 ENCOUNTER — Ambulatory Visit (INDEPENDENT_AMBULATORY_CARE_PROVIDER_SITE_OTHER): Payer: Medicare Other | Admitting: Family Medicine

## 2016-04-25 ENCOUNTER — Encounter: Payer: Self-pay | Admitting: Family Medicine

## 2016-04-25 VITALS — BP 159/74 | HR 80 | Temp 97.9°F | Ht 65.0 in | Wt 167.0 lb

## 2016-04-25 DIAGNOSIS — F419 Anxiety disorder, unspecified: Secondary | ICD-10-CM | POA: Diagnosis not present

## 2016-04-25 DIAGNOSIS — K641 Second degree hemorrhoids: Secondary | ICD-10-CM

## 2016-04-25 DIAGNOSIS — F32A Depression, unspecified: Secondary | ICD-10-CM

## 2016-04-25 DIAGNOSIS — F329 Major depressive disorder, single episode, unspecified: Secondary | ICD-10-CM | POA: Diagnosis not present

## 2016-04-25 MED ORDER — HYDROCORTISONE ACETATE 25 MG RE SUPP: 25.0000 mg | Freq: Two times a day (BID) | RECTAL | 0 refills | Status: DC

## 2016-04-25 NOTE — Assessment & Plan Note (Signed)
Coming in for recheck of anxiety and depression, needs refill on the alprazolam, because of her memory issues she was taking them too frequently, putting them in pill packs

## 2016-04-25 NOTE — Progress Notes (Signed)
BP (!) 170/78   Pulse 80   Temp 97.9 F (36.6 C) (Oral)   Ht 5\' 5"  (1.651 m)   Wt 167 lb (75.8 kg)   BMI 27.79 kg/m    Subjective:    Patient ID: Katie Bryant, female    DOB: 1928/05/04, 83 y.o.   MRN: [id number]  HPI: Katie Bryant is a 83 y.o. female presenting on 04/25/2016 for Medication Refill (Alprazolam) and Rectal Bleeding (this morning, history of hemorrhoids)   HPI Anxiety and depression recheck Patient is coming today for anxiety and depression recheck. She needs a refill on her alprazolam. She has been taking alprazolam sometimes too frequently and has run out earlier in the month, the daughter says to resolve that she will put them in the pill packs and see if that will help the situation. She denies any suicidal ideations or thoughts of hurting herself. Patient does have known dementia and has been gradually worsening.  Bleeding hemorrhoids Patient has been having issues with her bleeding hemorrhoids just starting this morning. She does have very large bowel movements and have to push or strain sometimes to get about. She says is been bothering her just this morning and she was having some bleeding and trickling after the bowel movement this morning but she thinks that's resolved now. She just wants to get checked and see what she can do about it. She has not had a colonoscopy in some time because of her age and respiratory status but it is something that they will consider.  Relevant past medical, surgical, family and social history reviewed and updated as indicated. Interim medical history since our last visit reviewed. Allergies and medications reviewed and updated.  Review of Systems  Constitutional: Negative for chills and fever.  Respiratory: Negative for chest tightness and shortness of breath.   Cardiovascular: Negative for chest pain and leg swelling.  Gastrointestinal: Positive for anal bleeding. Negative for abdominal pain, constipation, diarrhea,  nausea, rectal pain and vomiting.  Musculoskeletal: Negative for back pain and gait problem.  Skin: Negative for rash.  Neurological: Negative for light-headedness and headaches.  Psychiatric/Behavioral: Positive for behavioral problems, decreased concentration, dysphoric mood and sleep disturbance. Negative for agitation, self-injury and suicidal ideas. The patient is nervous/anxious.   All other systems reviewed and are negative.   Per HPI unless specifically indicated above        Objective:    BP (!) 170/78   Pulse 80   Temp 97.9 F (36.6 C) (Oral)   Ht 5\' 5"  (1.651 m)   Wt 167 lb (75.8 kg)   BMI 27.79 kg/m   Wt Readings from Last 3 Encounters:  04/25/16 167 lb (75.8 kg)  03/11/16 168 lb (76.2 kg)  03/06/16 168 lb (76.2 kg)    Physical Exam  Constitutional: She is oriented to person, place, and time. She appears well-developed and well-nourished. No distress.  Eyes: Conjunctivae are normal.  Cardiovascular: Normal rate, regular rhythm, normal heart sounds and intact distal pulses.   No murmur heard. Pulmonary/Chest: Effort normal and breath sounds normal. No respiratory distress. She has no wheezes.  Abdominal: Soft. Bowel sounds are normal. She exhibits no distension. There is no tenderness. There is no rebound and no guarding.  Genitourinary: Rectal exam shows external hemorrhoid and internal hemorrhoid. Rectal exam shows no fissure and no tenderness.  Musculoskeletal: Normal range of motion. She exhibits no edema or tenderness.  Neurological: She is alert and oriented to person, place, and time. Coordination normal.  Skin: Skin is warm and dry. No rash noted. She is not diaphoretic.  Psychiatric: Her behavior is normal. Her mood appears anxious. Cognition and memory are impaired. She expresses impulsivity. She exhibits a depressed mood. She expresses no suicidal ideation. She expresses no suicidal plans.  Nursing note and vitals reviewed.     Assessment & Plan:    Problem List Items Addressed This Visit      Other   Anxiety and depression - Primary    Coming in for recheck of anxiety and depression, needs refill on the alprazolam, because of her memory issues she was taking them too frequently, putting them in pill packs       Other Visit Diagnoses    Grade II hemorrhoids       Relevant Medications   hydrocortisone (ANUSOL-HC) 25 MG suppository       Follow up plan: Return if symptoms worsen or fail to improve.  Counseling provided for all of the vaccine components No orders of the defined types were placed in this encounter.   Caryl Pina, MD [facility] 04/25/2016, 10:10 AM

## 2016-04-28 ENCOUNTER — Encounter: Payer: Self-pay | Admitting: Internal Medicine

## 2016-04-28 ENCOUNTER — Ambulatory Visit (INDEPENDENT_AMBULATORY_CARE_PROVIDER_SITE_OTHER): Payer: Medicare Other | Admitting: Internal Medicine

## 2016-04-28 VITALS — BP 126/72 | HR 80 | Ht 65.0 in | Wt 167.0 lb

## 2016-04-28 DIAGNOSIS — J9612 Chronic respiratory failure with hypercapnia: Secondary | ICD-10-CM | POA: Diagnosis not present

## 2016-04-28 DIAGNOSIS — J449 Chronic obstructive pulmonary disease, unspecified: Secondary | ICD-10-CM

## 2016-04-28 NOTE — Assessment & Plan Note (Signed)
-   sats 90% RA 03/11/2011     - Dr Annamaria Boots started 02 around 2010    - 3 lpm 24/7 except 4lpm with activity as of 03/02/2013     - HC03 trending in low 30's since 2012 so likely hypercarbic component as well   - 02/24/2014   Walked RA x one lap @ 185 stopped due to  desat to 83% corrected on 4lpm    - 12/05/2014   Walked 3lpm pulsed x 2 laps @ 185 ft each stopped due to sob with sat 89% at moderate pace    - 03/06/2015   Walked 4lpm 2 laps @ 185 ft each stopped due to  Sob/ desat to 86% at pace faster than her nl      rx as of 04/28/2016  = 3lpm 24/7 except 4lpm when walking more than room to room

## 2016-04-28 NOTE — Progress Notes (Signed)
Subjective:     Patient ID: Katie Bryant, female   DOB: [birth year]    MRN: [id number]   Brief patient profile:  21 yowf quit smoking 1992 with GOLD III copd dx 2009 eval in pulm clinic p hospitalized at [facility] 12/21/10   History of Present Illness  09/27/2013 Follow up and Med review  Hx of COPD III/ 02 dep/ has med  Patient returns for followup and medication review. We reviewed all her medications and organized them into a medication calendar with patient education. Does get easily confused with meds. Appears family is helping with meds now.  Recently dx with dementia, now on Namenda. Seems to be some improvement with memory.  rec No change rx/ follow med calendar and bring to each visit/ daughter [name]    03/06/2015  f/u ov/Marijayne Rauth re: copd gold III / 02 dep 3lpm x 4 with activity   Chief Complaint  Patient presents with  . Follow-up    Breathing overall doing well. She is using albuterol inhaler almost every day.   doe on 02 = MMRC2 = can't walk a nl pace on a flat grade s sob - does ok at slow pace, flat grade at 4lpm   >>no changes    06/05/2015 Follow up : COPD  Pt returns for 3 month follow up .  Says overall she is doing well. Here with her daughter We reviewed all her meds and organized them into a med calendar with pt education.  She gets meds done at [facility] . They package her pills up in divided sections with 4 time frames and give her weekly packs. Family calls  Pharmacy if there is changes and pharmacy verifies changes with MD  and adjusts packages. Meds are delivered , they pay $15 month for this service.  PVX and Prevnar are utd.  No longer getting nebs thru [facility] , now thru pharmacy bc [place] contract with her insurance. Not sure if filed on Medicare B at pharmacy.  rec Check with pharmacy to see if nebs if filed on Medicare B (and not Medicare D ) .  Follow med calendar closely and bring to each visit.  Continue on current regimen  Follow up Dr. [name]  In 4  months and As needed      10/16/2015  f/u ov/Khaleel Beckom re:  Copd 3lpm increase to 4lpm with exertion bud/ duoneb  Chief Complaint  Patient presents with  . Follow-up    Breathing is doing well overall. She uses albuterol 2 x daily on average.   doe = MMRC2 = can't walk a nl pace on a flat grade s sob but does fine slow and flat eg [employer] shopping on 4lpm  rec No change/ follow the med cal   04/28/2016  f/u ov/Owain Eckerman re:   COPD III/ 3lpm  Plus duoneb qid and pulmicort bid neb  Chief Complaint  Patient presents with  . Follow-up    Breathing is unchanged.   continues doing some shopping on 02 leaning on cart at [facility] = MMRC2   No need for saba rescue  No obvious day to day or daytime variability or assoc excess/ purulent sputum or mucus plugs or hemoptysis or cp or chest tightness, subjective wheeze or overt sinus or hb symptoms. No unusual exp hx or h/o childhood pna/ asthma or knowledge of premature birth.  Sleeping ok without nocturnal  or early am exacerbation  of respiratory  c/o's or need for noct saba. Also denies any obvious  fluctuation of symptoms with weather or environmental changes or other aggravating or alleviating factors except as outlined above   Current Medications, Allergies, Complete Past Medical History, Past Surgical History, Family History, and Social History were reviewed in [employer] record.  ROS  The following are not active complaints unless bolded sore throat, dysphagia, dental problems, itching, sneezing,  nasal congestion or excess/ purulent secretions, ear ache,   fever, chills, sweats, unintended wt loss, classically pleuritic or exertional cp,  orthopnea pnd or leg swelling, presyncope, palpitations, abdominal pain, anorexia, nausea, vomiting, diarrhea  or change in bowel or bladder habits, change in stools or urine, dysuria,hematuria,  rash, arthralgias, visual complaints, headache, numbness, weakness or ataxia or problems with walking or  coordination,  change in mood/affect or memory.                          Objective:  Physical Exam  Stoic  amb wf nad    Vital signs reviewed - Note on arrival 02 sats  93% on 3lpm POC pulsing       Wt 184 01/21/2011  >08/27/2011 186 > 180 09/29/2011 > 10/07/2011  183 > 11/11/2011  181 > 01/13/2012  183 >180 02/12/2012 > 06/09/2012 188 > 189 10/12/2012 >188 01/14/2013 > 01/21/2013 187 > 03/02/2013 >184 03/18/2013 >  03/28/2013 186> 06/06/2013  182 >  07/18/2013  179 >181 09/27/2013 > 12/08/2013   183 > 02/24/2014  178 > 06/09/2014 177 > 09/07/2014 175 > 12/05/2014   173 > 03/06/2015 173>  10/16/2015   171 >  04/28/2016  167   HEENT: nl dentition, turbinates bilaterally, and oropharynx. Nl external ear canals without cough reflex   NECK :  without JVD/Nodes/TM/ nl carotid upstrokes bilaterally   LUNGS: no acc muscle use,  Barrel chest / distant bs s audible wheeze/ mild/ mod prolongation Texp  CV:  RRR  no s3 or murmur or increase in P2, and no edema   ABD:  soft and nontender with nl inspiratory excursion in the supine position. No bruits or organomegaly appreciated, bowel sounds nl  MS:  Nl gait/ ext warm without deformities, calf tenderness, cyanosis or clubbing No obvious joint restrictions   SKIN: warm and dry without lesions    NEURO:  alert, approp, nl sensorium with  no motor or cerebellar deficits apparent.

## 2016-04-28 NOTE — Patient Instructions (Signed)
No change in medications   Work on inhaler technique:  relax and gently blow all the way out then take a nice smooth deep breath back in, triggering the inhaler at same time you start breathing in.  Hold for up to 5 seconds if you can. Blow out thru nose. Rinse and gargle with water when done    Only use your salbutin  as a rescue medication to be used if you can't catch your breath by resting or doing a relaxed purse lip breathing pattern.  - The less you use it, the better it will work when you need it. - Ok to use up to 2 puffs  every 4 hours if you must but call for immediate appointment if use goes up over your usual need - Don't leave home without it !!  (think of it like the spare tire for your car)   Please schedule a follow up office visit in 6 months, call sooner if needed with all medications /inhalers/ solutions in hand so we can verify exactly what you are taking. This includes all medications from all doctors and over the counters

## 2016-04-28 NOTE — Assessment & Plan Note (Addendum)
-   PFT's 06/11/2007  FEV1   0.70 (34%) ratio 32 with 49% improvement p B2    - HFA 75% effective p coaching 06/20/2011 > 50% 06/24/2011  -Med calendar 09/27/2013 > did not bring as requested 06/09/14 , 06/05/2015 redone  - improved reconciliation 09/07/2014 and 03/07/2015 and 10/16/2015   - 04/28/2016  After extensive coaching HFA effectiveness =    50% from a baseline of 25%   She has finally turned the corner and stabilized on complex rx reflected on med calendar with action plan at bottom     Each maintenance medication was reviewed in detail including most importantly the difference between maintenance and as needed and under what circumstances the prns are to be used. This was done in the context of a medication calendar review which provided the patient with a user-friendly unambiguous mechanism for medication administration and reconciliation and provides an action plan for all active problems. It is critical that this be shown to every doctor  for modification during the office visit if necessary so the patient can use it as a working document.

## 2016-04-30 DIAGNOSIS — H40059 Ocular hypertension, unspecified eye: Secondary | ICD-10-CM | POA: Diagnosis not present

## 2016-04-30 DIAGNOSIS — H3581 Retinal edema: Secondary | ICD-10-CM | POA: Diagnosis not present

## 2016-04-30 DIAGNOSIS — H21302 Idiopathic cysts of iris, ciliary body or anterior chamber, left eye: Secondary | ICD-10-CM | POA: Diagnosis not present

## 2016-04-30 DIAGNOSIS — H59033 Cystoid macular edema following cataract surgery, bilateral: Secondary | ICD-10-CM | POA: Diagnosis not present

## 2016-04-30 DIAGNOSIS — H1712 Central corneal opacity, left eye: Secondary | ICD-10-CM | POA: Diagnosis not present

## 2016-04-30 DIAGNOSIS — H547 Unspecified visual loss: Secondary | ICD-10-CM | POA: Diagnosis not present

## 2016-05-01 ENCOUNTER — Encounter (INDEPENDENT_AMBULATORY_CARE_PROVIDER_SITE_OTHER): Payer: Medicare Other | Admitting: Ophthalmology

## 2016-05-01 ENCOUNTER — Telehealth: Payer: Self-pay

## 2016-05-01 DIAGNOSIS — H43813 Vitreous degeneration, bilateral: Secondary | ICD-10-CM

## 2016-05-01 DIAGNOSIS — H35033 Hypertensive retinopathy, bilateral: Secondary | ICD-10-CM | POA: Diagnosis not present

## 2016-05-01 DIAGNOSIS — I10 Essential (primary) hypertension: Secondary | ICD-10-CM

## 2016-05-01 DIAGNOSIS — H4601 Optic papillitis, right eye: Secondary | ICD-10-CM | POA: Diagnosis not present

## 2016-05-01 DIAGNOSIS — H59033 Cystoid macular edema following cataract surgery, bilateral: Secondary | ICD-10-CM | POA: Diagnosis not present

## 2016-05-05 DIAGNOSIS — G5602 Carpal tunnel syndrome, left upper limb: Secondary | ICD-10-CM | POA: Diagnosis not present

## 2016-05-05 NOTE — Telephone Encounter (Signed)
x

## 2016-05-09 ENCOUNTER — Other Ambulatory Visit: Payer: Self-pay | Admitting: Family

## 2016-05-13 NOTE — Telephone Encounter (Signed)
Patient aware and verbalizes understanding. 

## 2016-05-16 DIAGNOSIS — H578 Other specified disorders of eye and adnexa: Secondary | ICD-10-CM | POA: Diagnosis not present

## 2016-05-19 DIAGNOSIS — J449 Chronic obstructive pulmonary disease, unspecified: Secondary | ICD-10-CM | POA: Diagnosis not present

## 2016-05-21 DIAGNOSIS — J449 Chronic obstructive pulmonary disease, unspecified: Secondary | ICD-10-CM | POA: Diagnosis not present

## 2016-05-26 ENCOUNTER — Other Ambulatory Visit: Payer: Self-pay | Admitting: Family Medicine

## 2016-06-05 ENCOUNTER — Encounter: Payer: Self-pay | Admitting: Internal Medicine

## 2016-06-05 ENCOUNTER — Ambulatory Visit (INDEPENDENT_AMBULATORY_CARE_PROVIDER_SITE_OTHER): Payer: Medicare Other | Admitting: Internal Medicine

## 2016-06-05 ENCOUNTER — Ambulatory Visit: Payer: Self-pay | Admitting: Internal Medicine

## 2016-06-05 VITALS — BP 122/60 | HR 71 | Ht 65.0 in | Wt 168.0 lb

## 2016-06-05 DIAGNOSIS — J449 Chronic obstructive pulmonary disease, unspecified: Secondary | ICD-10-CM

## 2016-06-05 DIAGNOSIS — F329 Major depressive disorder, single episode, unspecified: Secondary | ICD-10-CM

## 2016-06-05 DIAGNOSIS — F419 Anxiety disorder, unspecified: Secondary | ICD-10-CM

## 2016-06-05 DIAGNOSIS — J9612 Chronic respiratory failure with hypercapnia: Secondary | ICD-10-CM | POA: Diagnosis not present

## 2016-06-05 NOTE — Patient Instructions (Signed)
Target for 02 saturation is 90% or better   See calendar for specific medication instructions and bring it back for each and every office visit for every healthcare provider you see.  Without it,  you may not receive the best quality medical care that we feel you deserve.  You will note that the calendar groups together  your maintenance  medications that are timed at particular times of the day.  Think of this as your checklist for what your doctor has instructed you to do until your next evaluation to see what benefit  there is  to staying on a consistent group of medications intended to keep you well.  The other group at the bottom is entirely up to you to use as you see fit  for specific symptoms that may arise between visits that require you to treat them on an as needed basis.  Think of this as your action plan or "what if" list.   Separating the top medications from the bottom group is fundamental to providing you adequate care going forward.

## 2016-06-05 NOTE — Progress Notes (Signed)
Subjective:     Patient ID: Katie Bryant, female   DOB: April 24, 1928    MRN: [id number]   Brief patient profile:  80 yowf quit smoking 1992 with GOLD III copd dx 2009 eval in pulm clinic p hospitalized at [facility] 12/21/10   History of Present Illness  09/27/2013 Follow up and Med review  Hx of COPD III/ 02 dep/ has med  Patient returns for followup and medication review. We reviewed all her medications and organized them into a medication calendar with patient education. Does get easily confused with meds. Appears family is helping with meds now.  Recently dx with dementia, now on Namenda. Seems to be some improvement with memory.  rec No change rx/ follow med calendar and bring to each visit/ daughter [name]    03/06/2015  f/u ov/Tashiana Lamarca re: copd gold III / 02 dep 3lpm x 4 with activity   Chief Complaint  Patient presents with  . Follow-up    Breathing overall doing well. She is using albuterol inhaler almost every day.   doe on 02 = MMRC2 = can't walk a nl pace on a flat grade s sob - does ok at slow pace, flat grade at 4lpm   >>no changes    06/05/2015 Follow up : COPD  Pt returns for 3 month follow up .  Says overall she is doing well. Here with her daughter We reviewed all her meds and organized them into a med calendar with pt education.  She gets meds done at [facility] . They package her pills up in divided sections with 4 time frames and give her weekly packs. Family calls  Pharmacy if there is changes and pharmacy verifies changes with MD  and adjusts packages. Meds are delivered , they pay $15 month for this service.  PVX and Prevnar are utd.  No longer getting nebs thru [facility] , now thru pharmacy bc [place] contract with her insurance. Not sure if filed on Medicare B at pharmacy.  rec Check with pharmacy to see if nebs if filed on Medicare B (and not Medicare D ) .  Follow med calendar closely and bring to each visit.  Continue on current regimen  Follow up Dr. [name]  In 4  months and As needed      10/16/2015  f/u ov/George Alcantar re:  Copd 3lpm increase to 4lpm with exertion bud/ duoneb  Chief Complaint  Patient presents with  . Follow-up    Breathing is doing well overall. She uses albuterol 2 x daily on average.   doe = MMRC2 = can't walk a nl pace on a flat grade s sob but does fine slow and flat eg [employer] shopping on 4lpm  rec No change/ follow the med cal   04/28/2016  f/u ov/Esaias Cleavenger re:   COPD III/ 3lpm  Plus duoneb qid and pulmicort bid neb  Chief Complaint  Patient presents with  . Follow-up    Breathing is unchanged.   continues doing some shopping on 02 leaning on cart at [facility] = MMRC2  No need for saba rescue rec No change in medications  Work on inhaler technique:  Only use your salbutin  as a rescue medication   06/05/2016 acute extended ov/Aahan Marques re: copd GOLD III/ 3lpm plus pulm bid and duoneb qid  Chief Complaint  Patient presents with  . Acute Visit    Pt states that she has been waking up with HA and SOB for the past 2 wks. She states she  has also been more SOB with exertion.    main symptom is feels funny in head (points to crown) x sev weeks, not worse in am / no assoc nasal congestion or purulent secretions  Very confused re details of care and constantly arguing with daughter about her capacity to take care of herself Not using meds from action plan portion of med calendar  No obvious day to day or daytime variability or assoc excess/ purulent sputum or mucus plugs or hemoptysis or cp or chest tightness, subjective wheeze or overt sinus or hb symptoms. No unusual exp hx or h/o childhood pna/ asthma or knowledge of premature birth.  Sleeping ok without nocturnal  or early am exacerbation  of respiratory  c/o's or need for noct saba. Also denies any obvious fluctuation of symptoms with weather or environmental changes or other aggravating or alleviating factors except as outlined above   Current Medications, Allergies, Complete Past Medical  History, Past Surgical History, Family History, and Social History were reviewed in [employer] record.  ROS  The following are not active complaints unless bolded sore throat, dysphagia, dental problems, itching, sneezing,  nasal congestion or excess/ purulent secretions, ear ache,   fever, chills, sweats, unintended wt loss, classically pleuritic or exertional cp,  orthopnea pnd or leg swelling, presyncope, palpitations, abdominal pain, anorexia, nausea, vomiting, diarrhea  or change in bowel or bladder habits, change in stools or urine, dysuria,hematuria,  rash, arthralgias, visual complaints, headache, numbness, weakness or ataxia or problems with walking or coordination,  change in mood/affect or memory.                   Objective:  Physical Exam  Chronically ill animated  amb wf nad    Vital signs reviewed - Note on arrival 02 sats  93% on 3lpm POC pulsing       Wt 184 01/21/2011  >08/27/2011 186 > 180 09/29/2011 > 10/07/2011  183 > 11/11/2011  181 > 01/13/2012  183 >180 02/12/2012 > 06/09/2012 188 > 189 10/12/2012 >188 01/14/2013 > 01/21/2013 187 > 03/02/2013 >184 03/18/2013 >  03/28/2013 186> 06/06/2013  182 >  07/18/2013  179 >181 09/27/2013 > 12/08/2013   183 > 02/24/2014  178 > 06/09/2014 177 > 09/07/2014 175 > 12/05/2014   173 > 03/06/2015 173>  10/16/2015   171 >  04/28/2016  167   HEENT: nl dentition, turbinates bilaterally, and oropharynx. Nl external ear canals without cough reflex   NECK :  without JVD/Nodes/TM/ nl carotid upstrokes bilaterally   LUNGS: no acc muscle use,   Minimal insp and exp rhonchi bilaterally   CV:  RRR  no s3 or murmur or increase in P2, and no edema   ABD:  soft and nontender with nl inspiratory excursion in the supine position. No bruits or organomegaly appreciated, bowel sounds nl  MS:  Nl gait/ ext warm without deformities, calf tenderness, cyanosis or clubbing No obvious joint restrictions   SKIN: warm and dry without lesions    NEURO:   alert, approp, nl sensorium with  no motor or cerebellar deficits apparent.

## 2016-06-09 ENCOUNTER — Other Ambulatory Visit: Payer: Self-pay | Admitting: Family Medicine

## 2016-06-09 NOTE — Assessment & Plan Note (Signed)
-   sats 90% RA 03/11/2011     - Dr Annamaria Boots started 02 around 2010    - 3 lpm 24/7 except 4lpm with activity as of 03/02/2013     - HC03 trending in low 30's since 2012 so likely hypercarbic component as well   - 02/24/2014   Walked RA x one lap @ 185 stopped due to  desat to 83% corrected on 4lpm    - 12/05/2014   Walked 3lpm pulsed x 2 laps @ 185 ft each stopped due to sob with sat 89% at moderate pace    - 03/06/2015   Walked 4lpm 2 laps @ 185 ft each stopped due to  Sob/ desat to 86% at pace faster than her nl      rx as of 06/05/2016  = 3lpm 24/7 except 4lpm when walking more than room to room   No evidence that the am ha has anything to do with noct desat or hypercarbia so no change in rx

## 2016-06-09 NOTE — Assessment & Plan Note (Signed)
I suspect this is the cause for her non-specific ha >  Follow up per Primary Care planned

## 2016-06-09 NOTE — Assessment & Plan Note (Signed)
-   PFT's 06/11/2007  FEV1   0.70 (34%) ratio 32 with 49% improvement p B2    - HFA 75% effective p coaching 06/20/2011 > 50% 06/24/2011  -Med calendar 09/27/2013 > did not bring as requested 06/09/14 , 06/05/2015 redone  - improved reconciliation 09/07/2014 and 03/07/2015 and 10/16/2015   - 06/05/2016  After extensive coaching HFA effectiveness =    50% from a baseline of 25%   Still not able to use hfa effectively so best to leave well enough alone and just use neb     Each maintenance medication was reviewed in detail including most importantly the difference between maintenance and as needed and under what circumstances the prns are to be used. This was done in the context of a medication calendar review which provided the patient with a user-friendly unambiguous mechanism for medication administration and reconciliation and provides an action plan for all active problems. It is critical that this be shown to every doctor  for modification during the office visit if necessary so the patient can use it as a working document.

## 2016-06-10 ENCOUNTER — Other Ambulatory Visit: Payer: Self-pay | Admitting: Cardiology

## 2016-06-10 NOTE — Telephone Encounter (Signed)
Last vit D 03/2015.

## 2016-06-12 ENCOUNTER — Encounter (INDEPENDENT_AMBULATORY_CARE_PROVIDER_SITE_OTHER): Payer: Medicare Other | Admitting: Ophthalmology

## 2016-06-12 DIAGNOSIS — H35033 Hypertensive retinopathy, bilateral: Secondary | ICD-10-CM

## 2016-06-12 DIAGNOSIS — H4601 Optic papillitis, right eye: Secondary | ICD-10-CM | POA: Diagnosis not present

## 2016-06-12 DIAGNOSIS — I10 Essential (primary) hypertension: Secondary | ICD-10-CM

## 2016-06-12 DIAGNOSIS — H59033 Cystoid macular edema following cataract surgery, bilateral: Secondary | ICD-10-CM | POA: Diagnosis not present

## 2016-06-12 DIAGNOSIS — H43813 Vitreous degeneration, bilateral: Secondary | ICD-10-CM

## 2016-06-17 ENCOUNTER — Other Ambulatory Visit: Payer: Self-pay | Admitting: Family Medicine

## 2016-06-19 DIAGNOSIS — J449 Chronic obstructive pulmonary disease, unspecified: Secondary | ICD-10-CM | POA: Diagnosis not present

## 2016-06-21 DIAGNOSIS — J449 Chronic obstructive pulmonary disease, unspecified: Secondary | ICD-10-CM | POA: Diagnosis not present

## 2016-06-22 DIAGNOSIS — R399 Unspecified symptoms and signs involving the genitourinary system: Secondary | ICD-10-CM | POA: Diagnosis not present

## 2016-06-22 DIAGNOSIS — R0602 Shortness of breath: Secondary | ICD-10-CM | POA: Diagnosis not present

## 2016-06-24 ENCOUNTER — Ambulatory Visit: Payer: Medicare Other | Admitting: Family Medicine

## 2016-06-25 ENCOUNTER — Encounter: Payer: Self-pay | Admitting: Family Medicine

## 2016-06-25 ENCOUNTER — Ambulatory Visit (INDEPENDENT_AMBULATORY_CARE_PROVIDER_SITE_OTHER): Payer: Medicare Other | Admitting: Family Medicine

## 2016-06-25 VITALS — BP 132/66 | HR 81 | Temp 97.9°F | Ht 65.0 in | Wt 169.0 lb

## 2016-06-25 DIAGNOSIS — F0281 Dementia in other diseases classified elsewhere with behavioral disturbance: Secondary | ICD-10-CM | POA: Diagnosis not present

## 2016-06-25 DIAGNOSIS — J9612 Chronic respiratory failure with hypercapnia: Secondary | ICD-10-CM | POA: Diagnosis not present

## 2016-06-25 DIAGNOSIS — G301 Alzheimer's disease with late onset: Secondary | ICD-10-CM

## 2016-06-25 DIAGNOSIS — I10 Essential (primary) hypertension: Secondary | ICD-10-CM | POA: Diagnosis not present

## 2016-06-25 DIAGNOSIS — F02818 Dementia in other diseases classified elsewhere, unspecified severity, with other behavioral disturbance: Secondary | ICD-10-CM

## 2016-06-25 DIAGNOSIS — F329 Major depressive disorder, single episode, unspecified: Secondary | ICD-10-CM

## 2016-06-25 DIAGNOSIS — E039 Hypothyroidism, unspecified: Secondary | ICD-10-CM

## 2016-06-25 DIAGNOSIS — F419 Anxiety disorder, unspecified: Secondary | ICD-10-CM | POA: Diagnosis not present

## 2016-06-25 HISTORY — DX: Hypothyroidism, unspecified: E03.9

## 2016-06-25 MED ORDER — QUETIAPINE FUMARATE 50 MG PO TABS: 100.0000 mg | ORAL_TABLET | Freq: Every day | ORAL | 1 refills | Status: DC

## 2016-06-25 NOTE — Progress Notes (Signed)
BP 132/66   Pulse 81   Temp 97.9 F (36.6 C) (Oral)   Ht 5' 5"  (1.651 m)   Wt 169 lb (76.7 kg)   BMI 28.12 kg/m    Subjective:    Patient ID: Katie Bryant, female    DOB: [birth year], 83 y.o.   MRN: [id number]  HPI: Katie Bryant is a 83 y.o. female presenting on 06/25/2016 for Anxiety and memory issues   HPI Anxiety and depression Patient is coming in because she's been having increased anxiety that has been worsening because her breathing is worsening. Her breathing is especially bad at night when she tries to lay down and it wakes her up multiple times a night. She is artery gone to discuss this with Dr. Marlene Lard her pulmonologist and he says that there is not much else they can do at this point. She is still smoking and has no desires to quit. She denies any suicidal ideations or thoughts of hurting herself.  Worsening Alzheimer's and memory Patient is currently on Namzaric and still having increased memory issues and she is constantly forgetting appointments and forgetting recent medications in and her daughter is trying to help manage a lot of this but she is having a lot of issues with this. She denies having significant memory issues herself but it is something that has been worsening and progressing over the past year. We think that it is been worsening because of her chronic hypoxia.  Chronic respiratory issues Patient has been having chronic respiratory issues and COPD for quite some time and has pulmonologist that she seen for this but it has been worsening to where she is frequently short of breath at night despite being on oxygen. She denies any increased and wheezing or coughing more than her baseline but she just always has wheezing and coughing to some extent. She is using all of her inhalers like she is supposed to.  Hypothyroid and hypertension recheck Patient is coming to recheck both thyroid and blood pressure and says that she's been having more cold issues  recently per her daughter but denies any other issues with her medications and would like to have those checked.  Relevant past medical, surgical, family and social history reviewed and updated as indicated. Interim medical history since our last visit reviewed. Allergies and medications reviewed and updated.  Review of Systems  Constitutional: Negative for chills and fever.  HENT: Negative for congestion, ear discharge and ear pain.   Eyes: Negative for redness and visual disturbance.  Respiratory: Positive for cough, shortness of breath and wheezing. Negative for chest tightness.   Cardiovascular: Negative for chest pain and leg swelling.  Endocrine: Positive for cold intolerance.  Genitourinary: Negative for difficulty urinating and dysuria.  Musculoskeletal: Negative for back pain and gait problem.  Skin: Negative for rash.  Neurological: Negative for dizziness, light-headedness and headaches.  Psychiatric/Behavioral: Positive for confusion, dysphoric mood and sleep disturbance. Negative for agitation, behavioral problems, self-injury and suicidal ideas. The patient is nervous/anxious.   All other systems reviewed and are negative.   Per HPI unless specifically indicated above        Objective:    BP 132/66   Pulse 81   Temp 97.9 F (36.6 C) (Oral)   Ht 5' 5"  (1.651 m)   Wt 169 lb (76.7 kg)   BMI 28.12 kg/m   Wt Readings from Last 3 Encounters:  06/25/16 169 lb (76.7 kg)  06/05/16 168 lb (76.2 kg)  04/28/16 167  lb (75.8 kg)    Physical Exam  Constitutional: She is oriented to person, place, and time. She appears well-developed and well-nourished. No distress.  Eyes: Conjunctivae are normal.  Cardiovascular: Normal rate, regular rhythm, normal heart sounds and intact distal pulses.   No murmur heard. Pulmonary/Chest: Effort normal. No respiratory distress. She has decreased breath sounds in the right middle field, the right lower field, the left middle field and the  left lower field. She has no wheezes. She has no rhonchi. She has no rales.  Musculoskeletal: Normal range of motion. She exhibits no edema or tenderness.  Neurological: She is alert and oriented to person, place, and time. Coordination normal.  Skin: Skin is warm and dry. No rash noted. She is not diaphoretic.  Psychiatric: She has a normal mood and affect. Her behavior is normal.  Nursing note and vitals reviewed.       Assessment & Plan:   Problem List Items Addressed This Visit      Cardiovascular and Mediastinum   HTN (hypertension)   Relevant Orders   CMP14+EGFR     Respiratory   Chronic respiratory failure with hypercapnia ([place])   Relevant Orders   CBC with Differential/Platelet     Endocrine   Hypothyroidism   Relevant Orders   TSH     Nervous and Auditory   Alzheimer's disease   Relevant Medications   QUEtiapine (SEROQUEL) 50 MG tablet     Other   Anxiety and depression - Primary   Relevant Medications   QUEtiapine (SEROQUEL) 50 MG tablet   Other Relevant Orders   CBC with Differential/Platelet   TSH       Follow up plan: Return in about 3 months (around 09/25/2016), or if symptoms worsen or fail to improve, for Recheck anxiety and thyroid.  Counseling provided for all of the vaccine components Orders Placed This Encounter  Procedures  . CBC with Differential/Platelet  . CMP14+EGFR  . TSH    Caryl Pina, MD [facility] 06/25/2016, 4:01 PM

## 2016-06-26 LAB — CBC WITH DIFFERENTIAL/PLATELET
BASOS: 0 %
Basophils Absolute: 0 10*3/uL (ref 0.0–0.2)
EOS (ABSOLUTE): 0.1 10*3/uL (ref 0.0–0.4)
EOS: 1 %
HEMATOCRIT: 37 % (ref 34.0–46.6)
HEMOGLOBIN: 12.2 g/dL (ref 11.1–15.9)
Immature Grans (Abs): 0.1 10*3/uL (ref 0.0–0.1)
Immature Granulocytes: 1 %
LYMPHS ABS: 2.1 10*3/uL (ref 0.7–3.1)
Lymphs: 20 %
MCH: 29.8 pg (ref 26.6–33.0)
MCHC: 33 g/dL (ref 31.5–35.7)
MCV: 91 fL (ref 79–97)
MONOCYTES: 11 %
MONOS ABS: 1.2 10*3/uL — AB (ref 0.1–0.9)
NEUTROS ABS: 7.4 10*3/uL — AB (ref 1.4–7.0)
Neutrophils: 67 %
Platelets: 263 10*3/uL (ref 150–379)
RBC: 4.09 x10E6/uL (ref 3.77–5.28)
RDW: 13.3 % (ref 12.3–15.4)
WBC: 10.9 10*3/uL — ABNORMAL HIGH (ref 3.4–10.8)

## 2016-06-26 LAB — CMP14+EGFR
A/G RATIO: 1.7 (ref 1.2–2.2)
ALBUMIN: 4.3 g/dL (ref 3.5–4.7)
ALT: 18 IU/L (ref 0–32)
AST: 27 IU/L (ref 0–40)
Alkaline Phosphatase: 56 IU/L (ref 39–117)
BUN / CREAT RATIO: 22 (ref 12–28)
BUN: 17 mg/dL (ref 8–27)
Bilirubin Total: 0.2 mg/dL (ref 0.0–1.2)
CALCIUM: 9.8 mg/dL (ref 8.7–10.3)
CO2: 29 mmol/L (ref 20–29)
CREATININE: 0.77 mg/dL (ref 0.57–1.00)
Chloride: 92 mmol/L — ABNORMAL LOW (ref 96–106)
GFR calc Af Amer: 81 mL/min/{1.73_m2} (ref >59)
GFR calc non Af Amer: 71 mL/min/{1.73_m2} (ref >59)
GLOBULIN, TOTAL: 2.6 g/dL (ref 1.5–4.5)
Glucose: 78 mg/dL (ref 65–99)
POTASSIUM: 4.1 mmol/L (ref 3.5–5.2)
SODIUM: 136 mmol/L (ref 134–144)
Total Protein: 6.9 g/dL (ref 6.0–8.5)

## 2016-06-26 LAB — TSH: TSH: 1.99 u[IU]/mL (ref 0.450–4.500)

## 2016-06-27 ENCOUNTER — Other Ambulatory Visit: Payer: Self-pay | Admitting: Cardiology

## 2016-06-30 ENCOUNTER — Telehealth: Payer: Self-pay

## 2016-07-01 ENCOUNTER — Other Ambulatory Visit: Payer: Self-pay | Admitting: Cardiology

## 2016-07-01 DIAGNOSIS — H35353 Cystoid macular degeneration, bilateral: Secondary | ICD-10-CM | POA: Diagnosis not present

## 2016-07-01 DIAGNOSIS — H40013 Open angle with borderline findings, low risk, bilateral: Secondary | ICD-10-CM | POA: Diagnosis not present

## 2016-07-01 DIAGNOSIS — Z961 Presence of intraocular lens: Secondary | ICD-10-CM | POA: Diagnosis not present

## 2016-07-01 DIAGNOSIS — H353232 Exudative age-related macular degeneration, bilateral, with inactive choroidal neovascularization: Secondary | ICD-10-CM | POA: Diagnosis not present

## 2016-07-01 NOTE — Telephone Encounter (Signed)
REFILL 

## 2016-07-02 ENCOUNTER — Other Ambulatory Visit: Payer: Self-pay | Admitting: Cardiology

## 2016-07-02 NOTE — Telephone Encounter (Signed)
Company must get approval after she decides who she is going to use. She is aware

## 2016-07-14 ENCOUNTER — Other Ambulatory Visit: Payer: Self-pay | Admitting: Family Medicine

## 2016-07-14 ENCOUNTER — Other Ambulatory Visit: Payer: Self-pay | Admitting: Cardiology

## 2016-07-15 ENCOUNTER — Other Ambulatory Visit: Payer: Self-pay | Admitting: Family Medicine

## 2016-07-15 DIAGNOSIS — Z9981 Dependence on supplemental oxygen: Secondary | ICD-10-CM | POA: Diagnosis not present

## 2016-07-15 DIAGNOSIS — J3489 Other specified disorders of nose and nasal sinuses: Secondary | ICD-10-CM | POA: Insufficient documentation

## 2016-07-15 NOTE — Progress Notes (Signed)
HPI The patient presents for followup of aortic stenosis. She has severe COPD on O2.  She is limited by her breathing issues. She denies any sudden change in this however.  She is having a cough with sinus drainage and nasal problems and actually saw ENT yesterday and is having therapy for this. She's not having any chest pressure, neck or arm discomfort. She's not having any new palpitations, presyncope or syncope. She's had no weight gain or edema.  No Known Allergies  Current Outpatient Prescriptions  Medication Sig Dispense Refill  . acetaminophen (TYLENOL) 500 MG tablet Take 500 mg by mouth every 6 (six) hours as needed (pain).     Marland Kitchen ALPRAZolam (XANAX) 1 MG tablet TAKE (1) TABLET TWICE A DAY AS NEEDED FOR ANXIETY OR INSOMNIA. 60 tablet 2  . aspirin 81 MG tablet Take 81 mg by mouth daily.    Marland Kitchen atorvastatin (LIPITOR) 40 MG tablet TAKE 1 TABLET DAILY 30 tablet 5  . brimonidine (ALPHAGAN) 0.2 % ophthalmic solution Place 1 drop into both eyes daily.    . budesonide (PULMICORT) 0.5 MG/2ML nebulizer solution NEBULIZE 1 VIAL TWICE A DAY 120 mL 11  . buPROPion (WELLBUTRIN SR) 200 MG 12 hr tablet Take 200 mg by mouth daily.    . chlorpheniramine (CHLOR-TRIMETON) 4 MG tablet Take 8 mg by mouth every 6 (six) hours as needed (DRIPPY NOSE). Reported on 03/09/2015    . dextromethorphan (DELSYM) 30 MG/5ML liquid Take 2 tsp every 12 hours as needed for cough    . fenofibrate micronized (LOFIBRA) 134 MG capsule Take 1 capsule (134 mg total) by mouth daily before breakfast. KEEP OV. 30 capsule 0  . ipratropium-albuterol (DUONEB) 0.5-2.5 (3) MG/3ML SOLN NEBULIZE 1 VIAL 4 TIMES A DAY 360 mL 11  . ketorolac (ACULAR) 0.5 % ophthalmic solution Place 1 drop into both eyes 4 (four) times daily.    Marland Kitchen levothyroxine (SYNTHROID, LEVOTHROID) 50 MCG tablet TAKE 1 TABLET DAILY 30 tablet 3  . meclizine (ANTIVERT) 25 MG tablet Take 25 mg by mouth every 8 (eight) hours as needed for dizziness.     . metoprolol tartrate  (LOPRESSOR) 25 MG tablet Take 1/2 tablet 2 (two) times daily. 15 tablet 0  . montelukast (SINGULAIR) 10 MG tablet TAKE ONE TABLET AT BEDTIME 30 tablet 11  . NAMENDA XR 28 MG CP24 24 hr capsule Take 1 capsule (28 mg total) by mouth daily. 30 capsule 5  . OXYGEN Oxygen 3lpm 24/7, Increase to 4L as needed for shortness of breath with activity    . prednisoLONE acetate (PRED FORTE) 1 % ophthalmic suspension Place 1 drop into both eyes 4 (four) times daily.    Marland Kitchen PROAIR HFA 108 (90 Base) MCG/ACT inhaler INHALE 2 PUFFS BY MOUTH EVERY 6 HOURS AS NEEDED 8.5 g 5  . QUEtiapine (SEROQUEL) 50 MG tablet Take 2 tablets (100 mg total) by mouth at bedtime. 30 tablet 1  . ranitidine (ZANTAC) 150 MG tablet TAKE ONE TABLET AT BEDTIME 30 tablet 11  . sodium chloride (MURO 128) 2 % ophthalmic solution Place 1 drop into both eyes at bedtime.    . sodium chloride (OCEAN) 0.65 % SOLN nasal spray 2 puffs every 4-6 hours as needed for nasal congestion    . valsartan-hydrochlorothiazide (DIOVAN-HCT) 80-12.5 MG tablet TAKE 1 TABLET DAILY 30 tablet 4  . Vitamin D, Ergocalciferol, (DRISDOL) 50000 units CAPS capsule Take 1 capsule (50,000 Units total) by mouth every 7 (seven) days. 12 capsule 0  .  fluticasone (FLONASE) 50 MCG/ACT nasal spray Place 2 sprays into both nostrils daily.    Marland Kitchen Respiratory Therapy Supplies (FLUTTER) DEVI As needed     No current facility-administered medications for this visit.     Past Medical History:  Diagnosis Date  . Alzheimer's disease 03/29/2015  . Anxiety   . Aortic stenosis, mild   . Arthritis   . Asthma   . Back pain   . Breast cancer ([place])   . COPD (chronic obstructive pulmonary disease) ([place])   . DEMENTIA   . Depression   . GERD (gastroesophageal reflux disease)   . H/O hiatal hernia   . Hypertension   . Hypothyroidism 06/25/2016  . Lymphadenopathy 10/07/2012  . Obesity   . Oxygen dependent   . Pneumonia   . Right hamstring muscle strain 04/26/2013  . Sleep apnea     Past  Surgical History:  Procedure Laterality Date  . APPENDECTOMY    . BREAST RECONSTRUCTION    . CARDIAC CATHETERIZATION     EF 55-60%  . MASTECTOMY  1980  . TONSILLECTOMY AND ADENOIDECTOMY  1942    ROS:  As stated in the HPI and negative for all other systems.  PHYSICAL EXAM BP 128/60   Pulse 80   Ht 5\' 5"  (1.651 m)   Wt 176 lb (79.8 kg)   BMI 29.29 kg/m   GENERAL:  Well appearing NECK:  No jugular venous distention, waveform within normal limits, carotid upstroke brisk and symmetric, no bruits, no thyromegaly LUNGS:  Clear to auscultation bilaterally BACK:  No CVA tenderness HEART:  PMI not displaced or sustained,S1 and S2 within normal limits, no S3, no S4, no clicks, no rubs, 2 out of 6 apical systolic murmur radiating slightly out the aortic outflow tract and into the carotids, early mid peaking, no diastolic murmurs ABD:  Flat, positive bowel sounds normal in frequency in pitch, no bruits, no rebound, no guarding, no midline pulsatile mass, no hepatomegaly, no splenomegaly EXT:  2 plus pulses throughout, no edema, no cyanosis no clubbing  EKG:  Sinus rhythm, rate 80, left axis deviation, left anterior fascicular block, interventricular conduction delay, no acute ST-T wave changes. 07/17/2016  ASSESSMENT AND PLAN  Aortic stenosis - This has been mild aortic stenosis. I don't think that this is progressed to be clinically significant. No change in therapy is indicated. No further imaging is indicated.change in therapy is indicated.    HTN (hypertension) -  Her blood pressure is at target.  No change in therapy is indicated.   Carotid stenosis - This was mild last year.  No change in therapy or further imaging is indicated.

## 2016-07-15 NOTE — Telephone Encounter (Signed)
Last seen 6.20/18  Dr Warrick Parisian  If approved route to nurse to call into [facility]

## 2016-07-16 ENCOUNTER — Encounter: Payer: Self-pay | Admitting: Cardiology

## 2016-07-16 ENCOUNTER — Ambulatory Visit (INDEPENDENT_AMBULATORY_CARE_PROVIDER_SITE_OTHER): Payer: Medicare Other | Admitting: Cardiology

## 2016-07-16 VITALS — BP 128/60 | HR 80 | Ht 65.0 in | Wt 176.0 lb

## 2016-07-16 DIAGNOSIS — I350 Nonrheumatic aortic (valve) stenosis: Secondary | ICD-10-CM

## 2016-07-16 DIAGNOSIS — I10 Essential (primary) hypertension: Secondary | ICD-10-CM

## 2016-07-16 NOTE — Telephone Encounter (Signed)
Called to [name] 

## 2016-07-16 NOTE — Patient Instructions (Signed)

## 2016-07-16 NOTE — Telephone Encounter (Signed)
Go ahead and call in one month with one month refill

## 2016-07-17 ENCOUNTER — Encounter: Payer: Self-pay | Admitting: Cardiology

## 2016-07-19 DIAGNOSIS — J449 Chronic obstructive pulmonary disease, unspecified: Secondary | ICD-10-CM | POA: Diagnosis not present

## 2016-07-21 DIAGNOSIS — J449 Chronic obstructive pulmonary disease, unspecified: Secondary | ICD-10-CM | POA: Diagnosis not present

## 2016-07-24 ENCOUNTER — Encounter (INDEPENDENT_AMBULATORY_CARE_PROVIDER_SITE_OTHER): Payer: Medicare Other | Admitting: Ophthalmology

## 2016-07-24 DIAGNOSIS — H353132 Nonexudative age-related macular degeneration, bilateral, intermediate dry stage: Secondary | ICD-10-CM | POA: Diagnosis not present

## 2016-07-24 DIAGNOSIS — H43813 Vitreous degeneration, bilateral: Secondary | ICD-10-CM

## 2016-07-24 DIAGNOSIS — I10 Essential (primary) hypertension: Secondary | ICD-10-CM | POA: Diagnosis not present

## 2016-07-24 DIAGNOSIS — H35033 Hypertensive retinopathy, bilateral: Secondary | ICD-10-CM

## 2016-07-24 DIAGNOSIS — H59033 Cystoid macular edema following cataract surgery, bilateral: Secondary | ICD-10-CM | POA: Diagnosis not present

## 2016-07-28 ENCOUNTER — Other Ambulatory Visit: Payer: Self-pay | Admitting: Cardiology

## 2016-07-28 NOTE — Telephone Encounter (Signed)
REFILL 

## 2016-08-04 ENCOUNTER — Telehealth: Payer: Self-pay | Admitting: Family Medicine

## 2016-08-04 NOTE — Telephone Encounter (Signed)
Spoke with pt's daughter regarding pt's symptoms Offered appt for evaluation Daughter is going out of town and will not be back until 08/18/2016 Pt will continue to monitor BS Will call to schedule appt is sxs persist or worsen

## 2016-08-05 ENCOUNTER — Other Ambulatory Visit: Payer: Self-pay | Admitting: Cardiology

## 2016-08-05 DIAGNOSIS — H40053 Ocular hypertension, bilateral: Secondary | ICD-10-CM | POA: Diagnosis not present

## 2016-08-05 DIAGNOSIS — Z961 Presence of intraocular lens: Secondary | ICD-10-CM | POA: Diagnosis not present

## 2016-08-05 NOTE — Telephone Encounter (Signed)
Rx(s) sent to pharmacy electronically.  

## 2016-08-19 ENCOUNTER — Emergency Department (HOSPITAL_COMMUNITY)
Admission: EM | Admit: 2016-08-19 | Discharge: 2016-08-19 | Disposition: A | Discharge status: Home / Self Care | Payer: Medicare Other | Attending: Emergency Medicine | Admitting: Emergency Medicine

## 2016-08-19 ENCOUNTER — Encounter (HOSPITAL_COMMUNITY): Payer: Self-pay | Admitting: Emergency Medicine

## 2016-08-19 ENCOUNTER — Emergency Department (HOSPITAL_COMMUNITY): Payer: Medicare Other

## 2016-08-19 DIAGNOSIS — J449 Chronic obstructive pulmonary disease, unspecified: Secondary | ICD-10-CM | POA: Diagnosis not present

## 2016-08-19 DIAGNOSIS — R9431 Abnormal electrocardiogram [ECG] [EKG]: Secondary | ICD-10-CM | POA: Diagnosis not present

## 2016-08-19 DIAGNOSIS — Z9981 Dependence on supplemental oxygen: Secondary | ICD-10-CM | POA: Diagnosis not present

## 2016-08-19 DIAGNOSIS — J441 Chronic obstructive pulmonary disease with (acute) exacerbation: Secondary | ICD-10-CM | POA: Diagnosis not present

## 2016-08-19 DIAGNOSIS — R350 Frequency of micturition: Secondary | ICD-10-CM | POA: Diagnosis not present

## 2016-08-19 DIAGNOSIS — Z87891 Personal history of nicotine dependence: Secondary | ICD-10-CM | POA: Insufficient documentation

## 2016-08-19 DIAGNOSIS — G309 Alzheimer's disease, unspecified: Secondary | ICD-10-CM | POA: Insufficient documentation

## 2016-08-19 DIAGNOSIS — R0602 Shortness of breath: Secondary | ICD-10-CM

## 2016-08-19 DIAGNOSIS — Z79899 Other long term (current) drug therapy: Secondary | ICD-10-CM | POA: Diagnosis not present

## 2016-08-19 DIAGNOSIS — Z853 Personal history of malignant neoplasm of breast: Secondary | ICD-10-CM | POA: Insufficient documentation

## 2016-08-19 LAB — BLOOD GAS, ARTERIAL
Acid-Base Excess: 6.5 mmol/L — ABNORMAL HIGH (ref 0.0–2.0)
Bicarbonate: 32.6 mmol/L — ABNORMAL HIGH (ref 20.0–28.0)
Drawn by: 270211
O2 Content: 3.5 L/min
O2 SAT: 95.4 %
PATIENT TEMPERATURE: 98.6
PCO2 ART: 56.4 mmHg — AB (ref 32.0–48.0)
PH ART: 7.381 (ref 7.350–7.450)
pO2, Arterial: 82.1 mmHg — ABNORMAL LOW (ref 83.0–108.0)

## 2016-08-19 LAB — BASIC METABOLIC PANEL
Anion gap: 7 (ref 5–15)
BUN: 11 mg/dL (ref 6–20)
CO2: 34 mmol/L — AB (ref 22–32)
Calcium: 9.3 mg/dL (ref 8.9–10.3)
Chloride: 97 mmol/L — ABNORMAL LOW (ref 101–111)
Creatinine, Ser: 0.67 mg/dL (ref 0.44–1.00)
GFR calc Af Amer: >60 mL/min (ref >60)
GLUCOSE: 96 mg/dL (ref 65–99)
POTASSIUM: 3.5 mmol/L (ref 3.5–5.1)
Sodium: 138 mmol/L (ref 135–145)

## 2016-08-19 LAB — URINALYSIS, ROUTINE W REFLEX MICROSCOPIC
Bilirubin Urine: NEGATIVE
Glucose, UA: NEGATIVE mg/dL
HGB URINE DIPSTICK: NEGATIVE
KETONES UR: NEGATIVE mg/dL
NITRITE: NEGATIVE
PROTEIN: NEGATIVE mg/dL
RBC / HPF: NONE SEEN RBC/hpf (ref 0–5)
Specific Gravity, Urine: 1.005 (ref 1.005–1.030)
pH: 8 (ref 5.0–8.0)

## 2016-08-19 LAB — CBC
HEMATOCRIT: 37.2 % (ref 36.0–46.0)
Hemoglobin: 11.8 g/dL — ABNORMAL LOW (ref 12.0–15.0)
MCH: 29.4 pg (ref 26.0–34.0)
MCHC: 31.7 g/dL (ref 30.0–36.0)
MCV: 92.8 fL (ref 78.0–100.0)
Platelets: 226 10*3/uL (ref 150–400)
RBC: 4.01 MIL/uL (ref 3.87–5.11)
RDW: 12.4 % (ref 11.5–15.5)
WBC: 9 10*3/uL (ref 4.0–10.5)

## 2016-08-19 LAB — I-STAT TROPONIN, ED: Troponin i, poc: 0.02 ng/mL (ref 0.00–0.08)

## 2016-08-19 MED ORDER — ALBUTEROL SULFATE (2.5 MG/3ML) 0.083% IN NEBU
5.0000 mg | INHALATION_SOLUTION | Freq: Once | RESPIRATORY_TRACT | Status: DC
  Filled 2016-08-19: qty 6

## 2016-08-19 NOTE — ED Triage Notes (Signed)
Pt states that she has COPD.  Pt on 5 liters of 02 at home.  Has been hoarse for the past 2 days.  Pt denies chest pain but states that she has been increasingly short of breath.

## 2016-08-19 NOTE — Discharge Instructions (Signed)
Please read attached information regarding your condition. Take all medications as previously prescribed. Follow-up with PCP in specialist for further evaluation if symptoms persist. Return to ED for worsening chest pain, increased shortness of breath, coughing up blood, leg swelling, fevers.

## 2016-08-19 NOTE — ED Provider Notes (Signed)
[facility] DEPT Provider Note   CSN: [id number] Arrival date & time: 08/19/16  8299     History   Chief Complaint Chief Complaint  Patient presents with  . Shortness of Breath    HPI Katie Bryant is a 83 y.o. female.  HPI Patient presents to ED for evaluation of shortness of breath for the past several weeks, as well as hoarseness for the past 2 days. She also complains of urinary incontinence 1 episode today. She is on 5 L of oxygen at home due to her COPD. She has not had to increase her oxygen. She has tried her home nebulizer treatments and inhaler with some improvement in her symptoms. She also reports intermittent cough, nonproductive.  She is here with her daughter who states that patient has had several pulmonologist appointments, and primary care appointments and urgent care visits since the symptoms began. She states that her mother called her today stating that "I want to go to the hospital to find out if I'm dying or not." She denies chest pain, hemoptysis, leg swelling, prior history of MI, DVT, PE, asthma, abdominal pain, dysuria, diarrhea, constipation, fevers or chills.  Past Medical History:  Diagnosis Date  . Alzheimer's disease 03/29/2015  . Anxiety   . Aortic stenosis, mild   . Arthritis   . Asthma   . Back pain   . Breast cancer ([place])   . COPD (chronic obstructive pulmonary disease) ([place])   . DEMENTIA   . Depression   . GERD (gastroesophageal reflux disease)   . H/O hiatal hernia   . Hypertension   . Hypothyroidism 06/25/2016  . Lymphadenopathy 10/07/2012  . Obesity   . Oxygen dependent   . Pneumonia   . Right hamstring muscle strain 04/26/2013  . Sleep apnea     Patient Active Problem List   Diagnosis Date Noted  . Hypothyroidism 06/25/2016  . Anxiety and depression 04/25/2015  . Alzheimer's disease 03/29/2015  . Lymphadenopathy 10/07/2012  . Chronic respiratory failure with hypercapnia ([place]) 03/11/2011  . HTN (hypertension) 02/12/2011    . Hyperlipidemia 02/12/2011  . Insomnia 12/22/2010  . Aortic stenosis 07/03/2010  . G E R D 06/06/2007  . Morbid obesity ([place]) 05/25/2007  . COPD GOLD III/ 02 dep  05/25/2007    Past Surgical History:  Procedure Laterality Date  . APPENDECTOMY    . BREAST RECONSTRUCTION    . CARDIAC CATHETERIZATION     EF 55-60%  . MASTECTOMY  1980  . TONSILLECTOMY AND ADENOIDECTOMY  1942    OB History    No data available       Home Medications    Prior to Admission medications   Medication Sig Start Date End Date Taking? Authorizing Provider  acetaminophen (TYLENOL) 500 MG tablet Take 500 mg by mouth every 6 (six) hours as needed (pain).    Yes [provider]  ALPRAZolam (XANAX) 1 MG tablet TAKE (1) TABLET TWICE A DAY AS NEEDED FOR ANXIETY OR INSOMNIA. Patient taking differently: TAKE 0.5 TABLET TWICE A DAY AS NEEDED FOR ANXIETY OR INSOMNIA. 07/16/16  Yes Dettinger, Fransisca Kaufmann, MD  aspirin 81 MG tablet Take 81 mg by mouth daily.   Yes [provider]  atorvastatin (LIPITOR) 40 MG tablet TAKE 1 TABLET DAILY 04/09/16  Yes Dettinger, Fransisca Kaufmann, MD  brimonidine (ALPHAGAN) 0.2 % ophthalmic solution Place 1 drop into both eyes daily.   Yes [provider]  budesonide (PULMICORT) 0.5 MG/2ML nebulizer solution NEBULIZE 1 VIAL TWICE A  DAY 03/04/16  Yes Tanda Rockers, MD  buPROPion [facility] SR) 200 MG 12 hr tablet Take 200 mg by mouth daily.   Yes [provider]  cetirizine (ZYRTEC) 10 MG tablet Take 10 mg by mouth daily as needed for allergies.   Yes [provider]  chlorpheniramine (CHLOR-TRIMETON) 4 MG tablet Take 8 mg by mouth every 6 (six) hours as needed (DRIPPY NOSE). Reported on 03/09/2015   Yes [provider]  dextromethorphan (DELSYM) 30 MG/5ML liquid Take 2 tsp every 12 hours as needed for cough   Yes [provider]  dorzolamide (TRUSOPT) 2 % ophthalmic solution Place 1 drop into both eyes 2 (two) times daily. 08/05/16  Yes  [provider]  fenofibrate micronized (LOFIBRA) 134 MG capsule TAKE (1) CAPSULE DAILY BEFORE BREAKFAST. 07/28/16  Yes Hochrein, Jeneen Rinks, MD  ipratropium-albuterol (DUONEB) 0.5-2.5 (3) MG/3ML SOLN NEBULIZE 1 VIAL 4 TIMES A DAY 03/04/16  Yes Tanda Rockers, MD  ketorolac (ACULAR) 0.5 % ophthalmic solution Place 1 drop into both eyes 4 (four) times daily.   Yes [provider]  levothyroxine (SYNTHROID, LEVOTHROID) 50 MCG tablet TAKE 1 TABLET DAILY 07/16/16  Yes Dettinger, Fransisca Kaufmann, MD  LOTEMAX 0.5 % GEL Place 1 drop into both eyes 3 (three) times daily. 08/16/16  Yes [provider]  metoprolol tartrate (LOPRESSOR) 25 MG tablet TAKE (1/2) TABLET TWICE DAILY. 08/05/16  Yes Minus Breeding, MD  montelukast (SINGULAIR) 10 MG tablet TAKE ONE TABLET AT BEDTIME 04/01/16  Yes Dettinger, Fransisca Kaufmann, MD  NAMENDA XR 28 MG CP24 24 hr capsule Take 1 capsule (28 mg total) by mouth daily. 04/01/16  Yes Dettinger, Fransisca Kaufmann, MD  OXYGEN Oxygen 3lpm 24/7, Increase to 4L as needed for shortness of breath with activity   Yes [provider]  PROAIR HFA 108 (90 Base) MCG/ACT inhaler INHALE 2 PUFFS BY MOUTH EVERY 6 HOURS AS NEEDED 08/22/15  Yes Tanda Rockers, MD  ranitidine (ZANTAC) 150 MG tablet TAKE ONE TABLET AT BEDTIME 10/23/15  Yes Tanda Rockers, MD  Respiratory Therapy Supplies (FLUTTER) DEVI As needed   Yes [provider]  RHOPRESSA 0.02 % SOLN Place 1 drop into both eyes at bedtime. 08/05/16  Yes [provider]  sodium chloride (MURO 128) 2 % ophthalmic solution Place 1 drop into both eyes at bedtime.   Yes [provider]  sodium chloride (OCEAN) 0.65 % SOLN nasal spray 2 puffs every 4-6 hours as needed for nasal congestion   Yes [provider]  valsartan-hydrochlorothiazide (DIOVAN-HCT) 80-12.5 MG tablet TAKE 1 TABLET DAILY 05/26/16  Yes Dettinger, Fransisca Kaufmann, MD  Vitamin D, Ergocalciferol, (DRISDOL) 50000 units CAPS capsule Take 1 capsule (50,000  Units total) by mouth every 7 (seven) days. 06/11/16  Yes Dettinger, Fransisca Kaufmann, MD  QUEtiapine (SEROQUEL) 50 MG tablet Take 2 tablets (100 mg total) by mouth at bedtime. Patient not taking: Reported on 08/19/2016 06/25/16   Dettinger, Fransisca Kaufmann, MD    Family History Family History  Problem Relation Age of Onset  . Tuberculosis Mother   . Lung cancer Father     Social History Social History  Substance Use Topics  . Smoking status: Former Smoker    Packs/day: 1.00    Years: 40.00    Types: Cigarettes    Quit date: 02/11/1990  . Smokeless tobacco: Never Used  . Alcohol use 4.2 oz/week    7 Glasses of wine per week     Allergies   Patient  has no known allergies.   Review of Systems Review of Systems  Constitutional: Negative for appetite change, chills and fever.  HENT: Negative for ear pain, rhinorrhea, sneezing and sore throat.   Eyes: Negative for photophobia and visual disturbance.  Respiratory: Positive for cough. Negative for chest tightness, shortness of breath and wheezing.   Cardiovascular: Negative for chest pain and palpitations.  Gastrointestinal: Positive for constipation. Negative for abdominal pain, blood in stool, diarrhea, nausea and vomiting.  Genitourinary: Positive for frequency. Negative for dysuria, hematuria and urgency.  Musculoskeletal: Negative for myalgias.  Skin: Negative for rash.  Neurological: Negative for dizziness, weakness and light-headedness.     Physical Exam Updated Vital Signs BP 140/60   Pulse 84   Temp 97.9 F (36.6 C)   Resp 15   SpO2 98%   Physical Exam  Constitutional: She is oriented to person, place, and time. She appears well-developed and well-nourished. No distress.  Elderly appearing female, well nourished. 5L  in place. Nontoxic appearing, in no acute distress. Speaking in full sentences.  HENT:  Head: Normocephalic and atraumatic.  Nose: Nose normal.  Eyes: Conjunctivae and EOM are normal. Left eye exhibits no  discharge. No scleral icterus.  Neck: Normal range of motion. Neck supple.  Cardiovascular: Normal rate, regular rhythm, normal heart sounds and intact distal pulses.  Exam reveals no gallop and no friction rub.   No murmur heard. Pulmonary/Chest: Effort normal. No respiratory distress. She has decreased breath sounds.  No respiratory distress. Overall diminished breath sounds. No wheezing, rales or rhonchi.  Abdominal: Soft. Bowel sounds are normal. She exhibits no distension. There is no tenderness. There is no guarding.  Musculoskeletal: Normal range of motion. She exhibits no edema or tenderness.  Neurological: She is alert and oriented to person, place, and time. She exhibits normal muscle tone. Coordination normal.  Skin: Skin is warm and dry. No rash noted.  Psychiatric: She has a normal mood and affect.  Nursing note and vitals reviewed.    ED Treatments / Results  Labs (all labs ordered are listed, but only abnormal results are displayed) Labs Reviewed  BASIC METABOLIC PANEL - Abnormal; Notable for the following:       Result Value   Chloride 97 (*)    CO2 34 (*)    All other components within normal limits  CBC - Abnormal; Notable for the following:    Hemoglobin 11.8 (*)    All other components within normal limits  URINALYSIS, ROUTINE W REFLEX MICROSCOPIC - Abnormal; Notable for the following:    Color, Urine STRAW (*)    Leukocytes, UA SMALL (*)    Bacteria, UA RARE (*)    Squamous Epithelial / LPF 0-5 (*)    Non Squamous Epithelial 0-5 (*)    All other components within normal limits  BLOOD GAS, ARTERIAL - Abnormal; Notable for the following:    pCO2 arterial 56.4 (*)    pO2, Arterial 82.1 (*)    Bicarbonate 32.6 (*)    Acid-Base Excess 6.5 (*)    All other components within normal limits  URINE CULTURE  I-STAT TROPONIN, ED    EKG  EKG Interpretation  Date/Time:  Tuesday August 19 2016 09:13:06 EDT Ventricular Rate:  85 PR Interval:    QRS  Duration: 119 QT Interval:  371 QTC Calculation: 442 R Axis:   -60 Text Interpretation:  Sinus rhythm Ventricular premature complex Left anterior fascicular block LVH with secondary repolarization abnormality Since last tracing rate slower Confirmed  by Dorie Rank [phone]) on 08/19/2016 9:19:37 AM       Radiology Dg Chest 2 View  Result Date: 08/19/2016 CLINICAL DATA:  Increasing shortness of breath and hoarseness over the past several days. History of COPD, former smoker. EXAM: CHEST  2 VIEW COMPARISON:  Chest x-ray of June 09, 2014 FINDINGS: The lungs are hyperinflated with hemidiaphragm flattening. The interstitial markings are increased bilaterally though this is not new. There is no alveolar infiltrate or pleural effusion. The heart is normal in size and contour. There is chronic central pulmonary vascular prominence. There is calcification in the wall of the aortic arch. The mediastinum is normal in width. The bony thorax exhibits no acute abnormality. IMPRESSION: COPD with chronic interstitial changes. Chronic central pulmonary vascular prominence may reflect pulmonary hypertension. Thoracic aortic atherosclerosis. Electronically Signed   By: David  Martinique M.D.   On: 08/19/2016 10:54    Procedures Procedures (including critical care time)  Medications Ordered in ED Medications - No data to display   Initial Impression / Assessment and Plan / ED Course  I have reviewed the triage vital signs and the nursing notes.  Pertinent labs & imaging results that were available during my care of the patient were reviewed by me and considered in my medical decision making (see chart for details).     Patient, with past medical history of COPD, anxiety, dementia presents to ED for evaluation of shortness of breath for the past several weeks. She has been seen several times by her pulmonologist, PCP and urgent care visits since her symptoms began. She has tried home nebulizer and inhalers with some  improvement in her symptoms. Physical exam she is nontoxic-appearing and not in acute distress. She is on 5 L nasal cannula here in the ED. She denies any chest pain, hemoptysis, leg swelling, prior MI, DVT or PE. Afebrile with no history of fever. Low suspicion for PE based on symptoms and onset. Chest x-ray showed chronic interstitial changes due to COPD. Urinalysis showed small leukocytes and rare bacteria the patient denies any dysuria. CBC unremarkable. BMP showed decreased chloride and elevated CO2. Troponin negative 1. ABG SHOWED NORMAL PH WITH CHRONIC CHANGES TO PO2 AND PCO2 showing compensation of respiratory acidosis. She remains in no respiratory distress. Patient has stated several times that she would like to go home. However I encouraged her to stay for lab work. She declined breathing treatment. I reassured her of her negative workup today and advised her to follow up with PCP for further evaluation if symptoms persist.  I encouraged her to continue home medications as previously prescribed. Patient appears stable for discharge at this time. Strict return precautions given.   Patient discussed with and seen by Dr. Tomi Bamberger.  Final Clinical Impressions(s) / ED Diagnoses   Final diagnoses:  SOB (shortness of breath)  COPD exacerbation [facility])    New Prescriptions New Prescriptions   No medications on file     Delia Heady, Hershal Coria 08/19/16 1327    Dorie Rank, MD 08/22/16 1024

## 2016-08-20 LAB — URINE CULTURE: Culture: <10000 — AB

## 2016-08-21 DIAGNOSIS — H40053 Ocular hypertension, bilateral: Secondary | ICD-10-CM | POA: Diagnosis not present

## 2016-08-21 DIAGNOSIS — J449 Chronic obstructive pulmonary disease, unspecified: Secondary | ICD-10-CM | POA: Diagnosis not present

## 2016-08-28 ENCOUNTER — Telehealth: Payer: Self-pay | Admitting: *Deleted

## 2016-08-28 NOTE — Telephone Encounter (Signed)
Per daughter, pt is supposed to be getting [place] aide for 35 hours per week due to COPD Daughter has contacted insurance and [place] Please review and advise

## 2016-08-28 NOTE — Telephone Encounter (Signed)
Demographics faxed to [facility] to get home health started

## 2016-08-29 ENCOUNTER — Encounter (HOSPITAL_COMMUNITY): Payer: Self-pay | Admitting: Emergency Medicine

## 2016-08-29 ENCOUNTER — Emergency Department (HOSPITAL_COMMUNITY)
Admission: EM | Admit: 2016-08-29 | Discharge: 2016-08-29 | Disposition: A | Discharge status: Home / Self Care | Payer: Medicare Other | Attending: Emergency Medicine | Admitting: Emergency Medicine

## 2016-08-29 DIAGNOSIS — Z7982 Long term (current) use of aspirin: Secondary | ICD-10-CM | POA: Insufficient documentation

## 2016-08-29 DIAGNOSIS — I10 Essential (primary) hypertension: Secondary | ICD-10-CM | POA: Insufficient documentation

## 2016-08-29 DIAGNOSIS — F419 Anxiety disorder, unspecified: Secondary | ICD-10-CM | POA: Insufficient documentation

## 2016-08-29 DIAGNOSIS — J45909 Unspecified asthma, uncomplicated: Secondary | ICD-10-CM | POA: Insufficient documentation

## 2016-08-29 DIAGNOSIS — Z87891 Personal history of nicotine dependence: Secondary | ICD-10-CM | POA: Insufficient documentation

## 2016-08-29 DIAGNOSIS — Z853 Personal history of malignant neoplasm of breast: Secondary | ICD-10-CM | POA: Diagnosis not present

## 2016-08-29 DIAGNOSIS — J449 Chronic obstructive pulmonary disease, unspecified: Secondary | ICD-10-CM | POA: Insufficient documentation

## 2016-08-29 DIAGNOSIS — G309 Alzheimer's disease, unspecified: Secondary | ICD-10-CM | POA: Diagnosis not present

## 2016-08-29 DIAGNOSIS — Z79899 Other long term (current) drug therapy: Secondary | ICD-10-CM | POA: Diagnosis not present

## 2016-08-29 DIAGNOSIS — E039 Hypothyroidism, unspecified: Secondary | ICD-10-CM | POA: Insufficient documentation

## 2016-08-29 DIAGNOSIS — R061 Stridor: Secondary | ICD-10-CM | POA: Diagnosis not present

## 2016-08-29 DIAGNOSIS — I6789 Other cerebrovascular disease: Secondary | ICD-10-CM | POA: Diagnosis not present

## 2016-08-29 DIAGNOSIS — R451 Restlessness and agitation: Secondary | ICD-10-CM | POA: Diagnosis present

## 2016-08-29 LAB — RAPID URINE DRUG SCREEN, HOSP PERFORMED
AMPHETAMINES: NOT DETECTED
Barbiturates: NOT DETECTED
Benzodiazepines: POSITIVE — AB
Cocaine: NOT DETECTED
Opiates: NOT DETECTED
TETRAHYDROCANNABINOL: NOT DETECTED

## 2016-08-29 LAB — CBC WITH DIFFERENTIAL/PLATELET
Basophils Absolute: 0 10*3/uL (ref 0.0–0.1)
Basophils Relative: 0 %
EOS ABS: 0.1 10*3/uL (ref 0.0–0.7)
EOS PCT: 1 %
HCT: 39.6 % (ref 36.0–46.0)
Hemoglobin: 12.6 g/dL (ref 12.0–15.0)
LYMPHS ABS: 1.3 10*3/uL (ref 0.7–4.0)
LYMPHS PCT: 15 %
MCH: 29.6 pg (ref 26.0–34.0)
MCHC: 31.8 g/dL (ref 30.0–36.0)
MCV: 93.2 fL (ref 78.0–100.0)
MONOS PCT: 7 %
Monocytes Absolute: 0.6 10*3/uL (ref 0.1–1.0)
Neutro Abs: 6.8 10*3/uL (ref 1.7–7.7)
Neutrophils Relative %: 77 %
PLATELETS: 220 10*3/uL (ref 150–400)
RBC: 4.25 MIL/uL (ref 3.87–5.11)
RDW: 12.3 % (ref 11.5–15.5)
WBC: 8.9 10*3/uL (ref 4.0–10.5)

## 2016-08-29 LAB — BASIC METABOLIC PANEL
Anion gap: 7 (ref 5–15)
BUN: 11 mg/dL (ref 6–20)
CHLORIDE: 100 mmol/L — AB (ref 101–111)
CO2: 33 mmol/L — ABNORMAL HIGH (ref 22–32)
CREATININE: 0.73 mg/dL (ref 0.44–1.00)
Calcium: 9.4 mg/dL (ref 8.9–10.3)
GFR calc Af Amer: >60 mL/min (ref >60)
GFR calc non Af Amer: >60 mL/min (ref >60)
GLUCOSE: 104 mg/dL — AB (ref 65–99)
POTASSIUM: 3.7 mmol/L (ref 3.5–5.1)
SODIUM: 140 mmol/L (ref 135–145)

## 2016-08-29 MED ORDER — HYDROXYZINE HCL 25 MG PO TABS
25.0000 mg | ORAL_TABLET | Freq: Once | ORAL | Status: AC
  Administered 2016-08-29: 25 mg via ORAL
  Filled 2016-08-29: qty 1
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
## 2016-08-29 NOTE — ED Notes (Signed)
Daughter at bedside, edp aware.
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
## 2016-08-29 NOTE — Discharge Instructions (Signed)
Follow up as needed

## 2016-09-09 ENCOUNTER — Other Ambulatory Visit: Payer: Self-pay | Admitting: Family Medicine

## 2016-09-09 ENCOUNTER — Telehealth: Payer: Self-pay | Admitting: Family Medicine
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
## 2016-09-09 NOTE — Telephone Encounter (Signed)
Patients daughter will not disclose why she is why she is calling states it is personal and will only talk to [place].

## 2016-09-10 DIAGNOSIS — H40053 Ocular hypertension, bilateral: Secondary | ICD-10-CM | POA: Diagnosis not present

## 2016-09-12 DIAGNOSIS — Z79899 Other long term (current) drug therapy: Secondary | ICD-10-CM | POA: Diagnosis not present

## 2016-09-12 DIAGNOSIS — Z7982 Long term (current) use of aspirin: Secondary | ICD-10-CM | POA: Diagnosis not present

## 2016-09-12 DIAGNOSIS — Z87891 Personal history of nicotine dependence: Secondary | ICD-10-CM | POA: Diagnosis not present

## 2016-09-12 DIAGNOSIS — J439 Emphysema, unspecified: Secondary | ICD-10-CM | POA: Diagnosis not present

## 2016-09-12 DIAGNOSIS — J449 Chronic obstructive pulmonary disease, unspecified: Secondary | ICD-10-CM | POA: Diagnosis not present

## 2016-09-12 DIAGNOSIS — G309 Alzheimer's disease, unspecified: Secondary | ICD-10-CM | POA: Diagnosis not present

## 2016-09-12 DIAGNOSIS — I517 Cardiomegaly: Secondary | ICD-10-CM | POA: Diagnosis not present

## 2016-09-12 DIAGNOSIS — Z7951 Long term (current) use of inhaled steroids: Secondary | ICD-10-CM | POA: Diagnosis not present

## 2016-09-12 DIAGNOSIS — Z9981 Dependence on supplemental oxygen: Secondary | ICD-10-CM | POA: Diagnosis not present

## 2016-09-13 DIAGNOSIS — G309 Alzheimer's disease, unspecified: Secondary | ICD-10-CM | POA: Diagnosis not present

## 2016-09-15 ENCOUNTER — Telehealth: Payer: Self-pay | Admitting: Family Medicine

## 2016-09-15 NOTE — Telephone Encounter (Signed)
Commonly in and be seen ASAP and we can see what we can do for her

## 2016-09-15 NOTE — Telephone Encounter (Signed)
LM for pt to call - will need appt with [facility] asap

## 2016-09-15 NOTE — Telephone Encounter (Signed)
DR D- daughter thinks she is going through DTs - you do not have an opening until WED morning - she is asking for something just to get her through the next couple of days ? Maybe something like Buspar?

## 2016-09-16 ENCOUNTER — Other Ambulatory Visit: Payer: Self-pay | Admitting: Family Medicine

## 2016-09-16 NOTE — Telephone Encounter (Signed)
If she is truly going through DTs then she needs to go to the hospital but for now if she wants to taper her down. We can call her in 10- 1 mg pills and she can use those to taper her down.

## 2016-09-17 NOTE — Telephone Encounter (Signed)
Phoned in.

## 2016-09-18 ENCOUNTER — Telehealth: Payer: Self-pay | Admitting: Family Medicine

## 2016-09-18 ENCOUNTER — Encounter: Payer: Self-pay | Admitting: Family Medicine

## 2016-09-18 ENCOUNTER — Ambulatory Visit (INDEPENDENT_AMBULATORY_CARE_PROVIDER_SITE_OTHER): Payer: Medicare Other | Admitting: Family Medicine

## 2016-09-18 VITALS — BP 134/64 | HR 78 | Temp 97.7°F | Ht 65.0 in | Wt 155.0 lb

## 2016-09-18 DIAGNOSIS — G3183 Dementia with Lewy bodies: Secondary | ICD-10-CM

## 2016-09-18 DIAGNOSIS — F0281 Dementia in other diseases classified elsewhere with behavioral disturbance: Secondary | ICD-10-CM | POA: Diagnosis not present

## 2016-09-18 DIAGNOSIS — F419 Anxiety disorder, unspecified: Principal | ICD-10-CM

## 2016-09-18 DIAGNOSIS — F0280 Dementia in other diseases classified elsewhere without behavioral disturbance: Secondary | ICD-10-CM | POA: Insufficient documentation

## 2016-09-18 DIAGNOSIS — F329 Major depressive disorder, single episode, unspecified: Secondary | ICD-10-CM

## 2016-09-18 MED ORDER — ARIPIPRAZOLE 5 MG PO TABS: 5.0000 mg | ORAL_TABLET | Freq: Every day | ORAL | 2 refills | Status: DC

## 2016-09-18 NOTE — Telephone Encounter (Signed)
done

## 2016-09-18 NOTE — Progress Notes (Signed)
BP 134/64   Pulse 78   Temp 97.7 F (36.5 C) (Oral)   Ht 5\' 5"  (1.651 m)   Wt 155 lb (70.3 kg)   BMI 25.79 kg/m    Subjective:    Patient ID: Katie Bryant, female    DOB: 1928-05-24, 83 y.o.   MRN: [id number]  HPI: Katie Bryant is a 83 y.o. female presenting on 09/18/2016 for Weight Loss (21 lbs since July) and Falling frequently (fell 3 times on Monday; was put on Seroquel while in behavioral health, dauhter stopped this week because she was concerned it may be increasing her falls)   HPI Worsening dementia and weight loss Patient is brought in with her daughter who says that she is now living with her and has been having worsening dementia and she is concerned about her weight loss. She says that she has lost 21 pounds since July although our charts do not show that much weight loss but more in the 12-15 range is what we show. She says that she was recently in the ED and sent to behavioral health over a week ago and started on Seroquel 50 twice a day. The daughter has noticed that since she's been on the Seroquel and off of the Xanax she has been having a lot more falls and so over the past day and a half she has not given her any more Seroquel and she has noticed that she is not falling as frequently. She is concerned because the Seroquel was helping with the behaviors and now they need something else that might help with the behaviors but they want something that will not cause her to fall so frequently. She says she still has very frequent behavioral outbursts associated with her Lewy body dementia and is now living with the daughter and set up on her own because the daughter is afraid that she can no longer live on her own.  Relevant past medical, surgical, family and social history reviewed and updated as indicated. Interim medical history since our last visit reviewed. Allergies and medications reviewed and updated.  Review of Systems  Constitutional: Positive for unexpected  weight change. Negative for chills and fever.  Eyes: Negative for visual disturbance.  Respiratory: Negative for chest tightness and shortness of breath.   Cardiovascular: Negative for chest pain and leg swelling.  Genitourinary: Negative for difficulty urinating, dysuria, frequency, hematuria and urgency.  Musculoskeletal: Negative for back pain and gait problem.  Skin: Negative for rash.  Neurological: Negative for dizziness, light-headedness and headaches.  Psychiatric/Behavioral: Positive for behavioral problems, confusion, dysphoric mood and sleep disturbance. Negative for agitation, self-injury and suicidal ideas.  All other systems reviewed and are negative.   Per HPI unless specifically indicated above        Objective:    BP 134/64   Pulse 78   Temp 97.7 F (36.5 C) (Oral)   Ht 5\' 5"  (1.651 m)   Wt 155 lb (70.3 kg)   BMI 25.79 kg/m   Wt Readings from Last 3 Encounters:  09/18/16 155 lb (70.3 kg)  07/16/16 176 lb (79.8 kg)  06/25/16 169 lb (76.7 kg)    Physical Exam  Constitutional: She is oriented to person, place, and time. She appears well-developed and well-nourished. No distress.  Eyes: Conjunctivae are normal.  Neck: Neck supple. No thyromegaly present.  Cardiovascular: Normal rate, regular rhythm, normal heart sounds and intact distal pulses.   No murmur heard. Pulmonary/Chest: Effort normal and breath sounds normal.  No respiratory distress. She has no wheezes. She has no rales.  Musculoskeletal: Normal range of motion. She exhibits edema (Trace edema bilateral lower extremities).  Lymphadenopathy:    She has no cervical adenopathy.  Neurological: She is alert and oriented to person, place, and time. Coordination normal.  Skin: Skin is warm and dry. No rash noted. She is not diaphoretic.  Psychiatric: Her behavior is normal. Her mood appears anxious. Cognition and memory are impaired. She exhibits a depressed mood. She expresses no suicidal ideation. She  expresses no suicidal plans.  Nursing note and vitals reviewed.       Assessment & Plan:   Problem List Items Addressed This Visit      Nervous and Auditory   Lewy body dementia   Relevant Orders   Home Health   Face-to-face encounter (required for Medicare/Medicaid patients)   Urinalysis, Complete     Other   Anxiety and depression - Primary       Follow up plan: Return in about 4 weeks (around 10/16/2016), or if symptoms worsen or fail to improve, for Recheck anxiety and depression and dementia.  Counseling provided for all of the vaccine components Orders Placed This Encounter  Procedures  . Urinalysis, Complete  . Home Health  . Face-to-face encounter (required for Medicare/Medicaid patients)    Caryl Pina, MD [facility] 09/18/2016, 2:20 PM

## 2016-09-19 ENCOUNTER — Other Ambulatory Visit: Payer: Self-pay | Admitting: Family Medicine

## 2016-09-19 DIAGNOSIS — N3000 Acute cystitis without hematuria: Secondary | ICD-10-CM

## 2016-09-19 DIAGNOSIS — J449 Chronic obstructive pulmonary disease, unspecified: Secondary | ICD-10-CM | POA: Diagnosis not present

## 2016-09-19 LAB — URINALYSIS, COMPLETE
BILIRUBIN UA: NEGATIVE
GLUCOSE, UA: NEGATIVE
KETONES UA: NEGATIVE
NITRITE UA: NEGATIVE
SPEC GRAV UA: 1.02 (ref 1.005–1.030)
pH, UA: 7 (ref 5.0–7.5)

## 2016-09-19 LAB — MICROSCOPIC EXAMINATION
Renal Epithel, UA: NONE SEEN /hpf
WBC, UA: >30 /hpf — AB (ref 0–?)

## 2016-09-19 MED ORDER — SULFAMETHOXAZOLE-TRIMETHOPRIM 800-160 MG PO TABS: 1.0000 | ORAL_TABLET | Freq: Two times a day (BID) | ORAL | 0 refills | Status: DC

## 2016-09-19 NOTE — Progress Notes (Signed)
It does look like patient ended up with UTI based on the sample that she left. Her WBCs are greater than 30 and RBCs are 11-30 and many bacteria present. I sent in Bactrim for her so she can take it. Caryl Pina, MD [facility] 09/19/2016, 3:38 PM   Pt's daughter notified

## 2016-09-21 DIAGNOSIS — J449 Chronic obstructive pulmonary disease, unspecified: Secondary | ICD-10-CM | POA: Diagnosis not present

## 2016-09-23 ENCOUNTER — Telehealth: Payer: Self-pay

## 2016-09-23 NOTE — Telephone Encounter (Signed)
Daughter called hospice to do referral on mother

## 2016-09-24 DIAGNOSIS — J449 Chronic obstructive pulmonary disease, unspecified: Secondary | ICD-10-CM | POA: Diagnosis not present

## 2016-09-24 DIAGNOSIS — K219 Gastro-esophageal reflux disease without esophagitis: Secondary | ICD-10-CM | POA: Diagnosis not present

## 2016-09-24 DIAGNOSIS — G3183 Dementia with Lewy bodies: Secondary | ICD-10-CM | POA: Diagnosis not present

## 2016-09-24 DIAGNOSIS — R0602 Shortness of breath: Secondary | ICD-10-CM | POA: Diagnosis not present

## 2016-09-24 DIAGNOSIS — E43 Unspecified severe protein-calorie malnutrition: Secondary | ICD-10-CM | POA: Diagnosis not present

## 2016-09-24 DIAGNOSIS — E039 Hypothyroidism, unspecified: Secondary | ICD-10-CM | POA: Diagnosis not present

## 2016-09-24 DIAGNOSIS — H3530 Unspecified macular degeneration: Secondary | ICD-10-CM | POA: Diagnosis not present

## 2016-09-24 DIAGNOSIS — R531 Weakness: Secondary | ICD-10-CM | POA: Diagnosis not present

## 2016-09-24 DIAGNOSIS — I10 Essential (primary) hypertension: Secondary | ICD-10-CM | POA: Diagnosis not present

## 2016-09-24 DIAGNOSIS — R1310 Dysphagia, unspecified: Secondary | ICD-10-CM | POA: Diagnosis not present

## 2016-09-24 DIAGNOSIS — R630 Anorexia: Secondary | ICD-10-CM | POA: Diagnosis not present

## 2016-09-25 DIAGNOSIS — R531 Weakness: Secondary | ICD-10-CM | POA: Diagnosis not present

## 2016-09-25 DIAGNOSIS — R0602 Shortness of breath: Secondary | ICD-10-CM | POA: Diagnosis not present

## 2016-09-25 DIAGNOSIS — E43 Unspecified severe protein-calorie malnutrition: Secondary | ICD-10-CM | POA: Diagnosis not present

## 2016-09-25 DIAGNOSIS — J449 Chronic obstructive pulmonary disease, unspecified: Secondary | ICD-10-CM | POA: Diagnosis not present

## 2016-09-25 DIAGNOSIS — R1310 Dysphagia, unspecified: Secondary | ICD-10-CM | POA: Diagnosis not present

## 2016-09-25 DIAGNOSIS — R630 Anorexia: Secondary | ICD-10-CM | POA: Diagnosis not present

## 2016-09-30 ENCOUNTER — Other Ambulatory Visit: Payer: Self-pay | Admitting: Family Medicine

## 2016-09-30 DIAGNOSIS — R1310 Dysphagia, unspecified: Secondary | ICD-10-CM | POA: Diagnosis not present

## 2016-09-30 DIAGNOSIS — E43 Unspecified severe protein-calorie malnutrition: Secondary | ICD-10-CM | POA: Diagnosis not present

## 2016-09-30 DIAGNOSIS — R531 Weakness: Secondary | ICD-10-CM | POA: Diagnosis not present

## 2016-09-30 DIAGNOSIS — J449 Chronic obstructive pulmonary disease, unspecified: Secondary | ICD-10-CM | POA: Diagnosis not present

## 2016-09-30 DIAGNOSIS — R630 Anorexia: Secondary | ICD-10-CM | POA: Diagnosis not present

## 2016-09-30 DIAGNOSIS — R0602 Shortness of breath: Secondary | ICD-10-CM | POA: Diagnosis not present

## 2016-10-01 ENCOUNTER — Other Ambulatory Visit: Payer: Self-pay | Admitting: *Deleted

## 2016-10-01 NOTE — Telephone Encounter (Signed)
Go ahead and do twice a day when necessary, #[phone]

## 2016-10-01 NOTE — Telephone Encounter (Signed)
Hospice fax request Requested sig one tablet TID prn #[phone]

## 2016-10-01 NOTE — Telephone Encounter (Signed)
She was spacing these out and tapering off, she should be coming off of them, that is why she only got 20 last time.

## 2016-10-01 NOTE — Telephone Encounter (Signed)
We had been tapering her off of this, no refill for this

## 2016-10-03 ENCOUNTER — Ambulatory Visit (INDEPENDENT_AMBULATORY_CARE_PROVIDER_SITE_OTHER): Payer: Medicare Other | Admitting: Family Medicine

## 2016-10-03 DIAGNOSIS — I10 Essential (primary) hypertension: Secondary | ICD-10-CM | POA: Diagnosis not present

## 2016-10-03 DIAGNOSIS — R1310 Dysphagia, unspecified: Secondary | ICD-10-CM | POA: Diagnosis not present

## 2016-10-03 DIAGNOSIS — E43 Unspecified severe protein-calorie malnutrition: Secondary | ICD-10-CM

## 2016-10-03 DIAGNOSIS — R630 Anorexia: Secondary | ICD-10-CM

## 2016-10-03 DIAGNOSIS — R531 Weakness: Secondary | ICD-10-CM

## 2016-10-03 DIAGNOSIS — J449 Chronic obstructive pulmonary disease, unspecified: Secondary | ICD-10-CM | POA: Diagnosis not present

## 2016-10-03 DIAGNOSIS — K219 Gastro-esophageal reflux disease without esophagitis: Secondary | ICD-10-CM | POA: Diagnosis not present

## 2016-10-03 DIAGNOSIS — G3183 Dementia with Lewy bodies: Secondary | ICD-10-CM

## 2016-10-03 DIAGNOSIS — E039 Hypothyroidism, unspecified: Secondary | ICD-10-CM | POA: Diagnosis not present

## 2016-10-03 DIAGNOSIS — H3530 Unspecified macular degeneration: Secondary | ICD-10-CM | POA: Diagnosis not present

## 2016-10-03 DIAGNOSIS — R0602 Shortness of breath: Secondary | ICD-10-CM

## 2016-10-06 ENCOUNTER — Encounter: Payer: Self-pay | Admitting: Family Medicine

## 2016-10-06 ENCOUNTER — Ambulatory Visit: Payer: Medicare Other | Admitting: Family Medicine

## 2016-10-06 DIAGNOSIS — G3183 Dementia with Lewy bodies: Secondary | ICD-10-CM | POA: Diagnosis not present

## 2016-10-06 DIAGNOSIS — H3530 Unspecified macular degeneration: Secondary | ICD-10-CM | POA: Diagnosis not present

## 2016-10-06 DIAGNOSIS — R531 Weakness: Secondary | ICD-10-CM | POA: Diagnosis not present

## 2016-10-06 DIAGNOSIS — I10 Essential (primary) hypertension: Secondary | ICD-10-CM | POA: Diagnosis not present

## 2016-10-06 DIAGNOSIS — R630 Anorexia: Secondary | ICD-10-CM | POA: Diagnosis not present

## 2016-10-06 DIAGNOSIS — K219 Gastro-esophageal reflux disease without esophagitis: Secondary | ICD-10-CM | POA: Diagnosis not present

## 2016-10-06 DIAGNOSIS — J449 Chronic obstructive pulmonary disease, unspecified: Secondary | ICD-10-CM | POA: Diagnosis not present

## 2016-10-06 DIAGNOSIS — E039 Hypothyroidism, unspecified: Secondary | ICD-10-CM | POA: Diagnosis not present

## 2016-10-06 DIAGNOSIS — R1310 Dysphagia, unspecified: Secondary | ICD-10-CM | POA: Diagnosis not present

## 2016-10-06 DIAGNOSIS — E43 Unspecified severe protein-calorie malnutrition: Secondary | ICD-10-CM | POA: Diagnosis not present

## 2016-10-06 DIAGNOSIS — R0602 Shortness of breath: Secondary | ICD-10-CM | POA: Diagnosis not present

## 2016-10-07 ENCOUNTER — Other Ambulatory Visit: Payer: Self-pay | Admitting: Family Medicine

## 2016-10-07 ENCOUNTER — Other Ambulatory Visit: Payer: Self-pay | Admitting: Internal Medicine

## 2016-10-07 DIAGNOSIS — L400 Psoriasis vulgaris: Secondary | ICD-10-CM | POA: Diagnosis not present

## 2016-10-07 DIAGNOSIS — R531 Weakness: Secondary | ICD-10-CM | POA: Diagnosis not present

## 2016-10-07 DIAGNOSIS — J449 Chronic obstructive pulmonary disease, unspecified: Secondary | ICD-10-CM | POA: Diagnosis not present

## 2016-10-07 DIAGNOSIS — C44629 Squamous cell carcinoma of skin of left upper limb, including shoulder: Secondary | ICD-10-CM | POA: Diagnosis not present

## 2016-10-07 DIAGNOSIS — R1310 Dysphagia, unspecified: Secondary | ICD-10-CM | POA: Diagnosis not present

## 2016-10-07 DIAGNOSIS — E43 Unspecified severe protein-calorie malnutrition: Secondary | ICD-10-CM | POA: Diagnosis not present

## 2016-10-07 DIAGNOSIS — R0602 Shortness of breath: Secondary | ICD-10-CM | POA: Diagnosis not present

## 2016-10-07 DIAGNOSIS — Z85828 Personal history of other malignant neoplasm of skin: Secondary | ICD-10-CM | POA: Diagnosis not present

## 2016-10-07 DIAGNOSIS — R630 Anorexia: Secondary | ICD-10-CM | POA: Diagnosis not present

## 2016-10-14 DIAGNOSIS — R0602 Shortness of breath: Secondary | ICD-10-CM | POA: Diagnosis not present

## 2016-10-14 DIAGNOSIS — R531 Weakness: Secondary | ICD-10-CM | POA: Diagnosis not present

## 2016-10-14 DIAGNOSIS — R1310 Dysphagia, unspecified: Secondary | ICD-10-CM | POA: Diagnosis not present

## 2016-10-14 DIAGNOSIS — J449 Chronic obstructive pulmonary disease, unspecified: Secondary | ICD-10-CM | POA: Diagnosis not present

## 2016-10-14 DIAGNOSIS — R630 Anorexia: Secondary | ICD-10-CM | POA: Diagnosis not present

## 2016-10-14 DIAGNOSIS — E43 Unspecified severe protein-calorie malnutrition: Secondary | ICD-10-CM | POA: Diagnosis not present

## 2016-10-15 ENCOUNTER — Ambulatory Visit (INDEPENDENT_AMBULATORY_CARE_PROVIDER_SITE_OTHER): Admitting: Family Medicine

## 2016-10-15 ENCOUNTER — Encounter: Payer: Self-pay | Admitting: Family Medicine

## 2016-10-15 VITALS — BP 114/76 | HR 90 | Temp 97.7°F | Ht 65.0 in | Wt 156.0 lb

## 2016-10-15 DIAGNOSIS — F0281 Dementia in other diseases classified elsewhere with behavioral disturbance: Secondary | ICD-10-CM

## 2016-10-15 DIAGNOSIS — F339 Major depressive disorder, recurrent, unspecified: Secondary | ICD-10-CM | POA: Diagnosis not present

## 2016-10-15 DIAGNOSIS — Z23 Encounter for immunization: Secondary | ICD-10-CM

## 2016-10-15 DIAGNOSIS — G3183 Dementia with Lewy bodies: Secondary | ICD-10-CM

## 2016-10-15 DIAGNOSIS — E039 Hypothyroidism, unspecified: Secondary | ICD-10-CM | POA: Diagnosis not present

## 2016-10-15 DIAGNOSIS — I10 Essential (primary) hypertension: Secondary | ICD-10-CM

## 2016-10-15 DIAGNOSIS — E782 Mixed hyperlipidemia: Secondary | ICD-10-CM | POA: Diagnosis not present

## 2016-10-15 MED ORDER — ALPRAZOLAM 1 MG PO TABS: 1.0000 mg | ORAL_TABLET | Freq: Every day | ORAL | 5 refills | Status: DC

## 2016-10-15 MED ORDER — ARIPIPRAZOLE 5 MG PO TABS: 5.0000 mg | ORAL_TABLET | Freq: Every day | ORAL | 5 refills | Status: DC

## 2016-10-15 NOTE — Progress Notes (Signed)
BP 114/76 (BP Location: Left Arm)   Pulse 90   Temp 97.7 F (36.5 C) (Oral)   Ht 5\' 5"  (1.651 m)   Wt 156 lb (70.8 kg)   SpO2 96% Comment: on 2 liters  BMI 25.96 kg/m    Subjective:    Patient ID: Katie Bryant, female    DOB: April 12, 1928, 83 y.o.   MRN: [id number]  HPI: Katie Bryant is a 83 y.o. female presenting on 10/15/2016 for Hypertension (3 mo); COPD; and Hypothyroidism   HPI Hypertension Patient is currently on metoprolol and valsartan-hydrochlorothiazide, and their blood pressure today is 114/76. Patient denies any lightheadedness or dizziness. Patient denies headaches, blurred vision, chest pains, shortness of breath, or weakness. Denies any side effects from medication and is content with current medication.   Hyperlipidemia Patient is coming in for recheck of his hyperlipidemia. The patient is currently taking fenofibrate and Lipitor. They deny any issues with myalgias or history of liver damage from it. They deny any focal numbness or weakness or chest pain.   Hypothyroidism recheck Patient is coming in for thyroid recheck today as well. They deny any issues with hair changes or heat or cold problems or diarrhea or constipation. They deny any chest pain or palpitations. They are currently on levothyroxine 23micrograms   Lewy body dementia with behavioral disturbance Patient is brought in by daughter today for recheck for dementia and behavioral disturbances. She is currently taking the circle in the morning and Xanax in the evening. Patient is also part of hospice but is thinking about coming out now that she is doing better. She wants home health and we will attempt to get a face-to-face for home health because of her declining mental status  Relevant past medical, surgical, family and social history reviewed and updated as indicated. Interim medical history since our last visit reviewed. Allergies and medications reviewed and updated.  Review of Systems    Constitutional: Negative for chills and fever.  HENT: Negative for congestion, ear discharge and ear pain.   Eyes: Negative for redness and visual disturbance.  Respiratory: Negative for chest tightness and shortness of breath.   Cardiovascular: Negative for chest pain and leg swelling.  Endocrine: Negative for cold intolerance and heat intolerance.  Genitourinary: Negative for difficulty urinating and dysuria.  Musculoskeletal: Negative for back pain and gait problem.  Skin: Negative for rash.  Neurological: Negative for dizziness, weakness, light-headedness and headaches.  Psychiatric/Behavioral: Positive for decreased concentration and dysphoric mood. Negative for agitation, behavioral problems, self-injury, sleep disturbance and suicidal ideas. The patient is nervous/anxious.   All other systems reviewed and are negative.   Per HPI unless specifically indicated above      Objective:    BP 114/76 (BP Location: Left Arm)   Pulse 90   Temp 97.7 F (36.5 C) (Oral)   Ht 5\' 5"  (1.651 m)   Wt 156 lb (70.8 kg)   SpO2 96% Comment: on 2 liters  BMI 25.96 kg/m   Wt Readings from Last 3 Encounters:  10/15/16 156 lb (70.8 kg)  09/18/16 155 lb (70.3 kg)  07/16/16 176 lb (79.8 kg)    Physical Exam  Constitutional: She is oriented to person, place, and time. She appears well-developed and well-nourished. No distress.  Eyes: Conjunctivae are normal.  Cardiovascular: Normal rate, regular rhythm, normal heart sounds and intact distal pulses.   No murmur heard. Pulmonary/Chest: Effort normal and breath sounds normal. No respiratory distress. She has no wheezes. She has no  rales.  Musculoskeletal: Normal range of motion. She exhibits no edema or tenderness.  Neurological: She is alert and oriented to person, place, and time. Coordination normal.  Skin: Skin is warm and dry. No rash noted. She is not diaphoretic.  Psychiatric: Her behavior is normal. Judgment normal. Her mood appears  anxious. Thought content is not paranoid (Patient does not show signs of paranoia today but has off-and-on in the past) and not delusional. Cognition and memory are impaired. She exhibits a depressed mood. She expresses no suicidal ideation. She expresses no suicidal plans. She exhibits abnormal recent memory.  Nursing note and vitals reviewed.       Assessment & Plan:   Problem List Items Addressed This Visit      Cardiovascular and Mediastinum   HTN (hypertension)     Endocrine   Hypothyroidism - Primary     Nervous and Auditory   Lewy body dementia   Relevant Medications   LORazepam (ATIVAN) 0.5 MG tablet   ALPRAZolam (XANAX) 1 MG tablet   ARIPiprazole (ABILIFY) 5 MG tablet     Other   Hyperlipidemia   Depression, recurrent (HCC)   Relevant Medications   LORazepam (ATIVAN) 0.5 MG tablet   ALPRAZolam (XANAX) 1 MG tablet    Other Visit Diagnoses    Encounter for immunization       Relevant Orders   Flu vaccine HIGH DOSE PF (Completed)       Follow up plan: Return in about 6 months (around 04/15/2017), or if symptoms worsen or fail to improve, for Anxiety and depression.  Counseling provided for all of the vaccine components No orders of the defined types were placed in this encounter.   Caryl Pina, MD [facility] 10/15/2016, 3:17 PM

## 2016-10-16 DIAGNOSIS — J449 Chronic obstructive pulmonary disease, unspecified: Secondary | ICD-10-CM | POA: Diagnosis not present

## 2016-10-16 DIAGNOSIS — R0602 Shortness of breath: Secondary | ICD-10-CM | POA: Diagnosis not present

## 2016-10-16 DIAGNOSIS — R531 Weakness: Secondary | ICD-10-CM | POA: Diagnosis not present

## 2016-10-16 DIAGNOSIS — R1310 Dysphagia, unspecified: Secondary | ICD-10-CM | POA: Diagnosis not present

## 2016-10-16 DIAGNOSIS — R630 Anorexia: Secondary | ICD-10-CM | POA: Diagnosis not present

## 2016-10-16 DIAGNOSIS — E43 Unspecified severe protein-calorie malnutrition: Secondary | ICD-10-CM | POA: Diagnosis not present

## 2016-10-21 DIAGNOSIS — J449 Chronic obstructive pulmonary disease, unspecified: Secondary | ICD-10-CM | POA: Diagnosis not present

## 2016-10-21 DIAGNOSIS — R630 Anorexia: Secondary | ICD-10-CM | POA: Diagnosis not present

## 2016-10-21 DIAGNOSIS — R0602 Shortness of breath: Secondary | ICD-10-CM | POA: Diagnosis not present

## 2016-10-21 DIAGNOSIS — R1310 Dysphagia, unspecified: Secondary | ICD-10-CM | POA: Diagnosis not present

## 2016-10-21 DIAGNOSIS — R531 Weakness: Secondary | ICD-10-CM | POA: Diagnosis not present

## 2016-10-21 DIAGNOSIS — E43 Unspecified severe protein-calorie malnutrition: Secondary | ICD-10-CM | POA: Diagnosis not present

## 2016-10-22 DIAGNOSIS — H40053 Ocular hypertension, bilateral: Secondary | ICD-10-CM | POA: Diagnosis not present

## 2016-10-23 ENCOUNTER — Encounter (INDEPENDENT_AMBULATORY_CARE_PROVIDER_SITE_OTHER): Payer: Medicare Other | Admitting: Ophthalmology

## 2016-10-23 DIAGNOSIS — H35033 Hypertensive retinopathy, bilateral: Secondary | ICD-10-CM

## 2016-10-23 DIAGNOSIS — H353111 Nonexudative age-related macular degeneration, right eye, early dry stage: Secondary | ICD-10-CM

## 2016-10-23 DIAGNOSIS — I10 Essential (primary) hypertension: Secondary | ICD-10-CM | POA: Diagnosis not present

## 2016-10-23 DIAGNOSIS — H59033 Cystoid macular edema following cataract surgery, bilateral: Secondary | ICD-10-CM | POA: Diagnosis not present

## 2016-10-23 DIAGNOSIS — H353122 Nonexudative age-related macular degeneration, left eye, intermediate dry stage: Secondary | ICD-10-CM | POA: Diagnosis not present

## 2016-10-23 DIAGNOSIS — H43813 Vitreous degeneration, bilateral: Secondary | ICD-10-CM

## 2016-10-28 ENCOUNTER — Other Ambulatory Visit: Payer: Self-pay | Admitting: Family Medicine

## 2016-10-28 DIAGNOSIS — E43 Unspecified severe protein-calorie malnutrition: Secondary | ICD-10-CM | POA: Diagnosis not present

## 2016-10-28 DIAGNOSIS — R630 Anorexia: Secondary | ICD-10-CM | POA: Diagnosis not present

## 2016-10-28 DIAGNOSIS — R531 Weakness: Secondary | ICD-10-CM | POA: Diagnosis not present

## 2016-10-28 DIAGNOSIS — J449 Chronic obstructive pulmonary disease, unspecified: Secondary | ICD-10-CM | POA: Diagnosis not present

## 2016-10-28 DIAGNOSIS — R1310 Dysphagia, unspecified: Secondary | ICD-10-CM | POA: Diagnosis not present

## 2016-10-28 DIAGNOSIS — R0602 Shortness of breath: Secondary | ICD-10-CM | POA: Diagnosis not present

## 2016-10-29 ENCOUNTER — Telehealth: Payer: Self-pay | Admitting: Family Medicine

## 2016-10-29 NOTE — Telephone Encounter (Signed)
Patient seen Katie Bryant 10/10 and daughter states there is supposed to be a home health referral in. States that the visit in 10/10 was going to count as the home health face to face visit. Please advise and order referral if approved.

## 2016-10-29 NOTE — Telephone Encounter (Signed)
I do not know we put it in 9 been heard anything back can we check on this Jan

## 2016-10-29 NOTE — Telephone Encounter (Signed)
Attempted to contact patient - NA °

## 2016-10-29 NOTE — Telephone Encounter (Signed)
Katie Bryant, will you take a look at this visit on 10/15/16.  She was wanting home health.  Will that visit count as a face to face encounter or do they need to schedule an appointment to come back?  Will you look at this and let them know, and if it will, have Dr. Warrick Parisian enter an order for home health.  If not, will you schedule the patient for a face to face encounter.

## 2016-10-29 NOTE — Telephone Encounter (Signed)
I have not heard anything

## 2016-10-29 NOTE — Telephone Encounter (Signed)
Go ahead and call in a refill

## 2016-10-29 NOTE — Telephone Encounter (Signed)
rx phoned in

## 2016-10-30 NOTE — Telephone Encounter (Signed)
TC back to family Since pt has Hospice they are not eligible to have [facility] simultaneously

## 2016-10-30 NOTE — Telephone Encounter (Signed)
Sending on information to [facility] All information is in chart from 09/18/16

## 2016-10-31 ENCOUNTER — Telehealth: Payer: Self-pay | Admitting: Family Medicine

## 2016-10-31 DIAGNOSIS — R531 Weakness: Secondary | ICD-10-CM | POA: Diagnosis not present

## 2016-10-31 DIAGNOSIS — R1310 Dysphagia, unspecified: Secondary | ICD-10-CM | POA: Diagnosis not present

## 2016-10-31 DIAGNOSIS — E43 Unspecified severe protein-calorie malnutrition: Secondary | ICD-10-CM | POA: Diagnosis not present

## 2016-10-31 DIAGNOSIS — J449 Chronic obstructive pulmonary disease, unspecified: Secondary | ICD-10-CM | POA: Diagnosis not present

## 2016-10-31 DIAGNOSIS — R0602 Shortness of breath: Secondary | ICD-10-CM | POA: Diagnosis not present

## 2016-10-31 DIAGNOSIS — R630 Anorexia: Secondary | ICD-10-CM | POA: Diagnosis not present

## 2016-10-31 NOTE — Telephone Encounter (Signed)
This sounds more like a Gina or Juliann Pulse question, please forward to them so they can get that set up.  We will need to know what dose of oxygen she is on, I believe she is on 2 L nasal cannula

## 2016-11-03 NOTE — Telephone Encounter (Signed)
Mala called in today. They have resended Hospice. Would like to get back in touch with [facility]. Pt is seeing Dr. [name] tomorrow, keeping her oxygen with her current provider and if new oxygen order is needed that he will probably write new order.

## 2016-11-04 ENCOUNTER — Other Ambulatory Visit (INDEPENDENT_AMBULATORY_CARE_PROVIDER_SITE_OTHER): Payer: Medicare Other

## 2016-11-04 ENCOUNTER — Encounter: Payer: Self-pay | Admitting: Internal Medicine

## 2016-11-04 ENCOUNTER — Ambulatory Visit (INDEPENDENT_AMBULATORY_CARE_PROVIDER_SITE_OTHER)
Admission: RE | Admit: 2016-11-04 | Discharge: 2016-11-04 | Disposition: A | Discharge status: Home / Self Care | Payer: Medicare Other | Source: Ambulatory Visit | Attending: Internal Medicine | Admitting: Internal Medicine

## 2016-11-04 ENCOUNTER — Ambulatory Visit (INDEPENDENT_AMBULATORY_CARE_PROVIDER_SITE_OTHER): Admitting: Internal Medicine

## 2016-11-04 VITALS — BP 118/70 | HR 74 | Ht 64.0 in | Wt 156.4 lb

## 2016-11-04 DIAGNOSIS — R0609 Other forms of dyspnea: Secondary | ICD-10-CM

## 2016-11-04 DIAGNOSIS — J449 Chronic obstructive pulmonary disease, unspecified: Secondary | ICD-10-CM

## 2016-11-04 DIAGNOSIS — J9612 Chronic respiratory failure with hypercapnia: Secondary | ICD-10-CM

## 2016-11-04 DIAGNOSIS — R05 Cough: Secondary | ICD-10-CM | POA: Diagnosis not present

## 2016-11-04 DIAGNOSIS — R0602 Shortness of breath: Secondary | ICD-10-CM | POA: Diagnosis not present

## 2016-11-04 LAB — BASIC METABOLIC PANEL
BUN: 11 mg/dL (ref 6–23)
CALCIUM: 9.5 mg/dL (ref 8.4–10.5)
CO2: 32 meq/L (ref 19–32)
CREATININE: 0.68 mg/dL (ref 0.40–1.20)
Chloride: 94 mEq/L — ABNORMAL LOW (ref 96–112)
GFR: 87.19 mL/min (ref >60.00)
GLUCOSE: 89 mg/dL (ref 70–99)
Potassium: 4.1 mEq/L (ref 3.5–5.1)
Sodium: 134 mEq/L — ABNORMAL LOW (ref 135–145)

## 2016-11-04 LAB — HEPATIC FUNCTION PANEL
ALT: 12 U/L (ref 0–35)
AST: 18 U/L (ref 0–37)
Albumin: 3.9 g/dL (ref 3.5–5.2)
Alkaline Phosphatase: 55 U/L (ref 39–117)
BILIRUBIN TOTAL: 0.5 mg/dL (ref 0.2–1.2)
Bilirubin, Direct: 0.1 mg/dL (ref 0.0–0.3)
Total Protein: 6.7 g/dL (ref 6.0–8.3)

## 2016-11-04 LAB — CBC WITH DIFFERENTIAL/PLATELET
BASOS PCT: 1.3 % (ref 0.0–3.0)
Basophils Absolute: 0.1 10*3/uL (ref 0.0–0.1)
EOS ABS: 0.1 10*3/uL (ref 0.0–0.7)
Eosinophils Relative: 1 % (ref 0.0–5.0)
HCT: 36.1 % (ref 36.0–46.0)
Hemoglobin: 11.7 g/dL — ABNORMAL LOW (ref 12.0–15.0)
Lymphocytes Relative: 13.5 % (ref 12.0–46.0)
Lymphs Abs: 1.5 10*3/uL (ref 0.7–4.0)
MCHC: 32.4 g/dL (ref 30.0–36.0)
MCV: 90.8 fl (ref 78.0–100.0)
MONO ABS: 0.9 10*3/uL (ref 0.1–1.0)
Monocytes Relative: 7.7 % (ref 3.0–12.0)
NEUTROS ABS: 8.5 10*3/uL — AB (ref 1.4–7.7)
NEUTROS PCT: 76.5 % (ref 43.0–77.0)
PLATELETS: 290 10*3/uL (ref 150.0–400.0)
RBC: 3.98 Mil/uL (ref 3.87–5.11)
RDW: 13 % (ref 11.5–15.5)
WBC: 11 10*3/uL — AB (ref 4.0–10.5)

## 2016-11-04 LAB — BRAIN NATRIURETIC PEPTIDE: Pro B Natriuretic peptide (BNP): 59 pg/mL (ref 0.0–100.0)

## 2016-11-04 LAB — TSH: TSH: 2.1 u[IU]/mL (ref 0.35–4.50)

## 2016-11-04 NOTE — Progress Notes (Signed)
Subjective:     Patient ID: Katie Bryant, female   DOB: 1928-10-05    MRN: [id number]   Brief patient profile:  29 yowf quit smoking 1992 with GOLD III copd dx 2009 eval in pulm clinic p hospitalized at [facility] 12/21/10   History of Present Illness  09/27/2013 Follow up and Med review  Hx of COPD III/ 02 dep/ has med  Patient returns for followup and medication review. We reviewed all her medications and organized them into a medication calendar with patient education. Does get easily confused with meds. Appears family is helping with meds now.  Recently dx with dementia, now on Namenda. Seems to be some improvement with memory.  rec No change rx/ follow med calendar and bring to each visit/ daughter Katie Bryant    03/06/2015  f/u ov/Katie Bryant re: copd gold III / 02 dep 3lpm x 4 with activity   Chief Complaint  Patient presents with  . Follow-up    Breathing overall doing well. She is using albuterol inhaler almost every day.   doe on 02 = MMRC2 = can't walk a nl pace on a flat grade s sob - does ok at slow pace, flat grade at 4lpm   >>no changes    06/05/2015 Follow up : COPD  Pt returns for 3 month follow up .  Says overall she is doing well. Here with her daughter We reviewed all her meds and organized them into a med calendar with pt education.  She gets meds done at [facility] . They package her pills up in divided sections with 4 time frames and give her weekly packs. Family calls  Pharmacy if there is changes and pharmacy verifies changes with MD  and adjusts packages. Meds are delivered , they pay $15 month for this service.  PVX and Prevnar are utd.  No longer getting nebs thru [facility] , now thru pharmacy bc [facility] contract with her insurance. Not sure if filed on Medicare B at pharmacy.  rec Check with pharmacy to see if nebs if filed on Medicare B (and not Medicare D ) .  Follow med calendar closely and bring to each visit.  Continue on current regimen  Follow up Dr. [name]  In 4  months and As needed      10/16/2015  f/u ov/Katie Bryant re:  Copd 3lpm increase to 4lpm with exertion bud/ duoneb  Chief Complaint  Patient presents with  . Follow-up    Breathing is doing well overall. She uses albuterol 2 x daily on average.   doe = MMRC2 = can't walk a nl pace on a flat grade s sob but does fine slow and flat eg [employer] shopping on 4lpm  rec No change/ follow the med cal   04/28/2016  f/u ov/Katie Bryant re:   COPD III/ 3lpm  Plus duoneb qid and pulmicort bid neb  Chief Complaint  Patient presents with  . Follow-up    Breathing is unchanged.   continues doing some shopping on 02 leaning on cart at [facility] = MMRC2  No need for saba rescue rec No change in medications  Work on inhaler technique:  Only use your salbutin  as a rescue medication   06/05/2016 acute extended ov/Katie Bryant re: copd GOLD III/ 3lpm plus pulm bid and duoneb qid  Chief Complaint  Patient presents with  . Acute Visit    Pt states that she has been waking up with HA and SOB for the past 2 wks. She states she  has also been more SOB with exertion.    main symptom is feels funny in head (points to crown) x sev weeks, not worse in am / no assoc nasal congestion or purulent secretions  Very confused re details of care and constantly arguing with daughter about her capacity to take care of herself Not using meds from action plan portion of med calendar rec [employer] for 02 saturation is 90% or better       11/04/2016  f/u ov/Katie Bryant re:  Copd GOLD III/ ? Increased 02 req ? (not monitoring sats as req) Chief Complaint  Patient presents with  . Follow-up    Pt has leg swelling more in rt leg/foot then left. Pt has sob, wheezing with dry cough. Pt's daughter states pt is not doing well, now living with the daughter  breathing worse since late August Admit to [facility] 09/12/16 for ams "it turned out to be sob  Not adjusting 02 as rec for sats but rather "for how she feels"  Sleeping fine on side but increaesed 02 to 5lpm hs  by daughter  Has med calendar but does not appear to be using Doe = MMRC3 = can't walk 100 yards even at a slow pace at a flat grade s stopping due to sob  Even on 5lpm    No obvious day to day or daytime variability or assoc excess/ purulent sputum or mucus plugs or hemoptysis or cp or chest tightness or overt sinus or hb symptoms. No unusual exp hx or h/o childhood pna/ asthma or knowledge of premature birth.  Sleeping ok horizontal on side on 5lpm  without nocturnal  or early am exacerbation  of respiratory  c/o's or need for noct saba. Also denies any obvious fluctuation of symptoms with weather or environmental changes or other aggravating or alleviating factors except as outlined above   Current Allergies, Complete Past Medical History, Past Surgical History, Family History, and Social History were reviewed in [employer] record.  ROS  The following are not active complaints unless bolded Hoarseness, sore throat, dysphagia, dental problems, itching, sneezing,  nasal congestion or discharge of excess mucus or purulent secretions, ear ache,   fever, chills, sweats, unintended wt loss or wt gain, classically pleuritic or exertional cp,  orthopnea pnd or leg swelling, presyncope, palpitations, abdominal pain, anorexia, nausea, vomiting, diarrhea  or change in bowel habits or change in bladder habits, change in stools or change in urine, dysuria, hematuria,  rash, arthralgias, visual complaints, headache, numbness, weakness or ataxia or problems with walking or coordination,  change in mood/affect or memory.        Current Meds- unable to verify   Medication Sig  . acetaminophen (TYLENOL) 500 MG tablet Take 500 mg by mouth every 6 (six) hours as needed (pain).   Marland Kitchen ALPRAZolam (XANAX) 1 MG tablet Take 1 tablet (1 mg total) by mouth at bedtime.  . ARIPiprazole (ABILIFY) 5 MG tablet Take 1 tablet (5 mg total) by mouth daily.  Marland Kitchen aspirin 81 MG tablet Take 81 mg by mouth daily.   Marland Kitchen atorvastatin (LIPITOR) 40 MG tablet TAKE 1 TABLET DAILY  . brimonidine (ALPHAGAN) 0.2 % ophthalmic solution Place 1 drop into both eyes daily.  . budesonide (PULMICORT) 0.5 MG/2ML nebulizer solution NEBULIZE 1 VIAL TWICE A DAY  . buPROPion (WELLBUTRIN SR) 200 MG 12 hr tablet Take 200 mg by mouth 2 (two) times daily.   . cetirizine (ZYRTEC) 10 MG tablet Take 10 mg by mouth  daily as needed for allergies.  . chlorpheniramine (CHLOR-TRIMETON) 4 MG tablet Take 8 mg by mouth every 6 (six) hours as needed (DRIPPY NOSE). Reported on 03/09/2015  . citalopram (CELEXA) 20 MG tablet Take 1 tablet by mouth daily.  Marland Kitchen dextromethorphan (DELSYM) 30 MG/5ML liquid Take 2 tsp every 12 hours as needed for cough  . dorzolamide (TRUSOPT) 2 % ophthalmic solution Place 1 drop into both eyes 2 (two) times daily.  . fenofibrate micronized (LOFIBRA) 134 MG capsule TAKE (1) CAPSULE DAILY BEFORE BREAKFAST.  Marland Kitchen ipratropium-albuterol (DUONEB) 0.5-2.5 (3) MG/3ML SOLN NEBULIZE 1 VIAL 4 TIMES A DAY  . ketorolac (ACULAR) 0.5 % ophthalmic solution Place 1 drop into both eyes 4 (four) times daily.  Marland Kitchen levothyroxine (SYNTHROID, LEVOTHROID) 50 MCG tablet TAKE 1 TABLET DAILY  . LORazepam (ATIVAN) 0.5 MG tablet TAKE (1) TABLET EVERY THREE HOURS AS NEEDED.  Marland Kitchen LOTEMAX 0.5 % GEL Place 1 drop into both eyes 3 (three) times daily.  . metoprolol tartrate (LOPRESSOR) 25 MG tablet TAKE (1/2) TABLET TWICE DAILY.  . montelukast (SINGULAIR) 10 MG tablet TAKE ONE TABLET AT BEDTIME  . NAMENDA XR 28 MG CP24 24 hr capsule Take 1 capsule (28 mg total) by mouth daily.  . OXYGEN Oxygen 3lpm 24/7, Increase to 4L as needed for shortness of breath with activity  . PROAIR HFA 108 (90 Base) MCG/ACT inhaler INHALE 2 PUFFS BY MOUTH EVERY 6 HOURS AS NEEDED  . ranitidine (ZANTAC) 150 MG tablet TAKE ONE TABLET AT BEDTIME  . Respiratory Therapy Supplies (FLUTTER) DEVI As needed  . RHOPRESSA 0.02 % SOLN Place 1 drop into both eyes at bedtime.  . sodium chloride  (MURO 128) 2 % ophthalmic solution Place 1 drop into both eyes at bedtime.  . sodium chloride (OCEAN) 0.65 % SOLN nasal spray 2 puffs every 4-6 hours as needed for nasal congestion  . valsartan-hydrochlorothiazide (DIOVAN-HCT) 80-12.5 MG tablet TAKE 1 TABLET DAILY  . Vitamin D, Ergocalciferol, (DRISDOL) 50000 units CAPS capsule Take 1 capsule (50,000 Units total) by mouth every 7 (seven) days.                         Objective:  Physical Exam  Chronically ill    amb wf nad tries to answer questions but daughter immediately interupts/ contradicts   Vital signs reviewed - Note on arrival 02 sats  94% on 3lpm continuous     Wt 184 01/21/2011  >08/27/2011 186 > 180 09/29/2011 > 10/07/2011  183 > 11/11/2011  181 > 01/13/2012  183 >180 02/12/2012 > 06/09/2012 188 > 189 10/12/2012 >188 01/14/2013 > 01/21/2013 187 > 03/02/2013 >184 03/18/2013 >  03/28/2013 186> 06/06/2013  182 >  07/18/2013  179 >181 09/27/2013 > 12/08/2013   183 > 02/24/2014  178 > 06/09/2014 177 > 09/07/2014 175 > 12/05/2014   173 > 03/06/2015 173>  10/16/2015   171 >  04/28/2016  167 > 11/04/2016  155   HEENT: nl dentition, turbinates bilaterally, and oropharynx. Nl external ear canals without cough reflex   NECK :  without JVD/Nodes/TM/ nl carotid upstrokes bilaterally   LUNGS: no acc muscle use,   Distant bs with minimal insp and exp rhonchi bilaterally   CV:  RRR  no s3 or murmur or increase in P2, and trace pedal edema R > L   ABD:  soft and nontender with nl inspiratory excursion in the supine position. No bruits or organomegaly appreciated, bowel sounds nl  MS:  Nl gait/ ext warm without deformities, calf tenderness, cyanosis or clubbing No obvious joint restrictions   SKIN: warm and dry without lesions    NEURO:  alert, approp, nl sensorium with  no motor or cerebellar deficits apparent.      CXR PA and Lateral:   11/04/2016 :    I personally reviewed images and agree with radiology impression as follows:    COPD. Possible  superimposed acute bronchitis. No evidence of CHF or alveolar pneumonia. My review: changes are all non-specific but do support moderate copd   Labs ordered/ reviewed:      Chemistry      Component Value Date/Time   NA 134 (L) 11/04/2016 1156   NA 136 06/25/2016 1606   K 4.1 11/04/2016 1156   CL 94 (L) 11/04/2016 1156   CO2 32 11/04/2016 1156   BUN 11 11/04/2016 1156   BUN 17 06/25/2016 1606   CREATININE 0.68 11/04/2016 1156      Component Value Date/Time   CALCIUM 9.5 11/04/2016 1156   ALKPHOS 55 11/04/2016 1156   AST 18 11/04/2016 1156   ALT 12 11/04/2016 1156   BILITOT 0.5 11/04/2016 1156   BILITOT 0.2 06/25/2016 1606        Lab Results  Component Value Date   WBC 11.0 (H) 11/04/2016   HGB 11.7 (L) 11/04/2016   HCT 36.1 11/04/2016   MCV 90.8 11/04/2016   PLT 290.0 11/04/2016       Lab Results  Component Value Date   TSH 2.10 11/04/2016     Lab Results  Component Value Date   PROBNP 59.0 11/04/2016

## 2016-11-04 NOTE — Patient Instructions (Addendum)
Change mucinex / delsym >  mucinex dm up to 1200 mg every 12 hours as needed for cough    See calendar for specific medication instructions and bring it back for each and every office visit for every healthcare provider you see.  Without it,  you may not receive the best quality medical care that we feel you deserve.   Please remember to go to the lab and x-ray department downstairs in the basement  for your tests - we will call you with the results when they are available.    See Tammy NP in 2 weeks (or next available) with all your medications, even over the counter meds, separated in two separate bags, the ones you take no matter what vs the ones you stop once you feel better and take only as needed when you feel you need them.   Tammy  will generate for you a new user friendly medication calendar that will put [place] all on the same page re: your medication use.    Once you have seen Tammy and we are sure that we're all on the same page with your medication use she will arrange follow up with me.  - add:  Consider having advanced do home pulmonary rehab next ov

## 2016-11-05 ENCOUNTER — Other Ambulatory Visit: Payer: Self-pay | Admitting: Family Medicine

## 2016-11-05 NOTE — Progress Notes (Signed)
Spoke with the pt's daughter and notified of results and she verbalized understanding and will inform the pt

## 2016-11-05 NOTE — Assessment & Plan Note (Signed)
-   PFT's 06/11/2007  FEV1   0.70 (34%) ratio 32 with 49% improvement p B2    - HFA 75% effective p coaching 06/20/2011 > 50% 06/24/2011  -Med calendar 09/27/2013 > did not bring as requested 06/09/14 , 06/05/2015 redone  - improved reconciliation 09/07/2014 and 03/07/2015 and 10/16/2015  - 06/05/2016  After extensive coaching HFA effectiveness =    50% from a baseline of 25%    Despite daughters. Negative perspective this patient is actually doing pretty well considering the severity of her COPD. My main concern is that she stay on the medications as they are on the medication calendar both maintenance and as needed and then call me if there is a problem listed on the action plan that is not treated adequately by the action plan medication reading from left to right and a very simple user-friendly fashion which the patient and her daughter still are not using appropriately.  I had an extended discussion with the patient reviewing all relevant studies completed to date and  lasting 15 to 20 minutes of a 25 minute visit    Each maintenance medication was reviewed in detail including most importantly the difference between maintenance and prns and under what circumstances the prns are to be triggered using an action plan format that is not reflected in the computer generated alphabetically organized AVS but trather by a customized med calendar that reflects the AVS meds with confirmed 100% correlation.   In addition, Please see AVS for unique instructions that I personally wrote and verbalized to the the pt in detail and then reviewed with pt  by my nurse highlighting any  changes in therapy recommended at today's visit to their plan of care.

## 2016-11-05 NOTE — Assessment & Plan Note (Signed)
-   sats 90% RA 03/11/2011     - Dr Annamaria Boots started 02 around 2010    - 3 lpm 24/7 except 4lpm with activity as of 03/02/2013     - HC03 trending in low 30's since 2012 so likely hypercarbic component as well   - 02/24/2014   Walked RA x one lap @ 185 stopped due to  desat to 83% corrected on 4lpm    - 12/05/2014   Walked 3lpm pulsed x 2 laps @ 185 ft each stopped due to sob with sat 89% at moderate pace    - 03/06/2015   Walked 4lpm 2 laps @ 185 ft each stopped due to  Sob/ desat to 86% at pace faster than her nl  - 11/04/2016  HCO3  =32      rx as of 11/04/2016  = 3lpm 24/7  And titrate to keep > 90% with activity      Note the action plan does not state increase oxygen for shortness of breath but rather to use the rescue inhalers. She supposed to be doing is adjusting the oxygen for sats greater than 90% as higher levels risk   worsening hypercarbia/ reviewed again today with the patient and her daughter.

## 2016-11-05 NOTE — Progress Notes (Signed)
Daughter notified 

## 2016-11-05 NOTE — Assessment & Plan Note (Addendum)
No evidence of thyroid disease or anemia or congestive heart failure - I do believe she has significant deconditioning in addition to her problems with COPD and chronic respiratory failure and might be a good candidate for rehabilitation.

## 2016-11-06 ENCOUNTER — Telehealth: Payer: Self-pay | Admitting: Family Medicine

## 2016-11-07 ENCOUNTER — Encounter: Payer: Self-pay | Admitting: Pediatrics

## 2016-11-07 ENCOUNTER — Ambulatory Visit (INDEPENDENT_AMBULATORY_CARE_PROVIDER_SITE_OTHER): Payer: Medicare Other | Admitting: Pediatrics

## 2016-11-07 ENCOUNTER — Telehealth: Payer: Self-pay | Admitting: Internal Medicine

## 2016-11-07 VITALS — BP 138/84 | HR 78 | Temp 97.2°F | Ht 64.0 in | Wt 156.0 lb

## 2016-11-07 DIAGNOSIS — N3090 Cystitis, unspecified without hematuria: Secondary | ICD-10-CM

## 2016-11-07 DIAGNOSIS — R399 Unspecified symptoms and signs involving the genitourinary system: Secondary | ICD-10-CM

## 2016-11-07 MED ORDER — SULFAMETHOXAZOLE-TRIMETHOPRIM 800-160 MG PO TABS: 1.0000 | ORAL_TABLET | Freq: Two times a day (BID) | ORAL | 0 refills | Status: DC

## 2016-11-07 NOTE — Telephone Encounter (Signed)
If the dme company doesn't have what it needs and can't get the info from the one she was using then yes needs ov to re-qualify

## 2016-11-07 NOTE — Telephone Encounter (Signed)
Spoke with [employer]. She stated that the patient was seen on 10/30 by MW. She forgot to mention during the OV that they would like to switch DME companies from [place] to [place].   I don't see where patient was walked during her OV.   MW, please advise if this change is ok and if she will need to come back in for a walk (since she is changing DME companies). Thanks!

## 2016-11-07 NOTE — Telephone Encounter (Signed)
Called and spoke with [facility] and she is aware of qualifying walk scheduled for Monday at 10:30.  Order will need to be sent to [place] with the walk results on Monday.  Will forward to LR to follow up on .

## 2016-11-07 NOTE — Progress Notes (Signed)
  Subjective:   Patient ID: Katie Bryant, female    DOB: [birth year], 83 y.o.   MRN: [id number] CC: Burning with urination  HPI: Katie Bryant is a 83 y.o. female presenting for Burning with urination  Started several days ago No fevers Some incontinence at baseline, no worse than usual Appetite has been fine Has been acting her normal self Here today with her daughter No nausea or vomiting No confusion No back or kidney pain  Slight swelling in b/l feet Has been eating a lot of salt recently No SOB more than usual, on chronic oxygen therapy Lives with her daughter  Relevant past medical, surgical, family and social history reviewed. Allergies and medications reviewed and updated. History  Smoking Status  . Former Smoker  . Packs/day: 1.00  . Years: 40.00  . Types: Cigarettes  . Quit date: 02/11/1990  Smokeless Tobacco  . Never Used   ROS: Per HPI   Objective:    BP 138/84   Pulse 78   Temp (!) 97.2 F (36.2 C) (Oral)   Ht 5\' 4"  (1.626 m)   Wt 156 lb (70.8 kg)   BMI 26.78 kg/m   Wt Readings from Last 3 Encounters:  11/07/16 156 lb (70.8 kg)  11/04/16 156 lb 6.4 oz (70.9 kg)  10/15/16 156 lb (70.8 kg)    Gen: NAD, alert, cooperative with exam, NCAT EYES: EOMI, no conjunctival injection, or no icterus ENT:  OP without erythema CV: NRRR, normal S1/S2, no murmur Resp: CTABL, no wheezes, normal WOB Abd: +BS, soft, NTND. no guarding or organomegaly, no CVA tenderness Ext: trace pitting edema dorsum of foot b/l, wearing tight ballet slipper style shoes, no edema in ankles, warm Neuro: Alert and oriented  Assessment & Plan:  Katie Bryant was seen today for burning with urination.  Diagnoses and all orders for this visit:  Cystitis +UA, will f/u culture, start below abx Return precautions discussed -     Urine Culture -     sulfamethoxazole-trimethoprim (BACTRIM DS) 800-160 MG tablet; Take 1 tablet by mouth 2 (two) times daily.  UTI symptoms -     Urinalysis,  Complete  LE swelling Slight  Decrease salt intake, prop feet up when seated  Follow up plan: Return if symptoms worsen or fail to improve. Assunta Found, MD [place]

## 2016-11-08 LAB — URINALYSIS, COMPLETE
Bilirubin, UA: NEGATIVE
Glucose, UA: NEGATIVE
KETONES UA: NEGATIVE
NITRITE UA: NEGATIVE
Protein, UA: NEGATIVE
SPEC GRAV UA: 1.02 (ref 1.005–1.030)
Urobilinogen, Ur: 4 mg/dL — ABNORMAL HIGH (ref 0.2–1.0)
pH, UA: 7 (ref 5.0–7.5)

## 2016-11-08 LAB — MICROSCOPIC EXAMINATION

## 2016-11-09 LAB — URINE CULTURE

## 2016-11-10 ENCOUNTER — Ambulatory Visit: Payer: Medicare Other

## 2016-11-10 NOTE — Telephone Encounter (Signed)
TC to [facility]. Abilify Rx is ok, [name] will contact pharmacy about packaging

## 2016-11-11 ENCOUNTER — Ambulatory Visit (INDEPENDENT_AMBULATORY_CARE_PROVIDER_SITE_OTHER): Payer: Medicare Other | Admitting: *Deleted

## 2016-11-11 ENCOUNTER — Telehealth: Payer: Self-pay | Admitting: Internal Medicine

## 2016-11-11 DIAGNOSIS — J449 Chronic obstructive pulmonary disease, unspecified: Secondary | ICD-10-CM

## 2016-11-11 DIAGNOSIS — J9612 Chronic respiratory failure with hypercapnia: Secondary | ICD-10-CM | POA: Diagnosis not present

## 2016-11-11 NOTE — Telephone Encounter (Signed)
Order has been placed to get the pt changed over to [facility] for her oxygen and nebulizer.

## 2016-11-11 NOTE — Telephone Encounter (Signed)
Patient came in today for qualifying walk. She is wanting to switch from [place] to [place].   Patient was scheduled for a qualifying walk yesterday but fell in the parking lot. She was re-scheduled for today. Today prior to the walk, she stated that she had some hip pain and was nervous about falling again. Advised patient that I would be with her the entire time during the walk for her safety. Below are the results from walk.   MW, please advise if it ok to place an order for her to switch from [place] to [place]. Thanks!   Ambulatory Pulse Oximetry   Row Name  11/11/16 1124             Resting     Supplemental oxygen during test?  No               CP             Resting Heart Rate  65               CP             Resting Sp02  95               CP                            Lap 1 (185 feet)     HR  94               CP             02 Sat  84               CP                            Lap 2 (185 feet)     HR  97               CP             02 Sat  97               CP                            Lap 3 (185 feet)     HR  -             02 Sat  -             Tech Comments:  Patient [facility]- [facility] was sched- uled for a 31mw yesterday but fell in the parking spot. Since then she has been having bilateral hip pain and is nervous about falling again. She was very unsteady on her feet. Patient dropped to 84% after talking about 10 feet. Patient was then placed on 2L of O2,.O2 climbed up to 86%. Moved up to 3L, climbed up to 88%. Mov- ed up to 4L, climbed up to 90%. Moved up to 5L, which is what she uses at home, O2 climbed to 96%. Patient was then able to run around and walk back to the room on 5L. O2 at the end of the test was 97%. Patient denied any SOB while on O2.                CP

## 2016-11-11 NOTE — Telephone Encounter (Signed)
Ok to change to [place]

## 2016-11-12 ENCOUNTER — Ambulatory Visit (INDEPENDENT_AMBULATORY_CARE_PROVIDER_SITE_OTHER): Payer: Medicare Other | Admitting: Family Medicine

## 2016-11-12 ENCOUNTER — Ambulatory Visit (INDEPENDENT_AMBULATORY_CARE_PROVIDER_SITE_OTHER): Payer: Medicare Other

## 2016-11-12 ENCOUNTER — Other Ambulatory Visit: Payer: Self-pay | Admitting: Family Medicine

## 2016-11-12 ENCOUNTER — Encounter: Payer: Self-pay | Admitting: Family Medicine

## 2016-11-12 ENCOUNTER — Ambulatory Visit: Payer: Medicare Other | Admitting: Family Medicine

## 2016-11-12 VITALS — BP 113/68 | HR 67 | Temp 98.3°F | Ht 64.0 in | Wt 156.0 lb

## 2016-11-12 DIAGNOSIS — R296 Repeated falls: Secondary | ICD-10-CM

## 2016-11-12 DIAGNOSIS — S0090XA Unspecified superficial injury of unspecified part of head, initial encounter: Secondary | ICD-10-CM | POA: Diagnosis not present

## 2016-11-12 DIAGNOSIS — R519 Headache, unspecified: Secondary | ICD-10-CM

## 2016-11-12 DIAGNOSIS — M25551 Pain in right hip: Secondary | ICD-10-CM

## 2016-11-12 DIAGNOSIS — R2689 Other abnormalities of gait and mobility: Secondary | ICD-10-CM | POA: Diagnosis not present

## 2016-11-12 DIAGNOSIS — R51 Headache: Secondary | ICD-10-CM | POA: Diagnosis not present

## 2016-11-12 DIAGNOSIS — W19XXXA Unspecified fall, initial encounter: Secondary | ICD-10-CM

## 2016-11-12 NOTE — Progress Notes (Signed)
BP 113/68   Pulse 67   Temp 98.3 F (36.8 C) (Oral)   Ht 5\' 4"  (1.626 m)   Wt 156 lb (70.8 kg)   BMI 26.78 kg/m    Subjective:    Patient ID: Katie Bryant, female    DOB: 02-18-1928, 83 y.o.   MRN: [id number]  HPI: Katie Bryant is a 83 y.o. female presenting on 11/12/2016 for Fall (2 recent falls, hit head and right hip)   HPI Recurrent falls and right hip and head pain Patient is coming in complaining of recurrent falls and right hip and head pain.  She has fallen at least 2 or 3 times over the past few weeks.  She did come in and was treated for UTI and is still on the antibiotic for that and is not this really having symptoms from it.  The culture showed E. coli which was susceptible to the antibiotic that she is currently on.  But the daughter who is here with her and is given most of the history is that she will just be walking along and that always that she will stiffen up and just fell straight backward.  She said that she does not lose consciousness and is completely alert and talking to her through the whole time but just stiffens up loses her balance and falls.  She denies any numbness or weakness but does say from the fall she has right lateral and posterior hip pain along with occipital posterior scalp head pain with a couple of lumps there.  She is able to stand on the hip but as soon as she starts walking it hurts significantly.  The back of her head only hurts to palpation and she denies any nausea or vomiting or headaches or vision changes since the fall.  The most recent fall was yesterday but she also did one a few days ago.  She says she is generally weak but has no focal numbness or weakness  Relevant past medical, surgical, family and social history reviewed and updated as indicated. Interim medical history since our last visit reviewed. Allergies and medications reviewed and updated.  Review of Systems  Constitutional: Negative for chills and fever.  HENT:  Negative for congestion, ear discharge and ear pain.   Eyes: Negative for redness and visual disturbance.  Respiratory: Negative for chest tightness and shortness of breath.   Cardiovascular: Negative for chest pain and leg swelling.  Genitourinary: Negative for difficulty urinating, dysuria, flank pain and frequency.  Musculoskeletal: Negative for back pain and gait problem.  Skin: Negative for rash.  Neurological: Positive for weakness. Negative for dizziness, seizures, speech difficulty, light-headedness, numbness and headaches.  Psychiatric/Behavioral: Positive for confusion. Negative for agitation and behavioral problems.  All other systems reviewed and are negative.   Per HPI unless specifically indicated above     Objective:    BP 113/68   Pulse 67   Temp 98.3 F (36.8 C) (Oral)   Ht 5\' 4"  (1.626 m)   Wt 156 lb (70.8 kg)   BMI 26.78 kg/m   Wt Readings from Last 3 Encounters:  11/12/16 156 lb (70.8 kg)  11/07/16 156 lb (70.8 kg)  11/04/16 156 lb 6.4 oz (70.9 kg)    Physical Exam  Constitutional: She is oriented to person, place, and time. She appears well-developed and well-nourished. No distress.  Eyes: Conjunctivae are normal.  Neck: Neck supple. No thyromegaly present.  Cardiovascular: Normal rate, regular rhythm, normal heart sounds and intact  distal pulses.  No murmur heard. Pulmonary/Chest: Effort normal and breath sounds normal. No respiratory distress. She has no wheezes. She has no rales.  Musculoskeletal: Normal range of motion. She exhibits tenderness (Right lateral hip tenderness, worse with standing on it.  No decreased range of motion or pain with external or internal rotation.  Posterior occipital tenderness over small hematoma). She exhibits no edema.  Lymphadenopathy:    She has no cervical adenopathy.  Neurological: She is alert and oriented to person, place, and time. She exhibits normal muscle tone. Coordination normal.  Skin: Skin is warm and dry.  No rash noted. She is not diaphoretic.  Psychiatric: She has a normal mood and affect. Her behavior is normal.  Nursing note and vitals reviewed.    Right skull x-ray: No acute bony abnormalities, await final read from radiologist  Right hip x-ray: No acute bony abnormalities, await final read from radiologist    Assessment & Plan:   Problem List Items Addressed This Visit    None    Visit Diagnoses    Right hip pain    -  Primary   Relevant Orders   DG HIP UNILAT W OR W/O PELVIS 2-3 VIEWS RIGHT (Completed)   DME Wheelchair manual   Occipital pain       Recurrent falls while walking       Relevant Orders   DME Wheelchair manual   Balance problem       Relevant Orders   DME Wheelchair manual      Patient has home health care coming up tomorrow for evaluation, we will call them and see if they can add on a physical therapy evaluation so that she can possibly do home physical therapy.  Follow up plan: Return if symptoms worsen or fail to improve.  Counseling provided for all of the vaccine components Orders Placed This Encounter  Procedures  . DG HIP UNILAT W OR W/O PELVIS 2-3 VIEWS RIGHT    Caryl Pina, MD [facility] 11/12/2016, 11:13 AM

## 2016-11-13 ENCOUNTER — Telehealth: Payer: Self-pay | Admitting: Internal Medicine

## 2016-11-13 DIAGNOSIS — J449 Chronic obstructive pulmonary disease, unspecified: Secondary | ICD-10-CM | POA: Diagnosis not present

## 2016-11-13 NOTE — Telephone Encounter (Signed)
Spoke with pt's daughter, [name] who states per Huey Romans they are missing information to complete order for O2.  [facility] can you guys help with his. Thanks.

## 2016-11-13 NOTE — Telephone Encounter (Signed)
Spoke to [place] at [place] and the order that was placed did not give liter flow, length of need, etc.  They need a new order.

## 2016-11-13 NOTE — Telephone Encounter (Signed)
Detailed order has been placed on oxygen template. Pt's daughter has been made aware of this and I apologized that this was not taken care of properly. Nothing further was needed.

## 2016-11-13 NOTE — Telephone Encounter (Signed)
CMN has been sent to Dr [name].  If he will complete that then a new order will not be needed.

## 2016-11-17 DIAGNOSIS — E039 Hypothyroidism, unspecified: Secondary | ICD-10-CM | POA: Diagnosis not present

## 2016-11-17 DIAGNOSIS — J449 Chronic obstructive pulmonary disease, unspecified: Secondary | ICD-10-CM | POA: Diagnosis not present

## 2016-11-17 DIAGNOSIS — I10 Essential (primary) hypertension: Secondary | ICD-10-CM | POA: Diagnosis not present

## 2016-11-18 ENCOUNTER — Ambulatory Visit: Payer: Medicare Other | Admitting: Adult Health

## 2016-11-18 ENCOUNTER — Encounter: Payer: Self-pay | Admitting: Adult Health

## 2016-11-18 DIAGNOSIS — J9612 Chronic respiratory failure with hypercapnia: Secondary | ICD-10-CM

## 2016-11-18 DIAGNOSIS — J449 Chronic obstructive pulmonary disease, unspecified: Secondary | ICD-10-CM

## 2016-11-18 DIAGNOSIS — I10 Essential (primary) hypertension: Secondary | ICD-10-CM | POA: Diagnosis not present

## 2016-11-18 DIAGNOSIS — E039 Hypothyroidism, unspecified: Secondary | ICD-10-CM | POA: Diagnosis not present

## 2016-11-18 NOTE — Progress Notes (Signed)
@Patient  ID: Katie Bryant, female    DOB: 1928/05/07, 83 y.o.   MRN: [id number]  Chief Complaint  Patient presents with  . Follow-up    COPD     Referring provider: Dettinger, Fransisca Kaufmann, MD  HPI: 83 yo former smoker followed for gold 3 COPD and oxygen dependent respiratory failure Patient has dementia daughter helps her with her care,   TEST   PFT's 06/11/2007  FEV1   0.70 (34%) ratio 32 with 49% improvement p B2   11/18/2016 Follow up ; COPD /O2 RF /Med Calendar  Patient returns for a 2-week follow-up.  Patient is accompanied with her daughter who helps with her medical care.  We reviewed her medications organized them into a medication calendar with patient education.  It appears that she is taking correctly. She remains on Pulmicort nebulizer twice daily and duo nebulizer 4 times daily She denies any fever or discolored mucus orthopnea or increased edema.  Does have a cough that is minimally productive. Does not like the chlor tabs wants to change to zyrtec .   Patient continues to have shortness of breath with minimal activity.  She remains on oxygen 3 L.  An order for an evaluation of for a portable oxygen concentrator is pending.    No Known Allergies  Immunization History  Administered Date(s) Administered  . Influenza Split 10/07/2010, 09/19/2012, 09/28/2012  . Influenza Whole 10/07/2011  . Influenza, High Dose Seasonal PF 09/23/2013, 10/19/2014, 10/03/2015, 10/15/2016  . Pneumococcal Conjugate-13 10/19/2014  . Pneumococcal Polysaccharide-23 01/06/2009  . Td 03/06/2016    Past Medical History:  Diagnosis Date  . Alzheimer's disease 03/29/2015  . Anxiety   . Aortic stenosis, mild   . Arthritis   . Asthma   . Back pain   . Breast cancer ([place])   . COPD (chronic obstructive pulmonary disease) ([place])   . Dementia   . Depression   . GERD (gastroesophageal reflux disease)   . H/O hiatal hernia   . Hypertension   . Hypothyroidism 06/25/2016  . Lymphadenopathy  10/07/2012  . Obesity   . Oxygen dependent   . Pneumonia   . Right hamstring muscle strain 04/26/2013  . Sleep apnea     Tobacco History: Social History   Tobacco Use  Smoking Status Former Smoker  . Packs/day: 1.00  . Years: 40.00  . Pack years: 40.00  . Types: Cigarettes  . Last attempt to quit: 02/11/1990  . Years since quitting: 26.7  Smokeless Tobacco Never Used   Counseling given: Not Answered   Outpatient Encounter Medications as of 11/18/2016  Medication Sig  . acetaminophen (TYLENOL) 500 MG tablet Take 500 mg by mouth every 6 (six) hours as needed (pain).   Marland Kitchen ALPRAZolam (XANAX) 1 MG tablet Take 1 tablet (1 mg total) by mouth at bedtime.  . ARIPiprazole (ABILIFY) 5 MG tablet Take 1 tablet (5 mg total) by mouth daily.  Marland Kitchen aspirin 81 MG tablet Take 81 mg by mouth daily.  Marland Kitchen atorvastatin (LIPITOR) 40 MG tablet TAKE 1 TABLET DAILY  . brimonidine (ALPHAGAN) 0.2 % ophthalmic solution Place 1 drop into both eyes daily.  . budesonide (PULMICORT) 0.5 MG/2ML nebulizer solution NEBULIZE 1 VIAL TWICE A DAY  . buPROPion (WELLBUTRIN SR) 200 MG 12 hr tablet Take 200 mg by mouth 2 (two) times daily.   . cetirizine (ZYRTEC) 10 MG tablet Take 10 mg by mouth daily as needed for allergies.  . citalopram (CELEXA) 20 MG tablet Take 1 tablet by  mouth daily.  . dorzolamide (TRUSOPT) 2 % ophthalmic solution Place 1 drop into both eyes 2 (two) times daily.  . fenofibrate micronized (LOFIBRA) 134 MG capsule TAKE (1) CAPSULE DAILY BEFORE BREAKFAST.  Marland Kitchen ipratropium-albuterol (DUONEB) 0.5-2.5 (3) MG/3ML SOLN NEBULIZE 1 VIAL 4 TIMES A DAY  . ketorolac (ACULAR) 0.5 % ophthalmic solution Place 1 drop into both eyes 4 (four) times daily.  Marland Kitchen levothyroxine (SYNTHROID, LEVOTHROID) 50 MCG tablet TAKE 1 TABLET DAILY  . LORazepam (ATIVAN) 0.5 MG tablet TAKE (1) TABLET EVERY THREE HOURS AS NEEDED.  Marland Kitchen LOTEMAX 0.5 % GEL Place 1 drop into both eyes 3 (three) times daily.  . metoprolol tartrate (LOPRESSOR) 25 MG  tablet TAKE (1/2) TABLET TWICE DAILY.  . montelukast (SINGULAIR) 10 MG tablet TAKE ONE TABLET AT BEDTIME  . NAMENDA XR 28 MG CP24 24 hr capsule Take 1 capsule (28 mg total) by mouth daily.  . OXYGEN Oxygen 3lpm 24/7, Increase to 4L as needed for shortness of breath with activity  . PROAIR HFA 108 (90 Base) MCG/ACT inhaler INHALE 2 PUFFS BY MOUTH EVERY 6 HOURS AS NEEDED  . ranitidine (ZANTAC) 150 MG tablet TAKE ONE TABLET AT BEDTIME  . Respiratory Therapy Supplies (FLUTTER) DEVI As needed  . RHOPRESSA 0.02 % SOLN Place 1 drop into both eyes at bedtime.  . sodium chloride (MURO 128) 2 % ophthalmic solution Place 1 drop into both eyes at bedtime.  . sodium chloride (OCEAN) 0.65 % SOLN nasal spray 2 puffs every 4-6 hours as needed for nasal congestion  . sulfamethoxazole-trimethoprim (BACTRIM DS) 800-160 MG tablet Take 1 tablet by mouth 2 (two) times daily.  . valsartan-hydrochlorothiazide (DIOVAN-HCT) 80-12.5 MG tablet TAKE 1 TABLET DAILY  . Vitamin D, Ergocalciferol, (DRISDOL) 50000 units CAPS capsule Take 1 capsule (50,000 Units total) by mouth every 7 (seven) days.   No facility-administered encounter medications on file as of 11/18/2016.      Review of Systems  Constitutional:   No  weight loss, night sweats,  Fevers, chills, + fatigue, or  lassitude.  HEENT:   No headaches,  Difficulty swallowing,  Tooth/dental problems, or  Sore throat,                No sneezing, itching, ear ache,  +nasal congestion, post nasal drip,   CV:  No chest pain,  Orthopnea, PND, swelling in lower extremities, anasarca, dizziness, palpitations, syncope.   GI  No heartburn, indigestion, abdominal pain, nausea, vomiting, diarrhea, change in bowel habits, loss of appetite, bloody stools.   Resp:    No chest wall deformity  Skin: no rash or lesions.  GU: no dysuria, change in color of urine, no urgency or frequency.  No flank pain, no hematuria   MS:  No joint pain or swelling.  No decreased range of  motion.  No back pain.    Physical Exam  BP 122/72 (BP Location: Right Arm, Cuff Size: Normal)   Pulse 67   Ht 5\' 4"  (1.626 m)   Wt 156 lb (70.8 kg)   SpO2 98%   BMI 26.78 kg/m   GEN: A/Ox3; pleasant , NAD, elderly in wc , on o2    HEENT:  [place]/AT,  EACs-clear, TMs-wnl, NOSE-clear, THROAT-clear, no lesions, no postnasal drip or exudate noted.   NECK:  Supple w/ fair ROM; no JVD; normal carotid impulses w/o bruits; no thyromegaly or nodules palpated; no lymphadenopathy.    RESP  Decreased BS In bases ,  no accessory muscle use, no dullness  to percussion  CARD:  RRR, no m/r/g, no peripheral edema, pulses intact, no cyanosis or clubbing.  GI:   Soft & nt; nml bowel sounds; no organomegaly or masses detected.   Musco: Warm bil, no deformities or joint swelling noted.   Neuro: alert, no focal deficits noted.    Skin: Warm, no lesions or rashes    Lab Results:  CBC  BMET   BNP No results found for: BNP  ProBNP  Imaging: Dg Skull 1-3 Views  Result Date: 11/12/2016 CLINICAL DATA:  Multiple falls, head injury, initial encounter. EXAM: SKULL - 1-3 VIEW COMPARISON:  None. FINDINGS: No definite fracture.  Paranasal sinuses are grossly clear. IMPRESSION: No definite fracture. If there is continued clinical concern, CT head is recommended. Electronically Signed   By: Lorin Picket M.D.   On: 11/12/2016 11:41   Dg Chest 2 View  Result Date: 11/04/2016 CLINICAL DATA:  Chronic shortness of breath, 4 days of cough. History of asthma, COPD, previous episodes of pneumonia, former smoker. EXAM: CHEST  2 VIEW COMPARISON:  Chest x-ray of August 19, 2016 FINDINGS: The lungs remain hyperinflated with hemidiaphragm flattening. The interstitial markings are coarse and more conspicuous than on the previous study. There is no alveolar infiltrate. The heart and peripheral pulmonary vascularity are within the limits of normal. The central pulmonary vascular prominence persists. There is  calcification in the wall of the thoracic aorta. The bony thorax is unremarkable. IMPRESSION: COPD. Possible superimposed acute bronchitis. No evidence of CHF or alveolar pneumonia. Thoracic aortic atherosclerosis. Electronically Signed   By: David  Martinique M.D.   On: 11/04/2016 14:20   Dg Hip Unilat W Or W/o Pelvis 2-3 Views Right  Result Date: 11/12/2016 CLINICAL DATA:  Several recent falls with pain EXAM: DG HIP (WITH OR WITHOUT PELVIS) 2-3V RIGHT COMPARISON:  None. FINDINGS: Frontal pelvis as well as frontal and lateral right hip images were obtained. No fracture or dislocation. There is mild symmetric narrowing of both hip joints. No erosive change. There is degenerative change with disc space narrowing at L4-5. Sacroiliac joints appear unremarkable. IMPRESSION: Mild symmetric narrowing of both hip joints. No fracture or dislocation. Electronically Signed   By: Lowella Grip III M.D.   On: 11/12/2016 11:38     Assessment & Plan:   COPD GOLD III/ 02 dep  Stable without flare  Patient's medications were reviewed today and patient education was given. Computerized medication calendar was adjusted/completed   Plan  Patient Instructions  May change Chlor tabs to Zyrtec 10mg  At bedtime  .  Follow med calendar closely and bring to each visit.  Continue on current regimen  Follow up Dr. [name]  In 4 months and As needed   Please contact office for sooner follow up if symptoms do not improve or worsen or seek emergency care       Chronic respiratory failure with hypercapnia ([place]) Cont on O2 .      Rexene Edison, NP 11/18/2016

## 2016-11-18 NOTE — Assessment & Plan Note (Signed)
Cont on O2 .  

## 2016-11-18 NOTE — Telephone Encounter (Signed)
Pt seen by TP today for med calendar Daughter Mala frustrated about CMNs for POC Checked with Anita >> MW signed the CMN to change DME companies from [facility] to [facility] but she just recently received the CMN for the POC and it has not yet been signed  CMN located, handed to MW for signature and filled out by my self w/ TP checking my selections CMN given back to Anita for faxing  Called spoke with Mala and informed her of the above She voiced her understanding and denied any further questions/concerns  Will sign off 

## 2016-11-18 NOTE — Patient Instructions (Signed)
May change Chlor tabs to Zyrtec 10mg  At bedtime  .  Follow med calendar closely and bring to each visit.  Continue on current regimen  Follow up Dr. [name]  In 4 months and As needed   Please contact office for sooner follow up if symptoms do not improve or worsen or seek emergency care

## 2016-11-18 NOTE — Assessment & Plan Note (Signed)
Stable without flare  Patient's medications were reviewed today and patient education was given. Computerized medication calendar was adjusted/completed   Plan  Patient Instructions  May change Chlor tabs to Zyrtec 10mg  At bedtime  .  Follow med calendar closely and bring to each visit.  Continue on current regimen  Follow up Dr. [name]  In 4 months and As needed   Please contact office for sooner follow up if symptoms do not improve or worsen or seek emergency care

## 2016-11-19 ENCOUNTER — Other Ambulatory Visit: Payer: Self-pay | Admitting: Family Medicine

## 2016-11-20 NOTE — Progress Notes (Signed)
Chart and office note reviewed in detail  > agree with a/p as outlined    

## 2016-11-21 ENCOUNTER — Telehealth: Payer: Self-pay | Admitting: Internal Medicine

## 2016-11-21 DIAGNOSIS — E039 Hypothyroidism, unspecified: Secondary | ICD-10-CM | POA: Diagnosis not present

## 2016-11-21 DIAGNOSIS — J449 Chronic obstructive pulmonary disease, unspecified: Secondary | ICD-10-CM | POA: Diagnosis not present

## 2016-11-21 DIAGNOSIS — I10 Essential (primary) hypertension: Secondary | ICD-10-CM | POA: Diagnosis not present

## 2016-11-21 NOTE — Telephone Encounter (Signed)
Spoke with [employer]. She stated that the patient has a Helios machine and needs an order for liquid oxygen to be sent to [place]. An order was placed for the patient on 11/8 for the patient to have a POC but she stated that the insurance will not cover this, but will order the liquid oxygen.   MW, please advise if you are ok with [place] placing this order.

## 2016-11-22 NOTE — Telephone Encounter (Signed)
Fine with me

## 2016-11-24 ENCOUNTER — Encounter: Payer: Self-pay | Admitting: Nurse Practitioner

## 2016-11-24 ENCOUNTER — Telehealth: Payer: Self-pay | Admitting: Family Medicine

## 2016-11-24 ENCOUNTER — Ambulatory Visit (INDEPENDENT_AMBULATORY_CARE_PROVIDER_SITE_OTHER): Payer: Medicare Other | Admitting: Nurse Practitioner

## 2016-11-24 VITALS — BP 139/56 | HR 82 | Temp 98.0°F | Resp 20 | Ht 64.0 in

## 2016-11-24 DIAGNOSIS — R05 Cough: Secondary | ICD-10-CM | POA: Diagnosis not present

## 2016-11-24 DIAGNOSIS — J449 Chronic obstructive pulmonary disease, unspecified: Secondary | ICD-10-CM

## 2016-11-24 DIAGNOSIS — R059 Cough, unspecified: Secondary | ICD-10-CM

## 2016-11-24 MED ORDER — AZITHROMYCIN 250 MG PO TABS: ORAL_TABLET | ORAL | 0 refills | Status: DC

## 2016-11-24 NOTE — Telephone Encounter (Signed)
appt made to be seen  

## 2016-11-24 NOTE — Telephone Encounter (Signed)
What symptoms do you have? Cough with a lot of dark color mucas  How long have you been sick? Cough for over a week  Have you been seen for this problem? NO  If your provider decides to give you a prescription, which pharmacy would you like for it to be sent to? [facility]   Patient informed that this information will be sent to the clinical staff for review and that they should receive a follow up call.

## 2016-11-24 NOTE — Telephone Encounter (Signed)
Order placed

## 2016-11-24 NOTE — Addendum Note (Signed)
Addended by: Desmond Dike C on: 11/24/2016 03:34 PM   Modules accepted: Orders

## 2016-11-24 NOTE — Addendum Note (Signed)
Addended by: Jannette Spanner on: 11/24/2016 09:57 AM   Modules accepted: Orders

## 2016-11-24 NOTE — Patient Instructions (Signed)

## 2016-11-24 NOTE — Progress Notes (Signed)
   Subjective:    Patient ID: Katie Bryant, female    DOB: [birth year], 83 y.o.   MRN: [id number]  HPI Patient comes in c/o congestion. Has been coughing for greater then a week. Mucus is greenish grey. No fever. She has very bad COPD and is on oxygen full time. She has had pneumonia several times.    Review of Systems  Constitutional: Negative for appetite change, chills and fever.  HENT: Positive for congestion. Negative for ear pain, sore throat and trouble swallowing.   Respiratory: Positive for cough and shortness of breath.   Cardiovascular: Negative.   Gastrointestinal: Negative.   Genitourinary: Negative.   Neurological: Negative.   Psychiatric/Behavioral: Negative.   All other systems reviewed and are negative.      Objective:   Physical Exam  Constitutional: She is oriented to person, place, and time. She appears well-developed and well-nourished. No distress.  HENT:  Right Ear: Hearing, tympanic membrane, external ear and ear canal normal.  Left Ear: Hearing, tympanic membrane, external ear and ear canal normal.  Nose: Mucosal edema and rhinorrhea present. Right sinus exhibits no maxillary sinus tenderness and no frontal sinus tenderness. Left sinus exhibits no maxillary sinus tenderness and no frontal sinus tenderness.  Mouth/Throat: Uvula is midline, oropharynx is clear and moist and mucous membranes are normal.  Neck: Normal range of motion. Neck supple.  Cardiovascular: Normal rate.  Pulmonary/Chest: Effort normal and breath sounds normal. No respiratory distress. She has no wheezes.  Very diminished breath sounds throughout  Lymphadenopathy:    She has no cervical adenopathy.  Neurological: She is alert and oriented to person, place, and time.  Skin: Skin is warm.  Psychiatric: She has a normal mood and affect. Her behavior is normal. Judgment and thought content normal.    BP (!) 139/56   Pulse 82   Temp 98 F (36.7 C) (Oral)   Resp 20   Ht 5\' 4"  (1.626  m)   SpO2 95%   BMI 26.78 kg/m     Assessment & Plan:   1. COPD GOLD III/ 02 dep    2. Cough    Meds ordered this encounter  Medications  . azithromycin (ZITHROMAX Z-PAK) 250 MG tablet    Sig: As directed    Dispense:  6 tablet    Refill:  0    Order Specific Question:   Supervising Provider    Answer:   Evette Doffing, CAROL L [[id number]]   continue OTC cough meds  humidifier in room RTO prn  Mary-Margaret Hassell Done, FNP

## 2016-11-26 DIAGNOSIS — J449 Chronic obstructive pulmonary disease, unspecified: Secondary | ICD-10-CM | POA: Diagnosis not present

## 2016-11-26 DIAGNOSIS — E039 Hypothyroidism, unspecified: Secondary | ICD-10-CM | POA: Diagnosis not present

## 2016-11-26 DIAGNOSIS — I10 Essential (primary) hypertension: Secondary | ICD-10-CM | POA: Diagnosis not present

## 2016-12-01 DIAGNOSIS — E039 Hypothyroidism, unspecified: Secondary | ICD-10-CM | POA: Diagnosis not present

## 2016-12-01 DIAGNOSIS — I10 Essential (primary) hypertension: Secondary | ICD-10-CM | POA: Diagnosis not present

## 2016-12-01 DIAGNOSIS — J449 Chronic obstructive pulmonary disease, unspecified: Secondary | ICD-10-CM | POA: Diagnosis not present

## 2016-12-02 DIAGNOSIS — I10 Essential (primary) hypertension: Secondary | ICD-10-CM | POA: Diagnosis not present

## 2016-12-02 DIAGNOSIS — E039 Hypothyroidism, unspecified: Secondary | ICD-10-CM | POA: Diagnosis not present

## 2016-12-02 DIAGNOSIS — J449 Chronic obstructive pulmonary disease, unspecified: Secondary | ICD-10-CM | POA: Diagnosis not present

## 2016-12-03 DIAGNOSIS — E039 Hypothyroidism, unspecified: Secondary | ICD-10-CM | POA: Diagnosis not present

## 2016-12-03 DIAGNOSIS — J449 Chronic obstructive pulmonary disease, unspecified: Secondary | ICD-10-CM | POA: Diagnosis not present

## 2016-12-03 DIAGNOSIS — I10 Essential (primary) hypertension: Secondary | ICD-10-CM | POA: Diagnosis not present

## 2016-12-08 ENCOUNTER — Ambulatory Visit (INDEPENDENT_AMBULATORY_CARE_PROVIDER_SITE_OTHER): Payer: Medicare Other | Admitting: Family Medicine

## 2016-12-08 DIAGNOSIS — E039 Hypothyroidism, unspecified: Secondary | ICD-10-CM

## 2016-12-08 DIAGNOSIS — I10 Essential (primary) hypertension: Secondary | ICD-10-CM | POA: Diagnosis not present

## 2016-12-08 DIAGNOSIS — Z9981 Dependence on supplemental oxygen: Secondary | ICD-10-CM

## 2016-12-08 DIAGNOSIS — G3183 Dementia with Lewy bodies: Secondary | ICD-10-CM | POA: Diagnosis not present

## 2016-12-08 DIAGNOSIS — F0281 Dementia in other diseases classified elsewhere with behavioral disturbance: Secondary | ICD-10-CM | POA: Diagnosis not present

## 2016-12-08 DIAGNOSIS — F339 Major depressive disorder, recurrent, unspecified: Secondary | ICD-10-CM | POA: Diagnosis not present

## 2016-12-08 DIAGNOSIS — J449 Chronic obstructive pulmonary disease, unspecified: Secondary | ICD-10-CM

## 2016-12-08 DIAGNOSIS — F419 Anxiety disorder, unspecified: Secondary | ICD-10-CM

## 2016-12-10 DIAGNOSIS — M25552 Pain in left hip: Secondary | ICD-10-CM | POA: Diagnosis not present

## 2016-12-11 DIAGNOSIS — J449 Chronic obstructive pulmonary disease, unspecified: Secondary | ICD-10-CM | POA: Diagnosis not present

## 2016-12-11 DIAGNOSIS — I10 Essential (primary) hypertension: Secondary | ICD-10-CM | POA: Diagnosis not present

## 2016-12-11 DIAGNOSIS — E039 Hypothyroidism, unspecified: Secondary | ICD-10-CM | POA: Diagnosis not present

## 2016-12-12 DIAGNOSIS — E039 Hypothyroidism, unspecified: Secondary | ICD-10-CM | POA: Diagnosis not present

## 2016-12-12 DIAGNOSIS — J449 Chronic obstructive pulmonary disease, unspecified: Secondary | ICD-10-CM | POA: Diagnosis not present

## 2016-12-12 DIAGNOSIS — I10 Essential (primary) hypertension: Secondary | ICD-10-CM | POA: Diagnosis not present

## 2016-12-13 DIAGNOSIS — J449 Chronic obstructive pulmonary disease, unspecified: Secondary | ICD-10-CM | POA: Diagnosis not present

## 2016-12-17 DIAGNOSIS — E039 Hypothyroidism, unspecified: Secondary | ICD-10-CM | POA: Diagnosis not present

## 2016-12-17 DIAGNOSIS — I10 Essential (primary) hypertension: Secondary | ICD-10-CM | POA: Diagnosis not present

## 2016-12-17 DIAGNOSIS — J449 Chronic obstructive pulmonary disease, unspecified: Secondary | ICD-10-CM | POA: Diagnosis not present

## 2016-12-18 DIAGNOSIS — I10 Essential (primary) hypertension: Secondary | ICD-10-CM | POA: Diagnosis not present

## 2016-12-18 DIAGNOSIS — J449 Chronic obstructive pulmonary disease, unspecified: Secondary | ICD-10-CM | POA: Diagnosis not present

## 2016-12-18 DIAGNOSIS — E039 Hypothyroidism, unspecified: Secondary | ICD-10-CM | POA: Diagnosis not present

## 2016-12-19 ENCOUNTER — Telehealth: Payer: Self-pay | Admitting: *Deleted

## 2016-12-19 DIAGNOSIS — E039 Hypothyroidism, unspecified: Secondary | ICD-10-CM | POA: Diagnosis not present

## 2016-12-19 DIAGNOSIS — J449 Chronic obstructive pulmonary disease, unspecified: Secondary | ICD-10-CM | POA: Diagnosis not present

## 2016-12-19 DIAGNOSIS — I10 Essential (primary) hypertension: Secondary | ICD-10-CM | POA: Diagnosis not present

## 2016-12-22 ENCOUNTER — Other Ambulatory Visit: Payer: Self-pay | Admitting: Family Medicine

## 2016-12-22 ENCOUNTER — Other Ambulatory Visit: Payer: Self-pay | Admitting: *Deleted

## 2016-12-22 DIAGNOSIS — J449 Chronic obstructive pulmonary disease, unspecified: Secondary | ICD-10-CM | POA: Diagnosis not present

## 2016-12-22 DIAGNOSIS — I10 Essential (primary) hypertension: Secondary | ICD-10-CM | POA: Diagnosis not present

## 2016-12-22 DIAGNOSIS — E039 Hypothyroidism, unspecified: Secondary | ICD-10-CM | POA: Diagnosis not present

## 2016-12-22 MED ORDER — BUPROPION HCL ER (SR) 200 MG PO TB12: 200.0000 mg | ORAL_TABLET | Freq: Two times a day (BID) | ORAL | 1 refills | Status: DC

## 2016-12-22 NOTE — Telephone Encounter (Signed)
Go ahead and send a 61-month refill of the Wellbutrin at the dose that we have on record

## 2016-12-22 NOTE — Telephone Encounter (Signed)
What medication is she asking for, this note does not have the medication

## 2016-12-22 NOTE — Telephone Encounter (Signed)
Aware.  Wellbutrin was sent in.

## 2016-12-23 DIAGNOSIS — H40033 Anatomical narrow angle, bilateral: Secondary | ICD-10-CM | POA: Diagnosis not present

## 2016-12-23 DIAGNOSIS — G44219 Episodic tension-type headache, not intractable: Secondary | ICD-10-CM | POA: Diagnosis not present

## 2016-12-24 ENCOUNTER — Telehealth: Payer: Self-pay | Admitting: Cardiology

## 2016-12-24 DIAGNOSIS — E039 Hypothyroidism, unspecified: Secondary | ICD-10-CM | POA: Diagnosis not present

## 2016-12-24 DIAGNOSIS — I10 Essential (primary) hypertension: Secondary | ICD-10-CM | POA: Diagnosis not present

## 2016-12-24 DIAGNOSIS — J449 Chronic obstructive pulmonary disease, unspecified: Secondary | ICD-10-CM | POA: Diagnosis not present

## 2016-12-24 NOTE — Telephone Encounter (Signed)
New message  Patient daughter calling with concerns of swelling for about 2 months. Please call  Pt c/o swelling: STAT is pt has developed SOB within 24 hours  1) How much weight have you gained and in what time span? n/a  2) If swelling, where is the swelling located? feet, ankles  3) Are you currently taking a fluid pill? No  4) Are you currently SOB? No  5) Do you have a log of your daily weights (if so, list)? 150 last week  6) Have you gained 3 pounds in a day or 5 pounds in a week? n/a  7) Have you traveled recently? No

## 2016-12-24 NOTE — Telephone Encounter (Signed)
Spoke with pt dtr, she noticed the swelling in the patients feet in September and has been able to control it some with watching sodium and elevating the legs but it seems to be getting worse. The edema in the morning is not as bad as in the afternoon. The right foot is worse than the left, she is not able to wear all of her shoes. She has some SOB but feels that it maybe a little better. She has a dry cough. The patient is up 1 lb since last week, but they do not weigh daily. They are asking about getting a fluid pill, follow up scheduled. Encouraged the dtr to also use the compression hose during the day.

## 2016-12-26 DIAGNOSIS — I10 Essential (primary) hypertension: Secondary | ICD-10-CM | POA: Diagnosis not present

## 2016-12-26 DIAGNOSIS — J449 Chronic obstructive pulmonary disease, unspecified: Secondary | ICD-10-CM | POA: Diagnosis not present

## 2016-12-26 DIAGNOSIS — E039 Hypothyroidism, unspecified: Secondary | ICD-10-CM | POA: Diagnosis not present

## 2016-12-29 DIAGNOSIS — J189 Pneumonia, unspecified organism: Secondary | ICD-10-CM | POA: Insufficient documentation

## 2016-12-29 DIAGNOSIS — I350 Nonrheumatic aortic (valve) stenosis: Secondary | ICD-10-CM | POA: Insufficient documentation

## 2016-12-29 DIAGNOSIS — G4730 Sleep apnea, unspecified: Secondary | ICD-10-CM | POA: Insufficient documentation

## 2016-12-29 DIAGNOSIS — K219 Gastro-esophageal reflux disease without esophagitis: Secondary | ICD-10-CM | POA: Insufficient documentation

## 2016-12-29 DIAGNOSIS — Z8719 Personal history of other diseases of the digestive system: Secondary | ICD-10-CM | POA: Insufficient documentation

## 2016-12-29 DIAGNOSIS — Z9981 Dependence on supplemental oxygen: Secondary | ICD-10-CM | POA: Insufficient documentation

## 2016-12-29 DIAGNOSIS — M1990 Unspecified osteoarthritis, unspecified site: Secondary | ICD-10-CM | POA: Insufficient documentation

## 2016-12-29 DIAGNOSIS — C50919 Malignant neoplasm of unspecified site of unspecified female breast: Secondary | ICD-10-CM | POA: Insufficient documentation

## 2016-12-29 DIAGNOSIS — F329 Major depressive disorder, single episode, unspecified: Secondary | ICD-10-CM | POA: Insufficient documentation

## 2016-12-29 DIAGNOSIS — J449 Chronic obstructive pulmonary disease, unspecified: Secondary | ICD-10-CM | POA: Insufficient documentation

## 2016-12-29 DIAGNOSIS — F32A Depression, unspecified: Secondary | ICD-10-CM | POA: Insufficient documentation

## 2016-12-29 DIAGNOSIS — E669 Obesity, unspecified: Secondary | ICD-10-CM | POA: Insufficient documentation

## 2016-12-29 DIAGNOSIS — F419 Anxiety disorder, unspecified: Secondary | ICD-10-CM | POA: Insufficient documentation

## 2016-12-29 DIAGNOSIS — M549 Dorsalgia, unspecified: Secondary | ICD-10-CM | POA: Insufficient documentation

## 2016-12-29 DIAGNOSIS — I10 Essential (primary) hypertension: Secondary | ICD-10-CM | POA: Insufficient documentation

## 2016-12-31 ENCOUNTER — Other Ambulatory Visit: Payer: Self-pay

## 2016-12-31 MED ORDER — MEMANTINE HCL ER 28 MG PO CP24: 28.0000 mg | ORAL_CAPSULE | Freq: Every day | ORAL | 2 refills | Status: DC

## 2017-01-01 ENCOUNTER — Encounter: Payer: Self-pay | Admitting: Cardiology

## 2017-01-01 ENCOUNTER — Other Ambulatory Visit: Payer: Self-pay

## 2017-01-01 ENCOUNTER — Ambulatory Visit: Payer: Medicare Other | Admitting: Cardiology

## 2017-01-01 ENCOUNTER — Ambulatory Visit: Payer: Self-pay | Admitting: Cardiology

## 2017-01-01 ENCOUNTER — Ambulatory Visit (HOSPITAL_COMMUNITY): Payer: Medicare Other | Attending: Cardiovascular Disease

## 2017-01-01 DIAGNOSIS — I10 Essential (primary) hypertension: Secondary | ICD-10-CM | POA: Diagnosis not present

## 2017-01-01 DIAGNOSIS — R296 Repeated falls: Secondary | ICD-10-CM

## 2017-01-01 DIAGNOSIS — I350 Nonrheumatic aortic (valve) stenosis: Secondary | ICD-10-CM

## 2017-01-01 DIAGNOSIS — G4733 Obstructive sleep apnea (adult) (pediatric): Secondary | ICD-10-CM | POA: Diagnosis not present

## 2017-01-01 DIAGNOSIS — J449 Chronic obstructive pulmonary disease, unspecified: Secondary | ICD-10-CM | POA: Diagnosis not present

## 2017-01-01 DIAGNOSIS — I6523 Occlusion and stenosis of bilateral carotid arteries: Secondary | ICD-10-CM

## 2017-01-01 DIAGNOSIS — Z9010 Acquired absence of unspecified breast and nipple: Secondary | ICD-10-CM | POA: Diagnosis not present

## 2017-01-01 DIAGNOSIS — E669 Obesity, unspecified: Secondary | ICD-10-CM | POA: Insufficient documentation

## 2017-01-01 DIAGNOSIS — Z853 Personal history of malignant neoplasm of breast: Secondary | ICD-10-CM | POA: Diagnosis not present

## 2017-01-01 DIAGNOSIS — I779 Disorder of arteries and arterioles, unspecified: Secondary | ICD-10-CM | POA: Insufficient documentation

## 2017-01-01 DIAGNOSIS — Z87891 Personal history of nicotine dependence: Secondary | ICD-10-CM | POA: Insufficient documentation

## 2017-01-01 DIAGNOSIS — R600 Localized edema: Secondary | ICD-10-CM | POA: Insufficient documentation

## 2017-01-01 DIAGNOSIS — I739 Peripheral vascular disease, unspecified: Secondary | ICD-10-CM

## 2017-01-01 DIAGNOSIS — Z6826 Body mass index (BMI) 26.0-26.9, adult: Secondary | ICD-10-CM | POA: Diagnosis not present

## 2017-01-01 DIAGNOSIS — Z9981 Dependence on supplemental oxygen: Secondary | ICD-10-CM | POA: Diagnosis not present

## 2017-01-01 DIAGNOSIS — J439 Emphysema, unspecified: Secondary | ICD-10-CM | POA: Diagnosis not present

## 2017-01-01 DIAGNOSIS — I119 Hypertensive heart disease without heart failure: Secondary | ICD-10-CM | POA: Diagnosis not present

## 2017-01-01 HISTORY — DX: Disorder of arteries and arterioles, unspecified: I77.9

## 2017-01-01 LAB — ECHOCARDIOGRAM COMPLETE
Height: 64 in
Weight: 2483.2 oz

## 2017-01-01 NOTE — Assessment & Plan Note (Signed)
Pt's daughter brought her in for evaluation for edema in her feet for the past few weeks.

## 2017-01-01 NOTE — Assessment & Plan Note (Signed)
Mild by echo 2013

## 2017-01-01 NOTE — Assessment & Plan Note (Addendum)
Repeat B/P by me 152/68

## 2017-01-01 NOTE — Assessment & Plan Note (Signed)
Home O2

## 2017-01-01 NOTE — Patient Instructions (Signed)
Medication Instructions:  NO CHANGES  If you need a refill on your cardiac medications before your next appointment, please call your pharmacy.  Testing/Procedures: Your physician has requested that you have an echocardiogram-TODAY AT [place]. Echocardiography is a painless test that uses sound waves to create images of your heart. It provides your doctor with information about the size and shape of your heart and how well your heart's chambers and valves are working. This procedure takes approximately one hour. There are no restrictions for this procedure.  Follow-Up: WE WILL CALL YOU AFTER ECHO.  Thank you for choosing [facility] at [facility]!!

## 2017-01-01 NOTE — Assessment & Plan Note (Signed)
Chronic 24 hr home O2

## 2017-01-01 NOTE — Progress Notes (Signed)
01/01/2017 Katie Bryant   [birth year]  [id number]  Primary Physician Dettinger, Fransisca Kaufmann, MD Primary Cardiologist: Dr Sallyanne Kuster  HPI:  83 y/o female followed by Dr Percival Spanish with a history of mild AS by echo July 2013, severe COPD on chronic 24 hr O2, mild carotid disease, and HTN. Her LOV with Dr Percival Spanish was May 2017, she is followed at [facility]. The pt was living alone but moved in with her daughter in Aug 2018. Her daughter brought her in today because of edema in her mothers feet. This has been present for the past couple of weeks. The pt is always SOB. Her wgt has been stable- 150-155 lbs- since she moved in with her daughter.    Current Outpatient Medications  Medication Sig Dispense Refill  . acetaminophen (TYLENOL) 500 MG tablet Take 500 mg by mouth every 6 (six) hours as needed (pain).     Marland Kitchen ALPRAZolam (XANAX) 1 MG tablet Take 1 tablet (1 mg total) by mouth at bedtime. 30 tablet 5  . ARIPiprazole (ABILIFY) 5 MG tablet Take 1 tablet (5 mg total) by mouth daily. 30 tablet 5  . aspirin 81 MG tablet Take 81 mg by mouth daily.    Marland Kitchen atorvastatin (LIPITOR) 40 MG tablet TAKE 1 TABLET DAILY 90 tablet 0  . brimonidine (ALPHAGAN) 0.2 % ophthalmic solution Place 1 drop into both eyes daily.    . budesonide (PULMICORT) 0.5 MG/2ML nebulizer solution NEBULIZE 1 VIAL TWICE A DAY 120 mL 11  . buPROPion (WELLBUTRIN SR) 200 MG 12 hr tablet Take 1 tablet (200 mg total) by mouth 2 (two) times daily. 180 tablet 1  . cetirizine (ZYRTEC) 10 MG tablet Take 10 mg by mouth daily as needed for allergies.    . citalopram (CELEXA) 20 MG tablet Take 1 tablet by mouth daily.    Marland Kitchen dextromethorphan (DELSYM) 30 MG/5ML liquid Take 30 mg 2 (two) times daily as needed by mouth for cough.    . dextromethorphan-guaiFENesin (MUCINEX DM) 30-600 MG 12hr tablet Take 1 tablet 2 (two) times daily as needed by mouth for cough.    . dorzolamide (TRUSOPT) 2 % ophthalmic solution Place 1 drop into both eyes 2 (two) times daily.     . fenofibrate micronized (LOFIBRA) 134 MG capsule TAKE (1) CAPSULE DAILY BEFORE BREAKFAST. 30 capsule 11  . ipratropium-albuterol (DUONEB) 0.5-2.5 (3) MG/3ML SOLN NEBULIZE 1 VIAL 4 TIMES A DAY 360 mL 11  . ketorolac (ACULAR) 0.5 % ophthalmic solution Place 1 drop into both eyes 4 (four) times daily.    Marland Kitchen levothyroxine (SYNTHROID, LEVOTHROID) 50 MCG tablet TAKE 1 TABLET DAILY 30 tablet 11  . LORazepam (ATIVAN) 0.5 MG tablet Take 0.5 mg 2 (two) times daily as needed by mouth for anxiety.    . meclizine (ANTIVERT) 25 MG tablet Take 25 mg every 8 (eight) hours as needed by mouth for dizziness.    . memantine (NAMENDA XR) 28 MG CP24 24 hr capsule Take 1 capsule (28 mg total) by mouth daily. 30 capsule 2  . metoprolol tartrate (LOPRESSOR) 25 MG tablet TAKE (1/2) TABLET TWICE DAILY. 15 tablet 9  . montelukast (SINGULAIR) 10 MG tablet TAKE ONE TABLET AT BEDTIME 30 tablet 11  . OXYGEN Oxygen 3lpm 24/7, Increase to 4L as needed for shortness of breath with activity    . PROAIR HFA 108 (90 Base) MCG/ACT inhaler INHALE 2 PUFFS BY MOUTH EVERY 6 HOURS AS NEEDED 8.5 g 5  . ranitidine (ZANTAC) 150 MG tablet TAKE  ONE TABLET AT BEDTIME 30 tablet 6  . Respiratory Therapy Supplies (FLUTTER) DEVI As needed    . RHOPRESSA 0.02 % SOLN Place 1 drop into both eyes at bedtime.    . sodium chloride (MURO 128) 2 % ophthalmic solution Place 1 drop into both eyes at bedtime.    . sodium chloride (OCEAN) 0.65 % SOLN nasal spray 2 puffs every 4-6 hours as needed for nasal congestion    . valsartan-hydrochlorothiazide (DIOVAN-HCT) 80-12.5 MG tablet TAKE 1 TABLET DAILY 90 tablet 0  . Vitamin D, Ergocalciferol, (DRISDOL) 50000 units CAPS capsule Take 1 capsule (50,000 Units total) by mouth every 7 (seven) days. 12 capsule 0   No current facility-administered medications for this visit.     No Known Allergies  Past Medical History:  Diagnosis Date  . Alzheimer's disease 03/29/2015  . Anxiety   . Aortic stenosis, mild   .  Arthritis   . Asthma   . Back pain   . Breast cancer ([place])   . Carotid artery disease ([place]) 01/01/2017  . COPD (chronic obstructive pulmonary disease) ([place])   . Dementia   . Depression   . GERD (gastroesophageal reflux disease)   . H/O hiatal hernia   . Hypertension   . Hypothyroidism 06/25/2016  . Lymphadenopathy 10/07/2012  . Obesity   . Oxygen dependent   . Pneumonia   . Right hamstring muscle strain 04/26/2013  . Sleep apnea     Social History   Socioeconomic History  . Marital status: Widowed    Spouse name: Not on file  . Number of children: Not on file  . Years of education: Not on file  . Highest education level: Not on file  Social Needs  . Financial resource strain: Not on file  . Food insecurity - worry: Not on file  . Food insecurity - inability: Not on file  . Transportation needs - medical: Not on file  . Transportation needs - non-medical: Not on file  Occupational History  . Not on file  Tobacco Use  . Smoking status: Former Smoker    Packs/day: 1.00    Years: 40.00    Pack years: 40.00    Types: Cigarettes    Last attempt to quit: 02/11/1990    Years since quitting: 26.9  . Smokeless tobacco: Never Used  Substance and Sexual Activity  . Alcohol use: Yes    Alcohol/week: 4.2 oz    Types: 7 Glasses of wine per week  . Drug use: No  . Sexual activity: Not Currently  Other Topics Concern  . Not on file  Social History Narrative  . Not on file     Family History  Problem Relation Age of Onset  . Tuberculosis Mother   . Lung cancer Father      Review of Systems: General: negative for chills, fever, night sweats or weight changes.  Cardiovascular: negative for chest pain, dyspnea on exertion, edema, orthopnea, palpitations, paroxysmal nocturnal dyspnea or shortness of breath Dermatological: negative for rash Respiratory: negative for cough or wheezing Urologic: negative for hematuria Abdominal: negative for nausea, vomiting, diarrhea, bright  red blood per rectum, melena, or hematemesis Neurologic: negative for visual changes, syncope, or dizziness All other systems reviewed and are otherwise negative except as noted above.    Blood pressure (!) 183/69, pulse 74, height 5\' 4"  (1.626 m), weight 155 lb 3.2 oz (70.4 kg), SpO2 96 %.  General appearance: alert, cooperative, no distress and chronically ill appearing, on O2 Neck:  no JVD Lungs: dereased breath sounds Heart: regular rate and rhythm and soft systolic murmur AOV, overall diminnished heart sounds Extremities: trace edema bilateral LE-ankles and feet Skin: Skin color, texture, turgor normal. No rashes or lesions Neurologic: Grossly normal  EKG [id number]  ASSESSMENT AND PLAN:   Aortic stenosis, mild Mild by echo 2013  COPD (chronic obstructive pulmonary disease) (HCC) Home O2  Hypertension Repeat B/P by me 152/68  Carotid artery disease ([place]) "mild" in June 2017  Edema of both feet Pt's daughter brought her in for evaluation for edema in her feet for the past few weeks.   Oxygen dependent Chronic 24 hr home O2   PLAN  I'm hesitant to make any medication adjustments based on her exam today. The "swelling" in her feet is not that impressive. I'll check an echo to evaluate her AS and also give [place] an idea of her Rt heart function with her severe COPD. She may just need additional diuretic for a couple doses.   Kerin Ransom PA-C 01/01/2017 11:30 AM

## 2017-01-01 NOTE — Assessment & Plan Note (Signed)
"  mild" in June 2017

## 2017-01-02 DIAGNOSIS — E039 Hypothyroidism, unspecified: Secondary | ICD-10-CM | POA: Diagnosis not present

## 2017-01-02 DIAGNOSIS — J449 Chronic obstructive pulmonary disease, unspecified: Secondary | ICD-10-CM | POA: Diagnosis not present

## 2017-01-02 DIAGNOSIS — I10 Essential (primary) hypertension: Secondary | ICD-10-CM | POA: Diagnosis not present

## 2017-01-05 ENCOUNTER — Telehealth: Payer: Self-pay | Admitting: Cardiology

## 2017-01-05 DIAGNOSIS — I10 Essential (primary) hypertension: Secondary | ICD-10-CM | POA: Diagnosis not present

## 2017-01-05 DIAGNOSIS — J449 Chronic obstructive pulmonary disease, unspecified: Secondary | ICD-10-CM | POA: Diagnosis not present

## 2017-01-05 DIAGNOSIS — E039 Hypothyroidism, unspecified: Secondary | ICD-10-CM | POA: Diagnosis not present

## 2017-01-05 MED ORDER — HYDROCHLOROTHIAZIDE 12.5 MG PO CAPS: 12.5000 mg | ORAL_CAPSULE | Freq: Every day | ORAL | 3 refills | Status: DC

## 2017-01-05 NOTE — Telephone Encounter (Signed)
Returned call to daughter (ok per DPR), made aware of results.   Daughter verbalized understanding but states PA was possibly going to make some changes and/or recommendations about patients swelling.   Reports patient is still experiencing swelling in LE R>L, no change in SOB, unable to report weights but states it continues to be stable.     Per last OV note: PLAN  I'm hesitant to make any medication adjustments based on her exam today. The "swelling" in her feet is not that impressive. I'll check an echo to evaluate her AS and also give [place] an idea of her Rt heart function with her severe COPD. She may just need additional diuretic for a couple doses.    Advised I would route to PA for review and further recommendations based on results.    Daughter aware and verbalized understanding.

## 2017-01-05 NOTE — Telephone Encounter (Signed)
New Message   Mala patient daughter is calling on behalf of mother. She is wanting to know the results of the Echo her mother had done last week. Please call.

## 2017-01-05 NOTE — Telephone Encounter (Signed)
Spoke with Kerin Ransom, PA he advised patient to start HCTZ 12.5mg  take 1 tablet once a day. Rx sent to [facility]ahrmacy. Patient's daughter [name], per drp, voiced understanding

## 2017-01-09 DIAGNOSIS — J449 Chronic obstructive pulmonary disease, unspecified: Secondary | ICD-10-CM | POA: Diagnosis not present

## 2017-01-09 DIAGNOSIS — I10 Essential (primary) hypertension: Secondary | ICD-10-CM | POA: Diagnosis not present

## 2017-01-09 DIAGNOSIS — E039 Hypothyroidism, unspecified: Secondary | ICD-10-CM | POA: Diagnosis not present

## 2017-01-13 DIAGNOSIS — J449 Chronic obstructive pulmonary disease, unspecified: Secondary | ICD-10-CM | POA: Diagnosis not present

## 2017-01-15 ENCOUNTER — Other Ambulatory Visit: Payer: Self-pay | Admitting: Family Medicine

## 2017-01-15 ENCOUNTER — Telehealth: Payer: Self-pay | Admitting: Family Medicine

## 2017-01-15 DIAGNOSIS — R296 Repeated falls: Secondary | ICD-10-CM

## 2017-01-15 NOTE — Telephone Encounter (Signed)
Will pt ntbs again? Please advise.

## 2017-01-15 NOTE — Telephone Encounter (Signed)
Daughter aware Katie Bryant will be in contact

## 2017-01-15 NOTE — Telephone Encounter (Signed)
Daughter aware pt will need to be seen again. They will talk it over and call back to make an appt

## 2017-01-15 NOTE — Telephone Encounter (Signed)
Yes we can go ahead and try and put it in I do not know if the insurance will cover for it but go ahead and try and put in another referral for physical therapy through home health.  Diagnosis generalized weakness and recurrent falls

## 2017-01-22 ENCOUNTER — Encounter: Payer: Self-pay | Admitting: Family Medicine

## 2017-01-22 ENCOUNTER — Ambulatory Visit (INDEPENDENT_AMBULATORY_CARE_PROVIDER_SITE_OTHER): Payer: Medicare Other | Admitting: Family Medicine

## 2017-01-22 ENCOUNTER — Ambulatory Visit (INDEPENDENT_AMBULATORY_CARE_PROVIDER_SITE_OTHER): Payer: Medicare Other

## 2017-01-22 VITALS — BP 135/61 | HR 67 | Temp 97.4°F | Ht 64.0 in | Wt 156.0 lb

## 2017-01-22 DIAGNOSIS — J441 Chronic obstructive pulmonary disease with (acute) exacerbation: Secondary | ICD-10-CM

## 2017-01-22 DIAGNOSIS — R2689 Other abnormalities of gait and mobility: Secondary | ICD-10-CM

## 2017-01-22 DIAGNOSIS — R0602 Shortness of breath: Secondary | ICD-10-CM | POA: Diagnosis not present

## 2017-01-22 DIAGNOSIS — R34 Anuria and oliguria: Secondary | ICD-10-CM

## 2017-01-22 DIAGNOSIS — R296 Repeated falls: Secondary | ICD-10-CM

## 2017-01-22 DIAGNOSIS — R05 Cough: Secondary | ICD-10-CM | POA: Diagnosis not present

## 2017-01-22 MED ORDER — PREDNISONE 20 MG PO TABS: ORAL_TABLET | ORAL | 0 refills | Status: DC

## 2017-01-22 MED ORDER — DOXYCYCLINE HYCLATE 100 MG PO TABS: 100.0000 mg | ORAL_TABLET | Freq: Two times a day (BID) | ORAL | 0 refills | Status: DC

## 2017-01-22 NOTE — Progress Notes (Signed)
BP 135/61   Pulse 67   Temp (!) 97.4 F (36.3 C) (Oral)   Ht 5' 4" (1.626 m)   Wt 156 lb (70.8 kg)   SpO2 95% Comment: with oxygen  BMI 26.78 kg/m    Subjective:    Patient ID: Katie Bryant, female    DOB: July 17, 1928, 84 y.o.   MRN: [id number]  HPI: Katie Bryant is a 84 y.o. female presenting on 01/22/2017 for Cough, chest congestion (taking Mucinex DM and Delsym; x 4 days)   HPI Cough and chest congestion and wheezing and shortness of breath She has been having cough and chest congestion and chest tightness and trouble breathing that has been worsening over the past 4 days.  Patient has known COPD and is on oxygen 2 L nasal cannula all of the time but feels like things are just worsening and she is needing a little bit more oxygen past few days.  She has been using Mucinex DM and Delsym to help without much success.  She is continued on her inhalers that she has been on previously.  She denies any fevers or chills but mainly just has the chest tightness of the shortness of breath.  Her daughter is here today and is concerned that her urine is decreased.  Recurrent falls and weakness Patient has been working with physical therapy at home for her recurrent falls and balance issues which has improved but they would like to have it re-ordered or redone.  She said her strength has been decreased especially around her core.  Relevant past medical, surgical, family and social history reviewed and updated as indicated. Interim medical history since our last visit reviewed. Allergies and medications reviewed and updated.  Review of Systems  Constitutional: Negative for chills and fever.  HENT: Positive for congestion, postnasal drip, rhinorrhea, sinus pressure, sneezing and sore throat. Negative for ear discharge and ear pain.   Eyes: Negative for pain, redness and visual disturbance.  Respiratory: Positive for cough, shortness of breath and wheezing. Negative for chest tightness.     Cardiovascular: Negative for chest pain and leg swelling.  Genitourinary: Positive for decreased urine volume.  Musculoskeletal: Negative for back pain and gait problem.  Skin: Negative for rash.  Neurological: Positive for weakness. Negative for light-headedness and headaches.  All other systems reviewed and are negative.   Per HPI unless specifically indicated above   Allergies as of 01/22/2017   No Known Allergies     Medication List        Accurate as of 01/22/17 11:21 AM. Always use your most recent med list.          acetaminophen 500 MG tablet Commonly known as:  TYLENOL Take 500 mg by mouth every 6 (six) hours as needed (pain).   ALPRAZolam 1 MG tablet Commonly known as:  XANAX Take 1 tablet (1 mg total) by mouth at bedtime.   ARIPiprazole 5 MG tablet Commonly known as:  ABILIFY Take 1 tablet (5 mg total) by mouth daily.   aspirin 81 MG tablet Take 81 mg by mouth daily.   atorvastatin 40 MG tablet Commonly known as:  LIPITOR TAKE 1 TABLET DAILY   brimonidine 0.2 % ophthalmic solution Commonly known as:  ALPHAGAN Place 1 drop into both eyes daily.   budesonide 0.5 MG/2ML nebulizer solution Commonly known as:  PULMICORT NEBULIZE 1 VIAL TWICE A DAY   buPROPion 200 MG 12 hr tablet Commonly known as:  WELLBUTRIN SR Take 1 tablet (  200 mg total) by mouth 2 (two) times daily.   cetirizine 10 MG tablet Commonly known as:  ZYRTEC Take 10 mg by mouth daily as needed for allergies.   citalopram 20 MG tablet Commonly known as:  CELEXA Take 1 tablet by mouth daily.   DELSYM 30 MG/5ML liquid Generic drug:  dextromethorphan Take 30 mg 2 (two) times daily as needed by mouth for cough.   dextromethorphan-guaiFENesin 30-600 MG 12hr tablet Commonly known as:  MUCINEX DM Take 1 tablet 2 (two) times daily as needed by mouth for cough.   dorzolamide 2 % ophthalmic solution Commonly known as:  TRUSOPT Place 1 drop into both eyes 2 (two) times daily.    fenofibrate micronized 134 MG capsule Commonly known as:  LOFIBRA TAKE (1) CAPSULE DAILY BEFORE BREAKFAST.   FLUTTER Devi As needed   hydrochlorothiazide 12.5 MG capsule Commonly known as:  MICROZIDE Take 1 capsule (12.5 mg total) by mouth daily.   ipratropium-albuterol 0.5-2.5 (3) MG/3ML Soln Commonly known as:  DUONEB NEBULIZE 1 VIAL 4 TIMES A DAY   ketorolac 0.5 % ophthalmic solution Commonly known as:  ACULAR Place 1 drop into both eyes 4 (four) times daily.   levothyroxine 50 MCG tablet Commonly known as:  SYNTHROID, LEVOTHROID TAKE 1 TABLET DAILY   LORazepam 0.5 MG tablet Commonly known as:  ATIVAN Take 0.5 mg 2 (two) times daily as needed by mouth for anxiety.   meclizine 25 MG tablet Commonly known as:  ANTIVERT Take 25 mg every 8 (eight) hours as needed by mouth for dizziness.   memantine 28 MG Cp24 24 hr capsule Commonly known as:  NAMENDA XR Take 1 capsule (28 mg total) by mouth daily.   metoprolol tartrate 25 MG tablet Commonly known as:  LOPRESSOR TAKE (1/2) TABLET TWICE DAILY.   montelukast 10 MG tablet Commonly known as:  SINGULAIR TAKE ONE TABLET AT BEDTIME   OXYGEN Oxygen 3lpm 24/7, Increase to 4L as needed for shortness of breath with activity   PROAIR HFA 108 (90 Base) MCG/ACT inhaler Generic drug:  albuterol INHALE 2 PUFFS BY MOUTH EVERY 6 HOURS AS NEEDED   ranitidine 150 MG tablet Commonly known as:  ZANTAC TAKE ONE TABLET AT BEDTIME   RHOPRESSA 0.02 % Soln Generic drug:  Netarsudil Dimesylate Place 1 drop into both eyes at bedtime.   sodium chloride 0.65 % Soln nasal spray Commonly known as:  OCEAN 2 puffs every 4-6 hours as needed for nasal congestion   sodium chloride 2 % ophthalmic solution Commonly known as:  MURO 128 Place 1 drop into both eyes at bedtime.   valsartan-hydrochlorothiazide 80-12.5 MG tablet Commonly known as:  DIOVAN-HCT TAKE 1 TABLET DAILY   Vitamin D (Ergocalciferol) 50000 units Caps capsule Commonly  known as:  DRISDOL Take 1 capsule (50,000 Units total) by mouth every 7 (seven) days.          Objective:    BP 135/61   Pulse 67   Temp (!) 97.4 F (36.3 C) (Oral)   Ht 5' 4" (1.626 m)   Wt 156 lb (70.8 kg)   SpO2 95% Comment: with oxygen  BMI 26.78 kg/m   Wt Readings from Last 3 Encounters:  01/22/17 156 lb (70.8 kg)  01/01/17 155 lb 3.2 oz (70.4 kg)  11/18/16 156 lb (70.8 kg)    Physical Exam  Constitutional: She is oriented to person, place, and time. She appears well-developed and well-nourished. No distress.  HENT:  Right Ear: Tympanic membrane, external ear   and ear canal normal.  Left Ear: Tympanic membrane, external ear and ear canal normal.  Nose: Mucosal edema and rhinorrhea present. No epistaxis. Right sinus exhibits no maxillary sinus tenderness and no frontal sinus tenderness. Left sinus exhibits no maxillary sinus tenderness and no frontal sinus tenderness.  Mouth/Throat: Uvula is midline and mucous membranes are normal. Posterior oropharyngeal edema and posterior oropharyngeal erythema present. No oropharyngeal exudate or tonsillar abscesses.  Eyes: Conjunctivae are normal.  Cardiovascular: Normal rate, regular rhythm, normal heart sounds and intact distal pulses.  No murmur heard. Pulmonary/Chest: Effort normal. No respiratory distress. She has decreased breath sounds. She has wheezes. She has rhonchi. She has no rales.  Musculoskeletal: Normal range of motion. She exhibits no edema or tenderness.  Neurological: She is alert and oriented to person, place, and time. Coordination normal.  Skin: Skin is warm and dry. No rash noted. She is not diaphoretic.  Psychiatric: She has a normal mood and affect. Her behavior is normal.  Vitals reviewed.       Assessment & Plan:   Problem List Items Addressed This Visit    None    Visit Diagnoses    COPD exacerbation (HCC)    -  Primary   Relevant Medications   doxycycline (VIBRA-TABS) 100 MG tablet   predniSONE  (DELTASONE) 20 MG tablet   Other Relevant Orders   DG Chest 2 View (Completed)   Balance problem       Relevant Orders   Face-to-face encounter (required for Medicare/Medicaid patients)   Recurrent falls while walking       Relevant Orders   Face-to-face encounter (required for Medicare/Medicaid patients)   Decreased urine output       Relevant Orders   BMP8+EGFR (Completed)       Follow up plan: Return if symptoms worsen or fail to improve.  Counseling provided for all of the vaccine components Orders Placed This Encounter  Procedures  . DG Chest 2 View  . Face-to-face encounter (required for Medicare/Medicaid patients)     , MD [facility] 01/22/2017, 11:21 AM     

## 2017-01-23 LAB — BMP8+EGFR
BUN/Creatinine Ratio: 23 (ref 12–28)
BUN: 16 mg/dL (ref 8–27)
CALCIUM: 9.6 mg/dL (ref 8.7–10.3)
CO2: 28 mmol/L (ref 20–29)
Chloride: 93 mmol/L — ABNORMAL LOW (ref 96–106)
Creatinine, Ser: 0.71 mg/dL (ref 0.57–1.00)
GFR, EST AFRICAN AMERICAN: 89 mL/min/{1.73_m2} (ref >59)
GFR, EST NON AFRICAN AMERICAN: 77 mL/min/{1.73_m2} (ref >59)
Glucose: 54 mg/dL — ABNORMAL LOW (ref 65–99)
POTASSIUM: 4.2 mmol/L (ref 3.5–5.2)
Sodium: 138 mmol/L (ref 134–144)

## 2017-02-05 ENCOUNTER — Other Ambulatory Visit: Payer: Self-pay | Admitting: Internal Medicine

## 2017-02-10 DIAGNOSIS — M7072 Other bursitis of hip, left hip: Secondary | ICD-10-CM | POA: Diagnosis not present

## 2017-02-10 DIAGNOSIS — M7071 Other bursitis of hip, right hip: Secondary | ICD-10-CM | POA: Diagnosis not present

## 2017-02-10 DIAGNOSIS — M545 Low back pain: Secondary | ICD-10-CM | POA: Diagnosis not present

## 2017-02-10 DIAGNOSIS — M5136 Other intervertebral disc degeneration, lumbar region: Secondary | ICD-10-CM | POA: Diagnosis not present

## 2017-02-13 DIAGNOSIS — J449 Chronic obstructive pulmonary disease, unspecified: Secondary | ICD-10-CM | POA: Diagnosis not present

## 2017-02-18 ENCOUNTER — Other Ambulatory Visit: Payer: Self-pay | Admitting: *Deleted

## 2017-02-18 MED ORDER — CITALOPRAM HYDROBROMIDE 20 MG PO TABS: 20.0000 mg | ORAL_TABLET | Freq: Every day | ORAL | 2 refills | Status: DC

## 2017-02-25 DIAGNOSIS — H40053 Ocular hypertension, bilateral: Secondary | ICD-10-CM | POA: Diagnosis not present

## 2017-02-28 ENCOUNTER — Telehealth: Payer: Self-pay | Admitting: *Deleted

## 2017-02-28 NOTE — Telephone Encounter (Signed)
Daughter called and stated that patient's O2 is low can not get it above 86% on oxygen.  Informed daughter that patient needs to go to the ED to be evaluated.

## 2017-03-02 ENCOUNTER — Encounter (INDEPENDENT_AMBULATORY_CARE_PROVIDER_SITE_OTHER): Payer: Medicare Other | Admitting: Ophthalmology

## 2017-03-03 ENCOUNTER — Telehealth: Payer: Self-pay | Admitting: Family Medicine

## 2017-03-03 ENCOUNTER — Ambulatory Visit: Payer: Medicare Other | Admitting: Family Medicine

## 2017-03-03 MED ORDER — OSELTAMIVIR PHOSPHATE 75 MG PO CAPS: 75.0000 mg | ORAL_CAPSULE | Freq: Every day | ORAL | 0 refills | Status: DC

## 2017-03-03 NOTE — Telephone Encounter (Signed)
Tamiflu Prescription sent to pharmacy. Take daily for 10 days unless she develops symptoms and then start taking BID.

## 2017-03-04 ENCOUNTER — Encounter: Payer: Self-pay | Admitting: Family Medicine

## 2017-03-05 DIAGNOSIS — J441 Chronic obstructive pulmonary disease with (acute) exacerbation: Secondary | ICD-10-CM | POA: Diagnosis not present

## 2017-03-05 DIAGNOSIS — E876 Hypokalemia: Secondary | ICD-10-CM | POA: Diagnosis not present

## 2017-03-05 DIAGNOSIS — Z87891 Personal history of nicotine dependence: Secondary | ICD-10-CM | POA: Diagnosis not present

## 2017-03-05 DIAGNOSIS — R404 Transient alteration of awareness: Secondary | ICD-10-CM | POA: Diagnosis not present

## 2017-03-05 DIAGNOSIS — R531 Weakness: Secondary | ICD-10-CM | POA: Diagnosis not present

## 2017-03-05 DIAGNOSIS — R0602 Shortness of breath: Secondary | ICD-10-CM | POA: Diagnosis not present

## 2017-03-10 NOTE — Telephone Encounter (Signed)
Daughter states that patient has picked up and started taking tamiflu

## 2017-03-13 ENCOUNTER — Encounter (INDEPENDENT_AMBULATORY_CARE_PROVIDER_SITE_OTHER): Payer: Medicare Other | Admitting: Ophthalmology

## 2017-03-13 DIAGNOSIS — H59033 Cystoid macular edema following cataract surgery, bilateral: Secondary | ICD-10-CM | POA: Diagnosis not present

## 2017-03-13 DIAGNOSIS — H353132 Nonexudative age-related macular degeneration, bilateral, intermediate dry stage: Secondary | ICD-10-CM | POA: Diagnosis not present

## 2017-03-13 DIAGNOSIS — H43813 Vitreous degeneration, bilateral: Secondary | ICD-10-CM

## 2017-03-13 DIAGNOSIS — J449 Chronic obstructive pulmonary disease, unspecified: Secondary | ICD-10-CM | POA: Diagnosis not present

## 2017-03-13 DIAGNOSIS — I10 Essential (primary) hypertension: Secondary | ICD-10-CM | POA: Diagnosis not present

## 2017-03-13 DIAGNOSIS — H35033 Hypertensive retinopathy, bilateral: Secondary | ICD-10-CM | POA: Diagnosis not present

## 2017-03-18 ENCOUNTER — Other Ambulatory Visit: Payer: Self-pay | Admitting: Family Medicine

## 2017-03-19 ENCOUNTER — Encounter: Payer: Self-pay | Admitting: Internal Medicine

## 2017-03-19 ENCOUNTER — Ambulatory Visit (INDEPENDENT_AMBULATORY_CARE_PROVIDER_SITE_OTHER)
Admission: RE | Admit: 2017-03-19 | Discharge: 2017-03-19 | Disposition: A | Discharge status: Home / Self Care | Payer: Medicare Other | Source: Ambulatory Visit | Attending: Internal Medicine | Admitting: Internal Medicine

## 2017-03-19 ENCOUNTER — Ambulatory Visit: Payer: Medicare Other | Admitting: Internal Medicine

## 2017-03-19 VITALS — BP 164/72 | Ht 64.0 in | Wt 146.0 lb

## 2017-03-19 DIAGNOSIS — J441 Chronic obstructive pulmonary disease with (acute) exacerbation: Secondary | ICD-10-CM

## 2017-03-19 DIAGNOSIS — R079 Chest pain, unspecified: Secondary | ICD-10-CM | POA: Diagnosis not present

## 2017-03-19 DIAGNOSIS — J9612 Chronic respiratory failure with hypercapnia: Secondary | ICD-10-CM | POA: Diagnosis not present

## 2017-03-19 DIAGNOSIS — J449 Chronic obstructive pulmonary disease, unspecified: Secondary | ICD-10-CM | POA: Diagnosis not present

## 2017-03-19 DIAGNOSIS — R05 Cough: Secondary | ICD-10-CM | POA: Diagnosis not present

## 2017-03-19 MED ORDER — AMOXICILLIN-POT CLAVULANATE 875-125 MG PO TABS: 1.0000 | ORAL_TABLET | Freq: Two times a day (BID) | ORAL | 0 refills | Status: AC

## 2017-03-19 MED ORDER — ACETAMINOPHEN-CODEINE #3 300-30 MG PO TABS: ORAL_TABLET | ORAL | 0 refills | Status: DC

## 2017-03-19 MED ORDER — PREDNISONE 10 MG PO TABS: ORAL_TABLET | ORAL | 0 refills | Status: DC

## 2017-03-19 NOTE — Progress Notes (Signed)
Subjective:     Patient ID: Katie Bryant, female   DOB: 01-24-1928    MRN: [id number]   Brief patient profile:  36 yowf quit smoking 1992 with GOLD III copd dx 2009 eval in pulm clinic p hospitalized at [facility] 12/21/10   History of Present Illness  09/27/2013 Follow up and Med review  Hx of COPD III/ 02 dep/ has med  Patient returns for followup and medication review. We reviewed all her medications and organized them into a medication calendar with patient education. Does get easily confused with meds. Appears family is helping with meds now.  Recently dx with dementia, now on Namenda. Seems to be some improvement with memory.  rec No change rx/ follow med calendar and bring to each visit/ daughter [name]      11/04/2016  f/u ov/Bridget  re:  Copd GOLD III/ ? Increased 02 req ? (not monitoring sats as req) Chief Complaint  Patient presents with  . Follow-up    Pt has leg swelling more in rt leg/foot then left. Pt has sob, wheezing with dry cough. Pt's daughter states pt is not doing well, now living with the daughter  breathing worse since late August Admit to [facility] 09/12/16 for ams "it turned out to be sob  Not adjusting 02 as rec for sats but rather "for how she feels"  Sleeping fine on side but increaesed 02 to 5lpm hs by daughter  Has med calendar but does not appear to be using Doe = MMRC3 = can't walk 100 yards even at a slow pace at a flat grade s stopping due to sob  Even on 5lpm  rec No change rx x mucinex should be mucinex dm 2 every      03/19/2017  Acute  ov/Audreyanna Butkiewicz re:  GOLD III spirometry  but 02 dep chronically  Chief Complaint  Patient presents with  . Acute Visit    She is c/o increased congestion since she had the Flu in late Feb 2019. She has had some left side pain in her rib area for the past few wks- painful when she coughs. She is using her albuterol inhaler 4 x daily on average. She has lost 10 lbs since last visit in Nov 2019 "but I eat like a horse".   Dyspnea:  Walking with rollator on up to 5lpm  Cough: much worse since flu / fell and now L cp with cough Sleep: difficult due to cough  SABA use: up to 4 x daily    No obvious day to day or daytime variability or assoc excess/ purulent sputum or mucus plugs or hemoptysis   or chest tightness, subjective wheeze or overt sinus or hb symptoms. No unusual exposure hx or h/o childhood pna/ asthma or knowledge of premature birth.  Sleeping ok flat without nocturnal  or early am exacerbation  of respiratory  c/o's or need for noct saba. Also denies any obvious fluctuation of symptoms with weather or environmental changes or other aggravating or alleviating factors except as outlined above   Current Allergies, Complete Past Medical History, Past Surgical History, Family History, and Social History were reviewed in [employer] record.  ROS  The following are not active complaints unless bolded Hoarseness, sore throat, dysphagia, dental problems, itching, sneezing,  nasal congestion or discharge of excess mucus or purulent secretions, ear ache,   fever, chills, sweats, unintended wt loss or wt gain, classically  exertional cp,  orthopnea pnd or leg swelling, presyncope, palpitations, abdominal  pain, anorexia, nausea, vomiting, diarrhea  or change in bowel habits or change in bladder habits, change in stools or change in urine, dysuria, hematuria,  rash, arthralgias, visual complaints, headache, numbness, weakness or ataxia or problems with walking or coordination,  change in mood/affect or memory.        Current Meds  Medication Sig  . acetaminophen (TYLENOL) 500 MG tablet Take 500 mg by mouth every 6 (six) hours as needed (pain).   Marland Kitchen ALPRAZolam (XANAX) 1 MG tablet Take 1 tablet (1 mg total) by mouth at bedtime.  . ARIPiprazole (ABILIFY) 5 MG tablet Take 1 tablet (5 mg total) by mouth daily.  Marland Kitchen aspirin 81 MG tablet Take 81 mg by mouth daily.  Marland Kitchen atorvastatin (LIPITOR) 40 MG  tablet TAKE 1 TABLET DAILY  . brimonidine (ALPHAGAN) 0.2 % ophthalmic solution Place 1 drop into both eyes daily.  . budesonide (PULMICORT) 0.5 MG/2ML nebulizer solution NEBULIZE 1 VIAL TWICE A DAY  . buPROPion (WELLBUTRIN SR) 200 MG 12 hr tablet Take 1 tablet (200 mg total) by mouth 2 (two) times daily.  . cetirizine (ZYRTEC) 10 MG tablet Take 10 mg by mouth daily as needed for allergies.  . citalopram (CELEXA) 20 MG tablet Take 1 tablet (20 mg total) by mouth daily.  Marland Kitchen dextromethorphan (DELSYM) 30 MG/5ML liquid Take 30 mg 2 (two) times daily as needed by mouth for cough.  . dextromethorphan-guaiFENesin (MUCINEX DM) 30-600 MG 12hr tablet Take 1 tablet 2 (two) times daily as needed by mouth for cough.  . fenofibrate micronized (LOFIBRA) 134 MG capsule TAKE (1) CAPSULE DAILY BEFORE BREAKFAST.  . hydrochlorothiazide (MICROZIDE) 12.5 MG capsule Take 1 capsule (12.5 mg total) by mouth daily.  Marland Kitchen ipratropium-albuterol (DUONEB) 0.5-2.5 (3) MG/3ML SOLN NEBULIZE 1 VIAL 4 TIMES A DAY  . ketorolac (ACULAR) 0.5 % ophthalmic solution Place 1 drop into both eyes 4 (four) times daily.  Marland Kitchen levothyroxine (SYNTHROID, LEVOTHROID) 50 MCG tablet TAKE 1 TABLET DAILY  . LORazepam (ATIVAN) 0.5 MG tablet Take 0.5 mg 2 (two) times daily as needed by mouth for anxiety.  . meclizine (ANTIVERT) 25 MG tablet Take 25 mg every 8 (eight) hours as needed by mouth for dizziness.  . memantine (NAMENDA XR) 28 MG CP24 24 hr capsule TAKE (1) CAPSULE DAILY  . metoprolol tartrate (LOPRESSOR) 25 MG tablet TAKE (1/2) TABLET TWICE DAILY.  . montelukast (SINGULAIR) 10 MG tablet TAKE ONE TABLET AT BEDTIME  . OXYGEN Oxygen 3lpm 24/7, Increase to 4L as needed for shortness of breath with activity  . PROAIR HFA 108 (90 Base) MCG/ACT inhaler INHALE 2 PUFFS BY MOUTH EVERY 6 HOURS AS NEEDED  . ranitidine (ZANTAC) 150 MG tablet TAKE ONE TABLET AT BEDTIME  . Respiratory Therapy Supplies (FLUTTER) DEVI As needed  . RHOPRESSA 0.02 % SOLN Place 1 drop  into both eyes at bedtime.  . sodium chloride (OCEAN) 0.65 % SOLN nasal spray 2 puffs every 4-6 hours as needed for nasal congestion  . valsartan-hydrochlorothiazide (DIOVAN-HCT) 80-12.5 MG tablet TAKE 1 TABLET DAILY  . Vitamin D, Ergocalciferol, (DRISDOL) 50000 units CAPS capsule Take 1 capsule (50,000 Units total) by mouth every 7 (seven) days.              Objective:  Physical Exam  Chronically ill amb wf nad    Vital signs reviewed - Note on arrival 02 sats  92% on   5lpm pulsed      Wt 184 01/21/2011  >08/27/2011 186 > 180 09/29/2011 > 10/07/2011  183 > 11/11/2011  181 > 01/13/2012  183 >180 02/12/2012 > 06/09/2012 188 > 189 10/12/2012 >188 01/14/2013 > 01/21/2013 187 > 03/02/2013 >184 03/18/2013 >  03/28/2013 186> 06/06/2013  182 >  07/18/2013  179 >181 09/27/2013 > 12/08/2013   183 > 02/24/2014  178 > 06/09/2014 177 > 09/07/2014 175 > 12/05/2014   173 > 03/06/2015 173>  10/16/2015   171 >  04/28/2016  167 > 11/04/2016  155 >  03/19/2017  146   HEENT: nl dentition, turbinates bilaterally, and oropharynx. Nl external ear canals without cough reflex   NECK :  without JVD/Nodes/TM/ nl carotid upstrokes bilaterally   LUNGS: no acc muscle use, slt barrel and kyphotic chest contour chest with distant bs bilaterally and min inps/ exp rhonchi   without cough on insp or exp maneuvers   CV:  RRR  no s3 or murmur or increase in P2, and trace pedal edema R > L   ABD:  soft and nontender with nl inspiratory excursion in the supine position. No bruits or organomegaly appreciated, bowel sounds nl  MS:  Nl gait/ ext warm without deformities, calf tenderness, cyanosis or clubbing No obvious joint restrictions   SKIN: warm and dry without lesions   No bruising over L chest wall   NEURO:  alert, approp, nl sensorium with  no motor or cerebellar deficits apparent.      CXR PA and Lateral:   03/19/2017 :    I personally reviewed images and agree with radiology impression as follows:   Bibasilar fibrosis. Scattered  areas of scarring and generalized interstitial prominence. No frank edema or consolidation. Suspect a degree of pulmonary arterial hypertension.           Chemistry

## 2017-03-19 NOTE — Patient Instructions (Signed)
Augmentin 875 mg take one pill twice daily  X 10 days - take at breakfast and supper with large glass of water.  It would help reduce the usual side effects (diarrhea and yeast infections) if you ate cultured yogurt at lunch.    For cough > mucinex dm up to 1200 every 12 hours and flutter valve as much as you can then add tylenol #3 one- half to one every 4 hours if needed   Prednisone 10 mg Take 4 for two days three for two days two for two days one for two days  Please remember to go to the  x-ray department downstairs in the basement  for your tests - we will call you with the results when they are available.      Please schedule a follow up office visit in 6 weeks, call sooner if needed to see Tammy

## 2017-03-20 NOTE — Progress Notes (Signed)
Spoke with pt and notified of results per Dr. Wert. Pt verbalized understanding and denied any questions. 

## 2017-03-22 NOTE — Assessment & Plan Note (Addendum)
No change maint rx needed, actually doing relatively well considering - see aecopd s/p  I had an extended discussion with the patient reviewing all relevant studies completed to date and  lasting 15 to 20 minutes of a 25 minute acute  visit    Each maintenance medication was reviewed in detail including most importantly the difference between maintenance and prns and under what circumstances the prns are to be triggered using an action plan format that is not reflected in the computer generated alphabetically organized AVS but trather by a customized med calendar that reflects the AVS meds with confirmed 100% correlation.   In addition, Please see AVS for unique instructions that I personally wrote and verbalized to the the pt in detail and then reviewed with pt  by my nurse highlighting any  changes in therapy recommended at today's visit to their plan of care.

## 2017-03-24 ENCOUNTER — Telehealth: Payer: Self-pay | Admitting: Family Medicine

## 2017-03-24 ENCOUNTER — Encounter: Payer: Self-pay | Admitting: Internal Medicine

## 2017-03-24 NOTE — Assessment & Plan Note (Signed)
-   PFT's 06/11/2007  FEV1   0.70 (34%) ratio 32 with 49% improvement p B2    - HFA 75% effective p coaching 06/20/2011 > 50% 06/24/2011  -Med calendar 09/27/2013 > did not bring as requested 06/09/14 , 06/05/2015 redone  - improved reconciliation 09/07/2014 and 03/07/2015 and 10/16/2015  - 06/05/2016  After extensive coaching HFA effectiveness =    50% from a baseline of 25%   Acute flare p flu in Feb with residual AB component and poor cough mechanics   rec  augmentin x 10 days Prednisone 10 mg take  4 each am x 2 days,   2 each am x 2 days,  1 each am x 2 days and stop  Reviewed flutter valve use/ tyl #3 if severe cough or pain with coughing

## 2017-03-24 NOTE — Assessment & Plan Note (Signed)
-   sats 90% RA 03/11/2011     - Dr Annamaria Boots started 02 around 2010    - 3 lpm 24/7 except 4lpm with activity as of 03/02/2013     - HC03 trending in low 30's since 2012 so likely hypercarbic component as well   - 02/24/2014   Walked RA x one lap @ 185 stopped due to  desat to 83% corrected on 4lpm    - 12/05/2014   Walked 3lpm pulsed x 2 laps @ 185 ft each stopped due to sob with sat 89% at moderate pace    - 03/06/2015   Walked 4lpm 2 laps @ 185 ft each stopped due to  Sob/ desat to 86% at pace faster than her nl  - 11/04/2016  HCO3  =32  -  12/08/2016  Apri notified "does not qualify for POC "  - not clear whether titration attempted.     rx as of 03/19/2017  = 3lpm 24/7  And titrate to keep > 90% with activity

## 2017-03-24 NOTE — Telephone Encounter (Signed)
Appointment made tomorrow, 03/25/2017, at 2:25 with Dr. Warrick Parisian.

## 2017-03-25 ENCOUNTER — Ambulatory Visit (INDEPENDENT_AMBULATORY_CARE_PROVIDER_SITE_OTHER): Payer: Medicare Other | Admitting: Family Medicine

## 2017-03-25 ENCOUNTER — Encounter: Payer: Self-pay | Admitting: Family Medicine

## 2017-03-25 VITALS — BP 111/60 | HR 75 | Temp 98.5°F | Ht 64.0 in | Wt 145.4 lb

## 2017-03-25 DIAGNOSIS — K625 Hemorrhage of anus and rectum: Secondary | ICD-10-CM

## 2017-03-25 DIAGNOSIS — R7309 Other abnormal glucose: Secondary | ICD-10-CM | POA: Diagnosis not present

## 2017-03-25 DIAGNOSIS — K648 Other hemorrhoids: Secondary | ICD-10-CM

## 2017-03-25 DIAGNOSIS — R634 Abnormal weight loss: Secondary | ICD-10-CM | POA: Diagnosis not present

## 2017-03-25 MED ORDER — HYDROCORTISONE ACETATE 25 MG RE SUPP: 25.0000 mg | Freq: Two times a day (BID) | RECTAL | 0 refills | Status: DC

## 2017-03-25 NOTE — Progress Notes (Signed)
BP 111/60   Pulse 75   Temp 98.5 F (36.9 C) (Oral)   Ht 5' 4" (1.626 m)   Wt 145 lb 6 oz (65.9 kg)   BMI 24.95 kg/m    Subjective:    Patient ID: Katie Bryant, female    DOB: Oct 14, 1928, 84 y.o.   MRN: [id number]  HPI: Katie Bryant is a 84 y.o. female presenting on 03/25/2017 for Weight Loss despite eating well (has lost 11 lbs since January); Abdominal pain/tenderness (LUQ); and Hemorrhoids (has noticed some blood dripping)   HPI Patient is being brought in by her daughter today complaining of weight loss and rectal bleeding and hemorrhoids.  She does have some lower abdominal pain and has been constipated.  Daughter says she has been eating better but still not gaining weight and she is been concerned about that.  She has not been having bowel movements as frequently as she normally would.  Daughter does admit that she knows she has been having dementia and feels like it is worsening.  She says the abdominal pain is mild and intermittent.  They deny her having any fevers or chills.  She denies any urinary burning or pain or blood.  Relevant past medical, surgical, family and social history reviewed and updated as indicated. Interim medical history since our last visit reviewed. Allergies and medications reviewed and updated.  Review of Systems  Constitutional: Positive for appetite change. Negative for chills and fever.  HENT: Negative for congestion, ear discharge and ear pain.   Eyes: Negative for redness and visual disturbance.  Respiratory: Negative for chest tightness and shortness of breath.   Cardiovascular: Negative for chest pain and leg swelling.  Gastrointestinal: Positive for abdominal pain, anal bleeding and constipation. Negative for abdominal distention, diarrhea, nausea and vomiting.  Genitourinary: Negative for difficulty urinating and dysuria.  Musculoskeletal: Negative for back pain and gait problem.  Skin: Negative for rash.  Neurological: Negative for  light-headedness and headaches.  Psychiatric/Behavioral: Positive for confusion and decreased concentration. Negative for agitation and behavioral problems.  All other systems reviewed and are negative.   Per HPI unless specifically indicated above   Allergies as of 03/25/2017   No Known Allergies     Medication List        Accurate as of 03/25/17  3:49 PM. Always use your most recent med list.          acetaminophen 500 MG tablet Commonly known as:  TYLENOL Take 500 mg by mouth every 6 (six) hours as needed (pain).   acetaminophen-codeine 300-30 MG tablet Commonly known as:  TYLENOL #3 One every 4 hours as needed for cough   ALPRAZolam 1 MG tablet Commonly known as:  XANAX Take 1 tablet (1 mg total) by mouth at bedtime.   amoxicillin-clavulanate 875-125 MG tablet Commonly known as:  AUGMENTIN Take 1 tablet by mouth 2 (two) times daily for 10 days.   ARIPiprazole 5 MG tablet Commonly known as:  ABILIFY Take 1 tablet (5 mg total) by mouth daily.   aspirin 81 MG tablet Take 81 mg by mouth daily.   atorvastatin 40 MG tablet Commonly known as:  LIPITOR TAKE 1 TABLET DAILY   brimonidine 0.2 % ophthalmic solution Commonly known as:  ALPHAGAN Place 1 drop into both eyes daily.   budesonide 0.5 MG/2ML nebulizer solution Commonly known as:  PULMICORT NEBULIZE 1 VIAL TWICE A DAY   buPROPion 200 MG 12 hr tablet Commonly known as:  WELLBUTRIN SR Take  1 tablet (200 mg total) by mouth 2 (two) times daily.   cetirizine 10 MG tablet Commonly known as:  ZYRTEC Take 10 mg by mouth daily as needed for allergies.   citalopram 20 MG tablet Commonly known as:  CELEXA Take 1 tablet (20 mg total) by mouth daily.   DELSYM 30 MG/5ML liquid Generic drug:  dextromethorphan Take 30 mg 2 (two) times daily as needed by mouth for cough.   dextromethorphan-guaiFENesin 30-600 MG 12hr tablet Commonly known as:  MUCINEX DM Take 1 tablet 2 (two) times daily as needed by mouth for  cough.   fenofibrate micronized 134 MG capsule Commonly known as:  LOFIBRA TAKE (1) CAPSULE DAILY BEFORE BREAKFAST.   FLUTTER Devi As needed   hydrochlorothiazide 12.5 MG capsule Commonly known as:  MICROZIDE Take 1 capsule (12.5 mg total) by mouth daily.   hydrocortisone 25 MG suppository Commonly known as:  ANUSOL-HC Place 1 suppository (25 mg total) rectally 2 (two) times daily.   ipratropium-albuterol 0.5-2.5 (3) MG/3ML Soln Commonly known as:  DUONEB NEBULIZE 1 VIAL 4 TIMES A DAY   ketorolac 0.5 % ophthalmic solution Commonly known as:  ACULAR Place 1 drop into both eyes 4 (four) times daily.   levothyroxine 50 MCG tablet Commonly known as:  SYNTHROID, LEVOTHROID TAKE 1 TABLET DAILY   LORazepam 0.5 MG tablet Commonly known as:  ATIVAN Take 0.5 mg 2 (two) times daily as needed by mouth for anxiety.   meclizine 25 MG tablet Commonly known as:  ANTIVERT Take 25 mg every 8 (eight) hours as needed by mouth for dizziness.   memantine 28 MG Cp24 24 hr capsule Commonly known as:  NAMENDA XR TAKE (1) CAPSULE DAILY   metoprolol tartrate 25 MG tablet Commonly known as:  LOPRESSOR TAKE (1/2) TABLET TWICE DAILY.   montelukast 10 MG tablet Commonly known as:  SINGULAIR TAKE ONE TABLET AT BEDTIME   OXYGEN Oxygen 3lpm 24/7, Increase to 4L as needed for shortness of breath with activity   polyethylene glycol packet Commonly known as:  MIRALAX / GLYCOLAX Take 8.5 g by mouth daily.   PROAIR HFA 108 (90 Base) MCG/ACT inhaler Generic drug:  albuterol INHALE 2 PUFFS BY MOUTH EVERY 6 HOURS AS NEEDED   ranitidine 150 MG tablet Commonly known as:  ZANTAC TAKE ONE TABLET AT BEDTIME   RHOPRESSA 0.02 % Soln Generic drug:  Netarsudil Dimesylate Place 1 drop into both eyes at bedtime.   sodium chloride 0.65 % Soln nasal spray Commonly known as:  OCEAN 2 puffs every 4-6 hours as needed for nasal congestion   valsartan-hydrochlorothiazide 80-12.5 MG tablet Commonly known  as:  DIOVAN-HCT TAKE 1 TABLET DAILY   Vitamin D (Ergocalciferol) 50000 units Caps capsule Commonly known as:  DRISDOL Take 1 capsule (50,000 Units total) by mouth every 7 (seven) days.          Objective:    BP 111/60   Pulse 75   Temp 98.5 F (36.9 C) (Oral)   Ht 5' 4" (1.626 m)   Wt 145 lb 6 oz (65.9 kg)   BMI 24.95 kg/m   Wt Readings from Last 3 Encounters:  03/25/17 145 lb 6 oz (65.9 kg)  03/19/17 146 lb (66.2 kg)  01/22/17 156 lb (70.8 kg)    Physical Exam  Constitutional: She is oriented to person, place, and time. She appears well-developed and well-nourished. No distress.  Eyes: Conjunctivae are normal.  Cardiovascular: Normal rate, regular rhythm, normal heart sounds and intact distal pulses.  No murmur heard. Pulmonary/Chest: Effort normal and breath sounds normal. No respiratory distress. She has no wheezes.  Abdominal: Soft. Bowel sounds are normal. She exhibits no distension. There is no tenderness (No abdominal pain). There is no rebound and no guarding.  Musculoskeletal: Normal range of motion. She exhibits no edema or tenderness.  Neurological: She is alert and oriented to person, place, and time. Coordination normal.  Skin: Skin is warm and dry. No rash noted. She is not diaphoretic.  Psychiatric: She has a normal mood and affect. Her behavior is normal.  Nursing note and vitals reviewed.       Assessment & Plan:   Problem List Items Addressed This Visit    None    Visit Diagnoses    Weight loss    -  Primary   Relevant Orders   Ambulatory referral to Gastroenterology   CBC with Differential/Platelet   CMP14+EGFR   TSH   Rectal bleeding       Relevant Orders   Ambulatory referral to Gastroenterology   CBC with Differential/Platelet   CMP14+EGFR   TSH   Hemorrhoids, internal       Relevant Medications   hydrocortisone (ANUSOL-HC) 25 MG suppository   Other Relevant Orders   Ambulatory referral to Gastroenterology   CBC with  Differential/Platelet   CMP14+EGFR   TSH       Follow up plan: Return if symptoms worsen or fail to improve.  Counseling provided for all of the vaccine components Orders Placed This Encounter  Procedures  . CBC with Differential/Platelet  . CMP14+EGFR  . TSH  . Ambulatory referral to Gastroenterology    Caryl Pina, MD [facility] 03/25/2017, 3:49 PM

## 2017-03-26 LAB — CBC WITH DIFFERENTIAL/PLATELET
Basophils Absolute: 0 10*3/uL (ref 0.0–0.2)
Basos: 0 %
EOS (ABSOLUTE): 0 10*3/uL (ref 0.0–0.4)
EOS: 1 %
HEMATOCRIT: 32.6 % — AB (ref 34.0–46.6)
HEMOGLOBIN: 10.8 g/dL — AB (ref 11.1–15.9)
Immature Grans (Abs): 0.1 10*3/uL (ref 0.0–0.1)
Immature Granulocytes: 1 %
LYMPHS ABS: 1.2 10*3/uL (ref 0.7–3.1)
Lymphs: 15 %
MCH: 28.3 pg (ref 26.6–33.0)
MCHC: 33.1 g/dL (ref 31.5–35.7)
MCV: 86 fL (ref 79–97)
Monocytes Absolute: 0.5 10*3/uL (ref 0.1–0.9)
Monocytes: 6 %
Neutrophils Absolute: 6.4 10*3/uL (ref 1.4–7.0)
Neutrophils: 77 %
Platelets: 324 10*3/uL (ref 150–379)
RBC: 3.81 x10E6/uL (ref 3.77–5.28)
RDW: 13.8 % (ref 12.3–15.4)
WBC: 8.1 10*3/uL (ref 3.4–10.8)

## 2017-03-26 LAB — CMP14+EGFR
ALK PHOS: 58 IU/L (ref 39–117)
ALT: 17 IU/L (ref 0–32)
AST: 20 IU/L (ref 0–40)
Albumin/Globulin Ratio: 1.4 (ref 1.2–2.2)
Albumin: 3.6 g/dL (ref 3.5–4.7)
BUN/Creatinine Ratio: 17 (ref 12–28)
BUN: 13 mg/dL (ref 8–27)
Bilirubin Total: 0.2 mg/dL (ref 0.0–1.2)
CALCIUM: 9.3 mg/dL (ref 8.7–10.3)
CO2: 28 mmol/L (ref 20–29)
CREATININE: 0.78 mg/dL (ref 0.57–1.00)
Chloride: 91 mmol/L — ABNORMAL LOW (ref 96–106)
GFR, EST AFRICAN AMERICAN: 80 mL/min/{1.73_m2} (ref >59)
GFR, EST NON AFRICAN AMERICAN: 69 mL/min/{1.73_m2} (ref >59)
GLOBULIN, TOTAL: 2.6 g/dL (ref 1.5–4.5)
Glucose: 181 mg/dL — ABNORMAL HIGH (ref 65–99)
Potassium: 3.9 mmol/L (ref 3.5–5.2)
SODIUM: 134 mmol/L (ref 134–144)
Total Protein: 6.2 g/dL (ref 6.0–8.5)

## 2017-03-26 LAB — TSH: TSH: 1.65 u[IU]/mL (ref 0.450–4.500)

## 2017-03-28 LAB — HGB A1C W/O EAG: HEMOGLOBIN A1C: 5.7 % — AB (ref 4.8–5.6)

## 2017-03-28 LAB — SPECIMEN STATUS REPORT

## 2017-03-30 ENCOUNTER — Other Ambulatory Visit: Payer: Self-pay | Admitting: Family Medicine

## 2017-04-13 ENCOUNTER — Telehealth: Payer: Self-pay | Admitting: Family Medicine

## 2017-04-13 DIAGNOSIS — J449 Chronic obstructive pulmonary disease, unspecified: Secondary | ICD-10-CM | POA: Diagnosis not present

## 2017-04-13 MED ORDER — CEPHALEXIN 500 MG PO CAPS: 500.0000 mg | ORAL_CAPSULE | Freq: Four times a day (QID) | ORAL | 0 refills | Status: DC

## 2017-04-13 NOTE — Telephone Encounter (Signed)
Patients daughter aware

## 2017-04-13 NOTE — Telephone Encounter (Signed)
Mom has been experiencing frequent urination, slight back pain and burning with urination for the last two days.  Cannot bring her in and wants to know if you will send an anbitiotic to [facility].  Please advise.

## 2017-04-13 NOTE — Telephone Encounter (Signed)
Sent in [facility] for the patient for UTI symptoms

## 2017-04-21 ENCOUNTER — Other Ambulatory Visit: Payer: Self-pay | Admitting: Cardiology

## 2017-04-21 ENCOUNTER — Other Ambulatory Visit: Payer: Self-pay | Admitting: Family Medicine

## 2017-04-30 ENCOUNTER — Other Ambulatory Visit: Payer: Self-pay | Admitting: Family Medicine

## 2017-05-01 NOTE — Telephone Encounter (Signed)
Last lipid 03/26/15   Dr D

## 2017-05-04 ENCOUNTER — Other Ambulatory Visit: Payer: Self-pay | Admitting: Cardiology

## 2017-05-04 ENCOUNTER — Ambulatory Visit: Payer: Medicare Other | Admitting: Adult Health

## 2017-05-04 ENCOUNTER — Other Ambulatory Visit: Payer: Self-pay | Admitting: Internal Medicine

## 2017-05-04 ENCOUNTER — Encounter: Payer: Self-pay | Admitting: Adult Health

## 2017-05-04 ENCOUNTER — Other Ambulatory Visit: Payer: Self-pay | Admitting: Family Medicine

## 2017-05-04 DIAGNOSIS — J9612 Chronic respiratory failure with hypercapnia: Secondary | ICD-10-CM

## 2017-05-04 DIAGNOSIS — J449 Chronic obstructive pulmonary disease, unspecified: Secondary | ICD-10-CM | POA: Diagnosis not present

## 2017-05-04 NOTE — Progress Notes (Signed)
@Patient  ID: Katie Bryant, female    DOB: 05-Apr-1928, 84 y.o.   MRN: [id number]  Chief Complaint  Patient presents with  . Follow-up    COPD     Referring provider: Dettinger, Fransisca Kaufmann, MD  HPI: 84 yo former smoker followed for gold 3 COPD and oxygen dependent respiratory failure Patient has dementia daughter helps her with her care,   TEST  PFT's 06/11/2007 FEV1 0.70 (34%) ratio 32 with 49% improvement p B2   05/04/2017 Follow up : COPD and O2 RF  Patient presents for a one-month follow-up.  Last visit patient had a COPD exacerbation she was treated with prednisone taper and Augmentin x10 days.  Patient says she is feeling better with decreased cough and shortness of breath.  Feels that she is returned back to her baseline.  She denies any increased cough, shortness of breath or edema..   No Known Allergies  Immunization History  Administered Date(s) Administered  . Influenza Split 10/07/2010, 09/19/2012, 09/28/2012  . Influenza Whole 10/07/2011  . Influenza, High Dose Seasonal PF 09/23/2013, 10/19/2014, 10/03/2015, 10/15/2016  . Influenza,inj,quad, With Preservative 10/06/2016  . Pneumococcal Conjugate-13 10/19/2014  . Pneumococcal Polysaccharide-23 01/06/2009  . Td 03/06/2016    Past Medical History:  Diagnosis Date  . Alzheimer's disease 03/29/2015  . Anxiety   . Aortic stenosis, mild   . Arthritis   . Asthma   . Back pain   . Breast cancer ([place])   . Carotid artery disease ([place]) 01/01/2017  . COPD (chronic obstructive pulmonary disease) ([place])   . Dementia   . Depression   . GERD (gastroesophageal reflux disease)   . H/O hiatal hernia   . Hypertension   . Hypothyroidism 06/25/2016  . Lymphadenopathy 10/07/2012  . Obesity   . Oxygen dependent   . Pneumonia   . Right hamstring muscle strain 04/26/2013  . Sleep apnea     Tobacco History: Social History   Tobacco Use  Smoking Status Former Smoker  . Packs/day: 1.00  . Years: 40.00  . Pack years:  40.00  . Types: Cigarettes  . Last attempt to quit: 02/11/1990  . Years since quitting: 27.2  Smokeless Tobacco Never Used   Counseling given: Not Answered   Outpatient Encounter Medications as of 05/04/2017  Medication Sig  . acetaminophen (TYLENOL) 500 MG tablet Take 500 mg by mouth every 6 (six) hours as needed (pain).   Marland Kitchen ALPRAZolam (XANAX) 1 MG tablet TAKE ONE TABLET AT BEDTIME  . ARIPiprazole (ABILIFY) 5 MG tablet Take 1 tablet (5 mg total) by mouth daily.  Marland Kitchen aspirin 81 MG tablet Take 81 mg by mouth daily.  Marland Kitchen atorvastatin (LIPITOR) 40 MG tablet TAKE 1 TABLET DAILY  . brimonidine (ALPHAGAN) 0.2 % ophthalmic solution Place 1 drop into both eyes daily.  . budesonide (PULMICORT) 0.5 MG/2ML nebulizer solution NEBULIZE 1 VIAL TWICE A DAY  . buPROPion (WELLBUTRIN SR) 200 MG 12 hr tablet Take 1 tablet (200 mg total) by mouth 2 (two) times daily.  . cetirizine (ZYRTEC) 10 MG tablet Take 10 mg by mouth daily as needed for allergies.  . citalopram (CELEXA) 20 MG tablet Take 1 tablet (20 mg total) by mouth daily.  Marland Kitchen dextromethorphan (DELSYM) 30 MG/5ML liquid Take 30 mg 2 (two) times daily as needed by mouth for cough.  . dextromethorphan-guaiFENesin (MUCINEX DM) 30-600 MG 12hr tablet Take 1 tablet 2 (two) times daily as needed by mouth for cough.  . fenofibrate micronized (LOFIBRA) 134 MG capsule TAKE (  1) CAPSULE DAILY BEFORE BREAKFAST.  . hydrochlorothiazide (MICROZIDE) 12.5 MG capsule Take 1 capsule (12.5 mg total) by mouth daily.  . hydrocortisone (ANUSOL-HC) 25 MG suppository Place 1 suppository (25 mg total) rectally 2 (two) times daily.  Marland Kitchen ipratropium-albuterol (DUONEB) 0.5-2.5 (3) MG/3ML SOLN NEBULIZE 1 VIAL 4 TIMES A DAY  . ketorolac (ACULAR) 0.5 % ophthalmic solution Place 1 drop into both eyes 4 (four) times daily.  Marland Kitchen levothyroxine (SYNTHROID, LEVOTHROID) 50 MCG tablet TAKE 1 TABLET DAILY  . LORazepam (ATIVAN) 0.5 MG tablet Take 0.5 mg 2 (two) times daily as needed by mouth for anxiety.   . meclizine (ANTIVERT) 25 MG tablet Take 25 mg every 8 (eight) hours as needed by mouth for dizziness.  . memantine (NAMENDA XR) 28 MG CP24 24 hr capsule TAKE (1) CAPSULE DAILY  . metoprolol tartrate (LOPRESSOR) 25 MG tablet TAKE (1/2) TABLET TWICE DAILY.  . montelukast (SINGULAIR) 10 MG tablet TAKE ONE TABLET AT BEDTIME  . montelukast (SINGULAIR) 10 MG tablet TAKE ONE TABLET AT BEDTIME  . OXYGEN Oxygen 3lpm 24/7, Increase to 4L as needed for shortness of breath with activity  . polyethylene glycol (MIRALAX / GLYCOLAX) packet Take 8.5 g by mouth daily.  Marland Kitchen PROAIR HFA 108 (90 Base) MCG/ACT inhaler INHALE 2 PUFFS BY MOUTH EVERY 6 HOURS AS NEEDED  . ranitidine (ZANTAC) 150 MG tablet TAKE ONE TABLET AT BEDTIME  . Respiratory Therapy Supplies (FLUTTER) DEVI As needed  . RHOPRESSA 0.02 % SOLN Place 1 drop into both eyes at bedtime.  . sodium chloride (OCEAN) 0.65 % SOLN nasal spray 2 puffs every 4-6 hours as needed for nasal congestion  . valsartan-hydrochlorothiazide (DIOVAN-HCT) 80-12.5 MG tablet TAKE 1 TABLET DAILY  . Vitamin D, Ergocalciferol, (DRISDOL) 50000 units CAPS capsule Take 1 capsule (50,000 Units total) by mouth every 7 (seven) days.  Marland Kitchen acetaminophen-codeine (TYLENOL #3) 300-30 MG tablet One every 4 hours as needed for cough (Patient not taking: Reported on 05/04/2017)  . [DISCONTINUED] cephALEXin (KEFLEX) 500 MG capsule Take 1 capsule (500 mg total) by mouth 4 (four) times daily. (Patient not taking: Reported on 05/04/2017)   No facility-administered encounter medications on file as of 05/04/2017.      Review of Systems  Constitutional:   No  weight loss, night sweats,  Fevers, chills, + fatigue, or  lassitude.  HEENT:   No headaches,  Difficulty swallowing,  Tooth/dental problems, or  Sore throat,                No sneezing, itching, ear ache, nasal congestion, post nasal drip,   CV:  No chest pain,  Orthopnea, PND, swelling in lower extremities, anasarca, dizziness,  palpitations, syncope.   GI  No heartburn, indigestion, abdominal pain, nausea, vomiting, diarrhea, change in bowel habits, loss of appetite, bloody stools.   Resp:   No chest wall deformity  Skin: no rash or lesions.  GU: no dysuria, change in color of urine, no urgency or frequency.  No flank pain, no hematuria   MS:  No joint pain or swelling.  No decreased range of motion.  No back pain.    Physical Exam  BP (!) 144/62 (BP Location: Left Arm, Cuff Size: Normal)   Pulse 79   Ht 5\' 4"  (1.626 m)   Wt 147 lb 12.8 oz (67 kg)   SpO2 93%   BMI 25.37 kg/m   GEN: A/Ox3; [place] , NAD    HEENT:  /AT,  EACs-clear, TMs-wnl, NOSE-clear, THROAT-clear, no lesions, no  postnasal drip or exudate noted.   NECK:  Supple w/ fair ROM; no JVD; normal carotid impulses w/o bruits; no thyromegaly or nodules palpated; no lymphadenopathy.    RESP  Decreased BS in bases  no accessory muscle use, no dullness to percussion  CARD:  RRR, no m/r/g, no peripheral edema, pulses intact, no cyanosis or clubbing.  GI:   Soft & nt; nml bowel sounds; no organomegaly or masses detected.   Musco: Warm bil, no deformities or joint swelling noted.   Neuro: alert, no focal deficits noted.    Skin: Warm, no lesions or rashes    Lab Results:  CBC  BMET  BNP No results found for: BNP  ProBNP  Imaging: No results found.   Assessment & Plan:   COPD GOLD III/ 02 dep  Recent flare now resolved   Plan  Patient Instructions  Continue on current regimen .  Follow up with Dr. [name]  In 3-4 months and As needed        Chronic respiratory failure with hypercapnia ([place]) Cont on O2      [name], NP 05/04/2017

## 2017-05-04 NOTE — Assessment & Plan Note (Signed)
Recent flare now resolved   Plan  Patient Instructions  Continue on current regimen .  Follow up with Dr. [name]  In 3-4 months and As needed

## 2017-05-04 NOTE — Patient Instructions (Signed)
Continue on current regimen .  Follow up with Dr. [name]  In 3-4 months and As needed

## 2017-05-04 NOTE — Assessment & Plan Note (Signed)
Cont on O2 .  

## 2017-05-04 NOTE — Telephone Encounter (Signed)
REFILL 

## 2017-05-04 NOTE — Progress Notes (Signed)
Chart and office note reviewed in detail  > agree with a/p as outlined    

## 2017-05-12 ENCOUNTER — Other Ambulatory Visit: Payer: Self-pay | Admitting: Pulmonary Disease

## 2017-05-13 DIAGNOSIS — J449 Chronic obstructive pulmonary disease, unspecified: Secondary | ICD-10-CM | POA: Diagnosis not present

## 2017-05-18 ENCOUNTER — Other Ambulatory Visit: Payer: Self-pay | Admitting: Family Medicine

## 2017-05-19 ENCOUNTER — Other Ambulatory Visit: Payer: Self-pay | Admitting: Cardiology

## 2017-05-26 ENCOUNTER — Other Ambulatory Visit: Payer: Self-pay | Admitting: Family Medicine

## 2017-05-27 ENCOUNTER — Encounter: Payer: Self-pay | Admitting: Family Medicine

## 2017-06-03 ENCOUNTER — Other Ambulatory Visit: Payer: Self-pay | Admitting: Family Medicine

## 2017-06-13 DIAGNOSIS — J449 Chronic obstructive pulmonary disease, unspecified: Secondary | ICD-10-CM | POA: Diagnosis not present

## 2017-06-15 ENCOUNTER — Other Ambulatory Visit: Payer: Self-pay | Admitting: Family Medicine

## 2017-06-22 ENCOUNTER — Other Ambulatory Visit: Payer: Self-pay | Admitting: Cardiology

## 2017-06-30 ENCOUNTER — Other Ambulatory Visit: Payer: Self-pay | Admitting: Family Medicine

## 2017-07-03 ENCOUNTER — Other Ambulatory Visit: Payer: Self-pay | Admitting: Family Medicine

## 2017-07-13 DIAGNOSIS — J449 Chronic obstructive pulmonary disease, unspecified: Secondary | ICD-10-CM | POA: Diagnosis not present

## 2017-07-15 ENCOUNTER — Other Ambulatory Visit: Payer: Self-pay | Admitting: Family Medicine

## 2017-07-17 ENCOUNTER — Encounter: Payer: Self-pay | Admitting: Family Medicine

## 2017-07-17 ENCOUNTER — Other Ambulatory Visit: Payer: Self-pay | Admitting: Family Medicine

## 2017-07-17 ENCOUNTER — Ambulatory Visit (INDEPENDENT_AMBULATORY_CARE_PROVIDER_SITE_OTHER): Payer: Medicare Other | Admitting: Family Medicine

## 2017-07-17 VITALS — BP 122/60 | HR 62 | Temp 100.7°F | Ht 64.0 in | Wt 149.0 lb

## 2017-07-17 DIAGNOSIS — N3000 Acute cystitis without hematuria: Secondary | ICD-10-CM | POA: Diagnosis not present

## 2017-07-17 DIAGNOSIS — R42 Dizziness and giddiness: Secondary | ICD-10-CM | POA: Diagnosis not present

## 2017-07-17 LAB — MICROSCOPIC EXAMINATION
Epithelial Cells (non renal): >10 /hpf — AB (ref 0–10)
RENAL EPITHEL UA: NONE SEEN /HPF

## 2017-07-17 LAB — URINALYSIS, COMPLETE
Bilirubin, UA: NEGATIVE
Glucose, UA: NEGATIVE
Ketones, UA: NEGATIVE
Nitrite, UA: NEGATIVE
PROTEIN UA: NEGATIVE
RBC, UA: NEGATIVE
Specific Gravity, UA: 1.015 (ref 1.005–1.030)
Urobilinogen, Ur: 0.2 mg/dL (ref 0.2–1.0)
pH, UA: 7 (ref 5.0–7.5)

## 2017-07-17 MED ORDER — CIPROFLOXACIN HCL 500 MG PO TABS: 500.0000 mg | ORAL_TABLET | Freq: Two times a day (BID) | ORAL | 0 refills | Status: DC

## 2017-07-17 NOTE — Progress Notes (Signed)
BP 122/60   Pulse 62   Temp (!) 100.7 F (38.2 C) (Oral)   Ht 5\' 4"  (1.626 m)   Wt 149 lb (67.6 kg)   BMI 25.58 kg/m    Subjective:    Patient ID: Katie Bryant, female    DOB: 10/26/1928, 84 y.o.   MRN: [id number]  HPI: Katie Bryant is a 84 y.o. female presenting on 07/17/2017 for Dizziness (x 1 week)   HPI Dizziness and fever Patient comes in complaining of dizziness and fever.  She did not know she had a fever until she came in today.  Her temperature is 100.7 here in the office today.  She denies any dysuria or hematuria or darkened urine.  She denies any cough or congestion or wheezing more than her normal.  She is on 2 L nasal cannula but has not required any more and besides that occasionally feeling dizzy and lightheaded she does not complain of any other symptoms including sinus pressure or ear pain.  Relevant past medical, surgical, family and social history reviewed and updated as indicated. Interim medical history since our last visit reviewed. Allergies and medications reviewed and updated.  Review of Systems  Constitutional: Negative for chills and fever.  HENT: Negative for congestion, ear discharge and ear pain.   Eyes: Negative for redness and visual disturbance.  Respiratory: Negative for chest tightness and shortness of breath.   Cardiovascular: Negative for chest pain and leg swelling.  Genitourinary: Negative for difficulty urinating, dysuria, flank pain, hematuria and urgency.  Musculoskeletal: Negative for back pain and gait problem.  Skin: Negative for rash.  Neurological: Positive for dizziness. Negative for light-headedness and headaches.  Psychiatric/Behavioral: Negative for agitation and behavioral problems.  All other systems reviewed and are negative.   Per HPI unless specifically indicated above   Allergies as of 07/17/2017   No Known Allergies     Medication List        Accurate as of 07/17/17  9:35 AM. Always use your most recent  med list.          acetaminophen 500 MG tablet Commonly known as:  TYLENOL Take 500 mg by mouth every 6 (six) hours as needed (pain).   ALPRAZolam 1 MG tablet Commonly known as:  XANAX TAKE ONE TABLET AT BEDTIME   ARIPiprazole 5 MG tablet Commonly known as:  ABILIFY TAKE 1 TABLET DAILY   aspirin 81 MG tablet Take 81 mg by mouth daily.   atorvastatin 40 MG tablet Commonly known as:  LIPITOR TAKE 1 TABLET DAILY   brimonidine 0.2 % ophthalmic solution Commonly known as:  ALPHAGAN Place 1 drop into both eyes daily.   budesonide 0.5 MG/2ML nebulizer solution Commonly known as:  PULMICORT NEBULIZE 1 VIAL TWICE A DAY   buPROPion 200 MG 12 hr tablet Commonly known as:  WELLBUTRIN SR Take 1 tablet (200 mg total) by mouth 2 (two) times daily.   cetirizine 10 MG tablet Commonly known as:  ZYRTEC Take 10 mg by mouth daily as needed for allergies.   ciprofloxacin 500 MG tablet Commonly known as:  CIPRO Take 1 tablet (500 mg total) by mouth 2 (two) times daily.   citalopram 20 MG tablet Commonly known as:  CELEXA TAKE 1 TABLET DAILY   DELSYM 30 MG/5ML liquid Generic drug:  dextromethorphan Take 30 mg 2 (two) times daily as needed by mouth for cough.   dextromethorphan-guaiFENesin 30-600 MG 12hr tablet Commonly known as:  MUCINEX DM Take 1 tablet  2 (two) times daily as needed by mouth for cough.   fenofibrate micronized 134 MG capsule Commonly known as:  LOFIBRA TAKE (1) CAPSULE DAILY BEFORE BREAKFAST.   FLUTTER Devi As needed   hydrochlorothiazide 12.5 MG capsule Commonly known as:  MICROZIDE TAKE (1) CAPSULE DAILY   ipratropium-albuterol 0.5-2.5 (3) MG/3ML Soln Commonly known as:  DUONEB NEBULIZE 1 VIAL 4 TIMES A DAY   ketorolac 0.5 % ophthalmic solution Commonly known as:  ACULAR Place 1 drop into both eyes 4 (four) times daily.   levothyroxine 50 MCG tablet Commonly known as:  SYNTHROID, LEVOTHROID TAKE 1 TABLET DAILY   LORazepam 0.5 MG  tablet Commonly known as:  ATIVAN Take 0.5 mg 2 (two) times daily as needed by mouth for anxiety.   meclizine 25 MG tablet Commonly known as:  ANTIVERT Take 25 mg every 8 (eight) hours as needed by mouth for dizziness.   memantine 28 MG Cp24 24 hr capsule Commonly known as:  NAMENDA XR TAKE (1) CAPSULE DAILY   metoprolol tartrate 25 MG tablet Commonly known as:  LOPRESSOR TAKE (1/2) TABLET TWICE DAILY.   montelukast 10 MG tablet Commonly known as:  SINGULAIR TAKE ONE TABLET AT BEDTIME   OXYGEN Oxygen 3lpm 24/7, Increase to 4L as needed for shortness of breath with activity   polyethylene glycol packet Commonly known as:  MIRALAX / GLYCOLAX Take 8.5 g by mouth daily.   PROAIR HFA 108 (90 Base) MCG/ACT inhaler Generic drug:  albuterol INHALE 2 PUFFS BY MOUTH EVERY 6 HOURS AS NEEDED   ranitidine 150 MG tablet Commonly known as:  ZANTAC TAKE ONE TABLET AT BEDTIME   RHOPRESSA 0.02 % Soln Generic drug:  Netarsudil Dimesylate Place 1 drop into both eyes at bedtime.   sodium chloride 0.65 % Soln nasal spray Commonly known as:  OCEAN 2 puffs every 4-6 hours as needed for nasal congestion   valsartan-hydrochlorothiazide 80-12.5 MG tablet Commonly known as:  DIOVAN-HCT TAKE 1 TABLET DAILY   Vitamin D (Ergocalciferol) 50000 units Caps capsule Commonly known as:  DRISDOL Take 1 capsule (50,000 Units total) by mouth every 7 (seven) days.          Objective:    BP 122/60   Pulse 62   Temp (!) 100.7 F (38.2 C) (Oral)   Ht 5\' 4"  (1.626 m)   Wt 149 lb (67.6 kg)   BMI 25.58 kg/m   Wt Readings from Last 3 Encounters:  07/17/17 149 lb (67.6 kg)  05/04/17 147 lb 12.8 oz (67 kg)  03/25/17 145 lb 6 oz (65.9 kg)    Physical Exam  Constitutional: She is oriented to person, place, and time. She appears well-developed and well-nourished. No distress.  HENT:  Right Ear: External ear normal.  Left Ear: External ear normal.  Nose: Nose normal.  Mouth/Throat: Oropharynx  is clear and moist. No oropharyngeal exudate.  Eyes: Pupils are equal, round, and reactive to light. Conjunctivae and EOM are normal.  Neck: Neck supple. No thyromegaly present.  Cardiovascular: Normal rate, regular rhythm, normal heart sounds and intact distal pulses.  No murmur heard. Pulmonary/Chest: Effort normal and breath sounds normal. No respiratory distress. She has no wheezes.  Musculoskeletal: Normal range of motion. She exhibits no edema.  Lymphadenopathy:    She has no cervical adenopathy.  Neurological: She is alert and oriented to person, place, and time. Coordination normal.  Skin: Skin is warm and dry. No rash noted. She is not diaphoretic.  Psychiatric: She has a normal  mood and affect. Her behavior is normal.  Nursing note and vitals reviewed.   Urinalysis: 0-5 WBCs, 0-2 RBCs, greater than 10 epithelial cells few bacteria    Assessment & Plan:   Problem List Items Addressed This Visit    None    Visit Diagnoses    Dizziness    -  Primary   Relevant Medications   ciprofloxacin (CIPRO) 500 MG tablet   Other Relevant Orders   Urinalysis, Complete   Urine Culture   Acute cystitis without hematuria       Relevant Medications   ciprofloxacin (CIPRO) 500 MG tablet   Other Relevant Orders   Urine Culture       Follow up plan: Return if symptoms worsen or fail to improve.  Counseling provided for all of the vaccine components Orders Placed This Encounter  Procedures  . Urine Culture  . Urinalysis, Complete    Caryl Pina, MD [facility] 07/17/2017, 9:35 AM

## 2017-07-18 LAB — URINE CULTURE: ORGANISM ID, BACTERIA: NO GROWTH

## 2017-07-20 NOTE — Progress Notes (Signed)
HPI The patient presents for followup of aortic stenosis. She has severe COPD on O2.  She is moved in with her daughter because she was having continued falls and weight loss and forgetting to take her medicines.  She has worsening dementia.  She has been lightheaded.  She was treated for urinary infection recently but this did not seem to improve the dizziness.  She denies any ongoing acute shortness of breath.  She is chronically short of breath on O2.  She denies any chest pressure, neck or arm discomfort.  She is not falling currently.  She is not having any palpitations, presyncope or syncope.   No Known Allergies  Current Outpatient Medications  Medication Sig Dispense Refill  . acetaminophen (TYLENOL) 500 MG tablet Take 500 mg by mouth every 6 (six) hours as needed (pain).     Marland Kitchen ALPRAZolam (XANAX) 1 MG tablet TAKE ONE TABLET AT BEDTIME 30 tablet 0  . ARIPiprazole (ABILIFY) 5 MG tablet TAKE 1 TABLET DAILY 30 tablet 0  . aspirin 81 MG tablet Take 81 mg by mouth daily.    Marland Kitchen atorvastatin (LIPITOR) 40 MG tablet Take 40 mg by mouth daily.    . brimonidine (ALPHAGAN) 0.2 % ophthalmic solution Place 1 drop into both eyes daily.    . budesonide (PULMICORT) 0.5 MG/2ML nebulizer solution NEBULIZE 1 VIAL TWICE A DAY 120 mL 5  . buPROPion (WELLBUTRIN SR) 200 MG 12 hr tablet Take 1 tablet (200 mg total) by mouth 2 (two) times daily. 180 tablet 1  . cetirizine (ZYRTEC) 10 MG tablet Take 10 mg by mouth daily as needed for allergies.    . ciprofloxacin (CIPRO) 500 MG tablet Take 1 tablet (500 mg total) by mouth 2 (two) times daily. 20 tablet 0  . citalopram (CELEXA) 20 MG tablet TAKE 1 TABLET DAILY 30 tablet 2  . fenofibrate micronized (LOFIBRA) 134 MG capsule Take 1 capsule (134 mg total) by mouth daily before breakfast. 90 capsule 1  . ipratropium-albuterol (DUONEB) 0.5-2.5 (3) MG/3ML SOLN NEBULIZE 1 VIAL 4 TIMES A DAY 360 mL 5  . ketorolac (ACULAR) 0.5 % ophthalmic solution Place 1 drop into both  eyes 4 (four) times daily.    Marland Kitchen levothyroxine (SYNTHROID, LEVOTHROID) 50 MCG tablet TAKE 1 TABLET DAILY 30 tablet 11  . LORazepam (ATIVAN) 0.5 MG tablet Take 0.5 mg 2 (two) times daily as needed by mouth for anxiety.    . meclizine (ANTIVERT) 25 MG tablet Take 25 mg every 8 (eight) hours as needed by mouth for dizziness.    . memantine (NAMENDA XR) 28 MG CP24 24 hr capsule TAKE (1) CAPSULE DAILY 30 capsule 2  . metoprolol tartrate (LOPRESSOR) 25 MG tablet TAKE (1/2) TABLET TWICE DAILY. 30 tablet 6  . montelukast (SINGULAIR) 10 MG tablet TAKE ONE TABLET AT BEDTIME 30 tablet 4  . polyethylene glycol (MIRALAX / GLYCOLAX) packet Take 8.5 g by mouth daily.    Marland Kitchen PROAIR HFA 108 (90 Base) MCG/ACT inhaler INHALE 2 PUFFS BY MOUTH EVERY 6 HOURS AS NEEDED 8.5 g 5  . ranitidine (ZANTAC) 150 MG tablet TAKE ONE TABLET AT BEDTIME 30 tablet 3  . RHOPRESSA 0.02 % SOLN Place 1 drop into both eyes at bedtime.    . sodium chloride (OCEAN) 0.65 % SOLN nasal spray 2 puffs every 4-6 hours as needed for nasal congestion    . valsartan-hydrochlorothiazide (DIOVAN-HCT) 80-12.5 MG tablet TAKE 1 TABLET DAILY 90 tablet 1  . Vitamin D, Ergocalciferol, (DRISDOL) 50000  units CAPS capsule Take 1 capsule (50,000 Units total) by mouth every 7 (seven) days. 12 capsule 0  . dextromethorphan (DELSYM) 30 MG/5ML liquid Take 30 mg 2 (two) times daily as needed by mouth for cough.    . dextromethorphan-guaiFENesin (MUCINEX DM) 30-600 MG 12hr tablet Take 1 tablet 2 (two) times daily as needed by mouth for cough.    . OXYGEN Oxygen 3lpm 24/7, Increase to 4L as needed for shortness of breath with activity    . Respiratory Therapy Supplies (FLUTTER) DEVI As needed     No current facility-administered medications for this visit.     Past Medical History:  Diagnosis Date  . Alzheimer's disease 03/29/2015  . Anxiety   . Aortic stenosis, mild   . Arthritis   . Asthma   . Back pain   . Breast cancer ([place])   . Carotid artery disease ([place])  01/01/2017  . COPD (chronic obstructive pulmonary disease) ([place])   . Dementia   . Depression   . GERD (gastroesophageal reflux disease)   . H/O hiatal hernia   . Hypertension   . Hypothyroidism 06/25/2016  . Lymphadenopathy 10/07/2012  . Obesity   . Oxygen dependent   . Pneumonia   . Right hamstring muscle strain 04/26/2013  . Sleep apnea     Past Surgical History:  Procedure Laterality Date  . APPENDECTOMY    . BREAST RECONSTRUCTION    . CARDIAC CATHETERIZATION     EF 55-60%  . MASTECTOMY  1980  . TONSILLECTOMY AND ADENOIDECTOMY  1942    ROS:  As stated in the HPI and negative for all other systems.  PHYSICAL EXAM BP 138/60   Pulse 68   Ht 5\' 4"  (1.626 m)   Wt 150 lb (68 kg)   BMI 25.75 kg/m   GENERAL:  Frail appearing NECK:  No jugular venous distention, waveform within normal limits, carotid upstroke brisk and symmetric, no bruits, no thyromegaly LUNGS:  Clear to auscultation bilaterally CHEST:  Unremarkable HEART:  PMI not displaced or sustained,S1 and S2 within normal limits, no S3, no S4, no clicks, no rubs, 2 out of 6 apical systolic murmur radiating slightly at the aortic outflow tract, no diastolic murmurs ABD:  Flat, positive bowel sounds normal in frequency in pitch, no bruits, no rebound, no guarding, no midline pulsatile mass, no hepatomegaly, no splenomegaly EXT:  2 plus pulses throughout, trace ankle edema, no cyanosis no clubbing  EKG: NA  ASSESSMENT AND PLAN  Aortic stenosis - This has been mild aortic stenosis.   No further imaging is planned.    HTN (hypertension) -  Given the dizziness I would like to try to reduce her medications particularly given her significant comorbidities.  I will stop the hydrochlorothiazide.  She does have some very mild leg edema but I do not think this needs to be medicated further.  Carotid stenosis - This was mild last year.  No further imaging is needed.   No change in therapy or further imaging is indicated.

## 2017-07-21 ENCOUNTER — Other Ambulatory Visit: Payer: Self-pay | Admitting: Cardiology

## 2017-07-22 ENCOUNTER — Encounter: Payer: Self-pay | Admitting: Cardiology

## 2017-07-22 ENCOUNTER — Ambulatory Visit: Payer: Medicare Other | Admitting: Cardiology

## 2017-07-22 VITALS — BP 138/60 | HR 68 | Ht 64.0 in | Wt 150.0 lb

## 2017-07-22 DIAGNOSIS — I350 Nonrheumatic aortic (valve) stenosis: Secondary | ICD-10-CM | POA: Diagnosis not present

## 2017-07-22 DIAGNOSIS — I10 Essential (primary) hypertension: Secondary | ICD-10-CM | POA: Diagnosis not present

## 2017-07-22 NOTE — Patient Instructions (Signed)
Medication Instructions:  Please continue your Hydrochlorothiazide. Continue all other medications as listed.  Follow-Up: Follow up in 1 year with Dr. Percival Spanish.  You will receive a letter in the mail 2 months before you are due.  Please call us when you receive this letter to schedule your follow up appointment.  If you need a refill on your cardiac medications before your next appointment, please call your pharmacy.  Thank you for choosing [facility]!!

## 2017-07-27 ENCOUNTER — Other Ambulatory Visit: Payer: Self-pay | Admitting: Family Medicine

## 2017-07-27 ENCOUNTER — Other Ambulatory Visit: Payer: Self-pay | Admitting: Family

## 2017-07-30 NOTE — Telephone Encounter (Signed)
Last lipid 04/05/15  Dr Dettinger

## 2017-08-13 DIAGNOSIS — J449 Chronic obstructive pulmonary disease, unspecified: Secondary | ICD-10-CM | POA: Diagnosis not present

## 2017-08-15 ENCOUNTER — Other Ambulatory Visit: Payer: Self-pay | Admitting: Family Medicine

## 2017-08-17 NOTE — Telephone Encounter (Signed)
Last seen 07/22/17   Dr Dettinger

## 2017-08-24 ENCOUNTER — Other Ambulatory Visit: Payer: Self-pay | Admitting: Family Medicine

## 2017-08-25 DIAGNOSIS — H40013 Open angle with borderline findings, low risk, bilateral: Secondary | ICD-10-CM | POA: Diagnosis not present

## 2017-08-25 DIAGNOSIS — H40053 Ocular hypertension, bilateral: Secondary | ICD-10-CM | POA: Diagnosis not present

## 2017-09-02 ENCOUNTER — Other Ambulatory Visit: Payer: Self-pay | Admitting: Family Medicine

## 2017-09-02 ENCOUNTER — Other Ambulatory Visit: Payer: Self-pay | Admitting: Internal Medicine

## 2017-09-13 DIAGNOSIS — J449 Chronic obstructive pulmonary disease, unspecified: Secondary | ICD-10-CM | POA: Diagnosis not present

## 2017-09-14 ENCOUNTER — Encounter (INDEPENDENT_AMBULATORY_CARE_PROVIDER_SITE_OTHER): Payer: Medicare Other | Admitting: Ophthalmology

## 2017-09-14 ENCOUNTER — Other Ambulatory Visit: Payer: Self-pay | Admitting: Family Medicine

## 2017-09-14 DIAGNOSIS — H353132 Nonexudative age-related macular degeneration, bilateral, intermediate dry stage: Secondary | ICD-10-CM

## 2017-09-14 DIAGNOSIS — H59033 Cystoid macular edema following cataract surgery, bilateral: Secondary | ICD-10-CM

## 2017-09-14 DIAGNOSIS — H35033 Hypertensive retinopathy, bilateral: Secondary | ICD-10-CM | POA: Diagnosis not present

## 2017-09-14 DIAGNOSIS — I10 Essential (primary) hypertension: Secondary | ICD-10-CM | POA: Diagnosis not present

## 2017-09-14 DIAGNOSIS — H43813 Vitreous degeneration, bilateral: Secondary | ICD-10-CM

## 2017-09-15 ENCOUNTER — Other Ambulatory Visit: Payer: Self-pay | Admitting: Family Medicine

## 2017-09-16 DIAGNOSIS — H903 Sensorineural hearing loss, bilateral: Secondary | ICD-10-CM | POA: Diagnosis not present

## 2017-09-17 ENCOUNTER — Encounter: Payer: Self-pay | Admitting: Family Medicine

## 2017-09-17 ENCOUNTER — Ambulatory Visit (INDEPENDENT_AMBULATORY_CARE_PROVIDER_SITE_OTHER): Payer: Medicare Other | Admitting: Family Medicine

## 2017-09-17 VITALS — BP 139/60 | HR 59 | Temp 97.6°F | Ht 64.0 in | Wt 148.2 lb

## 2017-09-17 DIAGNOSIS — I10 Essential (primary) hypertension: Secondary | ICD-10-CM

## 2017-09-17 DIAGNOSIS — F419 Anxiety disorder, unspecified: Secondary | ICD-10-CM | POA: Diagnosis not present

## 2017-09-17 DIAGNOSIS — E782 Mixed hyperlipidemia: Secondary | ICD-10-CM | POA: Diagnosis not present

## 2017-09-17 DIAGNOSIS — J449 Chronic obstructive pulmonary disease, unspecified: Secondary | ICD-10-CM | POA: Diagnosis not present

## 2017-09-17 DIAGNOSIS — F339 Major depressive disorder, recurrent, unspecified: Secondary | ICD-10-CM | POA: Diagnosis not present

## 2017-09-17 DIAGNOSIS — K219 Gastro-esophageal reflux disease without esophagitis: Secondary | ICD-10-CM

## 2017-09-17 DIAGNOSIS — E039 Hypothyroidism, unspecified: Secondary | ICD-10-CM

## 2017-09-17 MED ORDER — ALPRAZOLAM 1 MG PO TABS: 1.0000 mg | ORAL_TABLET | Freq: Every day | ORAL | 5 refills | Status: DC

## 2017-09-17 NOTE — Progress Notes (Signed)
BP 139/60   Pulse (!) 59   Temp 97.6 F (36.4 C) (Oral)   Ht 5' 4"  (1.626 m)   Wt 148 lb 3.2 oz (67.2 kg)   BMI 25.44 kg/m    Subjective:    Patient ID: Katie Bryant, female    DOB: 1928-08-01, 84 y.o.   MRN: [id number]  HPI: Katie Bryant is a 84 y.o. female presenting on 09/17/2017 for Medication Refill (xanax )   HPI Hypothyroidism recheck Patient is coming in for thyroid recheck today as well. They deny any issues with hair changes or heat or cold problems or diarrhea or constipation. They deny any chest pain or palpitations. They are currently on levothyroxine 63mcrograms   Hyperlipidemia Patient is coming in for recheck of his hyperlipidemia. The patient is currently taking Lipitor and fenofibrate. They deny any issues with myalgias or history of liver damage from it. They deny any focal numbness or weakness or chest pain.   Hypertension Patient is currently on Diovan HCT, and their blood pressure today is 139/60. Patient denies any lightheadedness or dizziness. Patient denies headaches, blurred vision, chest pains, shortness of breath, or weakness. Denies any side effects from medication and is content with current medication.   COPD Patient is coming in for COPD recheck today.  He is currently on Pulmicort and DuoNeb and Singulair.  He has a mild chronic cough but denies any major coughing spells or wheezing spells.  He has 1nighttime symptoms per week and 1daytime symptoms per week currently.  Patient's breathing has been doing much better currently and daughter is happy with where she is.  GERD Patient is currently on Zantac.  She denies any major symptoms or abdominal pain or belching or burping. She denies any blood in her stool or lightheadedness or dizziness.   Depression and anxiety Patient has chronic depression secondary to dementia and her health issues.  She currently uses alprazolam and Abilify and Wellbutrin and is here with her daughter who says that  things are going very well with these medications and that her chronic recurrent depression is under control. Depression screen [facility] [place] 09/17/2017 07/17/2017 03/25/2017 01/22/2017 11/24/2016  Decreased Interest 0 0 1 0 0  Down, Depressed, Hopeless 0 0 0 0 0  PHQ - 2 Score 0 0 1 0 0  Altered sleeping - - - - -  Tired, decreased energy - - - - -  Change in appetite - - - - -  Feeling bad or failure about yourself  - - - - -  Trouble concentrating - - - - -  Moving slowly or fidgety/restless - - - - -  Suicidal thoughts - - - - -  PHQ-9 Score - - - - -  Some recent data might be hidden     Relevant past medical, surgical, family and social history reviewed and updated as indicated. Interim medical history since our last visit reviewed. Allergies and medications reviewed and updated.  Review of Systems  Constitutional: Negative for chills and fever.  Eyes: Negative for redness and visual disturbance.  Respiratory: Negative for chest tightness and shortness of breath.   Cardiovascular: Negative for chest pain and leg swelling.  Musculoskeletal: Negative for back pain and gait problem.  Skin: Negative for rash.  Neurological: Negative for light-headedness and headaches.  Psychiatric/Behavioral: Positive for decreased concentration and dysphoric mood. Negative for agitation, behavioral problems, self-injury, sleep disturbance and suicidal ideas. The patient is nervous/anxious.   All other systems reviewed and  are negative.   Per HPI unless specifically indicated above   Allergies as of 09/17/2017   No Known Allergies     Medication List        Accurate as of 09/17/17 10:21 AM. Always use your most recent med list.          acetaminophen 500 MG tablet Commonly known as:  TYLENOL Take 500 mg by mouth every 6 (six) hours as needed (pain).   ALPRAZolam 1 MG tablet Commonly known as:  XANAX Take 1 tablet (1 mg total) by mouth at bedtime.   ARIPiprazole 5 MG tablet Commonly known  as:  ABILIFY TAKE 1 TABLET DAILY   aspirin 81 MG tablet Take 81 mg by mouth daily.   atorvastatin 40 MG tablet Commonly known as:  LIPITOR Take 40 mg by mouth daily.   atorvastatin 40 MG tablet Commonly known as:  LIPITOR TAKE 1 TABLET DAILY   brimonidine 0.2 % ophthalmic solution Commonly known as:  ALPHAGAN Place 1 drop into both eyes daily.   budesonide 0.5 MG/2ML nebulizer solution Commonly known as:  PULMICORT NEBULIZE 1 VIAL TWICE A DAY   buPROPion 200 MG 12 hr tablet Commonly known as:  WELLBUTRIN SR TAKE (1) TABLET TWICE A DAY.   cetirizine 10 MG tablet Commonly known as:  ZYRTEC Take 10 mg by mouth daily as needed for allergies.   citalopram 20 MG tablet Commonly known as:  CELEXA TAKE 1 TABLET DAILY   DELSYM 30 MG/5ML liquid Generic drug:  dextromethorphan Take 30 mg 2 (two) times daily as needed by mouth for cough.   dextromethorphan-guaiFENesin 30-600 MG 12hr tablet Commonly known as:  MUCINEX DM Take 1 tablet 2 (two) times daily as needed by mouth for cough.   fenofibrate micronized 134 MG capsule Commonly known as:  LOFIBRA Take 1 capsule (134 mg total) by mouth daily before breakfast.   FLUTTER Devi As needed   ipratropium-albuterol 0.5-2.5 (3) MG/3ML Soln Commonly known as:  DUONEB NEBULIZE 1 VIAL 4 TIMES A DAY   ketorolac 0.5 % ophthalmic solution Commonly known as:  ACULAR Place 1 drop into both eyes 4 (four) times daily.   levothyroxine 50 MCG tablet Commonly known as:  SYNTHROID, LEVOTHROID TAKE 1 TABLET DAILY   LORazepam 0.5 MG tablet Commonly known as:  ATIVAN Take 0.5 mg 2 (two) times daily as needed by mouth for anxiety.   meclizine 25 MG tablet Commonly known as:  ANTIVERT Take 25 mg every 8 (eight) hours as needed by mouth for dizziness.   memantine 28 MG Cp24 24 hr capsule Commonly known as:  NAMENDA XR TAKE (1) CAPSULE DAILY   metoprolol tartrate 25 MG tablet Commonly known as:  LOPRESSOR TAKE (1/2) TABLET TWICE  DAILY.   montelukast 10 MG tablet Commonly known as:  SINGULAIR TAKE ONE TABLET AT BEDTIME   OXYGEN Oxygen 3lpm 24/7, Increase to 4L as needed for shortness of breath with activity   polyethylene glycol packet Commonly known as:  MIRALAX / GLYCOLAX Take 8.5 g by mouth daily.   PROAIR HFA 108 (90 Base) MCG/ACT inhaler Generic drug:  albuterol INHALE 2 PUFFS BY MOUTH EVERY 6 HOURS AS NEEDED   ranitidine 150 MG tablet Commonly known as:  ZANTAC TAKE ONE TABLET AT BEDTIME   RHOPRESSA 0.02 % Soln Generic drug:  Netarsudil Dimesylate Place 1 drop into both eyes at bedtime.   sodium chloride 0.65 % Soln nasal spray Commonly known as:  OCEAN 2 puffs every 4-6 hours as  needed for nasal congestion   valsartan-hydrochlorothiazide 80-12.5 MG tablet Commonly known as:  DIOVAN-HCT TAKE 1 TABLET DAILY   Vitamin D (Ergocalciferol) 50000 units Caps capsule Commonly known as:  DRISDOL Take 1 capsule (50,000 Units total) by mouth every 7 (seven) days.          Objective:    BP 139/60   Pulse (!) 59   Temp 97.6 F (36.4 C) (Oral)   Ht 5' 4"  (1.626 m)   Wt 148 lb 3.2 oz (67.2 kg)   BMI 25.44 kg/m   Wt Readings from Last 3 Encounters:  09/17/17 148 lb 3.2 oz (67.2 kg)  07/22/17 150 lb (68 kg)  07/17/17 149 lb (67.6 kg)    Physical Exam  Constitutional: She is oriented to person, place, and time. She appears well-developed and well-nourished. No distress.  Eyes: Conjunctivae are normal.  Neck: Neck supple. No thyromegaly present.  Cardiovascular: Normal rate, regular rhythm, normal heart sounds and intact distal pulses.  No murmur heard. Pulmonary/Chest: Effort normal and breath sounds normal. No respiratory distress. She has no wheezes.  Musculoskeletal: Normal range of motion. She exhibits no edema or tenderness.  Lymphadenopathy:    She has no cervical adenopathy.  Neurological: She is alert and oriented to person, place, and time. Coordination normal.  Skin: Skin is  warm and dry. No rash noted. She is not diaphoretic.  Psychiatric: She has a normal mood and affect. Her behavior is normal.  Nursing note and vitals reviewed.       Assessment & Plan:   Problem List Items Addressed This Visit      Cardiovascular and Mediastinum   HTN (hypertension)   Relevant Orders   CMP14+EGFR (Completed)     Respiratory   COPD GOLD III/ 02 dep    Relevant Orders   CBC with Differential/Platelet (Completed)     Digestive   GERD (gastroesophageal reflux disease)   Relevant Orders   CBC with Differential/Platelet (Completed)     Endocrine   Hypothyroidism   Relevant Orders   TSH (Completed)     Other   Hyperlipidemia   Relevant Orders   Lipid panel (Completed)   Depression, recurrent ([place]) - Primary   Relevant Medications   ALPRAZolam (XANAX) 1 MG tablet   Anxiety   Relevant Medications   ALPRAZolam (XANAX) 1 MG tablet       Follow up plan: Return in about 6 months (around 03/18/2018), or if symptoms worsen or fail to improve, for Recheck thyroid and depression and anxiety and COPD.  Counseling provided for all of the vaccine components Orders Placed This Encounter  Procedures  . CBC with Differential/Platelet  . CMP14+EGFR  . Lipid panel  . TSH    Caryl Pina, MD [facility] 09/17/2017, 10:21 AM

## 2017-09-18 LAB — CBC WITH DIFFERENTIAL/PLATELET
Basophils Absolute: 0.1 10*3/uL (ref 0.0–0.2)
Basos: 1 %
EOS (ABSOLUTE): 0.2 10*3/uL (ref 0.0–0.4)
EOS: 2 %
HEMATOCRIT: 34.4 % (ref 34.0–46.6)
HEMOGLOBIN: 11.3 g/dL (ref 11.1–15.9)
IMMATURE GRANS (ABS): 0 10*3/uL (ref 0.0–0.1)
Immature Granulocytes: 0 %
Lymphocytes Absolute: 2 10*3/uL (ref 0.7–3.1)
Lymphs: 21 %
MCH: 28.4 pg (ref 26.6–33.0)
MCHC: 32.8 g/dL (ref 31.5–35.7)
MCV: 86 fL (ref 79–97)
MONOCYTES: 8 %
Monocytes Absolute: 0.8 10*3/uL (ref 0.1–0.9)
NEUTROS ABS: 6.5 10*3/uL (ref 1.4–7.0)
Neutrophils: 68 %
Platelets: 231 10*3/uL (ref 150–450)
RBC: 3.98 x10E6/uL (ref 3.77–5.28)
RDW: 11.6 % — ABNORMAL LOW (ref 12.3–15.4)
WBC: 9.6 10*3/uL (ref 3.4–10.8)

## 2017-09-18 LAB — CMP14+EGFR
ALBUMIN: 4 g/dL (ref 3.5–4.7)
ALK PHOS: 42 IU/L (ref 39–117)
ALT: 19 IU/L (ref 0–32)
AST: 22 IU/L (ref 0–40)
Albumin/Globulin Ratio: 1.7 (ref 1.2–2.2)
BUN / CREAT RATIO: 19 (ref 12–28)
BUN: 18 mg/dL (ref 8–27)
Bilirubin Total: 0.4 mg/dL (ref 0.0–1.2)
CO2: 28 mmol/L (ref 20–29)
Calcium: 9.7 mg/dL (ref 8.7–10.3)
Chloride: 101 mmol/L (ref 96–106)
Creatinine, Ser: 0.94 mg/dL (ref 0.57–1.00)
GFR calc non Af Amer: 55 mL/min/{1.73_m2} — ABNORMAL LOW (ref >59)
GFR, EST AFRICAN AMERICAN: 64 mL/min/{1.73_m2} (ref >59)
GLOBULIN, TOTAL: 2.3 g/dL (ref 1.5–4.5)
Glucose: 78 mg/dL (ref 65–99)
Potassium: 4.5 mmol/L (ref 3.5–5.2)
SODIUM: 138 mmol/L (ref 134–144)
TOTAL PROTEIN: 6.3 g/dL (ref 6.0–8.5)

## 2017-09-18 LAB — LIPID PANEL
CHOL/HDL RATIO: 1.9 ratio (ref 0.0–4.4)
CHOLESTEROL TOTAL: 146 mg/dL (ref 100–199)
HDL: 78 mg/dL (ref >39)
LDL Calculated: 58 mg/dL (ref 0–99)
Triglycerides: 50 mg/dL (ref 0–149)
VLDL Cholesterol Cal: 10 mg/dL (ref 5–40)

## 2017-09-18 LAB — TSH: TSH: 3.27 u[IU]/mL (ref 0.450–4.500)

## 2017-09-30 ENCOUNTER — Ambulatory Visit (INDEPENDENT_AMBULATORY_CARE_PROVIDER_SITE_OTHER): Payer: Medicare Other

## 2017-09-30 DIAGNOSIS — Z23 Encounter for immunization: Secondary | ICD-10-CM | POA: Diagnosis not present

## 2017-10-05 ENCOUNTER — Encounter: Payer: Self-pay | Admitting: Internal Medicine

## 2017-10-05 ENCOUNTER — Ambulatory Visit: Payer: Medicare Other | Admitting: Internal Medicine

## 2017-10-05 VITALS — BP 120/64 | HR 67 | Ht 64.0 in | Wt 149.0 lb

## 2017-10-05 DIAGNOSIS — J9612 Chronic respiratory failure with hypercapnia: Secondary | ICD-10-CM | POA: Diagnosis not present

## 2017-10-05 DIAGNOSIS — J449 Chronic obstructive pulmonary disease, unspecified: Secondary | ICD-10-CM | POA: Diagnosis not present

## 2017-10-05 DIAGNOSIS — K219 Gastro-esophageal reflux disease without esophagitis: Secondary | ICD-10-CM

## 2017-10-05 MED ORDER — FAMOTIDINE 20 MG PO TABS: 20.0000 mg | ORAL_TABLET | Freq: Every day | ORAL | 11 refills | Status: DC

## 2017-10-05 NOTE — Assessment & Plan Note (Signed)
-   sats 90% RA 03/11/2011     - Dr Annamaria Boots started 02 around 2010    - 3 lpm 24/7 except 4lpm with activity as of 03/02/2013     - HC03 trending in low 30's since 2012 so likely hypercarbic component as well   - 02/24/2014   Walked RA x one lap @ 185 stopped due to  desat to 83% corrected on 4lpm    - 12/05/2014   Walked 3lpm pulsed x 2 laps @ 185 ft each stopped due to sob with sat 89% at moderate pace    - 03/06/2015   Walked 4lpm 2 laps @ 185 ft each stopped due to  Sob/ desat to 86% at pace faster than her nl  - 11/04/2016  HCO3  =32  -  12/08/2016  Apri notified "does not qualify for POC "  - not clear whether titration attempted.     rx as of 10/05/2017  = 3lpm 24/7  And titrate to keep > 90% with activity    Adequate control on present rx, reviewed in detail with pt > no change in rx needed     I had an extended discussion with the patien and her daughter  reviewing all relevant studies completed to date and  lasting 15 to 20 minutes of a 25 minute visit    See device teaching which extended face to face time for this visit   Each maintenance medication was reviewed in detail including most importantly the difference between maintenance and prns and under what circumstances the prns are to be triggered using an action plan format that is not reflected in the computer generated alphabetically organized AVS but trather by a customized med calendar that reflects the AVS meds with confirmed 100% correlation.   In addition, Please see AVS for unique instructions that I personally wrote and verbalized to the the pt in detail and then reviewed with pt  by my nurse highlighting any  changes in therapy recommended at today's visit to their plan of care.

## 2017-10-05 NOTE — Assessment & Plan Note (Signed)
-   PFT's 06/11/2007  FEV1   0.70 (34%) ratio 32 with 49% improvement p B2    - HFA 75% effective p coaching 06/20/2011 > 50% 06/24/2011  -Med calendar 09/27/2013 > did not bring as requested 06/09/14 , 06/05/2015 redone  - improved reconciliation 09/07/2014 and 03/07/2015 and 10/16/2015    - 10/05/2017  After extensive coaching inhaler device,  effectiveness =    50%    With use of med calendar doing much better with med reconciliation and remains well compensated s flares so no need to change rx

## 2017-10-05 NOTE — Addendum Note (Signed)
Addended by: Rosana Berger on: 10/05/2017 01:54 PM   Modules accepted: Orders

## 2017-10-05 NOTE — Assessment & Plan Note (Signed)
Evaluated in GI clinic/ [facility]/ Carlean Purl for Katie Bryant    - EGD  05/06/10 Pos Es Spasm/ HH with stricture   Advised change zanatc to pepcid 20 mg hs ok since issue re contamination of zantac supply

## 2017-10-05 NOTE — Progress Notes (Signed)
Subjective:     Patient ID: Katie Bryant, female   DOB: 01/28/1928    MRN: [id number]   Brief patient profile:  35 yowf quit smoking 1992 with GOLD III copd dx 2009 eval in pulm clinic p hospitalized at [facility] 12/21/10   History of Present Illness  09/27/2013 Follow up and Med review  Hx of COPD III/ 02 dep/ has med  Patient returns for followup and medication review. We reviewed all her medications and organized them into a medication calendar with patient education. Does get easily confused with meds. Appears family is helping with meds now.  Recently dx with dementia, now on Namenda. Seems to be some improvement with memory.  rec No change rx/ follow med calendar and bring to each visit/ daughter [name]      11/04/2016  f/u ov/Katie Bryant re:  Copd GOLD III/ ? Increased 02 req ? (not monitoring sats as req) Chief Complaint  Patient presents with  . Follow-up    Pt has leg swelling more in rt leg/foot then left. Pt has sob, wheezing with dry cough. Pt's daughter states pt is not doing well, now living with the daughter  breathing worse since late August Admit to [facility] 09/12/16 for ams "it turned out to be sob  Not adjusting 02 as rec for sats but rather "for how she feels"  Sleeping fine on side but increaesed 02 to 5lpm hs by daughter  Has med calendar but does not appear to be using Doe = MMRC3 = can't walk 100 yards even at a slow pace at a flat grade s stopping due to sob  Even on 5lpm  rec No change rx x mucinex should be mucinex dm 2 every      03/19/2017  Acute  ov/Katie Bryant re:  GOLD III spirometry  but 02 dep chronically  Chief Complaint  Patient presents with  . Acute Visit    She is c/o increased congestion since she had the Flu in late Feb 2019. She has had some left side pain in her rib area for the past few wks- painful when she coughs. She is using her albuterol inhaler 4 x daily on average. She has lost 10 lbs since last visit in Nov 2019 "but I eat like a horse".   Dyspnea:  Walking with rollator on up to 5lpm  Cough: much worse since flu / fell and now L cp with cough Sleep: difficult due to cough  SABA use: up to 4 x daily  rec Augmentin 875 mg take one pill twice daily  X 10 days -  For cough > mucinex dm up to 1200 every 12 hours and flutter valve as much as you can then add tylenol #3 one- half to one every 4 hours if needed  Prednisone 10 mg Take 4 for two days three for two days two for two days one for two days    10/05/2017  f/u ov/Katie Bryant re:  Copd gold III/ 02 dep  Chief Complaint  Patient presents with  . Follow-up    Breathing is overall doing well. She is having some PND- clear sputum.  She is using her albuterol inhaler 2 x per wk on average.   Dyspnea:  Up to church walking up to 5lpm/ no grocery shopping= MMRC3 = can't walk 100 yards even at a slow pace at a flat grade s stopping due to sob   Cough: min throat clearing daytime only  Sleeping: on flat bed 2 pillows on  L side down SABA use: when misses neb cause out of hours 02: 3lpm bump to 5lpm with activity     No obvious day to day or daytime variability or assoc excess/ purulent sputum or mucus plugs or hemoptysis or cp or chest tightness, subjective wheeze or overt sinus or hb symptoms.   Sleeps as above  without nocturnal  or early am exacerbation  of respiratory  c/o's or need for noct saba. Also denies any obvious fluctuation of symptoms with weather or environmental changes or other aggravating or alleviating factors except as outlined above   No unusual exposure hx or h/o childhood pna/ asthma or knowledge of premature birth.  Current Allergies, Complete Past Medical History, Past Surgical History, Family History, and Social History were reviewed in [employer] record.  ROS  The following are not active complaints unless bolded Hoarseness, sore throat, dysphagia, dental problems, itching, sneezing,  nasal congestion or discharge of excess mucus or  purulent secretions, ear ache,   fever, chills, sweats, unintended wt loss or wt gain, classically pleuritic or exertional cp,  orthopnea pnd or arm/hand swelling  or leg swelling, presyncope, palpitations, abdominal pain, anorexia, nausea, vomiting, diarrhea  or change in bowel habits or change in bladder habits, change in stools or change in urine, dysuria, hematuria,  rash, arthralgias, visual complaints, headache, numbness, weakness or ataxia or problems with walking or coordination,  change in mood or  memory.        Current Meds  Medication Sig  . acetaminophen (TYLENOL) 500 MG tablet Take 500 mg by mouth every 6 (six) hours as needed (pain).   Marland Kitchen ALPRAZolam (XANAX) 1 MG tablet Take 1 tablet (1 mg total) by mouth at bedtime.  . ARIPiprazole (ABILIFY) 5 MG tablet TAKE 1 TABLET DAILY  . aspirin 81 MG tablet Take 81 mg by mouth daily.  Marland Kitchen atorvastatin (LIPITOR) 40 MG tablet Take 40 mg by mouth daily.  . brimonidine (ALPHAGAN) 0.2 % ophthalmic solution Place 1 drop into both eyes daily.  . budesonide (PULMICORT) 0.5 MG/2ML nebulizer solution NEBULIZE 1 VIAL TWICE A DAY  . buPROPion (WELLBUTRIN SR) 200 MG 12 hr tablet TAKE (1) TABLET TWICE A DAY.  . cetirizine (ZYRTEC) 10 MG tablet Take 10 mg by mouth daily as needed for allergies.  . citalopram (CELEXA) 20 MG tablet TAKE 1 TABLET DAILY  . dextromethorphan (DELSYM) 30 MG/5ML liquid Take 30 mg 2 (two) times daily as needed by mouth for cough.  . dextromethorphan-guaiFENesin (MUCINEX DM) 30-600 MG 12hr tablet Take 1 tablet 2 (two) times daily as needed by mouth for cough.  . fenofibrate micronized (LOFIBRA) 134 MG capsule Take 1 capsule (134 mg total) by mouth daily before breakfast.  . ipratropium-albuterol (DUONEB) 0.5-2.5 (3) MG/3ML SOLN NEBULIZE 1 VIAL 4 TIMES A DAY  . ketorolac (ACULAR) 0.5 % ophthalmic solution Place 1 drop into both eyes 4 (four) times daily.  Marland Kitchen levothyroxine (SYNTHROID, LEVOTHROID) 50 MCG tablet TAKE 1 TABLET DAILY  .  LORazepam (ATIVAN) 0.5 MG tablet Take 0.5 mg 2 (two) times daily as needed by mouth for anxiety.  . meclizine (ANTIVERT) 25 MG tablet Take 25 mg every 8 (eight) hours as needed by mouth for dizziness.  . memantine (NAMENDA XR) 28 MG CP24 24 hr capsule TAKE (1) CAPSULE DAILY  . metoprolol tartrate (LOPRESSOR) 25 MG tablet TAKE (1/2) TABLET TWICE DAILY.  . montelukast (SINGULAIR) 10 MG tablet TAKE ONE TABLET AT BEDTIME  . OXYGEN Oxygen 3lpm 24/7, Increase  to 4L as needed for shortness of breath with activity  . polyethylene glycol (MIRALAX / GLYCOLAX) packet Take 8.5 g by mouth daily.  Marland Kitchen PROAIR HFA 108 (90 Base) MCG/ACT inhaler INHALE 2 PUFFS BY MOUTH EVERY 6 HOURS AS NEEDED  . ranitidine (ZANTAC) 150 MG tablet TAKE ONE TABLET AT BEDTIME  . Respiratory Therapy Supplies (FLUTTER) DEVI As needed  . RHOPRESSA 0.02 % SOLN Place 1 drop into both eyes at bedtime.  . sodium chloride (OCEAN) 0.65 % SOLN nasal spray 2 puffs every 4-6 hours as needed for nasal congestion  . valsartan-hydrochlorothiazide (DIOVAN-HCT) 80-12.5 MG tablet TAKE 1 TABLET DAILY  . Vitamin D, Ergocalciferol, (DRISDOL) 50000 units CAPS capsule Take 1 capsule (50,000 Units total) by mouth every 7 (seven) days.                Objective:  Physical Exam   amb wm all smiles today on rollator   Vital signs reviewed - Note on arrival 02 sats  96% on   2lpm pulsed 02    Wt 184 01/21/2011  >08/27/2011 186 > 180 09/29/2011 > 10/07/2011  183 > 11/11/2011  181 > 01/13/2012  183 >180 02/12/2012 > 06/09/2012 188 > 189 10/12/2012 >188 01/14/2013 > 01/21/2013 187 > 03/02/2013 >184 03/18/2013 >  03/28/2013 186> 06/06/2013  182 >  07/18/2013  179 >181 09/27/2013 > 12/08/2013   183 > 02/24/2014  178 > 06/09/2014 177 > 09/07/2014 175 > 12/05/2014   173 > 03/06/2015 173>  10/16/2015   171 >  04/28/2016  167 > 11/04/2016  155 >  03/19/2017  146 >  10/05/2017   149     HEENT: nl dentition / oropharynx. Nl external ear canals without cough reflex -  Mild bilateral  non-specific turbinate edema     NECK :  without JVD/Nodes/TM/ nl carotid upstrokes bilaterally   LUNGS: no acc muscle use,  Mod barrel  contour chest wall with bilateral  Distant bs s audible wheeze and  without cough on insp or exp maneuver and mod  Hyperresonant  to  percussion bilaterally     CV:  RRR  no s3 or murmur or increase in P2, and no edema   ABD:  soft and nontender with pos mid  insp Hoover's  in the supine position. No bruits or organomegaly appreciated, bowel sounds nl  MS:   Nl gait/  ext warm without deformities, calf tenderness, cyanosis or clubbing No obvious joint restrictions   SKIN: warm and dry without lesions    NEURO:  alert, approp, nl sensorium with  no motor or cerebellar deficits apparent.                CXR PA and Lateral:   03/19/2017 :    I personally reviewed images and agree with radiology impression as follows:   Bibasilar fibrosis. Scattered areas of scarring and generalized interstitial prominence. No frank edema or consolidation. Suspect a degree of pulmonary arterial hypertension.           Chemistry

## 2017-10-05 NOTE — Patient Instructions (Signed)
See calendar for specific medication instructions and bring it back for each and every office visit for every healthcare provider you see.  Without it,  you may not receive the best quality medical care that we feel you deserve.  You will note that the calendar groups together  your maintenance  medications that are timed at particular times of the day.  Think of this as your checklist for what your doctor has instructed you to do until your next evaluation to see what benefit  there is  to staying on a consistent group of medications intended to keep you well.  The other group at the bottom is entirely up to you to use as you see fit  for specific symptoms that may arise between visits that require you to treat them on an as needed basis.  Think of this as your action plan or "what if" list.   Separating the top medications from the bottom group is fundamental to providing you adequate care going forward.     Please schedule a follow up visit in 3 months but call sooner if needed - see Tammy for new calendar with your meds as you did today and from then on just bring the med calendar

## 2017-10-13 DIAGNOSIS — J449 Chronic obstructive pulmonary disease, unspecified: Secondary | ICD-10-CM | POA: Diagnosis not present

## 2017-10-20 ENCOUNTER — Other Ambulatory Visit: Payer: Self-pay | Admitting: Family Medicine

## 2017-10-22 ENCOUNTER — Other Ambulatory Visit: Payer: Self-pay | Admitting: Internal Medicine

## 2017-10-27 ENCOUNTER — Encounter (INDEPENDENT_AMBULATORY_CARE_PROVIDER_SITE_OTHER): Payer: Medicare Other | Admitting: Ophthalmology

## 2017-10-27 DIAGNOSIS — H43813 Vitreous degeneration, bilateral: Secondary | ICD-10-CM

## 2017-10-27 DIAGNOSIS — H353132 Nonexudative age-related macular degeneration, bilateral, intermediate dry stage: Secondary | ICD-10-CM | POA: Diagnosis not present

## 2017-10-27 DIAGNOSIS — H59033 Cystoid macular edema following cataract surgery, bilateral: Secondary | ICD-10-CM | POA: Diagnosis not present

## 2017-10-27 DIAGNOSIS — I10 Essential (primary) hypertension: Secondary | ICD-10-CM

## 2017-10-27 DIAGNOSIS — H35033 Hypertensive retinopathy, bilateral: Secondary | ICD-10-CM

## 2017-11-10 ENCOUNTER — Other Ambulatory Visit: Payer: Self-pay | Admitting: Family Medicine

## 2017-11-10 ENCOUNTER — Other Ambulatory Visit: Payer: Self-pay | Admitting: Family

## 2017-11-13 DIAGNOSIS — J449 Chronic obstructive pulmonary disease, unspecified: Secondary | ICD-10-CM | POA: Diagnosis not present

## 2017-11-18 ENCOUNTER — Other Ambulatory Visit: Payer: Self-pay | Admitting: Family Medicine

## 2017-11-24 ENCOUNTER — Ambulatory Visit (INDEPENDENT_AMBULATORY_CARE_PROVIDER_SITE_OTHER): Payer: Medicare Other | Admitting: Pediatrics

## 2017-11-24 ENCOUNTER — Encounter: Payer: Self-pay | Admitting: Pediatrics

## 2017-11-24 VITALS — BP 132/80 | HR 72 | Temp 97.1°F | Ht 64.0 in | Wt 153.6 lb

## 2017-11-24 DIAGNOSIS — N183 Chronic kidney disease, stage 3 unspecified: Secondary | ICD-10-CM

## 2017-11-24 DIAGNOSIS — R399 Unspecified symptoms and signs involving the genitourinary system: Secondary | ICD-10-CM

## 2017-11-24 DIAGNOSIS — N3090 Cystitis, unspecified without hematuria: Secondary | ICD-10-CM

## 2017-11-24 LAB — MICROSCOPIC EXAMINATION
RBC, UA: NONE SEEN /hpf (ref 0–2)
Renal Epithel, UA: NONE SEEN /hpf

## 2017-11-24 LAB — URINALYSIS, COMPLETE
BILIRUBIN UA: NEGATIVE
KETONES UA: NEGATIVE
Leukocytes, UA: NEGATIVE
Nitrite, UA: POSITIVE — AB
RBC, UA: NEGATIVE
SPEC GRAV UA: 1.015 (ref 1.005–1.030)
UUROB: 1 mg/dL (ref 0.2–1.0)
pH, UA: 6 (ref 5.0–7.5)

## 2017-11-24 MED ORDER — NITROFURANTOIN MONOHYD MACRO 100 MG PO CAPS: 100.0000 mg | ORAL_CAPSULE | Freq: Two times a day (BID) | ORAL | 0 refills | Status: AC

## 2017-11-24 NOTE — Progress Notes (Signed)
  Subjective:   Patient ID: Katie Bryant, female    DOB: March 11, 1928, 84 y.o.   MRN: [id number] CC: Burning with urination  HPI: Katie Bryant is a 84 y.o. female   Dysuria started 2 days ago.  Has had some urinary frequency, urgency as well.  Feels like prior urinary tract infections.  Appetite has been okay.  No fevers.  CKD: Avoiding NSAIDs.  Relevant past medical, surgical, family and social history reviewed. Allergies and medications reviewed and updated. Social History   Tobacco Use  Smoking Status Former Smoker  . Packs/day: 1.00  . Years: 40.00  . Pack years: 40.00  . Types: Cigarettes  . Last attempt to quit: 02/11/1990  . Years since quitting: 27.8  Smokeless Tobacco Never Used   ROS: Per HPI   Objective:    BP 132/80   Pulse 72   Temp (!) 97.1 F (36.2 C) (Oral)   Ht 5\' 4"  (1.626 m)   Wt 153 lb 9.6 oz (69.7 kg)   BMI 26.37 kg/m   Wt Readings from Last 3 Encounters:  11/24/17 153 lb 9.6 oz (69.7 kg)  10/05/17 149 lb (67.6 kg)  09/17/17 148 lb 3.2 oz (67.2 kg)    Gen: NAD, alert, cooperative with exam, NCAT, oxygen in place. EYES: EOMI, no conjunctival injection, or no icterus CV: NRRR, normal S1/S2, no murmur, distal pulses 2+ b/l Resp: CTABL, no wheezes, normal WOB Abd: +BS, soft, NTND. no guarding or organomegaly, no CVA tenderness Ext: No edema, warm Neuro: Alert and oriented, walking with walker.  Assessment & Plan:  Desirre was seen today for burning with urination.  Diagnoses and all orders for this visit:  Cystitis Positive UA, also with symptoms.  Start below.  Return precautions discussed.  We will follow-up urine culture. -     nitrofurantoin, macrocrystal-monohydrate, (MACROBID) 100 MG capsule; Take 1 capsule (100 mg total) by mouth 2 (two) times daily for 5 days.  UTI symptoms -     Urinalysis, Complete -     Urine Culture; Future -     Urine Culture  Stage 3 chronic kidney disease (HCC) Avoid NSAIDs.  Follow up plan: Return if  symptoms worsen or fail to improve. Assunta Found, MD [place]

## 2017-11-25 ENCOUNTER — Other Ambulatory Visit: Payer: Self-pay | Admitting: Family Medicine

## 2017-11-26 LAB — URINE CULTURE

## 2017-11-27 ENCOUNTER — Telehealth: Payer: Self-pay | Admitting: Family Medicine

## 2017-11-27 NOTE — Telephone Encounter (Signed)
Urine culture did not grow anything so far

## 2017-11-27 NOTE — Telephone Encounter (Signed)
Daughter aware.

## 2017-12-08 ENCOUNTER — Other Ambulatory Visit: Payer: Self-pay | Admitting: Cardiology

## 2017-12-09 ENCOUNTER — Encounter (INDEPENDENT_AMBULATORY_CARE_PROVIDER_SITE_OTHER): Payer: Medicare Other | Admitting: Ophthalmology

## 2017-12-09 DIAGNOSIS — I10 Essential (primary) hypertension: Secondary | ICD-10-CM | POA: Diagnosis not present

## 2017-12-09 DIAGNOSIS — H43813 Vitreous degeneration, bilateral: Secondary | ICD-10-CM

## 2017-12-09 DIAGNOSIS — H59031 Cystoid macular edema following cataract surgery, right eye: Secondary | ICD-10-CM | POA: Diagnosis not present

## 2017-12-09 DIAGNOSIS — H35033 Hypertensive retinopathy, bilateral: Secondary | ICD-10-CM | POA: Diagnosis not present

## 2017-12-09 DIAGNOSIS — H353132 Nonexudative age-related macular degeneration, bilateral, intermediate dry stage: Secondary | ICD-10-CM

## 2017-12-13 DIAGNOSIS — J449 Chronic obstructive pulmonary disease, unspecified: Secondary | ICD-10-CM | POA: Diagnosis not present

## 2017-12-16 ENCOUNTER — Other Ambulatory Visit: Payer: Self-pay | Admitting: Internal Medicine

## 2017-12-16 ENCOUNTER — Other Ambulatory Visit: Payer: Self-pay | Admitting: *Deleted

## 2018-01-01 ENCOUNTER — Ambulatory Visit (INDEPENDENT_AMBULATORY_CARE_PROVIDER_SITE_OTHER): Payer: Medicare Other | Admitting: Pediatrics

## 2018-01-01 ENCOUNTER — Encounter: Payer: Self-pay | Admitting: Pediatrics

## 2018-01-01 ENCOUNTER — Ambulatory Visit (INDEPENDENT_AMBULATORY_CARE_PROVIDER_SITE_OTHER): Payer: Medicare Other

## 2018-01-01 VITALS — BP 138/76 | HR 77 | Temp 98.1°F | Resp 22 | Ht 64.0 in

## 2018-01-01 DIAGNOSIS — R609 Edema, unspecified: Secondary | ICD-10-CM

## 2018-01-01 DIAGNOSIS — J441 Chronic obstructive pulmonary disease with (acute) exacerbation: Secondary | ICD-10-CM | POA: Diagnosis not present

## 2018-01-01 DIAGNOSIS — R0602 Shortness of breath: Secondary | ICD-10-CM

## 2018-01-01 DIAGNOSIS — F419 Anxiety disorder, unspecified: Secondary | ICD-10-CM

## 2018-01-01 MED ORDER — IPRATROPIUM-ALBUTEROL 0.5-2.5 (3) MG/3ML IN SOLN: RESPIRATORY_TRACT | 1 refills | Status: DC

## 2018-01-01 MED ORDER — LORAZEPAM 0.5 MG PO TABS: 0.5000 mg | ORAL_TABLET | Freq: Two times a day (BID) | ORAL | 0 refills | Status: DC | PRN

## 2018-01-01 MED ORDER — FUROSEMIDE 20 MG PO TABS: 20.0000 mg | ORAL_TABLET | Freq: Every day | ORAL | 3 refills | Status: DC | PRN

## 2018-01-01 MED ORDER — FUROSEMIDE 20 MG PO TABS: 20.0000 mg | ORAL_TABLET | Freq: Every day | ORAL | 3 refills | Status: DC

## 2018-01-01 MED ORDER — PREDNISONE 20 MG PO TABS: ORAL_TABLET | ORAL | 0 refills | Status: DC

## 2018-01-01 MED ORDER — LEVOFLOXACIN 500 MG PO TABS: 500.0000 mg | ORAL_TABLET | Freq: Every day | ORAL | 0 refills | Status: DC

## 2018-01-01 NOTE — Progress Notes (Signed)
Subjective:   Patient ID: Katie Bryant, female    DOB: 12-Jan-1928, 84 y.o.   MRN: [id number] CC: Cough and Shortness of Breath  HPI: Katie Bryant is a 84 y.o. female   Started coughing about 5 days ago.  Having worsening shortness of breath.  Usually is able to walk, has been limited with her walking because of shortness of breath.  Wears oxygen at 3 L all the time.  With exertion sometimes has to increase it at baseline.  Albuterol has not been as helpful as usual in the last few days.  Coughing more, sputum color green today in clinic which it is not usually.  COPD: Follows usually with pulmonology. Using budesonide twice a day.  Duo nebs 4 times a day.  Has not been missing any doses per daughter who is with her.  Patient lives with family now.  Daughter is also noticed slight increase in swelling in her feet bilaterally present in the last 2 to 3 days.  She is usually fairly inactive, sits most of the day.  That has not changed and she is gotten sick.  Per daughter they have been trying to wean her off of Xanax.  She was prescribed Ativan when she was on hospice about a year ago.  Daughter tries to give it to her rarely.  When she gets very anxious with not feeling like she is able to breathe, the Ativan does help.  She is had to use it once a day for last 2 days.  She does usually take Xanax once a day in the evening.  Prescribed by her PCP.  Relevant past medical, surgical, family and social history reviewed. Allergies and medications reviewed and updated. Social History   Tobacco Use  Smoking Status Former Smoker  . Packs/day: 1.00  . Years: 40.00  . Pack years: 40.00  . Types: Cigarettes  . Last attempt to quit: 02/11/1990  . Years since quitting: 27.9  Smokeless Tobacco Never Used   ROS: Per HPI   Objective:    BP 138/76   Pulse 77   Temp 98.1 F (36.7 C) (Oral)   Resp (!) 22   Ht 5\' 4"  (1.626 m)   SpO2 94% Comment: 3L O2  BMI 26.37 kg/m   Wt Readings from Last  3 Encounters:  11/24/17 153 lb 9.6 oz (69.7 kg)  10/05/17 149 lb (67.6 kg)  09/17/17 148 lb 3.2 oz (67.2 kg)    Gen: NAD, alert, cooperative with exam, NCAT EYES: EOMI, no conjunctival injection, or no icterus ENT:  TMs pearly gray b/l, OP without erythema LYMPH: no cervical LAD CV: NRRR, normal S1/S2, no murmur, distal pulses 2+ b/l Resp: Initially CTABL with prolonged expiratory phase, increased work of breathing.  Gave DuoNeb in office, significant wheezing present bilaterally following breathing treatment.  Breathing slightly more comfortable after breathing treatment, still slightly tachypneic.  No accessory muscle use. Ext: Trace pitting edema bilateral feet, warm Neuro: Alert and oriented, strength equal b/l UE and LE, coordination grossly normal MSK: normal muscle bulk  Assessment & Plan:  Gage was seen today for cough and shortness of breath.  Diagnoses and all orders for this visit:  COPD exacerbation ([place]) Gave DuoNeb in office, wheezing present bilaterally following breathing treatment.  Breathing more comfortable after breathing treatment, still slightly tachypneic. Continue duo nebs 4 times a day, budesonide, treat with steroids and Levaquin as below.  Return precautions discussed. -     predniSONE (DELTASONE) 20 MG tablet;  2 po at same time daily for 5 days -     levofloxacin (LEVAQUIN) 500 MG tablet; Take 1 tablet (500 mg total) by mouth daily.  Anxiety Use below only as needed.  Do not take before bed.  Use fan in the face to help with shortness of breath first before using below.  Follow-up with PCP. -     LORazepam (ATIVAN) 0.5 MG tablet; Take 1 tablet (0.5 mg total) by mouth 2 (two) times daily as needed for anxiety.  SOB (shortness of breath) -     DG Chest 2 View; Future  Fluid retention Trace pitting edema in feet, usually inactive.  Try to keep feet propped up, okay to take furosemide for 3 days, follow-up with PCP within the next couple weeks. -      furosemide (LASIX) 20 MG tablet; Take 1 tablet (20 mg total) by mouth daily as needed for edema.  Other orders -     ipratropium-albuterol (DUONEB) 0.5-2.5 (3) MG/3ML SOLN; NEBULIZE 1 VIAL 4 TIMES A DAY   Follow up plan: Return in about 2 weeks (around 01/15/2018) for COPD f/u. Assunta Found, MD [place]

## 2018-01-04 ENCOUNTER — Encounter: Payer: Medicare Other | Admitting: Adult Health

## 2018-01-11 ENCOUNTER — Ambulatory Visit: Payer: Medicare Other | Admitting: Adult Health

## 2018-01-11 ENCOUNTER — Encounter: Payer: Self-pay | Admitting: Adult Health

## 2018-01-11 DIAGNOSIS — J449 Chronic obstructive pulmonary disease, unspecified: Secondary | ICD-10-CM | POA: Diagnosis not present

## 2018-01-11 DIAGNOSIS — J9612 Chronic respiratory failure with hypercapnia: Secondary | ICD-10-CM

## 2018-01-11 NOTE — Progress Notes (Signed)
@Patient  ID: Katie Bryant, female    DOB: September 18, 1928, 85 y.o.   MRN: [id number]  Chief Complaint  Patient presents with  . Follow-up    COPD     Referring provider: Dettinger, Fransisca Kaufmann, MD  HPI: 51 yoformer smoker followed for gold 3 COPDand oxygen dependent respiratory failure Patient has dementia daughter helps her with her care,   TEST PFT's 06/11/2007 FEV1 0.70 (34%) ratio 32 with 49% improvement p B2   01/11/2018 Follow up : COPD , O2 RF , Med Review Patient returns for a 3 month follow up for severe COPD that is oxygen dependent.  Says overall that breathing is doing okay.  She is accompanied with her daughter today.  Had a COPD flare recently , she was seen by PCP given Levaquin and Prednisont taper. . She is feeling better. Cough and congestion are decreased.  She remains on Pulmicort nebulizer twice daily.  And DuoNeb nebulizer 4 times daily.  We reviewed all her medications. She gets pill packs through [facility] .  Her pharmacy keeps an updated list .  It is correct with our Epic MAR .   She remains on  oxygen 4l/m .  No change in oxygen demands.  Says it during her recent COPD flare she did notice some lower oxygen levels but that has resolved    No Known Allergies  Immunization History  Administered Date(s) Administered  . Influenza Split 10/07/2010, 09/19/2012, 09/28/2012  . Influenza Whole 10/07/2011  . Influenza, High Dose Seasonal PF 09/23/2013, 10/19/2014, 10/03/2015, 10/15/2016, 09/30/2017  . Influenza,inj,quad, With Preservative 10/06/2016  . Pneumococcal Conjugate-13 10/19/2014  . Pneumococcal Polysaccharide-23 01/06/2009  . Td 03/06/2016    Past Medical History:  Diagnosis Date  . Alzheimer's disease ([place]) 03/29/2015  . Anxiety   . Aortic stenosis, mild   . Arthritis   . Asthma   . Back pain   . Breast cancer ([place])   . Carotid artery disease ([place]) 01/01/2017  . COPD (chronic obstructive pulmonary disease) ([place])   . Dementia     . Depression   . GERD (gastroesophageal reflux disease)   . H/O hiatal hernia   . Hypertension   . Hypothyroidism 06/25/2016  . Lymphadenopathy 10/07/2012  . Obesity   . Oxygen dependent   . Pneumonia   . Right hamstring muscle strain 04/26/2013  . Sleep apnea     Tobacco History: Social History   Tobacco Use  Smoking Status Former Smoker  . Packs/day: 1.00  . Years: 40.00  . Pack years: 40.00  . Types: Cigarettes  . Last attempt to quit: 02/11/1990  . Years since quitting: 27.9  Smokeless Tobacco Never Used   Counseling given: Not Answered   Outpatient Medications Prior to Visit  Medication Sig Dispense Refill  . acetaminophen (TYLENOL) 500 MG tablet Take 500 mg by mouth every 6 (six) hours as needed (pain).     Marland Kitchen ALPRAZolam (XANAX) 1 MG tablet Take 1 tablet (1 mg total) by mouth at bedtime. 30 tablet 5  . ARIPiprazole (ABILIFY) 5 MG tablet TAKE 1 TABLET DAILY 30 tablet 2  . aspirin 81 MG tablet Take 81 mg by mouth daily.    Marland Kitchen atorvastatin (LIPITOR) 40 MG tablet TAKE 1 TABLET DAILY 90 tablet 1  . betaxolol (BETOPTIC-S) 0.25 % ophthalmic suspension Place 1 drop into both eyes every morning.    . brimonidine (ALPHAGAN) 0.2 % ophthalmic solution Place 1 drop into both eyes daily.    . budesonide (PULMICORT)  0.5 MG/2ML nebulizer solution NEBULIZE 1 VIAL TWICE A DAY 120 mL 5  . buPROPion (WELLBUTRIN SR) 200 MG 12 hr tablet TAKE (1) TABLET TWICE A DAY. 60 tablet 2  . chlorpheniramine (CHLOR-TRIMETON) 4 MG tablet Take 4 mg by mouth every 4 (four) hours as needed (drippy nose, drainage).    . Cholecalciferol (VITAMIN D) 50 MCG (2000 UT) tablet Take 2,000 Units by mouth daily.    . citalopram (CELEXA) 20 MG tablet TAKE 1 TABLET DAILY 30 tablet 2  . dextromethorphan (DELSYM) 30 MG/5ML liquid Take 30 mg 2 (two) times daily as needed by mouth for cough.    . dextromethorphan-guaiFENesin (MUCINEX DM) 30-600 MG 12hr tablet Take 1 tablet 2 (two) times daily as needed by mouth for cough.     . famotidine (PEPCID) 20 MG tablet Take 1 tablet (20 mg total) by mouth at bedtime. 30 tablet 11  . fenofibrate micronized (LOFIBRA) 134 MG capsule Take 1 capsule (134 mg total) by mouth daily before breakfast. 90 capsule 1  . furosemide (LASIX) 20 MG tablet Take 1 tablet (20 mg total) by mouth daily as needed for edema. 30 tablet 3  . ipratropium-albuterol (DUONEB) 0.5-2.5 (3) MG/3ML SOLN NEBULIZE 1 VIAL 4 TIMES A DAY 360 mL 1  . ketorolac (ACULAR) 0.5 % ophthalmic solution Place 1 drop into both eyes 4 (four) times daily.    Marland Kitchen levothyroxine (SYNTHROID, LEVOTHROID) 50 MCG tablet TAKE 1 TABLET DAILY 30 tablet 10  . LORazepam (ATIVAN) 0.5 MG tablet Take 1 tablet (0.5 mg total) by mouth 2 (two) times daily as needed for anxiety. 30 tablet 0  . loteprednol (LOTEMAX) 0.5 % ophthalmic suspension Place 1 drop into both eyes 2 (two) times daily.    . meclizine (ANTIVERT) 25 MG tablet Take 25 mg every 8 (eight) hours as needed by mouth for dizziness.    . memantine (NAMENDA XR) 28 MG CP24 24 hr capsule TAKE (1) CAPSULE DAILY 30 capsule 4  . metoprolol tartrate (LOPRESSOR) 25 MG tablet TAKE (1/2) TABLET TWICE DAILY. 90 tablet 0  . montelukast (SINGULAIR) 10 MG tablet TAKE ONE TABLET AT BEDTIME 30 tablet 2  . OXYGEN Oxygen 3lpm 24/7, Increase to 4L as needed for shortness of breath with activity    . polyethylene glycol (MIRALAX / GLYCOLAX) packet Take 8.5 g by mouth daily.    Marland Kitchen PROAIR HFA 108 (90 Base) MCG/ACT inhaler INHALE 2 PUFFS BY MOUTH EVERY 6 HOURS AS NEEDED 8.5 g 5  . Respiratory Therapy Supplies (FLUTTER) DEVI As needed    . RHOPRESSA 0.02 % SOLN Place 1 drop into both eyes at bedtime.    . sodium chloride (OCEAN) 0.65 % SOLN nasal spray 2 puffs every 4-6 hours as needed for nasal congestion    . valsartan-hydrochlorothiazide (DIOVAN-HCT) 80-12.5 MG tablet TAKE 1 TABLET DAILY 90 tablet 1  . cetirizine (ZYRTEC) 10 MG tablet Take 10 mg by mouth daily as needed for allergies.    Marland Kitchen levofloxacin  (LEVAQUIN) 500 MG tablet Take 1 tablet (500 mg total) by mouth daily. (Patient not taking: Reported on 01/11/2018) 7 tablet 0  . predniSONE (DELTASONE) 20 MG tablet 2 po at same time daily for 5 days (Patient not taking: Reported on 01/11/2018) 10 tablet 0  . Vitamin D, Ergocalciferol, (DRISDOL) 50000 units CAPS capsule Take 1 capsule (50,000 Units total) by mouth every 7 (seven) days. (Patient not taking: Reported on 01/11/2018) 12 capsule 0   No facility-administered medications prior to visit.  Review of Systems  Constitutional:   No  weight loss, night sweats,  Fevers, chills, + fatigue, or  lassitude.  HEENT:   No headaches,  Difficulty swallowing,  Tooth/dental problems, or  Sore throat,                No sneezing, itching, ear ache, nasal congestion, post nasal drip,   CV:  No chest pain,  Orthopnea, PND, swelling in lower extremities, anasarca, dizziness, palpitations, syncope.   GI  No heartburn, indigestion, abdominal pain, nausea, vomiting, diarrhea, change in bowel habits, loss of appetite, bloody stools.   Resp:.  No excess mucus, no productive cough,  No non-productive cough,  No coughing up of blood.  No change in color of mucus.  No wheezing.  No chest wall deformity  Skin: no rash or lesions.  GU: no dysuria, change in color of urine, no urgency or frequency.  No flank pain, no hematuria   MS:  No joint pain or swelling.  No decreased range of motion.  No back pain.    Physical Exam  BP 118/62 (BP Location: Left Arm, Cuff Size: Normal)   Pulse 63   Ht 5\' 4"  (1.626 m)   Wt 146 lb 3.2 oz (66.3 kg)   SpO2 95%   BMI 25.10 kg/m   GEN: A/Ox3; pleasant , NAD, elderly on oxygen   HEENT:  [place]/AT,  EACs-clear, TMs-wnl, NOSE-clear, THROAT-clear, no lesions, no postnasal drip or exudate noted.   NECK:  Supple w/ fair ROM; no JVD; normal carotid impulses w/o bruits; no thyromegaly or nodules palpated; no lymphadenopathy.    RESP decreased breath sounds in the bases  no  accessory muscle use, no dullness to percussion  CARD:  RRR, no m/r/g, tr peripheral edema, pulses intact, no cyanosis or clubbing.  GI:   Soft & nt; nml bowel sounds; no organomegaly or masses detected.   Musco: Warm bil, no deformities or joint swelling noted.   Neuro: alert, no focal deficits noted.    Skin: Warm, no lesions or rashes    Lab Results:  CBC  No results found for: BNP  ProBNP  Imaging: Dg Chest 2 View  Result Date: 01/01/2018 CLINICAL DATA:  Shortness of breath, COPD EXAM: CHEST - 2 VIEW COMPARISON:  None. FINDINGS: There is hyperinflation of the lungs compatible with COPD. Cardiomegaly. Bilateral fibrosis and interstitial prominence, unchanged. No acute confluent airspace opacity or effusion. No acute bony abnormality. IMPRESSION: COPD/fibrosis changes.  Mild cardiomegaly.  No active disease. Electronically Signed   By: Rolm Baptise M.D.   On: 01/01/2018 11:12      No flowsheet data found.  No results found for: NITRICOXIDE      Assessment & Plan:   COPD GOLD III/ 02 dep  Recent exacerbation now resolved.  Patient appears to be nearing her baseline. I reviewed all her medications and she has a very accurate list and seems to be doing well with this.  Her pharmacy does a great job with her pill packs.  Her daughter is very helpful with her healthcare.  Plan  Patient Instructions  Continue on Pulmicort Neb Twice daily   Continue on [employer] Four times a day  .  Continue on Oxygen 4l/m .  May use Mucinex DM with flutter As needed  Cough/congestion  Activity as tolerated.  Follow up with Dr. [name]  In 3 months and As needed        Chronic respiratory failure with hypercapnia ([place]) Cont on  O2      Rexene Edison, NP 01/11/2018

## 2018-01-11 NOTE — Patient Instructions (Signed)
Continue on Pulmicort Neb Twice daily   Continue on [employer] Four times a day  .  Continue on Oxygen 4l/m .  May use Mucinex DM with flutter As needed  Cough/congestion  Activity as tolerated.  Follow up with Dr. [name]  In 3 months and As needed

## 2018-01-11 NOTE — Assessment & Plan Note (Signed)
Recent exacerbation now resolved.  Patient appears to be nearing her baseline. I reviewed all her medications and she has a very accurate list and seems to be doing well with this.  Her pharmacy does a great job with her pill packs.  Her daughter is very helpful with her healthcare.  Plan  Patient Instructions  Continue on Pulmicort Neb Twice daily   Continue on [employer] Four times a day  .  Continue on Oxygen 4l/m .  May use Mucinex DM with flutter As needed  Cough/congestion  Activity as tolerated.  Follow up with Dr. [name]  In 3 months and As needed

## 2018-01-11 NOTE — Assessment & Plan Note (Signed)
Cont on O2 .  

## 2018-01-13 ENCOUNTER — Other Ambulatory Visit: Payer: Self-pay | Admitting: Cardiovascular Disease

## 2018-01-13 DIAGNOSIS — J449 Chronic obstructive pulmonary disease, unspecified: Secondary | ICD-10-CM | POA: Diagnosis not present

## 2018-01-14 ENCOUNTER — Ambulatory Visit (INDEPENDENT_AMBULATORY_CARE_PROVIDER_SITE_OTHER): Payer: Medicare Other | Admitting: Family Medicine

## 2018-01-14 ENCOUNTER — Encounter: Payer: Self-pay | Admitting: Family Medicine

## 2018-01-14 VITALS — BP 136/71 | HR 67 | Temp 97.5°F | Ht 64.0 in | Wt 145.2 lb

## 2018-01-14 DIAGNOSIS — B370 Candidal stomatitis: Secondary | ICD-10-CM | POA: Diagnosis not present

## 2018-01-14 DIAGNOSIS — L570 Actinic keratosis: Secondary | ICD-10-CM | POA: Diagnosis not present

## 2018-01-14 DIAGNOSIS — J449 Chronic obstructive pulmonary disease, unspecified: Secondary | ICD-10-CM | POA: Diagnosis not present

## 2018-01-14 DIAGNOSIS — C44629 Squamous cell carcinoma of skin of left upper limb, including shoulder: Secondary | ICD-10-CM | POA: Diagnosis not present

## 2018-01-14 MED ORDER — NYSTATIN 100000 UNIT/ML MT SUSP: 5.0000 mL | Freq: Four times a day (QID) | OROMUCOSAL | 0 refills | Status: DC

## 2018-01-14 NOTE — Progress Notes (Signed)
BP 136/71   Pulse 67   Temp (!) 97.5 F (36.4 C) (Oral)   Ht 5\' 4"  (1.626 m)   Wt 145 lb 3.2 oz (65.9 kg)   SpO2 94%   BMI 24.92 kg/m    Subjective:    Patient ID: Katie Bryant, female    DOB: 1928/04/30, 85 y.o.   MRN: [id number]  HPI: Katie Bryant is a 85 y.o. female presenting on 01/14/2018 for COPD (2 week follow up. Patient states that her mouth "feels sore in the inside")   HPI COPD Patient is coming in for COPD recheck today.  He is currently on Pulmicort and Singulair and pro-air.  He has a mild chronic cough but denies any major coughing spells or wheezing spells.  He has 1nighttime symptoms per week and 1daytime symptoms per week currently.  Patient is currently on 2 L nasal cannula oxygen all the time.  Patient has developed some soreness in her mouth and her daughter does think that she is biting the side of her mouth but she also has some soreness through the roof of mouth and her tongue as well that has been going on over the past few days.  She is coming in also today for the 2-week follow-up for COPD and she did see a pulmonologist as well and she seems to be stable today and doing much better than when she was seen a couple weeks ago.  Relevant past medical, surgical, family and social history reviewed and updated as indicated. Interim medical history since our last visit reviewed. Allergies and medications reviewed and updated.  Review of Systems  Constitutional: Negative for chills and fever.  HENT: Positive for mouth sores. Negative for congestion, ear discharge, ear pain and sore throat.   Eyes: Negative for visual disturbance.  Respiratory: Negative for chest tightness, shortness of breath and wheezing.   Cardiovascular: Negative for chest pain and leg swelling.  Musculoskeletal: Negative for back pain and gait problem.  Skin: Negative for rash.  Neurological: Negative for light-headedness and headaches.  Psychiatric/Behavioral: Negative for agitation  and behavioral problems.  All other systems reviewed and are negative.   Per HPI unless specifically indicated above     Objective:    BP 136/71   Pulse 67   Temp (!) 97.5 F (36.4 C) (Oral)   Ht 5\' 4"  (1.626 m)   Wt 145 lb 3.2 oz (65.9 kg)   SpO2 94%   BMI 24.92 kg/m   Wt Readings from Last 3 Encounters:  01/14/18 145 lb 3.2 oz (65.9 kg)  01/11/18 146 lb 3.2 oz (66.3 kg)  11/24/17 153 lb 9.6 oz (69.7 kg)    Physical Exam Vitals signs and nursing note reviewed.  Constitutional:      General: She is not in acute distress.    Appearance: She is well-developed. She is not diaphoretic.  HENT:     Mouth/Throat:     Mouth: Mucous membranes are moist.     Comments: Patient has a small amount of thrush on the top of the tongue Eyes:     Conjunctiva/sclera: Conjunctivae normal.  Cardiovascular:     Rate and Rhythm: Normal rate and regular rhythm.     Heart sounds: Normal heart sounds. No murmur.  Pulmonary:     Effort: Pulmonary effort is normal. No respiratory distress.     Breath sounds: Normal breath sounds. No wheezing.  Musculoskeletal: Normal range of motion.        General: No  tenderness.  Skin:    General: Skin is warm and dry.     Findings: No rash.  Neurological:     Mental Status: She is alert and oriented to person, place, and time.     Coordination: Coordination normal.  Psychiatric:        Behavior: Behavior normal.         Assessment & Plan:   Problem List Items Addressed This Visit      Respiratory   COPD GOLD III/ 02 dep  - Primary    Other Visit Diagnoses    Thrush, oral       Relevant Medications   nystatin (MYCOSTATIN) 100000 UNIT/ML suspension      COPD has been doing good, continue current medication Follow up plan: Return in about 3 months (around 04/15/2018), or if symptoms worsen or fail to improve, for COPD recheck.  Counseling provided for all of the vaccine components No orders of the defined types were placed in this  encounter.   Caryl Pina, MD [facility] 01/20/2018, 8:50 PM

## 2018-01-14 NOTE — Progress Notes (Signed)
Chart and office note reviewed in detail  > agree with a/p as outlined    

## 2018-02-03 ENCOUNTER — Encounter (INDEPENDENT_AMBULATORY_CARE_PROVIDER_SITE_OTHER): Payer: Medicare Other | Admitting: Ophthalmology

## 2018-02-03 DIAGNOSIS — H43813 Vitreous degeneration, bilateral: Secondary | ICD-10-CM

## 2018-02-03 DIAGNOSIS — H59031 Cystoid macular edema following cataract surgery, right eye: Secondary | ICD-10-CM | POA: Diagnosis not present

## 2018-02-03 DIAGNOSIS — I10 Essential (primary) hypertension: Secondary | ICD-10-CM

## 2018-02-03 DIAGNOSIS — H35033 Hypertensive retinopathy, bilateral: Secondary | ICD-10-CM

## 2018-02-03 DIAGNOSIS — H353132 Nonexudative age-related macular degeneration, bilateral, intermediate dry stage: Secondary | ICD-10-CM

## 2018-02-09 ENCOUNTER — Other Ambulatory Visit: Payer: Self-pay | Admitting: Family Medicine

## 2018-02-13 DIAGNOSIS — J449 Chronic obstructive pulmonary disease, unspecified: Secondary | ICD-10-CM | POA: Diagnosis not present

## 2018-02-17 ENCOUNTER — Other Ambulatory Visit: Payer: Self-pay | Admitting: Family Medicine

## 2018-02-23 ENCOUNTER — Telehealth: Payer: Self-pay | Admitting: Family Medicine

## 2018-02-23 ENCOUNTER — Other Ambulatory Visit: Payer: Self-pay | Admitting: Family Medicine

## 2018-02-23 NOTE — Telephone Encounter (Signed)
appt scheduled Pt notified 

## 2018-02-24 ENCOUNTER — Ambulatory Visit (INDEPENDENT_AMBULATORY_CARE_PROVIDER_SITE_OTHER): Payer: Medicare Other | Admitting: Family Medicine

## 2018-02-24 ENCOUNTER — Encounter: Payer: Self-pay | Admitting: Family Medicine

## 2018-02-24 ENCOUNTER — Ambulatory Visit (INDEPENDENT_AMBULATORY_CARE_PROVIDER_SITE_OTHER): Payer: Medicare Other

## 2018-02-24 VITALS — BP 211/117 | HR 96 | Temp 97.6°F | Ht 64.0 in | Wt 146.0 lb

## 2018-02-24 DIAGNOSIS — W19XXXA Unspecified fall, initial encounter: Secondary | ICD-10-CM | POA: Diagnosis not present

## 2018-02-24 DIAGNOSIS — M545 Low back pain: Secondary | ICD-10-CM | POA: Diagnosis not present

## 2018-02-24 DIAGNOSIS — R300 Dysuria: Secondary | ICD-10-CM

## 2018-02-24 DIAGNOSIS — S3993XA Unspecified injury of pelvis, initial encounter: Secondary | ICD-10-CM | POA: Diagnosis not present

## 2018-02-24 NOTE — Progress Notes (Signed)
HPI: Pt is an 85 y/o female presenting with low back pain after a fall on Monday night. Pt states she was getting up to use her commode, and fell on her left side. She states the pain is worse with moving around, and relieved when laying down. Pt states pain is worse in the left lower back.  She denies headaches and extremity pain, numbness, or tingling.  Pt's daughter states she has been more confused since the fall, and has had some burning on urination and urinary frequency.  Patient describes the pain as a sharp achy sensation that is moderate in severity  ROS: GEN: Pt appears stated age, in no acute distress HEAD: (-) headaches, dizziness CARDIO: (-) chest pain PULM: (-) SOB GU: (+) urinary frequency, dysuria MSK: (+) lower back pain, worse on left side; (-) extremity pain and tingling  PMH: arthritis, frequent falls  PE: CARDIO: normal rate and rhythm, no extra heart sounds PULM: lungs clear to auscultation bilaterally MSK: mild tenderness to palpation in central, lower lumbar area, especially around coccyx. No tenderness to bilateral hip flexion, and internal and external rotation.   Tests: - Urinalysis: ordered - Urine culture: ordered - X-ray of low back: no fractures or it other acute bony abnormalities, chronic degenerative changes  Assessment and Plan:  Low back pain - No evidence of acute fractures - Treat with tylenol  Possible UTI - Pt given a urine sample cup, and will return it - Follow up with patient regarding results of urine tests  I was personally present for all components of the history, physical exam and/or medical decision making.  I agree with the documentation performed by the PA student and agree with assessment and plan above.  PA student was Johnney Killian, PA-S. Caryl Pina, MD [facility] 02/26/2018, 7:37 AM

## 2018-02-25 ENCOUNTER — Other Ambulatory Visit: Payer: Medicare Other

## 2018-02-25 DIAGNOSIS — R300 Dysuria: Secondary | ICD-10-CM | POA: Diagnosis not present

## 2018-02-25 LAB — URINALYSIS, COMPLETE
Bilirubin, UA: NEGATIVE
GLUCOSE, UA: NEGATIVE
Ketones, UA: NEGATIVE
NITRITE UA: NEGATIVE
Protein, UA: NEGATIVE
RBC, UA: NEGATIVE
Specific Gravity, UA: 1.015 (ref 1.005–1.030)
Urobilinogen, Ur: 1 mg/dL (ref 0.2–1.0)
pH, UA: 7 (ref 5.0–7.5)

## 2018-02-25 LAB — MICROSCOPIC EXAMINATION
RBC MICROSCOPIC, UA: NONE SEEN /HPF (ref 0–2)
Renal Epithel, UA: NONE SEEN /hpf

## 2018-02-26 LAB — URINE CULTURE

## 2018-03-04 DIAGNOSIS — Z961 Presence of intraocular lens: Secondary | ICD-10-CM | POA: Diagnosis not present

## 2018-03-04 DIAGNOSIS — H40053 Ocular hypertension, bilateral: Secondary | ICD-10-CM | POA: Diagnosis not present

## 2018-03-04 DIAGNOSIS — H353231 Exudative age-related macular degeneration, bilateral, with active choroidal neovascularization: Secondary | ICD-10-CM | POA: Diagnosis not present

## 2018-03-04 DIAGNOSIS — H04203 Unspecified epiphora, bilateral lacrimal glands: Secondary | ICD-10-CM | POA: Diagnosis not present

## 2018-03-08 ENCOUNTER — Other Ambulatory Visit: Payer: Self-pay | Admitting: Cardiology

## 2018-03-08 ENCOUNTER — Other Ambulatory Visit: Payer: Self-pay | Admitting: Pediatrics

## 2018-03-14 DIAGNOSIS — J449 Chronic obstructive pulmonary disease, unspecified: Secondary | ICD-10-CM | POA: Diagnosis not present

## 2018-03-16 ENCOUNTER — Other Ambulatory Visit: Payer: Self-pay | Admitting: Family Medicine

## 2018-03-16 DIAGNOSIS — F419 Anxiety disorder, unspecified: Secondary | ICD-10-CM

## 2018-03-16 DIAGNOSIS — F339 Major depressive disorder, recurrent, unspecified: Secondary | ICD-10-CM

## 2018-03-18 ENCOUNTER — Other Ambulatory Visit: Payer: Self-pay | Admitting: Family Medicine

## 2018-03-18 NOTE — Telephone Encounter (Signed)
Daughter aware and states that she is not going to bring patient in this office due to the flu and [phone]

## 2018-03-19 ENCOUNTER — Telehealth: Payer: Self-pay | Admitting: Family Medicine

## 2018-03-19 DIAGNOSIS — F339 Major depressive disorder, recurrent, unspecified: Secondary | ICD-10-CM

## 2018-03-19 DIAGNOSIS — F419 Anxiety disorder, unspecified: Secondary | ICD-10-CM

## 2018-03-19 MED ORDER — ALPRAZOLAM 1 MG PO TABS: 1.0000 mg | ORAL_TABLET | Freq: Every day | ORAL | 1 refills | Status: DC

## 2018-03-19 MED ORDER — MEMANTINE HCL ER 28 MG PO CP24: ORAL_CAPSULE | ORAL | 3 refills | Status: DC

## 2018-03-19 NOTE — Telephone Encounter (Signed)
DR D are you willing to do this and /or send her meds in for her??

## 2018-03-19 NOTE — Telephone Encounter (Signed)
Daughter aware and appreciative

## 2018-03-19 NOTE — Telephone Encounter (Signed)
PT daughter has called states that she was told that she would have to bring her mom in to see dr dettinger to get medicine. Daughter said that she will pull up to the back door and the doctor can come out there and evaulate her, daughter doesn't want her exposed to anything since she has COPD

## 2018-03-19 NOTE — Telephone Encounter (Signed)
I went ahead and sent it in for her, before when I did answer the question previously it was before all this is that is happening, yes I agree with her staying away and we are changing our policy on refilling everything, I refilled her Xanax for her.

## 2018-03-24 ENCOUNTER — Other Ambulatory Visit: Payer: Self-pay | Admitting: Family Medicine

## 2018-03-25 ENCOUNTER — Ambulatory Visit: Payer: Medicare Other

## 2018-04-06 ENCOUNTER — Encounter (INDEPENDENT_AMBULATORY_CARE_PROVIDER_SITE_OTHER): Payer: Medicare Other | Admitting: Ophthalmology

## 2018-04-06 ENCOUNTER — Other Ambulatory Visit: Payer: Self-pay | Admitting: Family Medicine

## 2018-04-07 ENCOUNTER — Other Ambulatory Visit: Payer: Self-pay | Admitting: Internal Medicine

## 2018-04-07 ENCOUNTER — Other Ambulatory Visit: Payer: Self-pay | Admitting: Family Medicine

## 2018-04-14 DIAGNOSIS — J449 Chronic obstructive pulmonary disease, unspecified: Secondary | ICD-10-CM | POA: Diagnosis not present

## 2018-04-19 ENCOUNTER — Ambulatory Visit: Payer: Medicare Other | Admitting: Internal Medicine

## 2018-04-22 ENCOUNTER — Encounter (INDEPENDENT_AMBULATORY_CARE_PROVIDER_SITE_OTHER): Payer: Medicare Other | Admitting: Ophthalmology

## 2018-05-05 ENCOUNTER — Ambulatory Visit: Payer: Medicare Other | Admitting: *Deleted

## 2018-05-06 ENCOUNTER — Other Ambulatory Visit: Payer: Self-pay | Admitting: Internal Medicine

## 2018-05-06 NOTE — Telephone Encounter (Signed)
RX refill submitted

## 2018-05-12 ENCOUNTER — Other Ambulatory Visit: Payer: Self-pay | Admitting: Family Medicine

## 2018-05-14 DIAGNOSIS — J449 Chronic obstructive pulmonary disease, unspecified: Secondary | ICD-10-CM | POA: Diagnosis not present

## 2018-05-26 ENCOUNTER — Encounter (INDEPENDENT_AMBULATORY_CARE_PROVIDER_SITE_OTHER): Payer: Medicare Other | Admitting: Ophthalmology

## 2018-05-27 ENCOUNTER — Other Ambulatory Visit: Payer: Self-pay | Admitting: Family Medicine

## 2018-05-28 ENCOUNTER — Other Ambulatory Visit: Payer: Self-pay | Admitting: Family Medicine

## 2018-06-09 ENCOUNTER — Telehealth: Payer: Self-pay | Admitting: Family Medicine

## 2018-06-11 ENCOUNTER — Other Ambulatory Visit: Payer: Self-pay | Admitting: Family Medicine

## 2018-06-11 DIAGNOSIS — F419 Anxiety disorder, unspecified: Secondary | ICD-10-CM

## 2018-06-11 DIAGNOSIS — F339 Major depressive disorder, recurrent, unspecified: Secondary | ICD-10-CM

## 2018-06-11 NOTE — Telephone Encounter (Signed)
Pt has appt with you 07/14/18, ok to refill or ntbs?

## 2018-06-14 DIAGNOSIS — J449 Chronic obstructive pulmonary disease, unspecified: Secondary | ICD-10-CM | POA: Diagnosis not present

## 2018-06-15 ENCOUNTER — Other Ambulatory Visit: Payer: Self-pay | Admitting: Family Medicine

## 2018-06-15 NOTE — Telephone Encounter (Signed)
Next OV 07/14/18

## 2018-06-22 ENCOUNTER — Other Ambulatory Visit: Payer: Self-pay | Admitting: Family Medicine

## 2018-06-23 ENCOUNTER — Other Ambulatory Visit: Payer: Self-pay | Admitting: Family Medicine

## 2018-06-28 ENCOUNTER — Other Ambulatory Visit: Payer: Self-pay | Admitting: Cardiology

## 2018-06-28 ENCOUNTER — Other Ambulatory Visit: Payer: Self-pay | Admitting: Family Medicine

## 2018-06-29 ENCOUNTER — Other Ambulatory Visit: Payer: Self-pay | Admitting: *Deleted

## 2018-06-29 MED ORDER — IPRATROPIUM-ALBUTEROL 0.5-2.5 (3) MG/3ML IN SOLN: 3.0000 mL | Freq: Four times a day (QID) | RESPIRATORY_TRACT | 2 refills | Status: DC | PRN

## 2018-07-02 ENCOUNTER — Encounter (INDEPENDENT_AMBULATORY_CARE_PROVIDER_SITE_OTHER): Payer: Medicare Other | Admitting: Ophthalmology

## 2018-07-06 ENCOUNTER — Ambulatory Visit (INDEPENDENT_AMBULATORY_CARE_PROVIDER_SITE_OTHER): Payer: Medicare Other | Admitting: Internal Medicine

## 2018-07-06 ENCOUNTER — Encounter: Payer: Self-pay | Admitting: Internal Medicine

## 2018-07-06 ENCOUNTER — Other Ambulatory Visit: Payer: Self-pay

## 2018-07-06 ENCOUNTER — Other Ambulatory Visit: Payer: Self-pay | Admitting: Family Medicine

## 2018-07-06 DIAGNOSIS — K219 Gastro-esophageal reflux disease without esophagitis: Secondary | ICD-10-CM | POA: Diagnosis not present

## 2018-07-06 DIAGNOSIS — F339 Major depressive disorder, recurrent, unspecified: Secondary | ICD-10-CM

## 2018-07-06 DIAGNOSIS — J9612 Chronic respiratory failure with hypercapnia: Secondary | ICD-10-CM

## 2018-07-06 DIAGNOSIS — F419 Anxiety disorder, unspecified: Secondary | ICD-10-CM

## 2018-07-06 DIAGNOSIS — J449 Chronic obstructive pulmonary disease, unspecified: Secondary | ICD-10-CM | POA: Diagnosis not present

## 2018-07-06 MED ORDER — OMEPRAZOLE 20 MG PO CPDR: DELAYED_RELEASE_CAPSULE | ORAL | 11 refills | Status: DC

## 2018-07-06 NOTE — Progress Notes (Signed)
Subjective:     Patient ID: Katie Bryant, female   DOB: 03-10-1928    MRN: [id number]   Brief patient profile:  15 yowf quit smoking 1992 with GOLD III copd dx 2009 eval in pulm clinic p hospitalized at [facility] 12/21/10   History of Present Illness  09/27/2013 Follow up and Med review  Hx of COPD III/ 02 dep/ has med  Patient returns for followup and medication review. We reviewed all her medications and organized them into a medication calendar with patient education. Does get easily confused with meds. Appears family is helping with meds now.  Recently dx with dementia, now on Namenda. Seems to be some improvement with memory.  rec No change rx/ follow med calendar and bring to each visit/ daughter [name]      11/04/2016  f/u ov/Wert re:  Copd GOLD III/ ? Increased 02 req ? (not monitoring sats as req) Chief Complaint  Patient presents with  . Follow-up    Pt has leg swelling more in rt leg/foot then left. Pt has sob, wheezing with dry cough. Pt's daughter states pt is not doing well, now living with the daughter  breathing worse since late August Admit to [facility] 09/12/16 for ams "it turned out to be sob  Not adjusting 02 as rec for sats but rather "for how she feels"  Sleeping fine on side but increaesed 02 to 5lpm hs by daughter  Has med calendar but does not appear to be using Doe = MMRC3 = can't walk 100 yards even at a slow pace at a flat grade s stopping due to sob  Even on 5lpm  rec No change rx x mucinex should be mucinex dm 2 every      03/19/2017  Acute  ov/Wert re:  GOLD III spirometry  but 02 dep chronically  Chief Complaint  Patient presents with  . Acute Visit    She is c/o increased congestion since she had the Flu in late Feb 2019. She has had some left side pain in her rib area for the past few wks- painful when she coughs. She is using her albuterol inhaler 4 x daily on average. She has lost 10 lbs since last visit in Nov 2019 "but I eat like a horse".   Dyspnea:  Walking with rollator on up to 5lpm  Cough: much worse since flu / fell and now L cp with cough Sleep: difficult due to cough  SABA use: up to 4 x daily  rec Augmentin 875 mg take one pill twice daily  X 10 days -  For cough > mucinex dm up to 1200 every 12 hours and flutter valve as much as you can then add tylenol #3 one- half to one every 4 hours if needed  Prednisone 10 mg Take 4 for two days three for two days two for two days one for two days    10/05/2017  f/u ov/Wert re:  Copd gold III/ 02 dep on duoneb qid and bud bid nebs Chief Complaint  Patient presents with  . Follow-up    Breathing is overall doing well. She is having some PND- clear sputum.  She is using her albuterol inhaler 2 x per wk on average.   Dyspnea:  Up to church walking up to 5lpm/ no grocery shopping= MMRC3 = can't walk 100 yards even at a slow pace at a flat grade s stopping due to sob   Cough: min throat clearing daytime only  Sleeping:  on flat bed 2 pillows on L side down SABA use: when misses neb cause out of hours 02: 3lpm bump to 5lpm with activity   rec No change rx   07/06/2018  f/u ov/Wert re: copd III criteria but 02 dep 24/7  Chief Complaint  Patient presents with  . Follow-up    Breathing is overall doing well. She uses her albuterol inhaler rarely.    Dyspnea:  6 -8 laps at home/ no steps Cough: mostly throat clearing day >> noct  - does cough right at hs then resolves/ non productive  Sleeping: ok 30 degrees hob  SABA use: rarely 02: 4lpm at home but 2lpm at rest on portable, up to 4lpm Only new problem is constipation since she switched from Zantac to Pepcid.   No obvious day to day or daytime variability or assoc excess/ purulent sputum or mucus plugs or hemoptysis or cp or chest tightness, subjective wheeze or overt sinus or hb symptoms.   sleeping as above  without nocturnal  or early am exacerbation  of respiratory  c/o's or need for noct saba. Also denies any obvious  fluctuation of symptoms with weather or environmental changes or other aggravating or alleviating factors except as outlined above   No unusual exposure hx or h/o childhood pna/ asthma or knowledge of premature birth.  Current Allergies, Complete Past Medical History, Past Surgical History, Family History, and Social History were reviewed in [employer] record.  ROS  The following are not active complaints unless bolded Hoarseness, sore throat, dysphagia, dental problems, itching, sneezing,  nasal congestion or discharge of excess mucus or purulent secretions, ear ache,   fever, chills, sweats, unintended wt loss or wt gain, classically pleuritic or exertional cp,  orthopnea pnd or arm/hand swelling  or leg swelling, presyncope, palpitations, abdominal pain, anorexia, nausea, vomiting, diarrhea  or change in bowel habits or change in bladder habits, change in stools or change in urine, dysuria, hematuria,  rash, arthralgias, visual complaints, headache, numbness, weakness or ataxia or problems with walking or coordination,  change in mood or  memory.        Current Meds  Medication Sig  . acetaminophen (TYLENOL) 500 MG tablet Take 500 mg by mouth every 6 (six) hours as needed (pain).   Marland Kitchen ALPRAZolam (XANAX) 1 MG tablet Take 1 tablet by mouth at bedtime.  . ARIPiprazole (ABILIFY) 5 MG tablet TAKE 1 TABLET DAILY  . aspirin 81 MG tablet Take 81 mg by mouth daily.  Marland Kitchen atorvastatin (LIPITOR) 40 MG tablet TAKE 1 TABLET DAILY  . betaxolol (BETOPTIC-S) 0.25 % ophthalmic suspension Place 1 drop into both eyes every morning.  . brimonidine (ALPHAGAN) 0.2 % ophthalmic solution Place 1 drop into both eyes daily.  . budesonide (PULMICORT) 0.5 MG/2ML nebulizer solution NEBULIZE 1 VIAL TWICE A DAY  . buPROPion (WELLBUTRIN SR) 200 MG 12 hr tablet TAKE (1) TABLET TWICE A DAY.  . chlorpheniramine (CHLOR-TRIMETON) 4 MG tablet Take 4 mg by mouth every 4 (four) hours as needed (drippy nose,  drainage).  . Cholecalciferol (VITAMIN D) 50 MCG (2000 UT) tablet Take 2,000 Units by mouth daily.  . citalopram (CELEXA) 20 MG tablet Take 1 tablet (20 mg total) by mouth daily. Needs to be seen  . dextromethorphan (DELSYM) 30 MG/5ML liquid Take 30 mg 2 (two) times daily as needed by mouth for cough.  . dextromethorphan-guaiFENesin (MUCINEX DM) 30-600 MG 12hr tablet Take 1 tablet 2 (two) times daily as needed by mouth  for cough.  . fenofibrate micronized (LOFIBRA) 134 MG capsule TAKE (1) CAPSULE DAILY BEFORE BREAKFAST.  . furosemide (LASIX) 20 MG tablet Take 1 tablet (20 mg total) by mouth daily as needed for edema.  Marland Kitchen ipratropium-albuterol (DUONEB) 0.5-2.5 (3) MG/3ML SOLN Inhale 3 mLs into the lungs every 6 (six) hours as needed.  Marland Kitchen ketorolac (ACULAR) 0.5 % ophthalmic solution Place 1 drop into both eyes 4 (four) times daily.  Marland Kitchen levothyroxine (SYNTHROID, LEVOTHROID) 50 MCG tablet TAKE 1 TABLET DAILY  . LORazepam (ATIVAN) 0.5 MG tablet Take 1 tablet (0.5 mg total) by mouth 2 (two) times daily as needed for anxiety.  Marland Kitchen loteprednol (LOTEMAX) 0.5 % ophthalmic suspension Place 1 drop into both eyes 2 (two) times daily.  . meclizine (ANTIVERT) 25 MG tablet Take 25 mg every 8 (eight) hours as needed by mouth for dizziness.  . memantine (NAMENDA XR) 28 MG CP24 24 hr capsule Take 1 capsule (28 mg total) by mouth daily. (Needs to be seen beforenext refill)  . metoprolol tartrate (LOPRESSOR) 25 MG tablet TAKE (1/2) TABLET TWICE DAILY.  . montelukast (SINGULAIR) 10 MG tablet TAKE ONE TABLET AT BEDTIME  . nystatin (MYCOSTATIN) 100000 UNIT/ML suspension Take 5 mLs (500,000 Units total) by mouth 4 (four) times daily.  . OXYGEN Oxygen 3lpm 24/7, Increase to 4L as needed for shortness of breath with activity  . polyethylene glycol (MIRALAX / GLYCOLAX) packet Take 8.5 g by mouth daily.  Marland Kitchen PROAIR HFA 108 (90 Base) MCG/ACT inhaler INHALE 2 PUFFS BY MOUTH EVERY 6 HOURS AS NEEDED  . Respiratory Therapy Supplies  (FLUTTER) DEVI As needed  . RHOPRESSA 0.02 % SOLN Place 1 drop into both eyes at bedtime.  . sodium chloride (OCEAN) 0.65 % SOLN nasal spray 2 puffs every 4-6 hours as needed for nasal congestion  . valsartan-hydrochlorothiazide (DIOVAN-HCT) 80-12.5 MG tablet Take 1 tablet by mouth daily. (Please make your 6 mos ckup)                    Objective:  Physical Exam   amb chronically ill wf walking with rollator    Vital signs reviewed - Note on arrival 02 sats  95% on  2lpm poc      Wt 184 01/21/2011  >08/27/2011 186 > 180 09/29/2011 > 10/07/2011  183 > 11/11/2011  181 > 01/13/2012  183 >180 02/12/2012 > 06/09/2012 188 > 189 10/12/2012 >188 01/14/2013 > 01/21/2013 187 > 03/02/2013 >184 03/18/2013 >  03/28/2013 186> 06/06/2013  182 >  07/18/2013  179 >181 09/27/2013 > 12/08/2013   183 > 02/24/2014  178 > 06/09/2014 177 > 09/07/2014 175 > 12/05/2014   173 > 03/06/2015 173>  10/16/2015   171 >  04/28/2016  167 > 11/04/2016  155 >  03/19/2017  146 >  10/05/2017   149 > 07/06/2018  141    HEENT: nl dentition / oropharynx. Nl external ear canals without cough reflex -  Mild bilateral non-specific turbinate edema     NECK :  without JVD/Nodes/TM/ nl carotid upstrokes bilaterally   LUNGS: no acc muscle use,  Mod barrel  contour chest wall with bilateral  Distant bs s audible wheeze and  without cough on insp or exp maneuver and mod  Hyperresonant  to  percussion bilaterally     CV:  RRR  no s3 or murmur or increase in P2, and no edema   ABD:  soft and nontender with pos mid insp Hoover's  in the supine position.  No bruits or organomegaly appreciated, bowel sounds nl  MS:     ext warm without deformities, calf tenderness, cyanosis or clubbing No obvious joint restrictions   SKIN: warm and dry without lesions    NEURO:  alert, approp, nl sensorium with  no motor or cerebellar deficits apparent.           I personally reviewed images and agree with radiology impression as follows:  CXR:   12/22/17   COPD/fibrosis changes.  Mild cardiomegaly.  No active disease

## 2018-07-06 NOTE — Patient Instructions (Addendum)
Stop famotidine  Start omeprazole 20 mg  Take 30- 60 min before your first and last meals of the day   See Tammy NP  In 3 months  with all your medications, even over the counter meds, separated in two separate bags, the ones you take no matter what vs the ones you stop once you feel better and take only as needed when you feel you need them.   Tammy  will generate for you a new user friendly medication calendar that will put [place] all on the same page re: your medication use.     Without this process, it simply isn't possible to assure that we are providing  your outpatient care  with  the attention to detail we feel you deserve.   If we cannot assure that you're getting that kind of care,  then we cannot manage your problem effectively from this clinic.  Once you have seen Tammy and we are sure that we're all on the same page with your medication use she will arrange follow up with me.

## 2018-07-07 ENCOUNTER — Other Ambulatory Visit: Payer: Self-pay | Admitting: *Deleted

## 2018-07-07 ENCOUNTER — Encounter: Payer: Self-pay | Admitting: Internal Medicine

## 2018-07-07 NOTE — Assessment & Plan Note (Signed)
Quit smoking 1992    - PFT's 06/11/2007  FEV1   0.70 (34%) ratio 32 with 49% improvement p B2    - HFA 75% effective p coaching 06/20/2011 > 50% 06/24/2011  -Med calendar 09/27/2013 > did not bring as requested 06/09/14 , 06/05/2015 redone  - improved reconciliation 09/07/2014 and 03/07/2015 and 10/16/2015   - 10/05/2017  After extensive coaching inhaler device,  effectiveness =    50%    She has severe COPD but remains well actively well compensated on her present regimen which I did not change.  I did caution her about COVID restrictions because if she exposes herself she is taking a risk of extreme morbidity.

## 2018-07-07 NOTE — Assessment & Plan Note (Signed)
Evaluated in GI clinic/ [facility]/ Carlean Purl for Lyla Son  - EGD  05/06/10 Pos Es Spasm/ HH with stricture - intol of famotidine so changed to prilosec 20 mg bid  07/06/2018    I had an extended discussion with the patient and daughter reviewing all relevant studies completed to date and  lasting 15 to 20 minutes of a 25 minute visit    Each maintenance medication was reviewed in detail including most importantly the difference between maintenance and prns and under what circumstances the prns are to be triggered using an action plan format that is not reflected in the computer generated alphabetically organized AVS but trather by a customized med calendar that reflects the AVS meds with confirmed 100% correlation.   In addition, Please see AVS for unique instructions that I personally wrote and verbalized to the the pt in detail and then reviewed with pt  by my nurse highlighting any  changes in therapy recommended at today's visit to their plan of care.

## 2018-07-07 NOTE — Telephone Encounter (Signed)
I think patient has home health care but I do not believe she is on hospice, this refill needs to be done during the visit for the alprazolam, okay to send the other 2 refills

## 2018-07-07 NOTE — Assessment & Plan Note (Signed)
Dr Annamaria Boots started 02 around 2010    - 3 lpm 24/7 except 4lpm with activity as of 03/02/2013     - HC03 trending in low 30's since 2012 so likely hypercarbic component as well   - 02/24/2014   Walked RA x one lap @ 185 stopped due to  desat to 83% corrected on 4lpm    - 12/05/2014   Walked 3lpm pulsed x 2 laps @ 185 ft each stopped due to sob with sat 89% at moderate pace    - 03/06/2015   Walked 4lpm 2 laps @ 185 ft each stopped due to  Sob/ desat to 86% at pace faster than her nl  - 11/04/2016  HCO3  =32  -  12/08/2016  Apri notified "does not qualify for POC "  - not clear whether titration attempted.     rx as of 07/06/2018  = 4lpm 24/7  And titrate to keep > 90% with activity      Adequate control on present rx, reviewed in detail with pt > no change in rx needed

## 2018-07-08 ENCOUNTER — Other Ambulatory Visit: Payer: Self-pay

## 2018-07-08 ENCOUNTER — Encounter (INDEPENDENT_AMBULATORY_CARE_PROVIDER_SITE_OTHER): Payer: Medicare Other | Admitting: Ophthalmology

## 2018-07-08 ENCOUNTER — Ambulatory Visit (INDEPENDENT_AMBULATORY_CARE_PROVIDER_SITE_OTHER): Payer: Medicare Other | Admitting: Family Medicine

## 2018-07-08 ENCOUNTER — Encounter: Payer: Self-pay | Admitting: Family Medicine

## 2018-07-08 VITALS — BP 150/74 | HR 101 | Temp 98.4°F | Ht 65.0 in | Wt 141.6 lb

## 2018-07-08 DIAGNOSIS — H353122 Nonexudative age-related macular degeneration, left eye, intermediate dry stage: Secondary | ICD-10-CM | POA: Diagnosis not present

## 2018-07-08 DIAGNOSIS — Z79899 Other long term (current) drug therapy: Secondary | ICD-10-CM

## 2018-07-08 DIAGNOSIS — H35033 Hypertensive retinopathy, bilateral: Secondary | ICD-10-CM | POA: Diagnosis not present

## 2018-07-08 DIAGNOSIS — H43813 Vitreous degeneration, bilateral: Secondary | ICD-10-CM

## 2018-07-08 DIAGNOSIS — F5101 Primary insomnia: Secondary | ICD-10-CM | POA: Diagnosis not present

## 2018-07-08 DIAGNOSIS — H353211 Exudative age-related macular degeneration, right eye, with active choroidal neovascularization: Secondary | ICD-10-CM

## 2018-07-08 DIAGNOSIS — F339 Major depressive disorder, recurrent, unspecified: Secondary | ICD-10-CM

## 2018-07-08 DIAGNOSIS — I10 Essential (primary) hypertension: Secondary | ICD-10-CM

## 2018-07-08 DIAGNOSIS — F419 Anxiety disorder, unspecified: Secondary | ICD-10-CM | POA: Diagnosis not present

## 2018-07-08 MED ORDER — ALPRAZOLAM 1 MG PO TABS: 1.0000 mg | ORAL_TABLET | Freq: Every day | ORAL | 2 refills | Status: DC

## 2018-07-08 NOTE — Progress Notes (Signed)
BP (!) 150/74   Pulse (!) 101   Temp 98.4 F (36.9 C) (Oral)   Ht 5\' 5"  (1.651 m)   Wt 141 lb 9.6 oz (64.2 kg)   BMI 23.56 kg/m    Subjective:   Patient ID: Katie Bryant, female    DOB: 11-15-1928, 85 y.o.   MRN: [id number]  HPI: Katie Bryant is a 85 y.o. female presenting on 07/08/2018 for Medication Refill (xanax)   HPI Anxiety and insomnia assessment: Patient currently uses alprazolam for anxiety and insomnia.  She takes 1 mg as needed at bedtime to help with sleep and calm down.  Patient does have some known dementia with behavioral disturbance and that has been part of the reason that she is had this.  Current rx-alprazolam 1 mg nightly. # meds rx-30 Effectiveness of current meds-work for the most part. Adverse reactions form meds-none but they report  Pill count performed-No Last drug screen -none on record, will do today ( high risk q44m, moderate risk q35m, low risk yearly ) Urine drug screen today- Yes Was the [place] reviewed-yes concerns: None recently but there was some concern that she was getting both lorazepam and alprazolam from 2 different sources early on which was over a year ago.  No flowsheet data found.   Pain contract signed on: 07/08/2018  Relevant past medical, surgical, family and social history reviewed and updated as indicated. Interim medical history since our last visit reviewed. Allergies and medications reviewed and updated.  Review of Systems  Constitutional: Negative for chills and fever.  Eyes: Negative for visual disturbance.  Respiratory: Negative for chest tightness and shortness of breath.   Cardiovascular: Negative for chest pain and leg swelling.  Neurological: Negative for light-headedness and headaches.  Psychiatric/Behavioral: Positive for confusion, dysphoric mood and sleep disturbance. Negative for agitation and behavioral problems. The patient is nervous/anxious.   All other systems reviewed and are negative.   Per HPI  unless specifically indicated above   Allergies as of 07/08/2018   No Known Allergies     Medication List       Accurate as of July 08, 2018  3:30 PM. If you have any questions, ask your nurse or doctor.        acetaminophen 500 MG tablet Commonly known as: TYLENOL Take 500 mg by mouth every 6 (six) hours as needed (pain).   ALPRAZolam 1 MG tablet Commonly known as: XANAX Take 1 tablet (1 mg total) by mouth at bedtime.   ARIPiprazole 5 MG tablet Commonly known as: ABILIFY TAKE 1 TABLET DAILY   aspirin 81 MG tablet Take 81 mg by mouth daily.   atorvastatin 40 MG tablet Commonly known as: LIPITOR TAKE 1 TABLET DAILY   betaxolol 0.25 % ophthalmic suspension Commonly known as: BETOPTIC-S Place 1 drop into both eyes every morning.   brimonidine 0.2 % ophthalmic solution Commonly known as: ALPHAGAN Place 1 drop into both eyes daily.   budesonide 0.5 MG/2ML nebulizer solution Commonly known as: PULMICORT NEBULIZE 1 VIAL TWICE A DAY   buPROPion 200 MG 12 hr tablet Commonly known as: WELLBUTRIN SR TAKE (1) TABLET TWICE A DAY.   chlorpheniramine 4 MG tablet Commonly known as: CHLOR-TRIMETON Take 4 mg by mouth every 4 (four) hours as needed (drippy nose, drainage).   citalopram 20 MG tablet Commonly known as: CELEXA TAKE 1 TABLET DAILY   Delsym 30 MG/5ML liquid Generic drug: dextromethorphan Take 30 mg 2 (two) times daily as needed by mouth for  cough.   dextromethorphan-guaiFENesin 30-600 MG 12hr tablet Commonly known as: MUCINEX DM Take 1 tablet 2 (two) times daily as needed by mouth for cough.   fenofibrate micronized 134 MG capsule Commonly known as: LOFIBRA TAKE (1) CAPSULE DAILY BEFORE BREAKFAST.   Flutter Devi As needed   furosemide 20 MG tablet Commonly known as: LASIX Take 1 tablet (20 mg total) by mouth daily as needed for edema.   ipratropium-albuterol 0.5-2.5 (3) MG/3ML Soln Commonly known as: DUONEB Inhale 3 mLs into the lungs every 6 (six)  hours as needed.   ketorolac 0.5 % ophthalmic solution Commonly known as: ACULAR Place 1 drop into both eyes 4 (four) times daily.   levothyroxine 50 MCG tablet Commonly known as: SYNTHROID TAKE 1 TABLET DAILY   LORazepam 0.5 MG tablet Commonly known as: ATIVAN Take 1 tablet (0.5 mg total) by mouth 2 (two) times daily as needed for anxiety.   loteprednol 0.5 % ophthalmic suspension Commonly known as: LOTEMAX Place 1 drop into both eyes 2 (two) times daily.   meclizine 25 MG tablet Commonly known as: ANTIVERT Take 25 mg every 8 (eight) hours as needed by mouth for dizziness.   memantine 28 MG Cp24 24 hr capsule Commonly known as: NAMENDA XR Take 1 capsule (28 mg total) by mouth daily. (Needs to be seen beforenext refill)   metoprolol tartrate 25 MG tablet Commonly known as: LOPRESSOR TAKE (1/2) TABLET TWICE DAILY.   montelukast 10 MG tablet Commonly known as: SINGULAIR TAKE ONE TABLET AT BEDTIME   nystatin 100000 UNIT/ML suspension Commonly known as: MYCOSTATIN Take 5 mLs (500,000 Units total) by mouth 4 (four) times daily.   omeprazole 20 MG capsule Commonly known as: PRILOSEC Take 30- 60 min before your first and last meals of the day   OXYGEN Oxygen 3lpm 24/7, Increase to 4L as needed for shortness of breath with activity   polyethylene glycol 17 g packet Commonly known as: MIRALAX / GLYCOLAX Take 8.5 g by mouth daily.   ProAir HFA 108 (90 Base) MCG/ACT inhaler Generic drug: albuterol INHALE 2 PUFFS BY MOUTH EVERY 6 HOURS AS NEEDED   Rhopressa 0.02 % Soln Generic drug: Netarsudil Dimesylate Place 1 drop into both eyes at bedtime.   sodium chloride 0.65 % Soln nasal spray Commonly known as: OCEAN 2 puffs every 4-6 hours as needed for nasal congestion   valsartan-hydrochlorothiazide 80-12.5 MG tablet Commonly known as: DIOVAN-HCT TAKE 1 TABLET DAILY   Vitamin D 50 MCG (2000 UT) tablet Take 2,000 Units by mouth daily.        Objective:   BP (!)  150/74   Pulse (!) 101   Temp 98.4 F (36.9 C) (Oral)   Ht 5\' 5"  (1.651 m)   Wt 141 lb 9.6 oz (64.2 kg)   BMI 23.56 kg/m   Wt Readings from Last 3 Encounters:  07/08/18 141 lb 9.6 oz (64.2 kg)  07/06/18 141 lb 3.2 oz (64 kg)  02/24/18 146 lb (66.2 kg)    Physical Exam Vitals signs and nursing note reviewed.  Constitutional:      General: She is not in acute distress.    Appearance: She is well-developed. She is not diaphoretic.  Eyes:     Conjunctiva/sclera: Conjunctivae normal.  Cardiovascular:     Rate and Rhythm: Normal rate and regular rhythm.     Heart sounds: Normal heart sounds. No murmur.  Pulmonary:     Effort: Pulmonary effort is normal. No respiratory distress.  Breath sounds: Normal breath sounds. No wheezing.  Musculoskeletal: Normal range of motion.        General: No tenderness.  Skin:    General: Skin is warm and dry.     Findings: No rash.  Neurological:     Mental Status: She is alert and oriented to person, place, and time.     Coordination: Coordination normal.  Psychiatric:        Mood and Affect: Mood is anxious and depressed.        Behavior: Behavior normal.        Thought Content: Thought content does not include suicidal ideation. Thought content does not include suicidal plan.        Cognition and Memory: Memory is impaired.       Assessment & Plan:   Problem List Items Addressed This Visit      Other   Insomnia - Primary   Relevant Medications   ALPRAZolam (XANAX) 1 MG tablet   Other Relevant Orders   ToxASSURE Select 13 (MW), Urine   Depression, recurrent ([place])   Relevant Medications   ALPRAZolam (XANAX) 1 MG tablet   Other Relevant Orders   ToxASSURE Select 13 (MW), Urine   Anxiety   Relevant Medications   ALPRAZolam (XANAX) 1 MG tablet   Other Relevant Orders   ToxASSURE Select 13 (MW), Urine    Other Visit Diagnoses    Controlled substance agreement signed       Relevant Medications   ALPRAZolam (XANAX) 1 MG tablet    Other Relevant Orders   ToxASSURE Select 13 (MW), Urine    Continue alprazolam, discontinued lorazepam for good.  Her last prescription was in December so she only has a couple left and recommended to not use them anymore.  Follow up plan: Return in about 3 months (around 10/08/2018), or if symptoms worsen or fail to improve, for Anxiety insomnia recheck.  Counseling provided for all of the vaccine components Orders Placed This Encounter  Procedures  . ToxASSURE Select 13 (MW), Urine    Caryl Pina, MD [facility] 07/08/2018, 3:30 PM

## 2018-07-11 LAB — TOXASSURE SELECT 13 (MW), URINE

## 2018-07-14 ENCOUNTER — Ambulatory Visit: Payer: Medicare Other | Admitting: Family Medicine

## 2018-07-14 DIAGNOSIS — J449 Chronic obstructive pulmonary disease, unspecified: Secondary | ICD-10-CM | POA: Diagnosis not present

## 2018-07-21 ENCOUNTER — Other Ambulatory Visit: Payer: Self-pay | Admitting: Family Medicine

## 2018-08-03 ENCOUNTER — Other Ambulatory Visit: Payer: Self-pay | Admitting: Family Medicine

## 2018-08-12 ENCOUNTER — Other Ambulatory Visit: Payer: Self-pay

## 2018-08-12 ENCOUNTER — Encounter (INDEPENDENT_AMBULATORY_CARE_PROVIDER_SITE_OTHER): Payer: Medicare Other | Admitting: Ophthalmology

## 2018-08-12 DIAGNOSIS — H353122 Nonexudative age-related macular degeneration, left eye, intermediate dry stage: Secondary | ICD-10-CM | POA: Diagnosis not present

## 2018-08-12 DIAGNOSIS — H59031 Cystoid macular edema following cataract surgery, right eye: Secondary | ICD-10-CM

## 2018-08-12 DIAGNOSIS — H353211 Exudative age-related macular degeneration, right eye, with active choroidal neovascularization: Secondary | ICD-10-CM

## 2018-08-12 DIAGNOSIS — I10 Essential (primary) hypertension: Secondary | ICD-10-CM

## 2018-08-12 DIAGNOSIS — H35033 Hypertensive retinopathy, bilateral: Secondary | ICD-10-CM | POA: Diagnosis not present

## 2018-08-12 DIAGNOSIS — H43813 Vitreous degeneration, bilateral: Secondary | ICD-10-CM

## 2018-08-14 DIAGNOSIS — J449 Chronic obstructive pulmonary disease, unspecified: Secondary | ICD-10-CM | POA: Diagnosis not present

## 2018-08-26 IMAGING — CR DG CHEST 2V
2 series · 2 of 2 positions shown · non-contrast
Comparison: Chest x-ray of June 09, 2014

CLINICAL DATA: Increasing shortness of breath and hoarseness over
the past several days. History of COPD, former smoker.

EXAM:
CHEST  2 VIEW

[w chest pa]
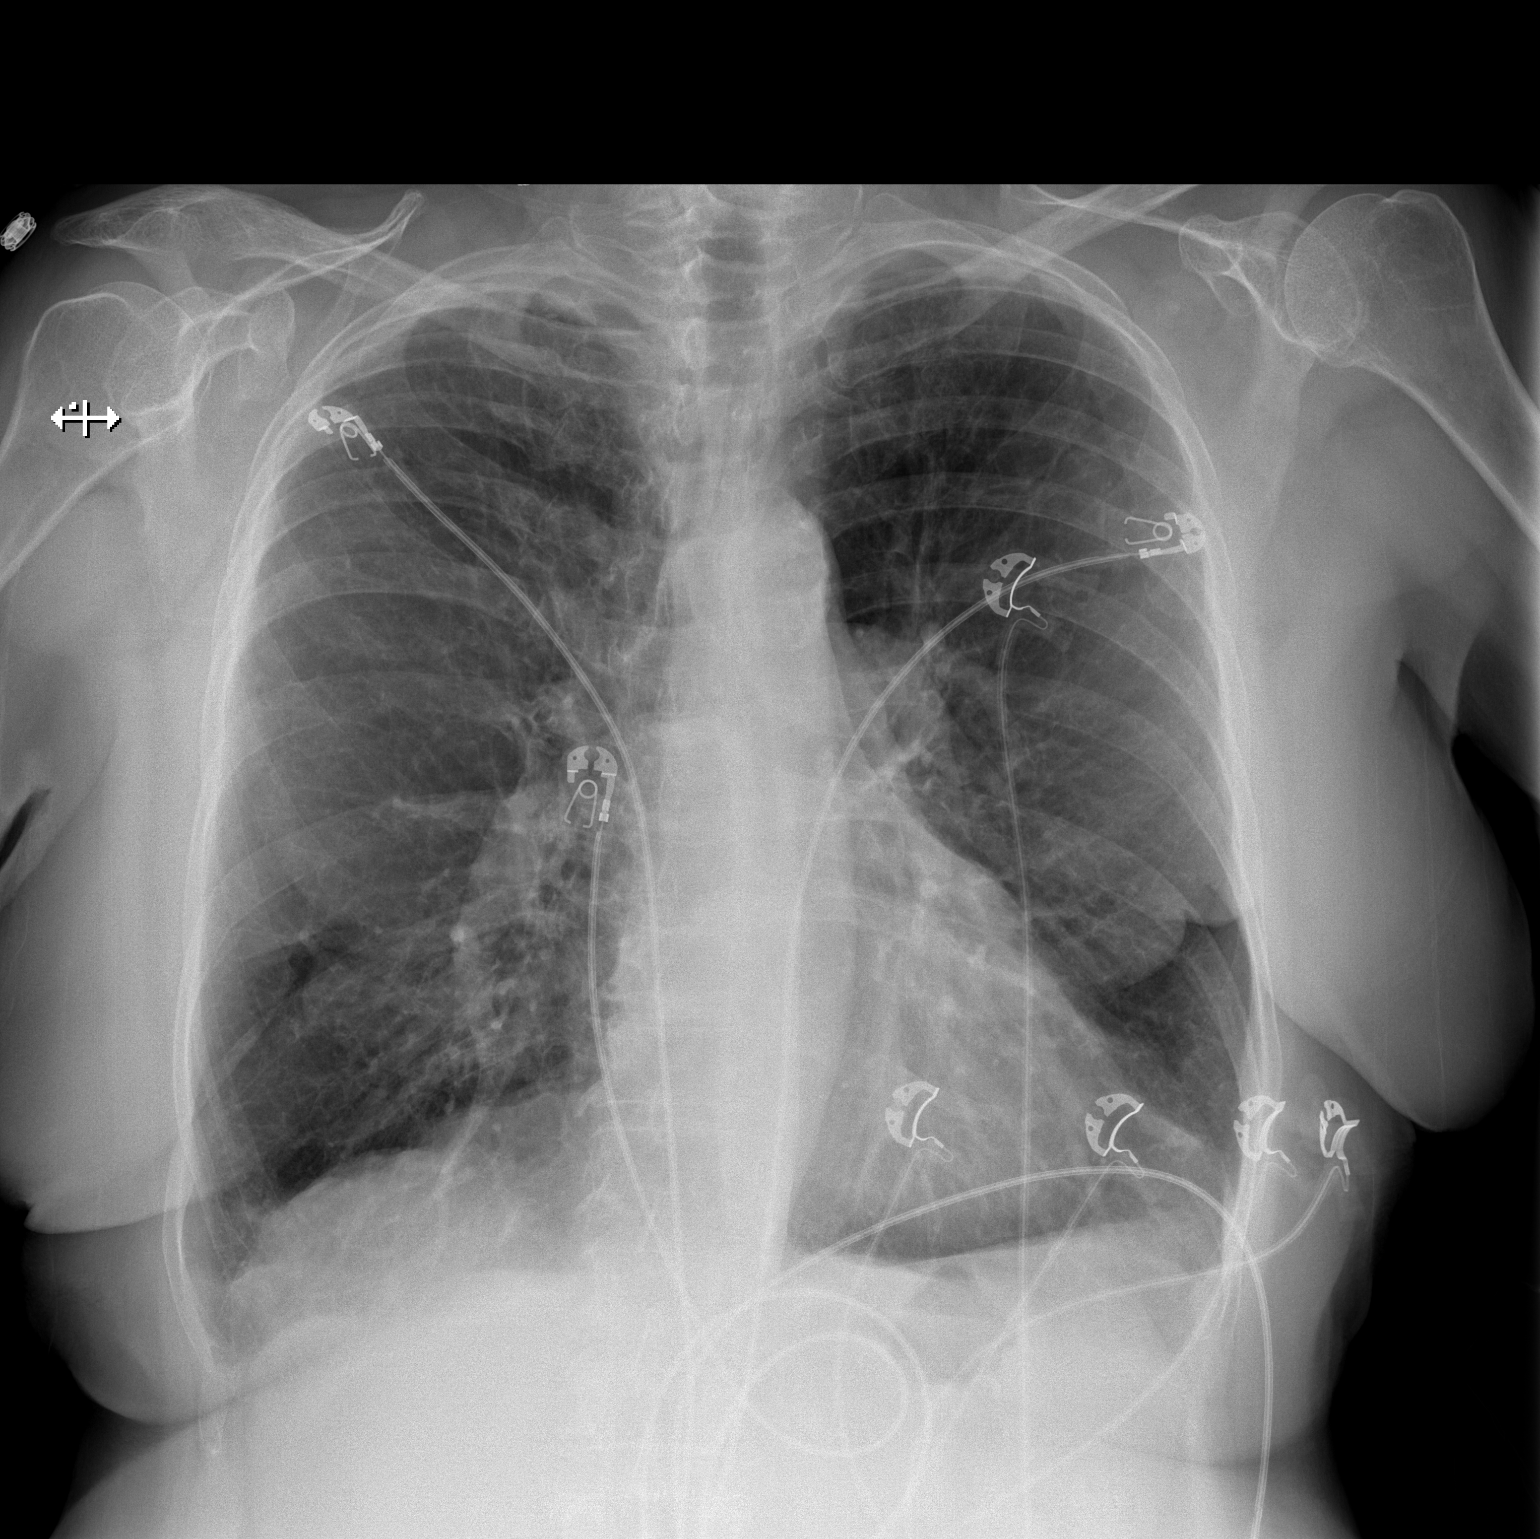

[w chest lat]
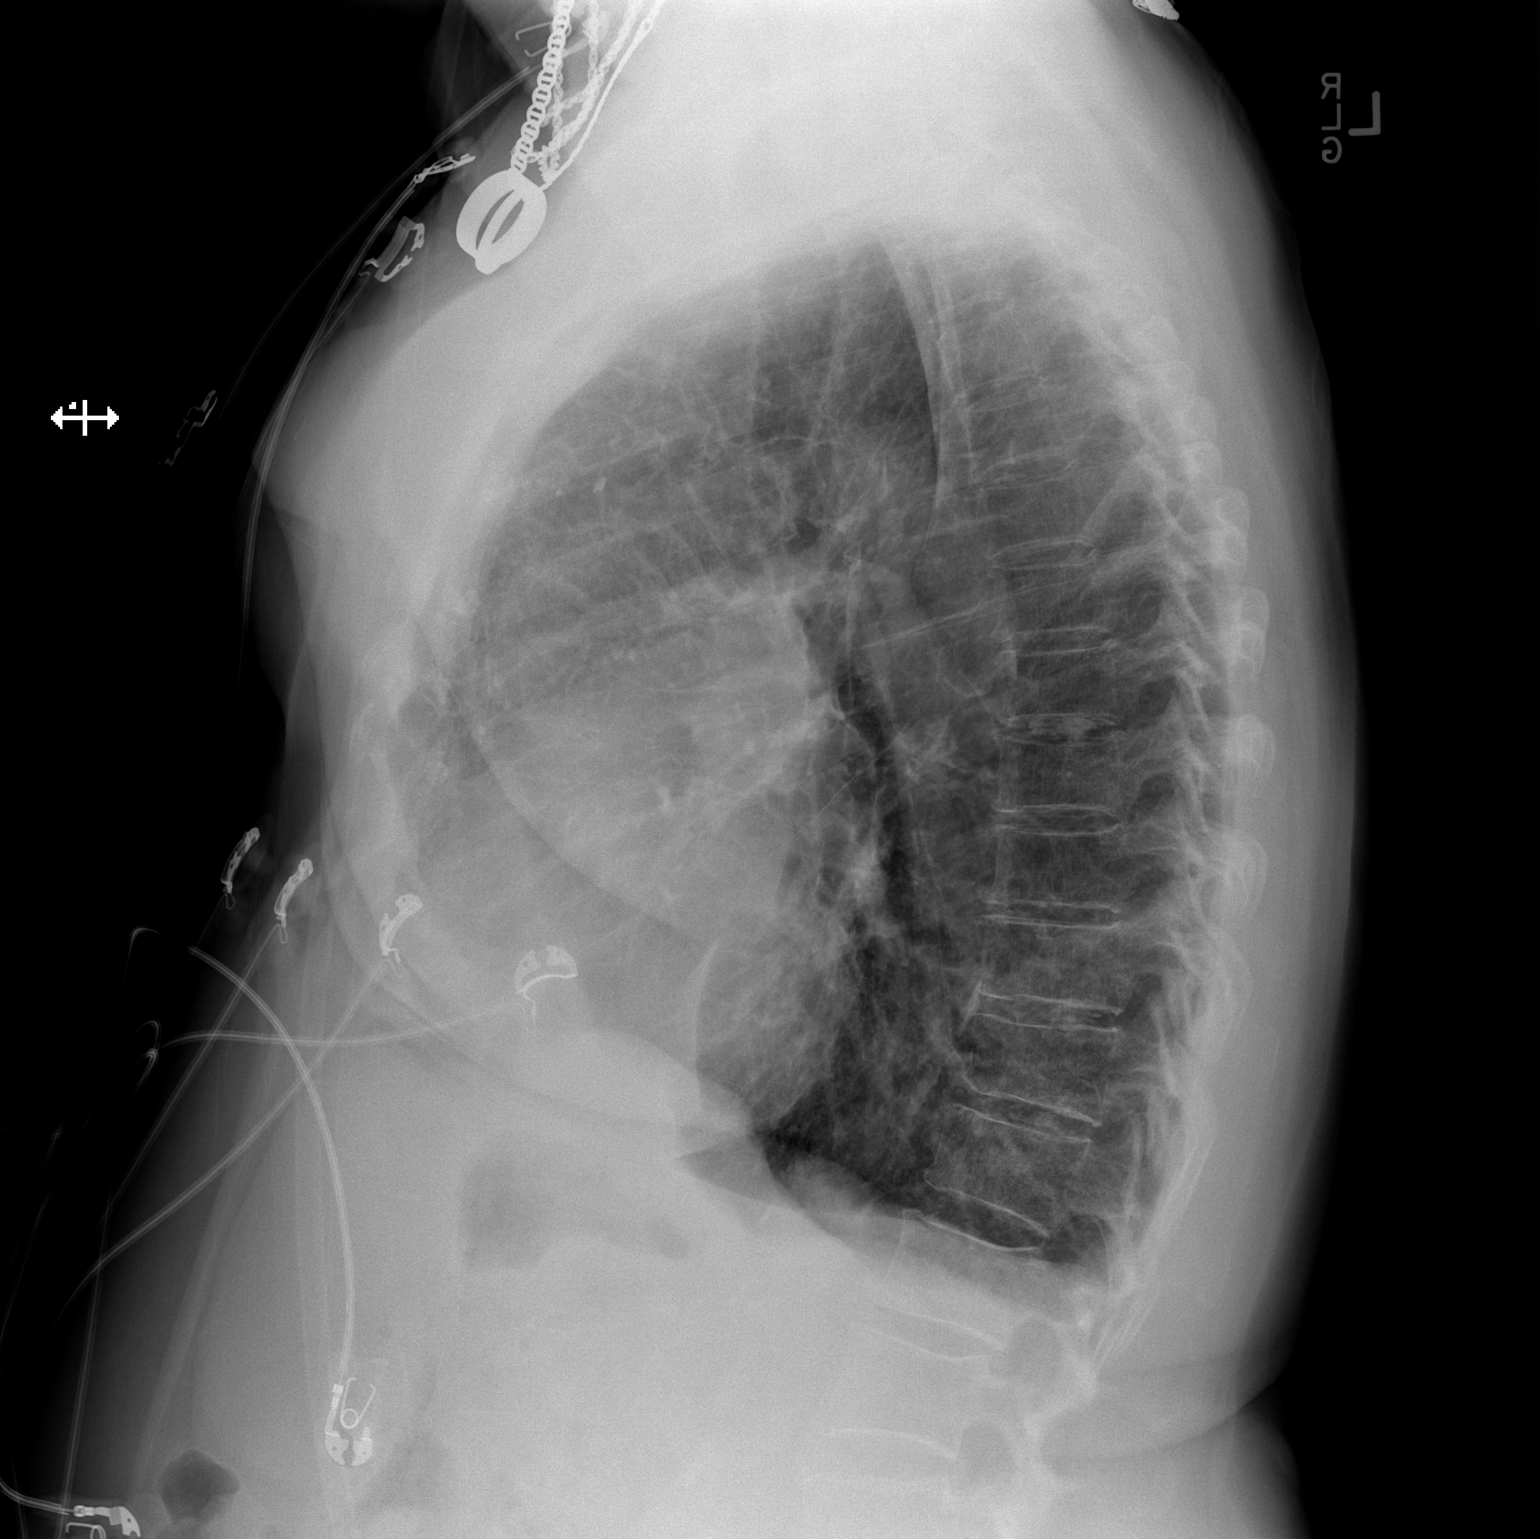

[2 of 2 positions shown; findings below may reference images not displayed]

FINDINGS: The lungs are hyperinflated with hemidiaphragm flattening. The
interstitial markings are increased bilaterally though this is not
new. There is no alveolar infiltrate or pleural effusion. The heart
is normal in size and contour. There is chronic central pulmonary
vascular prominence. There is calcification in the wall of the
aortic arch. The mediastinum is normal in width. The bony thorax
exhibits no acute abnormality.
IMPRESSION: COPD with chronic interstitial changes. Chronic central pulmonary
vascular prominence may reflect pulmonary hypertension.

Thoracic aortic atherosclerosis.

## 2018-08-27 ENCOUNTER — Other Ambulatory Visit: Payer: Self-pay | Admitting: Cardiology

## 2018-08-31 ENCOUNTER — Other Ambulatory Visit: Payer: Self-pay | Admitting: Family Medicine

## 2018-08-31 DIAGNOSIS — H40013 Open angle with borderline findings, low risk, bilateral: Secondary | ICD-10-CM | POA: Diagnosis not present

## 2018-08-31 DIAGNOSIS — H353231 Exudative age-related macular degeneration, bilateral, with active choroidal neovascularization: Secondary | ICD-10-CM | POA: Diagnosis not present

## 2018-08-31 DIAGNOSIS — Z961 Presence of intraocular lens: Secondary | ICD-10-CM | POA: Diagnosis not present

## 2018-09-08 ENCOUNTER — Other Ambulatory Visit: Payer: Self-pay

## 2018-09-08 ENCOUNTER — Encounter (INDEPENDENT_AMBULATORY_CARE_PROVIDER_SITE_OTHER): Payer: Medicare Other | Admitting: Ophthalmology

## 2018-09-08 DIAGNOSIS — I10 Essential (primary) hypertension: Secondary | ICD-10-CM | POA: Diagnosis not present

## 2018-09-08 DIAGNOSIS — H35033 Hypertensive retinopathy, bilateral: Secondary | ICD-10-CM | POA: Diagnosis not present

## 2018-09-08 DIAGNOSIS — H43813 Vitreous degeneration, bilateral: Secondary | ICD-10-CM | POA: Diagnosis not present

## 2018-09-08 DIAGNOSIS — H59031 Cystoid macular edema following cataract surgery, right eye: Secondary | ICD-10-CM | POA: Diagnosis not present

## 2018-09-08 DIAGNOSIS — H353231 Exudative age-related macular degeneration, bilateral, with active choroidal neovascularization: Secondary | ICD-10-CM

## 2018-09-21 NOTE — Progress Notes (Signed)
HPI The patient presents for followup of aortic stenosis. She has severe COPD on O2.  She returns for routine follow up.  At the last visit because of dizziness I stopped her HCTZ.  She has Lewy body dementia.  Despite all of this she is doing OK.  The patient denies any new symptoms such as chest discomfort, neck or arm discomfort. There has been no new shortness of breath, PND or orthopnea. There have been no reported palpitations, presyncope or syncope.  She gets around slowly with a walker.    No Known Allergies  Current Outpatient Medications  Medication Sig Dispense Refill  . acetaminophen (TYLENOL) 500 MG tablet Take 500 mg by mouth every 6 (six) hours as needed (pain).     Marland Kitchen ALPRAZolam (XANAX) 1 MG tablet Take 1 tablet (1 mg total) by mouth at bedtime. 30 tablet 2  . ARIPiprazole (ABILIFY) 5 MG tablet TAKE 1 TABLET DAILY 30 tablet 0  . aspirin 81 MG tablet Take 81 mg by mouth daily.    Marland Kitchen atorvastatin (LIPITOR) 40 MG tablet TAKE 1 TABLET DAILY 90 tablet 0  . betaxolol (BETOPTIC-S) 0.25 % ophthalmic suspension Place 1 drop into both eyes every morning.    . brimonidine (ALPHAGAN) 0.2 % ophthalmic solution Place 1 drop into both eyes daily.    . budesonide (PULMICORT) 0.5 MG/2ML nebulizer solution NEBULIZE 1 VIAL TWICE A DAY 120 mL 3  . buPROPion (WELLBUTRIN SR) 200 MG 12 hr tablet TAKE (1) TABLET TWICE A DAY. 60 tablet 2  . chlorpheniramine (CHLOR-TRIMETON) 4 MG tablet Take 4 mg by mouth every 4 (four) hours as needed (drippy nose, drainage).    . Cholecalciferol (VITAMIN D) 50 MCG (2000 UT) tablet Take 2,000 Units by mouth daily.    . citalopram (CELEXA) 20 MG tablet TAKE 1 TABLET DAILY 30 tablet 2  . dextromethorphan (DELSYM) 30 MG/5ML liquid Take 30 mg 2 (two) times daily as needed by mouth for cough.    . dextromethorphan-guaiFENesin (MUCINEX DM) 30-600 MG 12hr tablet Take 1 tablet 2 (two) times daily as needed by mouth for cough.    . fenofibrate micronized (LOFIBRA) 134 MG  capsule TAKE (1) CAPSULE DAILY BEFORE BREAKFAST. 90 capsule 0  . ipratropium-albuterol (DUONEB) 0.5-2.5 (3) MG/3ML SOLN Inhale 3 mLs into the lungs every 6 (six) hours as needed. 360 mL 2  . ketorolac (ACULAR) 0.5 % ophthalmic solution Place 1 drop into both eyes 4 (four) times daily.    Marland Kitchen levothyroxine (SYNTHROID, LEVOTHROID) 50 MCG tablet TAKE 1 TABLET DAILY 30 tablet 10  . LORazepam (ATIVAN) 0.5 MG tablet Take 1 tablet (0.5 mg total) by mouth 2 (two) times daily as needed for anxiety. 30 tablet 0  . loteprednol (LOTEMAX) 0.5 % ophthalmic suspension Place 1 drop into both eyes 2 (two) times daily.    . meclizine (ANTIVERT) 25 MG tablet Take 25 mg every 8 (eight) hours as needed by mouth for dizziness.    . memantine (NAMENDA XR) 28 MG CP24 24 hr capsule Take 1 capsule (28 mg total) by mouth daily. (Needs to be seen beforenext refill) 90 capsule 3  . metoprolol tartrate (LOPRESSOR) 25 MG tablet TAKE (1/2) TABLET TWICE DAILY. 90 tablet 0  . montelukast (SINGULAIR) 10 MG tablet TAKE ONE TABLET AT BEDTIME 30 tablet 2  . polyethylene glycol (MIRALAX / GLYCOLAX) packet Take 8.5 g by mouth daily.    Marland Kitchen PROAIR HFA 108 (90 Base) MCG/ACT inhaler INHALE 2 PUFFS BY MOUTH EVERY  6 HOURS AS NEEDED 8.5 g 5  . RHOPRESSA 0.02 % SOLN Place 1 drop into both eyes at bedtime.    . sodium chloride (OCEAN) 0.65 % SOLN nasal spray 2 puffs every 4-6 hours as needed for nasal congestion    . valsartan-hydrochlorothiazide (DIOVAN-HCT) 80-12.5 MG tablet TAKE 1 TABLET DAILY 90 tablet 0  . OXYGEN Oxygen 3lpm 24/7, Increase to 4L as needed for shortness of breath with activity    . Respiratory Therapy Supplies (FLUTTER) DEVI As needed     No current facility-administered medications for this visit.     Past Medical History:  Diagnosis Date  . Alzheimer's disease ([place]) 03/29/2015  . Anxiety   . Aortic stenosis, mild   . Arthritis   . Asthma   . Back pain   . Breast cancer ([place])   . Carotid artery disease ([place])  01/01/2017  . COPD (chronic obstructive pulmonary disease) ([place])   . Dementia   . Depression   . GERD (gastroesophageal reflux disease)   . H/O hiatal hernia   . Hypertension   . Hypothyroidism 06/25/2016  . Lymphadenopathy 10/07/2012  . Obesity   . Oxygen dependent   . Pneumonia   . Right hamstring muscle strain 04/26/2013  . Sleep apnea     Past Surgical History:  Procedure Laterality Date  . APPENDECTOMY    . BREAST RECONSTRUCTION    . CARDIAC CATHETERIZATION     EF 55-60%  . MASTECTOMY  1980  . TONSILLECTOMY AND ADENOIDECTOMY  1942    ROS:    As stated in the HPI and negative for all other systems.   PHYSICAL EXAM BP 124/60   Pulse 84   Ht 5\' 5"  (1.651 m)   Wt 135 lb (61.2 kg)   BMI 22.47 kg/m   GENERAL:  Frail appearing NECK:  No jugular venous distention, waveform within normal limits, carotid upstroke brisk and symmetric, no bruits, no thyromegaly LUNGS:  Clear to auscultation bilaterally CHEST:  Unremarkable HEART:  PMI not displaced or sustained,S1 and S2 within normal limits, no S3, no S4, no clicks, no rubs, 2 out of 6 apical early peaking systolic murmur murmurs ABD:  Flat, positive bowel sounds normal in frequency in pitch, no bruits, no rebound, no guarding, no midline pulsatile mass, no hepatomegaly, no splenomegaly EXT:  2 plus pulses throughout, no edema, no cyanosis no clubbing   EKG: Sinus rhythm, rate 84, left axis deviation, interventricular conduction delay, borderline QT prolongation.  ASSESSMENT AND PLAN  Aortic stenosis - This has been mild aortic stenosis.  No change in therapy.  No further imaging   HTN (hypertension) -  Her blood pressure is actually coming down and she is lost weight slowly over time.  I gave her daughter instructions to discontinue the beta-blocker if her blood pressure continues to go down or if she has any dizziness.  She actually has not had much dizziness recently but if this worsens she could be off the  beta-blocker.   Carotid stenosis - This was mild two years ago.  No change in therapy.  No further imaging at this point.

## 2018-09-22 ENCOUNTER — Encounter: Payer: Self-pay | Admitting: Cardiology

## 2018-09-22 ENCOUNTER — Other Ambulatory Visit: Payer: Self-pay

## 2018-09-22 ENCOUNTER — Ambulatory Visit (INDEPENDENT_AMBULATORY_CARE_PROVIDER_SITE_OTHER): Payer: Medicare Other | Admitting: Cardiology

## 2018-09-22 VITALS — BP 124/60 | HR 84 | Ht 65.0 in | Wt 135.0 lb

## 2018-09-22 DIAGNOSIS — I350 Nonrheumatic aortic (valve) stenosis: Secondary | ICD-10-CM

## 2018-09-22 DIAGNOSIS — I10 Essential (primary) hypertension: Secondary | ICD-10-CM | POA: Diagnosis not present

## 2018-09-22 DIAGNOSIS — R42 Dizziness and giddiness: Secondary | ICD-10-CM | POA: Diagnosis not present

## 2018-09-22 NOTE — Patient Instructions (Signed)
Medication Instructions:  The current medical regimen is effective;  continue present plan and medications.  If you need a refill on your cardiac medications before your next appointment, please call your pharmacy.   Follow-Up: Follow up in 1 year with Dr. Hochrein.  You will receive a letter in the mail 2 months before you are due.  Please call us when you receive this letter to schedule your follow up appointment.  Thank you for choosing [facility]!!     

## 2018-09-27 ENCOUNTER — Encounter: Payer: Self-pay | Admitting: Nurse Practitioner

## 2018-09-27 ENCOUNTER — Other Ambulatory Visit: Payer: Self-pay | Admitting: Family Medicine

## 2018-09-27 ENCOUNTER — Other Ambulatory Visit: Payer: Self-pay | Admitting: Internal Medicine

## 2018-09-27 ENCOUNTER — Ambulatory Visit (INDEPENDENT_AMBULATORY_CARE_PROVIDER_SITE_OTHER): Payer: Medicare Other | Admitting: Nurse Practitioner

## 2018-09-27 ENCOUNTER — Other Ambulatory Visit: Payer: Self-pay

## 2018-09-27 ENCOUNTER — Other Ambulatory Visit: Payer: Self-pay | Admitting: Cardiology

## 2018-09-27 DIAGNOSIS — N3000 Acute cystitis without hematuria: Secondary | ICD-10-CM

## 2018-09-27 MED ORDER — CEPHALEXIN 500 MG PO CAPS: 500.0000 mg | ORAL_CAPSULE | Freq: Two times a day (BID) | ORAL | 0 refills | Status: DC

## 2018-09-27 NOTE — Progress Notes (Signed)
   Virtual Visit via telephone Note Due to COVID-19 pandemic this visit was conducted virtually. This visit type was conducted due to national recommendations for restrictions regarding the COVID-19 Pandemic (e.g. social distancing, sheltering in place) in an effort to limit this patient's exposure and mitigate transmission in our community. All issues noted in this document were discussed and addressed.  A physical exam was not performed with this format.  I connected with Katie Bryant on 09/27/18 at 12:45 by telephone and verified that I am speaking with the correct person using two identifiers. Katie Bryant is currently located at home and her daughter is currently with her during visit. The provider, Mary-Margaret Hassell Done, FNP is located in their office at time of visit.  I discussed the limitations, risks, security and privacy concerns of performing an evaluation and management service by telephone and the availability of in person appointments. I also discussed with the patient that there may be a patient responsible charge related to this service. The patient expressed understanding and agreed to proceed.   History and Present Illness:  Katie Bryant in today with chief complaint of Urinary Tract Infection   Patient daughter calls in for patient because she is not able to talk on the phone. She has been c/o burning with  Urination for 2 days. They did an OTC testing which tested positive for nitrites. AZO OTC has helped with  Burning.    Review of Systems  Genitourinary: Positive for dysuria, frequency and urgency. Negative for flank pain and hematuria.  All other systems reviewed and are negative.    Observations/Objective: Unable to speak with patient over the phone. Daughter says she is not able to hear on phone.  Assessment and Plan: Katie Bryant in today with chief complaint of Urinary Tract Infection   1. Acute cystitis without hematuria Take medication as  prescribe Cotton underwear Take shower not bath Cranberry juice, yogurt Force fluids AZO over the counter X2 days RTO prn Meds ordered this encounter  Medications  . cephALEXin (KEFLEX) 500 MG capsule    Sig: Take 1 capsule (500 mg total) by mouth 2 (two) times daily.    Dispense:  14 capsule    Refill:  0    Order Specific Question:   Supervising Provider    Answer:   Caryl Pina A [id number]      Follow Up Instructions: prn    I discussed the assessment and treatment plan with the patient. The patient was provided an opportunity to ask questions and all were answered. The patient agreed with the plan and demonstrated an understanding of the instructions.   The patient was advised to call back or seek an in-person evaluation if the symptoms worsen or if the condition fails to improve as anticipated.  The above assessment and management plan was discussed with the patient. The patient verbalized understanding of and has agreed to the management plan. Patient is aware to call the clinic if symptoms persist or worsen. Patient is aware when to return to the clinic for a follow-up visit. Patient educated on when it is appropriate to go to the emergency department.   Time call ended:  12:55  I provided 10 minutes of non-face-to-face time during this encounter.    Mary-Margaret Hassell Done, FNP

## 2018-09-28 ENCOUNTER — Ambulatory Visit: Payer: Medicare Other

## 2018-09-29 ENCOUNTER — Other Ambulatory Visit: Payer: Self-pay | Admitting: Family Medicine

## 2018-09-30 ENCOUNTER — Telehealth: Payer: Self-pay | Admitting: Family Medicine

## 2018-09-30 NOTE — Telephone Encounter (Signed)
Apt scheduled.  

## 2018-10-04 ENCOUNTER — Encounter: Payer: Self-pay | Admitting: Family Medicine

## 2018-10-04 ENCOUNTER — Ambulatory Visit (INDEPENDENT_AMBULATORY_CARE_PROVIDER_SITE_OTHER): Payer: Medicare Other | Admitting: Family Medicine

## 2018-10-04 DIAGNOSIS — F5101 Primary insomnia: Secondary | ICD-10-CM | POA: Diagnosis not present

## 2018-10-04 DIAGNOSIS — Z79899 Other long term (current) drug therapy: Secondary | ICD-10-CM | POA: Diagnosis not present

## 2018-10-04 DIAGNOSIS — F339 Major depressive disorder, recurrent, unspecified: Secondary | ICD-10-CM

## 2018-10-04 DIAGNOSIS — F419 Anxiety disorder, unspecified: Secondary | ICD-10-CM

## 2018-10-04 MED ORDER — ALPRAZOLAM 1 MG PO TABS: 1.0000 mg | ORAL_TABLET | Freq: Every day | ORAL | 2 refills | Status: DC

## 2018-10-04 MED ORDER — CITALOPRAM HYDROBROMIDE 20 MG PO TABS: 20.0000 mg | ORAL_TABLET | Freq: Every day | ORAL | 3 refills | Status: DC

## 2018-10-04 MED ORDER — ARIPIPRAZOLE 2 MG PO TABS: 2.0000 mg | ORAL_TABLET | Freq: Every day | ORAL | 3 refills | Status: DC

## 2018-10-04 NOTE — Progress Notes (Signed)
Virtual Visit via telephone Note  I connected with Katie Bryant on 10/04/18 at 1434 by telephone and verified that I am speaking with the correct person using two identifiers. TREYA ALTER is currently located at home and daughter are currently with her during visit. The provider, Fransisca Kaufmann Dettinger, MD is located in their office at time of visit.  Call ended at 1445  I discussed the limitations, risks, security and privacy concerns of performing an evaluation and management service by telephone and the availability of in person appointments. I also discussed with the patient that there may be a patient responsible charge related to this service. The patient expressed understanding and agreed to proceed.   History and Present Illness: Anxiety and depression recheck Patient is calling in about anxiety and depression and feels like appetite is improved and she is taking Wellbutrin and citalopram. Patient denies suicidal ideations or thoughts of hurting herself.  Her memory is stable and doing well.  No diagnosis found.  Outpatient Encounter Medications as of 10/04/2018  Medication Sig  . acetaminophen (TYLENOL) 500 MG tablet Take 500 mg by mouth every 6 (six) hours as needed (pain).   Marland Kitchen ALPRAZolam (XANAX) 1 MG tablet Take 1 tablet (1 mg total) by mouth at bedtime.  . ARIPiprazole (ABILIFY) 5 MG tablet TAKE 1 TABLET DAILY  . aspirin 81 MG tablet Take 81 mg by mouth daily.  Marland Kitchen atorvastatin (LIPITOR) 40 MG tablet TAKE 1 TABLET DAILY  . betaxolol (BETOPTIC-S) 0.25 % ophthalmic suspension Place 1 drop into both eyes every morning.  . brimonidine (ALPHAGAN) 0.2 % ophthalmic solution Place 1 drop into both eyes daily.  . budesonide (PULMICORT) 0.5 MG/2ML nebulizer solution NEBULIZE 1 VIAL TWICE A DAY  . buPROPion (WELLBUTRIN SR) 200 MG 12 hr tablet TAKE (1) TABLET TWICE A DAY.  . cephALEXin (KEFLEX) 500 MG capsule Take 1 capsule (500 mg total) by mouth 2 (two) times daily.  .  chlorpheniramine (CHLOR-TRIMETON) 4 MG tablet Take 4 mg by mouth every 4 (four) hours as needed (drippy nose, drainage).  . Cholecalciferol (VITAMIN D) 50 MCG (2000 UT) tablet Take 2,000 Units by mouth daily.  . citalopram (CELEXA) 20 MG tablet TAKE 1 TABLET DAILY  . dextromethorphan (DELSYM) 30 MG/5ML liquid Take 30 mg 2 (two) times daily as needed by mouth for cough.  . dextromethorphan-guaiFENesin (MUCINEX DM) 30-600 MG 12hr tablet Take 1 tablet 2 (two) times daily as needed by mouth for cough.  . fenofibrate micronized (LOFIBRA) 134 MG capsule TAKE (1) CAPSULE DAILY BEFORE BREAKFAST.  Marland Kitchen ipratropium-albuterol (DUONEB) 0.5-2.5 (3) MG/3ML SOLN Inhale 3 mLs into the lungs every 6 (six) hours as needed.  Marland Kitchen ketorolac (ACULAR) 0.5 % ophthalmic solution Place 1 drop into both eyes 4 (four) times daily.  Marland Kitchen levothyroxine (SYNTHROID, LEVOTHROID) 50 MCG tablet TAKE 1 TABLET DAILY  . LORazepam (ATIVAN) 0.5 MG tablet Take 1 tablet (0.5 mg total) by mouth 2 (two) times daily as needed for anxiety.  Marland Kitchen loteprednol (LOTEMAX) 0.5 % ophthalmic suspension Place 1 drop into both eyes 2 (two) times daily.  . meclizine (ANTIVERT) 25 MG tablet Take 25 mg every 8 (eight) hours as needed by mouth for dizziness.  . memantine (NAMENDA XR) 28 MG CP24 24 hr capsule Take 1 capsule (28 mg total) by mouth daily. (Needs to be seen beforenext refill)  . metoprolol tartrate (LOPRESSOR) 25 MG tablet TAKE (1/2) TABLET TWICE DAILY.  . montelukast (SINGULAIR) 10 MG tablet TAKE ONE TABLET AT BEDTIME  .  OXYGEN Oxygen 3lpm 24/7, Increase to 4L as needed for shortness of breath with activity  . polyethylene glycol (MIRALAX / GLYCOLAX) packet Take 8.5 g by mouth daily.  Marland Kitchen PROAIR HFA 108 (90 Base) MCG/ACT inhaler INHALE 2 PUFFS BY MOUTH EVERY 6 HOURS AS NEEDED  . Respiratory Therapy Supplies (FLUTTER) DEVI As needed  . RHOPRESSA 0.02 % SOLN Place 1 drop into both eyes at bedtime.  . sodium chloride (OCEAN) 0.65 % SOLN nasal spray 2 puffs  every 4-6 hours as needed for nasal congestion  . valsartan-hydrochlorothiazide (DIOVAN-HCT) 80-12.5 MG tablet TAKE 1 TABLET DAILY   No facility-administered encounter medications on file as of 10/04/2018.     Review of Systems  Constitutional: Negative for chills and fever.  Eyes: Negative for visual disturbance.  Respiratory: Negative for chest tightness and shortness of breath.   Cardiovascular: Negative for chest pain and leg swelling.  Musculoskeletal: Negative for back pain and gait problem.  Skin: Negative for rash.  Neurological: Negative for dizziness, light-headedness and headaches.  Psychiatric/Behavioral: Negative for agitation and behavioral problems.  All other systems reviewed and are negative.   Observations/Objective: Patient sounds comfortable and in no acute distress.   Assessment and Plan: Problem List Items Addressed This Visit      Other   Insomnia   Relevant Medications   ARIPiprazole (ABILIFY) 2 MG tablet   ALPRAZolam (XANAX) 1 MG tablet   citalopram (CELEXA) 20 MG tablet   Depression, recurrent (HCC)   Relevant Medications   ARIPiprazole (ABILIFY) 2 MG tablet   ALPRAZolam (XANAX) 1 MG tablet   citalopram (CELEXA) 20 MG tablet   Anxiety   Relevant Medications   ARIPiprazole (ABILIFY) 2 MG tablet   ALPRAZolam (XANAX) 1 MG tablet   citalopram (CELEXA) 20 MG tablet    Other Visit Diagnoses    Controlled substance agreement signed       Relevant Medications   ARIPiprazole (ABILIFY) 2 MG tablet   ALPRAZolam (XANAX) 1 MG tablet   citalopram (CELEXA) 20 MG tablet       Follow Up Instructions:  F/u in 3 months   I discussed the assessment and treatment plan with the patient. The patient was provided an opportunity to ask questions and all were answered. The patient agreed with the plan and demonstrated an understanding of the instructions.   The patient was advised to call back or seek an in-person evaluation if the symptoms worsen or if the  condition fails to improve as anticipated.  The above assessment and management plan was discussed with the patient. The patient verbalized understanding of and has agreed to the management plan. Patient is aware to call the clinic if symptoms persist or worsen. Patient is aware when to return to the clinic for a follow-up visit. Patient educated on when it is appropriate to go to the emergency department.    I provided 11 minutes of non-face-to-face time during this encounter.    Worthy Rancher, MD

## 2018-10-06 ENCOUNTER — Encounter: Payer: Self-pay | Admitting: Adult Health

## 2018-10-06 ENCOUNTER — Ambulatory Visit: Payer: Medicare Other | Admitting: Adult Health

## 2018-10-06 ENCOUNTER — Other Ambulatory Visit: Payer: Self-pay

## 2018-10-06 DIAGNOSIS — J449 Chronic obstructive pulmonary disease, unspecified: Secondary | ICD-10-CM

## 2018-10-06 DIAGNOSIS — J9612 Chronic respiratory failure with hypercapnia: Secondary | ICD-10-CM

## 2018-10-06 NOTE — Progress Notes (Signed)
@Patient  ID: Katie Bryant, female    DOB: May 13, 1928, 85 y.o.   MRN: [id number]  Chief Complaint  Patient presents with  . Follow-up    COPD     Referring provider: Dettinger, Fransisca Kaufmann, MD  HPI: 85 year old female former smoker followed for gold 3 COPD and oxygen dependent respiratory failure Patient has dementia her daughter is her caregiver  TEST/EVENTS :  PFT's 06/11/2007 FEV1 0.70 (34%) ratio 32 with 49% improvement p B2  10/06/2018 Follow up : COPD , O2 RF , Med Review  Patient returns for a 48-month follow-up.  She has underlying severe COPD that is oxygen dependent on 4l/m .  Overall says her breathing is doing good.  She is accompanied by her daughter.  She has had no recent flare of her COPD.  She remains on Pulmicort nebulizer twice daily and DuoNeb nebulizer 4 times daily. Last visit Pepcid was stopped and omeprazole was increased to twice a day due to GI issues /constipation . Says this is much better.  No change in oxygen level, remains on 4l/m . No change in her oxygen demands. We reviewed all her meds updated her medication calendar .  She gets her meds throught [facility] who sorts meds and makes med packets .    No Known Allergies  Immunization History  Administered Date(s) Administered  . Influenza Split 10/07/2010, 09/19/2012, 09/28/2012  . Influenza Whole 10/07/2011  . Influenza, High Dose Seasonal PF 09/23/2013, 10/19/2014, 10/03/2015, 10/15/2016, 09/30/2017  . Influenza,inj,quad, With Preservative 10/06/2016  . Pneumococcal Conjugate-13 10/19/2014  . Pneumococcal Polysaccharide-23 01/06/2009  . Td 03/06/2016    Past Medical History:  Diagnosis Date  . Alzheimer's disease ([place]) 03/29/2015  . Anxiety   . Aortic stenosis, mild   . Arthritis   . Asthma   . Back pain   . Breast cancer ([place])   . Carotid artery disease ([place]) 01/01/2017  . COPD (chronic obstructive pulmonary disease) ([place])   . Dementia   . Depression   . GERD  (gastroesophageal reflux disease)   . H/O hiatal hernia   . Hypertension   . Hypothyroidism 06/25/2016  . Lymphadenopathy 10/07/2012  . Obesity   . Oxygen dependent   . Pneumonia   . Right hamstring muscle strain 04/26/2013  . Sleep apnea     Tobacco History: Social History   Tobacco Use  Smoking Status Former Smoker  . Packs/day: 1.00  . Years: 40.00  . Pack years: 40.00  . Types: Cigarettes  . Quit date: 02/11/1990  . Years since quitting: 28.6  Smokeless Tobacco Never Used   Counseling given: Not Answered   Outpatient Medications Prior to Visit  Medication Sig Dispense Refill  . acetaminophen (TYLENOL) 500 MG tablet Take 500 mg by mouth every 6 (six) hours as needed (pain).     Marland Kitchen ALPRAZolam (XANAX) 1 MG tablet Take 1 tablet (1 mg total) by mouth at bedtime. 30 tablet 2  . ARIPiprazole (ABILIFY) 2 MG tablet Take 1 tablet (2 mg total) by mouth daily. 90 tablet 3  . aspirin 81 MG tablet Take 81 mg by mouth daily.    Marland Kitchen atorvastatin (LIPITOR) 40 MG tablet TAKE 1 TABLET DAILY 90 tablet 0  . betaxolol (BETOPTIC-S) 0.25 % ophthalmic suspension Place 1 drop into both eyes every morning.    . brimonidine (ALPHAGAN) 0.2 % ophthalmic solution Place 1 drop into both eyes daily.    . budesonide (PULMICORT) 0.5 MG/2ML nebulizer solution NEBULIZE 1 VIAL TWICE A DAY  120 mL 1  . buPROPion (WELLBUTRIN SR) 200 MG 12 hr tablet TAKE (1) TABLET TWICE A DAY. 60 tablet 2  . chlorpheniramine (CHLOR-TRIMETON) 4 MG tablet Take 4 mg by mouth every 4 (four) hours as needed (drippy nose, drainage).    . Cholecalciferol (VITAMIN D) 50 MCG (2000 UT) tablet Take 2,000 Units by mouth daily.    . citalopram (CELEXA) 20 MG tablet Take 1 tablet (20 mg total) by mouth daily. 90 tablet 3  . dextromethorphan (DELSYM) 30 MG/5ML liquid Take 30 mg 2 (two) times daily as needed by mouth for cough.    . dextromethorphan-guaiFENesin (MUCINEX DM) 30-600 MG 12hr tablet Take 1 tablet 2 (two) times daily as needed by mouth  for cough.    . fenofibrate micronized (LOFIBRA) 134 MG capsule TAKE (1) CAPSULE DAILY BEFORE BREAKFAST. 90 capsule 0  . ipratropium-albuterol (DUONEB) 0.5-2.5 (3) MG/3ML SOLN Inhale 3 mLs into the lungs every 6 (six) hours as needed. 360 mL 2  . ketorolac (ACULAR) 0.5 % ophthalmic solution Place 1 drop into both eyes 4 (four) times daily.    Marland Kitchen levothyroxine (SYNTHROID, LEVOTHROID) 50 MCG tablet TAKE 1 TABLET DAILY 30 tablet 10  . loteprednol (LOTEMAX) 0.5 % ophthalmic suspension Place 1 drop into both eyes 2 (two) times daily.    . meclizine (ANTIVERT) 25 MG tablet Take 25 mg every 8 (eight) hours as needed by mouth for dizziness.    . memantine (NAMENDA XR) 28 MG CP24 24 hr capsule Take 1 capsule (28 mg total) by mouth daily. (Needs to be seen beforenext refill) 90 capsule 3  . metoprolol tartrate (LOPRESSOR) 25 MG tablet TAKE (1/2) TABLET TWICE DAILY. 90 tablet 0  . montelukast (SINGULAIR) 10 MG tablet TAKE ONE TABLET AT BEDTIME 30 tablet 2  . OXYGEN Oxygen 3lpm 24/7, Increase to 4L as needed for shortness of breath with activity    . polyethylene glycol (MIRALAX / GLYCOLAX) packet Take 8.5 g by mouth daily.    Marland Kitchen PROAIR HFA 108 (90 Base) MCG/ACT inhaler INHALE 2 PUFFS BY MOUTH EVERY 6 HOURS AS NEEDED 8.5 g 5  . Respiratory Therapy Supplies (FLUTTER) DEVI As needed    . RHOPRESSA 0.02 % SOLN Place 1 drop into both eyes at bedtime.    . sodium chloride (OCEAN) 0.65 % SOLN nasal spray 2 puffs every 4-6 hours as needed for nasal congestion    . valsartan-hydrochlorothiazide (DIOVAN-HCT) 80-12.5 MG tablet TAKE 1 TABLET DAILY 90 tablet 0   No facility-administered medications prior to visit.      Review of Systems:   Constitutional:   No  weight loss, night sweats,  Fevers, chills,  +fatigue, or  lassitude.  HEENT:   No headaches,  Difficulty swallowing,  Tooth/dental problems, or  Sore throat,                No sneezing, itching, ear ache, nasal congestion, post nasal drip,   CV:  No  chest pain,  Orthopnea, PND, swelling in lower extremities, anasarca, dizziness, palpitations, syncope.   GI  No heartburn, indigestion, abdominal pain, nausea, vomiting, diarrhea, change in bowel habits, loss of appetite, bloody stools.   Resp:  .  No excess mucus, no productive cough,  No non-productive cough,  No coughing up of blood.  No change in color of mucus.  No wheezing.  No chest wall deformity  Skin: no rash or lesions.  GU: no dysuria, change in color of urine, no urgency or frequency.  No  flank pain, no hematuria   MS:  No joint pain or swelling.  No decreased range of motion.  No back pain.    Physical Exam  BP 132/64 (BP Location: Left Arm, Cuff Size: Normal)   Pulse (!) 56   Temp (!) 97.3 F (36.3 C) (Temporal)   Ht 5\' 5"  (1.651 m)   Wt 135 lb (61.2 kg)   SpO2 100%   BMI 22.47 kg/m   GEN: A/Ox3; pleasant , NAD, elderly , on O2 , rolling walker    HEENT:  /AT,  , NOSE-clear, THROAT-clear, no lesions, no postnasal drip or exudate noted.   NECK:  Supple w/ fair ROM; no JVD; normal carotid impulses w/o bruits; no thyromegaly or nodules palpated; no lymphadenopathy.    RESP  Decreased BS in bases  no accessory muscle use, no dullness to percussion  CARD:  RRR, no m/r/g, no peripheral edema, pulses intact, no cyanosis or clubbing.  GI:   Soft & nt; nml bowel sounds; no organomegaly or masses detected.   Musco: Warm bil, no deformities or joint swelling noted.   Neuro: alert, no focal deficits noted.    Skin: Warm, no lesions or rashes    Lab Results:  CBC  BMET  BNP No results found for: BNP  ProBNP  Imaging: No results found.    No flowsheet data found.  No results found for: NITRICOXIDE      Assessment & Plan:   COPD GOLD III/ 02 dep  Very severe COPD -doing well on current regimen   Patient's medications were reviewed today and patient education was given. Computerized medication calendar was adjusted/completed   Plan   Patient Instructions  Continue on Pulmicort Neb Twice daily   Continue on [employer] Four times a day  .  Continue on Oxygen 4l/m  May use Mucinex DM with flutter As needed  Cough/congestion  Activity as tolerated.  Follow med calendar closely and bring to each visit .  Follow up with Dr. [name] or   In 4 months and As needed         Chronic respiratory failure with hypercapnia ([place]) Continue on O2 . -no change in O2 demands  O2 sat goal is >88-90%.       Rexene Edison, NP 10/06/2018

## 2018-10-06 NOTE — Assessment & Plan Note (Signed)
Continue on O2 . -no change in O2 demands  O2 sat goal is >88-90%.

## 2018-10-06 NOTE — Assessment & Plan Note (Signed)
Very severe COPD -doing well on current regimen   Patient's medications were reviewed today and patient education was given. Computerized medication calendar was adjusted/completed   Plan  Patient Instructions  Continue on Pulmicort Neb Twice daily   Continue on [employer] Four times a day  .  Continue on Oxygen 4l/m  May use Mucinex DM with flutter As needed  Cough/congestion  Activity as tolerated.  Follow med calendar closely and bring to each visit .  Follow up with Dr. [name] or Kennadi [place]  In 4 months and As needed

## 2018-10-06 NOTE — Patient Instructions (Addendum)
Continue on Pulmicort Neb Twice daily   Continue on [employer] Four times a day  .  Continue on Oxygen 4l/m  May use Mucinex DM with flutter As needed  Cough/congestion  Activity as tolerated.  Follow med calendar closely and bring to each visit .  Follow up with Dr. [name] or Denaly Gatling  In 4 months and As needed

## 2018-10-08 NOTE — Addendum Note (Signed)
Addended by: Parke Poisson E on: 10/08/2018 01:01 PM   Modules accepted: Orders

## 2018-10-12 ENCOUNTER — Other Ambulatory Visit: Payer: Self-pay

## 2018-10-13 ENCOUNTER — Other Ambulatory Visit: Payer: Self-pay | Admitting: Family Medicine

## 2018-10-14 ENCOUNTER — Encounter (INDEPENDENT_AMBULATORY_CARE_PROVIDER_SITE_OTHER): Payer: Medicare Other | Admitting: Ophthalmology

## 2018-10-14 ENCOUNTER — Other Ambulatory Visit (INDEPENDENT_AMBULATORY_CARE_PROVIDER_SITE_OTHER): Payer: Medicare Other

## 2018-10-14 ENCOUNTER — Other Ambulatory Visit: Payer: Self-pay

## 2018-10-14 ENCOUNTER — Other Ambulatory Visit: Payer: Self-pay | Admitting: Family Medicine

## 2018-10-14 DIAGNOSIS — E039 Hypothyroidism, unspecified: Secondary | ICD-10-CM

## 2018-10-14 DIAGNOSIS — M1990 Unspecified osteoarthritis, unspecified site: Secondary | ICD-10-CM

## 2018-10-14 DIAGNOSIS — J449 Chronic obstructive pulmonary disease, unspecified: Secondary | ICD-10-CM

## 2018-10-14 DIAGNOSIS — I10 Essential (primary) hypertension: Secondary | ICD-10-CM | POA: Diagnosis not present

## 2018-10-14 DIAGNOSIS — Z23 Encounter for immunization: Secondary | ICD-10-CM

## 2018-10-14 DIAGNOSIS — H35033 Hypertensive retinopathy, bilateral: Secondary | ICD-10-CM | POA: Diagnosis not present

## 2018-10-14 DIAGNOSIS — H43813 Vitreous degeneration, bilateral: Secondary | ICD-10-CM

## 2018-10-14 DIAGNOSIS — H353231 Exudative age-related macular degeneration, bilateral, with active choroidal neovascularization: Secondary | ICD-10-CM | POA: Diagnosis not present

## 2018-10-14 NOTE — Progress Notes (Signed)
Placed orders for patient

## 2018-10-15 LAB — CMP14+EGFR
ALT: 18 IU/L (ref 0–32)
AST: 30 IU/L (ref 0–40)
Albumin/Globulin Ratio: 1.8 (ref 1.2–2.2)
Albumin: 4 g/dL (ref 3.6–4.6)
Alkaline Phosphatase: 44 IU/L (ref 39–117)
BUN/Creatinine Ratio: 20 (ref 12–28)
BUN: 20 mg/dL (ref 8–27)
Bilirubin Total: 0.3 mg/dL (ref 0.0–1.2)
CO2: 28 mmol/L (ref 20–29)
Calcium: 9.6 mg/dL (ref 8.7–10.3)
Chloride: 99 mmol/L (ref 96–106)
Creatinine, Ser: 1.01 mg/dL — ABNORMAL HIGH (ref 0.57–1.00)
GFR calc Af Amer: 58 mL/min/{1.73_m2} — ABNORMAL LOW (ref >59)
GFR calc non Af Amer: 50 mL/min/{1.73_m2} — ABNORMAL LOW (ref >59)
Globulin, Total: 2.2 g/dL (ref 1.5–4.5)
Glucose: 95 mg/dL (ref 65–99)
Potassium: 4.1 mmol/L (ref 3.5–5.2)
Sodium: 138 mmol/L (ref 134–144)
Total Protein: 6.2 g/dL (ref 6.0–8.5)

## 2018-10-15 LAB — CBC WITH DIFFERENTIAL/PLATELET
Basophils Absolute: 0.1 10*3/uL (ref 0.0–0.2)
Basos: 1 %
EOS (ABSOLUTE): 0.1 10*3/uL (ref 0.0–0.4)
Eos: 2 %
Hematocrit: 34.6 % (ref 34.0–46.6)
Hemoglobin: 11 g/dL — ABNORMAL LOW (ref 11.1–15.9)
Immature Grans (Abs): 0 10*3/uL (ref 0.0–0.1)
Immature Granulocytes: 0 %
Lymphocytes Absolute: 1.7 10*3/uL (ref 0.7–3.1)
Lymphs: 24 %
MCH: 28.5 pg (ref 26.6–33.0)
MCHC: 31.8 g/dL (ref 31.5–35.7)
MCV: 90 fL (ref 79–97)
Monocytes Absolute: 0.5 10*3/uL (ref 0.1–0.9)
Monocytes: 8 %
Neutrophils Absolute: 4.6 10*3/uL (ref 1.4–7.0)
Neutrophils: 65 %
Platelets: 200 10*3/uL (ref 150–450)
RBC: 3.86 x10E6/uL (ref 3.77–5.28)
RDW: 12 % (ref 11.7–15.4)
WBC: 6.9 10*3/uL (ref 3.4–10.8)

## 2018-10-15 LAB — LIPID PANEL
Chol/HDL Ratio: 2.2 ratio (ref 0.0–4.4)
Cholesterol, Total: 168 mg/dL (ref 100–199)
HDL: 78 mg/dL (ref >39)
LDL Chol Calc (NIH): 77 mg/dL (ref 0–99)
Triglycerides: 66 mg/dL (ref 0–149)
VLDL Cholesterol Cal: 13 mg/dL (ref 5–40)

## 2018-10-15 LAB — TSH: TSH: 2.25 u[IU]/mL (ref 0.450–4.500)

## 2018-10-27 ENCOUNTER — Other Ambulatory Visit: Payer: Self-pay | Admitting: Family Medicine

## 2018-11-11 IMAGING — DX DG CHEST 2V
2 series · 2 of 2 positions shown · non-contrast
Comparison: Chest x-ray of August 19, 2016

CLINICAL DATA: Chronic shortness of breath, 4 days of cough.
History of asthma, COPD, previous episodes of pneumonia, former
smoker.

EXAM:
CHEST  2 VIEW

[chest pa]
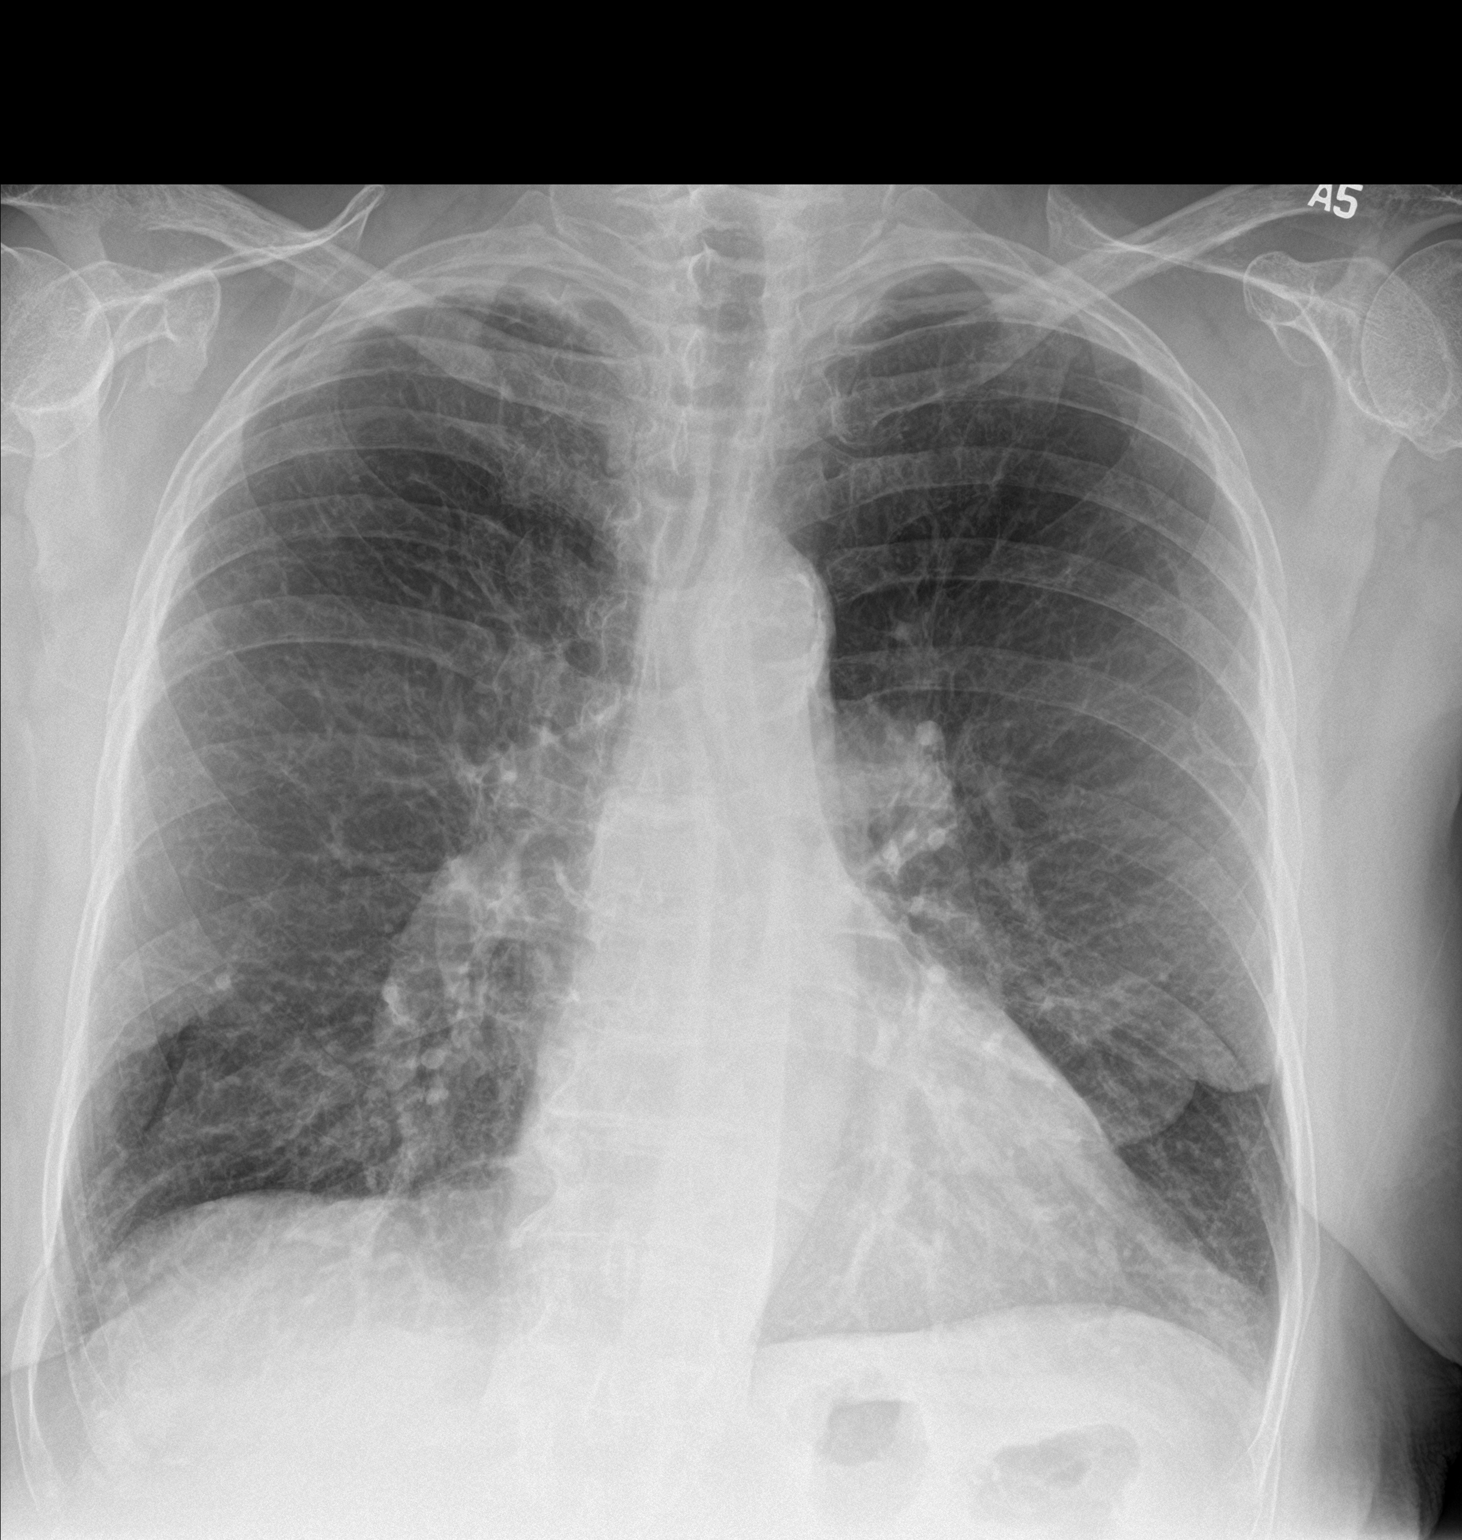

[chest lat]
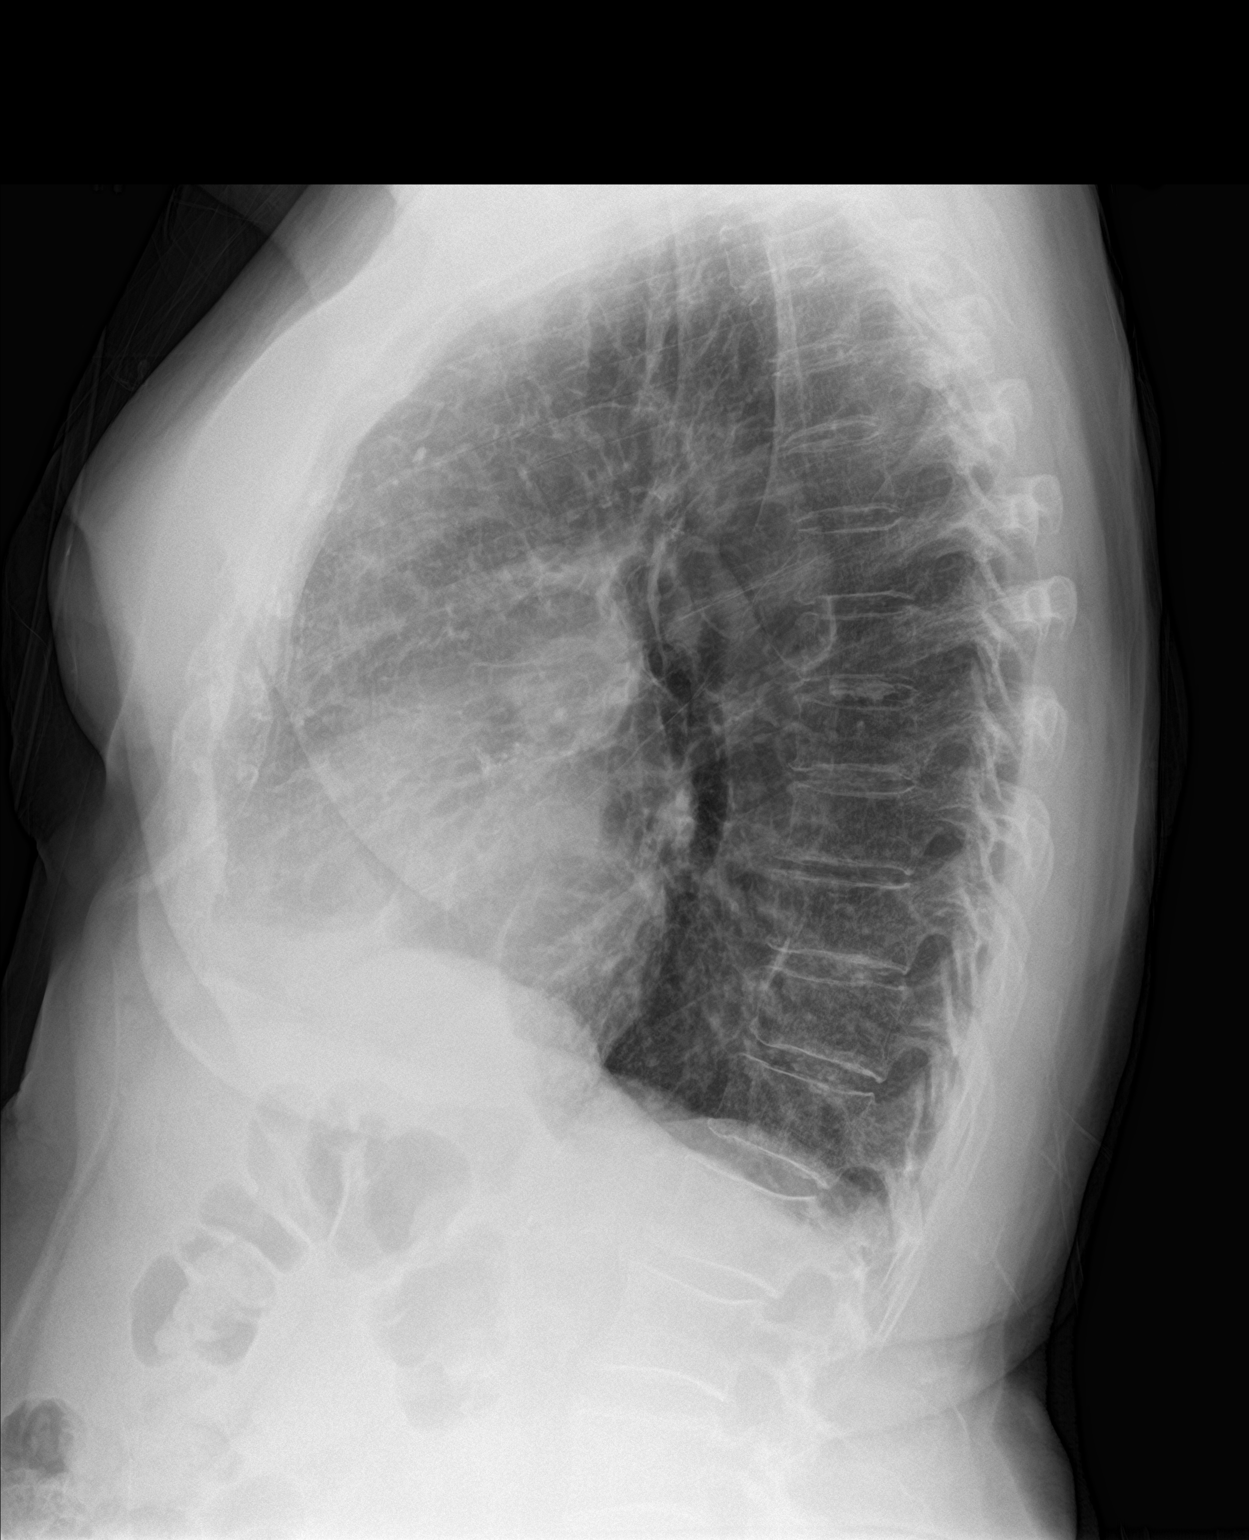

[2 of 2 positions shown; findings below may reference images not displayed]

FINDINGS: The lungs remain hyperinflated with hemidiaphragm flattening. The
interstitial markings are coarse and more conspicuous than on the
previous study. There is no alveolar infiltrate. The heart and
peripheral pulmonary vascularity are within the limits of normal.
The central pulmonary vascular prominence persists. There is
calcification in the wall of the thoracic aorta. The bony thorax is
unremarkable.
IMPRESSION: COPD. Possible superimposed acute bronchitis. No evidence of CHF or
alveolar pneumonia.

Thoracic aortic atherosclerosis.

## 2018-11-18 ENCOUNTER — Other Ambulatory Visit: Payer: Self-pay

## 2018-11-18 ENCOUNTER — Encounter (INDEPENDENT_AMBULATORY_CARE_PROVIDER_SITE_OTHER): Payer: Medicare Other | Admitting: Ophthalmology

## 2018-11-18 DIAGNOSIS — I10 Essential (primary) hypertension: Secondary | ICD-10-CM | POA: Diagnosis not present

## 2018-11-18 DIAGNOSIS — H35033 Hypertensive retinopathy, bilateral: Secondary | ICD-10-CM

## 2018-11-18 DIAGNOSIS — H43813 Vitreous degeneration, bilateral: Secondary | ICD-10-CM | POA: Diagnosis not present

## 2018-11-18 DIAGNOSIS — H353231 Exudative age-related macular degeneration, bilateral, with active choroidal neovascularization: Secondary | ICD-10-CM

## 2018-11-24 ENCOUNTER — Other Ambulatory Visit: Payer: Self-pay | Admitting: Family Medicine

## 2018-11-24 ENCOUNTER — Other Ambulatory Visit: Payer: Self-pay | Admitting: Cardiology

## 2018-12-08 ENCOUNTER — Other Ambulatory Visit: Payer: Self-pay | Admitting: Family Medicine

## 2018-12-13 ENCOUNTER — Other Ambulatory Visit: Payer: Self-pay | Admitting: Internal Medicine

## 2018-12-21 ENCOUNTER — Other Ambulatory Visit: Payer: Self-pay | Admitting: Family Medicine

## 2018-12-27 ENCOUNTER — Other Ambulatory Visit: Payer: Self-pay | Admitting: Cardiology

## 2018-12-27 ENCOUNTER — Other Ambulatory Visit: Payer: Self-pay | Admitting: Family Medicine

## 2018-12-27 DIAGNOSIS — Z79899 Other long term (current) drug therapy: Secondary | ICD-10-CM

## 2018-12-27 DIAGNOSIS — F5101 Primary insomnia: Secondary | ICD-10-CM

## 2018-12-27 DIAGNOSIS — F339 Major depressive disorder, recurrent, unspecified: Secondary | ICD-10-CM

## 2018-12-27 DIAGNOSIS — F419 Anxiety disorder, unspecified: Secondary | ICD-10-CM

## 2019-01-03 ENCOUNTER — Telehealth: Payer: Self-pay | Admitting: Family Medicine

## 2019-01-03 NOTE — Telephone Encounter (Signed)
Patient is responsible for making appts for controlled substances.  She needs OV for medication.  Cannot send in controlled substance without OV.  Can make same day appt if needed for bridge until PCP returns.

## 2019-01-03 NOTE — Telephone Encounter (Signed)
Daughter aware and states they will wait until her appointment on the 6th

## 2019-01-12 ENCOUNTER — Ambulatory Visit (INDEPENDENT_AMBULATORY_CARE_PROVIDER_SITE_OTHER): Payer: Medicare Other | Admitting: Family Medicine

## 2019-01-12 ENCOUNTER — Encounter: Payer: Self-pay | Admitting: Family Medicine

## 2019-01-12 DIAGNOSIS — F02818 Dementia in other diseases classified elsewhere, unspecified severity, with other behavioral disturbance: Secondary | ICD-10-CM

## 2019-01-12 DIAGNOSIS — J449 Chronic obstructive pulmonary disease, unspecified: Secondary | ICD-10-CM | POA: Diagnosis not present

## 2019-01-12 DIAGNOSIS — F419 Anxiety disorder, unspecified: Secondary | ICD-10-CM | POA: Diagnosis not present

## 2019-01-12 DIAGNOSIS — F339 Major depressive disorder, recurrent, unspecified: Secondary | ICD-10-CM

## 2019-01-12 DIAGNOSIS — I10 Essential (primary) hypertension: Secondary | ICD-10-CM

## 2019-01-12 DIAGNOSIS — E039 Hypothyroidism, unspecified: Secondary | ICD-10-CM

## 2019-01-12 DIAGNOSIS — G301 Alzheimer's disease with late onset: Secondary | ICD-10-CM | POA: Diagnosis not present

## 2019-01-12 DIAGNOSIS — Z79899 Other long term (current) drug therapy: Secondary | ICD-10-CM

## 2019-01-12 DIAGNOSIS — F5101 Primary insomnia: Secondary | ICD-10-CM

## 2019-01-12 DIAGNOSIS — F0281 Dementia in other diseases classified elsewhere with behavioral disturbance: Secondary | ICD-10-CM

## 2019-01-12 MED ORDER — AEROCHAMBER PLUS W/MASK SMALL MISC: 1.0000 | Freq: Once | 1 refills | Status: AC

## 2019-01-12 MED ORDER — ALPRAZOLAM 1 MG PO TABS: 1.0000 mg | ORAL_TABLET | Freq: Every day | ORAL | 2 refills | Status: DC

## 2019-01-12 NOTE — Progress Notes (Signed)
Virtual Visit via telephone Note  I connected with Katie Bryant on 01/12/19 at 1108 by telephone and verified that I am speaking with the correct person using two identifiers. Katie Bryant is currently located at home and daughter are currently with her during visit. The provider, [name], MD is located in their office at time of visit.  Call ended at 1126  I discussed the limitations, risks, security and privacy concerns of performing an evaluation and management service by telephone and the availability of in person appointments. I also discussed with the patient that there may be a patient responsible charge related to this service. The patient expressed understanding and agreed to proceed.  112/56 97% on 4LNC History and Present Illness: Depression and anxiety Patient is doing better with mood and anxiety and her daughter living with her is helping, xanax is do for refill.  Current rx-Xanax 1 mg nightly as needed # meds rx-30 Effectiveness of current meds-works well, only uses at night Adverse reactions form meds-none  Pill count performed-No Last drug screen -07/13/2018 ( high risk q39m, moderate risk q13m, low risk yearly ) Urine drug screen today- No Was the [place] reviewed-yes  If yes were their any concerning findings? -None  No flowsheet data found.   Controlled substance contract signed on: 07/13/2018  Hypertension Patient is currently on valsartan hydrochlorothiazide and metoprolol, and their blood pressure today is 112/56. Patient denies any lightheadedness or dizziness. Patient denies headaches, blurred vision, chest pains, shortness of breath, or weakness. Denies any side effects from medication and is content with current medication.   Hyperlipidemia Patient is coming in for recheck of his hyperlipidemia. The patient is currently taking atorvastatin. They deny any issues with myalgias or history of liver damage from it. They deny any focal numbness or  weakness or chest pain.  Patient's memory is coming and going and she does take care of herself most of the time.   COPD Patient is coming in for COPD recheck today.  Patient is on 4 L nasal cannula he is currently on oxygen and budesonide and ProAir, she has been having some trouble using the inhalers correctly per her daughter and will send a spacer.  He has a mild chronic cough but denies any major coughing spells or wheezing spells.  He has 5nighttime symptoms per week and 5daytime symptoms per week currently. Patient sees Dr [name] for pulmonologist  No diagnosis found.  Outpatient Encounter Medications as of 01/12/2019  Medication Sig  . acetaminophen (TYLENOL) 500 MG tablet Take 500 mg by mouth every 6 (six) hours as needed (pain).   Marland Kitchen ALPRAZolam (XANAX) 1 MG tablet Take 1 tablet (1 mg total) by mouth at bedtime.  . ARIPiprazole (ABILIFY) 2 MG tablet Take 1 tablet (2 mg total) by mouth daily.  Marland Kitchen aspirin 81 MG tablet Take 81 mg by mouth daily.  Marland Kitchen atorvastatin (LIPITOR) 40 MG tablet TAKE 1 TABLET DAILY  . betaxolol (BETOPTIC-S) 0.25 % ophthalmic suspension Place 1 drop into both eyes every morning.  . brimonidine (ALPHAGAN) 0.2 % ophthalmic solution Place 1 drop into both eyes daily.  . budesonide (PULMICORT) 0.5 MG/2ML nebulizer solution NEBULIZE 1 VIAL TWICE A DAY  . buPROPion (WELLBUTRIN SR) 200 MG 12 hr tablet TAKE (1) TABLET TWICE A DAY.  . chlorpheniramine (CHLOR-TRIMETON) 4 MG tablet Take 4 mg by mouth every 4 (four) hours as needed (drippy nose, drainage).  . Cholecalciferol (VITAMIN D) 50 MCG (2000 UT) tablet Take 2,000 Units  by mouth daily.  . citalopram (CELEXA) 20 MG tablet Take 1 tablet (20 mg total) by mouth daily.  Marland Kitchen dextromethorphan (DELSYM) 30 MG/5ML liquid Take 30 mg 2 (two) times daily as needed by mouth for cough.  . dextromethorphan-guaiFENesin (MUCINEX DM) 30-600 MG 12hr tablet Take 1 tablet 2 (two) times daily as needed by mouth for cough.  . fenofibrate micronized  (LOFIBRA) 134 MG capsule TAKE (1) CAPSULE DAILY BEFORE BREAKFAST.  Marland Kitchen ipratropium-albuterol (DUONEB) 0.5-2.5 (3) MG/3ML SOLN Inhale 3 mLs into the lungs every 6 (six) hours as needed.  Marland Kitchen ketorolac (ACULAR) 0.5 % ophthalmic solution Place 1 drop into both eyes 4 (four) times daily.  Marland Kitchen levothyroxine (SYNTHROID) 50 MCG tablet TAKE 1 TABLET DAILY  . loteprednol (LOTEMAX) 0.5 % ophthalmic suspension Place 1 drop into both eyes 2 (two) times daily.  . meclizine (ANTIVERT) 25 MG tablet Take 25 mg every 8 (eight) hours as needed by mouth for dizziness.  . memantine (NAMENDA XR) 28 MG CP24 24 hr capsule Take 1 capsule (28 mg total) by mouth daily. (Needs to be seen beforenext refill)  . metoprolol tartrate (LOPRESSOR) 25 MG tablet TAKE (1/2) TABLET TWICE DAILY.  . montelukast (SINGULAIR) 10 MG tablet TAKE ONE TABLET AT BEDTIME  . omeprazole (PRILOSEC) 20 MG capsule Take 20 mg by mouth 2 (two) times daily before a meal.  . OXYGEN Oxygen 3lpm 24/7, Increase to 4L as needed for shortness of breath with activity  . PROAIR HFA 108 (90 Base) MCG/ACT inhaler INHALE 2 PUFFS BY MOUTH EVERY 6 HOURS AS NEEDED  . Respiratory Therapy Supplies (FLUTTER) DEVI As needed  . RHOPRESSA 0.02 % SOLN Place 1 drop into both eyes at bedtime.  . sodium chloride (OCEAN) 0.65 % SOLN nasal spray 2 puffs every 4-6 hours as needed for nasal congestion  . valsartan-hydrochlorothiazide (DIOVAN-HCT) 80-12.5 MG tablet TAKE 1 TABLET DAILY   No facility-administered encounter medications on file as of 01/12/2019.    Review of Systems  Constitutional: Negative for chills and fever.  Eyes: Negative for visual disturbance.  Respiratory: Negative for chest tightness and shortness of breath.   Cardiovascular: Negative for chest pain and leg swelling.  Musculoskeletal: Negative for back pain and gait problem.  Skin: Negative for rash.  Neurological: Negative for light-headedness and headaches.  Psychiatric/Behavioral: Positive for sleep  disturbance. Negative for agitation, behavioral problems, decreased concentration, dysphoric mood, self-injury and suicidal ideas. The patient is not nervous/anxious.   All other systems reviewed and are negative.   Observations/Objective: Patient sounds comfortable and in no acute distress  Assessment and Plan: Problem List Items Addressed This Visit      Cardiovascular and Mediastinum   HTN (hypertension)     Respiratory   COPD GOLD III/ 02 dep      Endocrine   Hypothyroidism - Primary     Nervous and Auditory   Alzheimer's disease ([place])   Relevant Medications   ALPRAZolam (XANAX) 1 MG tablet     Other   Insomnia   Relevant Medications   ALPRAZolam (XANAX) 1 MG tablet   Depression, recurrent ([facility])   Relevant Medications   ALPRAZolam (XANAX) 1 MG tablet   Anxiety   Relevant Medications   ALPRAZolam (XANAX) 1 MG tablet    Other Visit Diagnoses    Controlled substance agreement signed       Relevant Medications   ALPRAZolam (XANAX) 1 MG tablet      Continue alprazolam just in the evenings, send spacer for COPD to  see if it could help her use her inhalers correctly Follow up plan: Return in about 3 months (around 04/12/2019), or if symptoms worsen or fail to improve, for depression and anxiety and bloodwork.      I discussed the assessment and treatment plan with the patient. The patient was provided an opportunity to ask questions and all were answered. The patient agreed with the plan and demonstrated an understanding of the instructions.   The patient was advised to call back or seek an in-person evaluation if the symptoms worsen or if the condition fails to improve as anticipated.  The above assessment and management plan was discussed with the patient. The patient verbalized understanding of and has agreed to the management plan. Patient is aware to call the clinic if symptoms persist or worsen. Patient is aware when to return to the clinic for a follow-up visit.  Patient educated on when it is appropriate to go to the emergency department.    I provided 18 minutes of non-face-to-face time during this encounter.    Worthy Rancher, MD

## 2019-01-13 ENCOUNTER — Encounter (INDEPENDENT_AMBULATORY_CARE_PROVIDER_SITE_OTHER): Payer: Medicare Other | Admitting: Ophthalmology

## 2019-01-13 DIAGNOSIS — I10 Essential (primary) hypertension: Secondary | ICD-10-CM

## 2019-01-13 DIAGNOSIS — H35033 Hypertensive retinopathy, bilateral: Secondary | ICD-10-CM

## 2019-01-13 DIAGNOSIS — H353231 Exudative age-related macular degeneration, bilateral, with active choroidal neovascularization: Secondary | ICD-10-CM

## 2019-01-13 DIAGNOSIS — H43813 Vitreous degeneration, bilateral: Secondary | ICD-10-CM

## 2019-01-19 ENCOUNTER — Other Ambulatory Visit: Payer: Self-pay | Admitting: Family Medicine

## 2019-02-07 ENCOUNTER — Encounter: Payer: Self-pay | Admitting: Internal Medicine

## 2019-02-07 ENCOUNTER — Other Ambulatory Visit: Payer: Self-pay

## 2019-02-07 ENCOUNTER — Ambulatory Visit: Payer: Medicare Other | Admitting: Internal Medicine

## 2019-02-07 DIAGNOSIS — J9612 Chronic respiratory failure with hypercapnia: Secondary | ICD-10-CM

## 2019-02-07 DIAGNOSIS — J449 Chronic obstructive pulmonary disease, unspecified: Secondary | ICD-10-CM

## 2019-02-07 NOTE — Patient Instructions (Signed)
No change in medications  Adjust 02 during the day to keep sats above 92% - no need to push higher   See calendar for specific medication instructions and bring it back for each and every office visit for every healthcare provider you see.  Without it,  you may not receive the best quality medical care that we feel you deserve.  You will note that the calendar groups together  your maintenance  medications that are timed at particular times of the day.  Think of this as your checklist for what your doctor has instructed you to do until your next evaluation to see what benefit  there is  to staying on a consistent group of medications intended to keep you well.  The other group at the bottom is entirely up to you to use as you see fit  for specific symptoms that may arise between visits that require you to treat them on an as needed basis.  Think of this as your action plan or "what if" list.   Separating the top medications from the bottom group is fundamental to providing you adequate care going forward.     Please schedule a follow up visit in  6 months but call sooner if needed

## 2019-02-07 NOTE — Progress Notes (Signed)
Subjective:     Patient ID: Katie Bryant, female   DOB: 16-Nov-1928    MRN: [id number]   Brief patient profile:  70 yowf quit smoking 1992 with GOLD III copd dx 2009 eval in pulm clinic p hospitalized at [facility] 12/21/10   History of Present Illness  09/27/2013 Follow up and Med review  Hx of COPD III/ 02 dep/ has med  Patient returns for followup and medication review. We reviewed all her medications and organized them into a medication calendar with patient education. Does get easily confused with meds. Appears family is helping with meds now.  Recently dx with dementia, now on Namenda. Seems to be some improvement with memory.  rec No change rx/ follow med calendar and bring to each visit/ daughter [name]      11/04/2016  f/u ov/Anshu Wehner re:  Copd GOLD III/ ? Increased 02 req ? (not monitoring sats as req) Chief Complaint  Patient presents with  . Follow-up    Pt has leg swelling more in rt leg/foot then left. Pt has sob, wheezing with dry cough. Pt's daughter states pt is not doing well, now living with the daughter  breathing worse since late August Admit to [facility] 09/12/16 for ams "it turned out to be sob  Not adjusting 02 as rec for sats but rather "for how she feels"  Sleeping fine on side but increaesed 02 to 5lpm hs by daughter  Has med calendar but does not appear to be using Doe = MMRC3 = can't walk 100 yards even at a slow pace at a flat grade s stopping due to sob  Even on 5lpm  rec No change rx x mucinex should be mucinex dm 2 every 12 hours as needed    10/06/2018 T Parrett  OV > med calendar   02/07/2019  f/u ov/Ponce Skillman re: GOLD III spirometry but 02 dep 24/7 / following med calendar well/ living with daughter Chief Complaint  Patient presents with  . Follow-up    Breathing is about the same. She has cough when she lies down- non prod. She is using her proair only when she leaves her house- not leaving much.   Dyspnea:  Around the house = MMRC3 = can't walk 100 yards even  at a slow pace at a flat grade s stopping due to sob Cough: only hs and  Sleeping: flat/ two pillows  SABA use: rarely 02:  4lpm concentrator and 2lpm POC    No obvious day to day or daytime variability or assoc excess/ purulent sputum or mucus plugs or hemoptysis or cp or chest tightness, subjective wheeze or overt sinus or hb symptoms.   Sleeping as above  without nocturnal  or early am exacerbation  of respiratory  c/o's or need for noct saba. Also denies any obvious fluctuation of symptoms with weather or environmental changes or other aggravating or alleviating factors except as outlined above   No unusual exposure hx or h/o childhood pna/ asthma or knowledge of premature birth.  Current Allergies, Complete Past Medical History, Past Surgical History, Family History, and Social History were reviewed in [employer] record.  ROS  The following are not active complaints unless bolded Hoarseness, sore throat, dysphagia, dental problems, itching, sneezing,  nasal congestion or discharge of excess mucus or purulent secretions, ear ache,   fever, chills, sweats, unintended wt loss or wt gain, classically pleuritic or exertional cp,  orthopnea pnd or arm/hand swelling  or leg swelling, presyncope, palpitations, abdominal  pain, anorexia, nausea, vomiting, diarrhea  or change in bowel habits or change in bladder habits, change in stools or change in urine, dysuria, hematuria,  rash, arthralgias, visual complaints, headache, numbness, weakness or ataxia or problems with walking or coordination,  change in mood or  memory.        Current Meds  Medication Sig  . acetaminophen (TYLENOL) 500 MG tablet Take 500 mg by mouth every 6 (six) hours as needed (pain).   Marland Kitchen ALPRAZolam (XANAX) 1 MG tablet Take 1 tablet (1 mg total) by mouth at bedtime.  . ARIPiprazole (ABILIFY) 2 MG tablet Take 1 tablet (2 mg total) by mouth daily.  Marland Kitchen aspirin 81 MG tablet Take 81 mg by mouth daily.  Marland Kitchen  atorvastatin (LIPITOR) 40 MG tablet TAKE 1 TABLET DAILY  . betaxolol (BETOPTIC-S) 0.25 % ophthalmic suspension Place 1 drop into both eyes every morning.  . brimonidine (ALPHAGAN) 0.2 % ophthalmic solution Place 1 drop into both eyes daily.  . budesonide (PULMICORT) 0.5 MG/2ML nebulizer solution NEBULIZE 1 VIAL TWICE A DAY  . buPROPion (WELLBUTRIN SR) 200 MG 12 hr tablet TAKE (1) TABLET TWICE A DAY.  . chlorpheniramine (CHLOR-TRIMETON) 4 MG tablet Take 4 mg by mouth every 4 (four) hours as needed (drippy nose, drainage).  . Cholecalciferol (VITAMIN D) 50 MCG (2000 UT) tablet Take 2,000 Units by mouth daily.  . citalopram (CELEXA) 20 MG tablet Take 1 tablet (20 mg total) by mouth daily.  Marland Kitchen dextromethorphan (DELSYM) 30 MG/5ML liquid Take 30 mg 2 (two) times daily as needed by mouth for cough.  . dextromethorphan-guaiFENesin (MUCINEX DM) 30-600 MG 12hr tablet Take 1 tablet 2 (two) times daily as needed by mouth for cough.  . fenofibrate micronized (LOFIBRA) 134 MG capsule TAKE (1) CAPSULE DAILY BEFORE BREAKFAST.  Marland Kitchen ketorolac (ACULAR) 0.5 % ophthalmic solution Place 1 drop into both eyes 4 (four) times daily.  Marland Kitchen levothyroxine (SYNTHROID) 50 MCG tablet TAKE 1 TABLET DAILY  . loteprednol (LOTEMAX) 0.5 % ophthalmic suspension Place 1 drop into both eyes 2 (two) times daily.  . meclizine (ANTIVERT) 25 MG tablet Take 25 mg every 8 (eight) hours as needed by mouth for dizziness.  . memantine (NAMENDA XR) 28 MG CP24 24 hr capsule Take 1 capsule (28 mg total) by mouth daily. (Needs to be seen beforenext refill)  . metoprolol tartrate (LOPRESSOR) 25 MG tablet TAKE (1/2) TABLET TWICE DAILY.  . montelukast (SINGULAIR) 10 MG tablet TAKE ONE TABLET AT BEDTIME  . omeprazole (PRILOSEC) 20 MG capsule Take 20 mg by mouth 2 (two) times daily before a meal.  . OXYGEN Oxygen 3lpm 24/7, Increase to 4L as needed for shortness of breath with activity  . PROAIR HFA 108 (90 Base) MCG/ACT inhaler INHALE 2 PUFFS BY MOUTH EVERY  6 HOURS AS NEEDED  . Respiratory Therapy Supplies (FLUTTER) DEVI As needed  . RHOPRESSA 0.02 % SOLN Place 1 drop into both eyes at bedtime.  . sodium chloride (OCEAN) 0.65 % SOLN nasal spray 2 puffs every 4-6 hours as needed for nasal congestion  . valsartan-hydrochlorothiazide (DIOVAN-HCT) 80-12.5 MG tablet TAKE 1 TABLET DAILY                Objective:  Physical Exam    walking with rollator   Vital signs reviewed  02/07/2019  - Note at rest 02 sats  93% on 2 POC    02/07/2019     133   07/06/2018  141   10/05/2017   149  03/19/2017   146 >   Wt 184 01/21/2011  >08/27/2011 186 > 180 09/29/2011 > 10/07/2011  183 > 11/11/2011  181 > 01/13/2012  183 >180 02/12/2012 > 06/09/2012 188 > 189 10/12/2012 >188 01/14/2013 > 01/21/2013 187 > 03/02/2013 >184 03/18/2013 >  03/28/2013 186> 06/06/2013  182 >  07/18/2013  179 >181 09/27/2013 > 12/08/2013   183 > 02/24/2014  178 > 06/09/2014 177 > 09/07/2014 175 > 12/05/2014   173 > 03/06/2015 173>  10/16/2015   171 >  04/28/2016  167 > 11/04/2016  155 >     HEENT : pt wearing mask not removed for exam due to covid -19 concerns.    NECK :  without JVD/Nodes/TM/ nl carotid upstrokes bilaterally   LUNGS: no acc muscle use,  Mod barrel  contour chest wall with bilateral  Distant bs s audible wheeze and  without cough on insp or exp maneuvers and mod  Hyperresonant  to  percussion bilaterally     CV:  RRR  no s3 or murmur or increase in P2, and no edema   ABD:  soft and nontender with pos mid insp Hoover's  in the supine position. No bruits or organomegaly appreciated, bowel sounds nl  MS:     ext warm without deformities, calf tenderness, cyanosis or clubbing No obvious joint restrictions   SKIN: warm and dry without lesions    NEURO:  alert, approp, nl sensorium with  no motor or cerebellar deficits apparent.

## 2019-02-10 ENCOUNTER — Encounter: Payer: Self-pay | Admitting: Internal Medicine

## 2019-02-10 NOTE — Assessment & Plan Note (Signed)
Quit smoking 1992    - PFT's 06/11/2007  FEV1   0.70 (34%) ratio 32 with 49% improvement p B2    - HFA 75% effective p coaching 06/20/2011 > 50% 06/24/2011  -Med calendar 09/27/2013 > did not bring as requested 06/09/14 , 06/05/2015 redone  - improved reconciliation 09/07/2014 and 03/07/2015 and 10/16/2015   - 10/05/2017  After extensive coaching inhaler device,  effectiveness =    50%   Severe copd but well compensated on present rx    Each maintenance medication was reviewed in detail including most importantly the difference between maintenance and as needed and under what circumstances the prns are to be used. This was done in the context of a medication calendar review which provided the patient with a user-friendly unambiguous mechanism for medication administration and reconciliation and provides an action plan for all active problems. It is critical that this be shown to every doctor  for modification during the office visit if necessary so the patient can use it as a working document.

## 2019-02-10 NOTE — Assessment & Plan Note (Signed)
Dr Annamaria Boots started 02 around 2010    - 3 lpm 24/7 except 4lpm with activity as of 03/02/2013     - HC03 trending in low 30's since 2012 so likely hypercarbic component as well   - 02/24/2014   Walked RA x one lap @ 185 stopped due to  desat to 83% corrected on 4lpm    - 12/05/2014   Walked 3lpm pulsed x 2 laps @ 185 ft each stopped due to sob with sat 89% at moderate pace    - 03/06/2015   Walked 4lpm 2 laps @ 185 ft each stopped due to  Sob/ desat to 86% at pace faster than her nl  - 11/04/2016  HCO3  =32  -  12/08/2016  Apri notified "does not qualify for POC "  - not clear whether titration attempted.     rx as of 02/07/2019  = 4lpm 24/7  And titrate to keep > 90% with activity  (no higher needed)          Each maintenance medication was reviewed in detail including emphasizing most importantly the difference between maintenance and prns and under what circumstances the prns are to be triggered using an action plan format where appropriate.  Total time for H and P, chart review, counseling,  and generating customized AVS unique to this office visit / charting = 20 min

## 2019-02-14 DIAGNOSIS — J449 Chronic obstructive pulmonary disease, unspecified: Secondary | ICD-10-CM | POA: Diagnosis not present

## 2019-03-14 DIAGNOSIS — H02132 Senile ectropion of right lower eyelid: Secondary | ICD-10-CM | POA: Diagnosis not present

## 2019-03-14 DIAGNOSIS — J449 Chronic obstructive pulmonary disease, unspecified: Secondary | ICD-10-CM | POA: Diagnosis not present

## 2019-03-14 DIAGNOSIS — H02135 Senile ectropion of left lower eyelid: Secondary | ICD-10-CM | POA: Diagnosis not present

## 2019-03-14 DIAGNOSIS — H40013 Open angle with borderline findings, low risk, bilateral: Secondary | ICD-10-CM | POA: Diagnosis not present

## 2019-03-24 ENCOUNTER — Encounter (INDEPENDENT_AMBULATORY_CARE_PROVIDER_SITE_OTHER): Payer: Medicare Other | Admitting: Ophthalmology

## 2019-03-26 ENCOUNTER — Other Ambulatory Visit: Payer: Self-pay | Admitting: Cardiology

## 2019-03-26 ENCOUNTER — Other Ambulatory Visit: Payer: Self-pay | Admitting: Family Medicine

## 2019-03-26 IMAGING — DX DG CHEST 2V
2 series · 2 of 2 positions shown · non-contrast
Comparison: January 22, 2017

CLINICAL DATA: Cough and chest pain

EXAM:
CHEST - 2 VIEW

[chest pa]
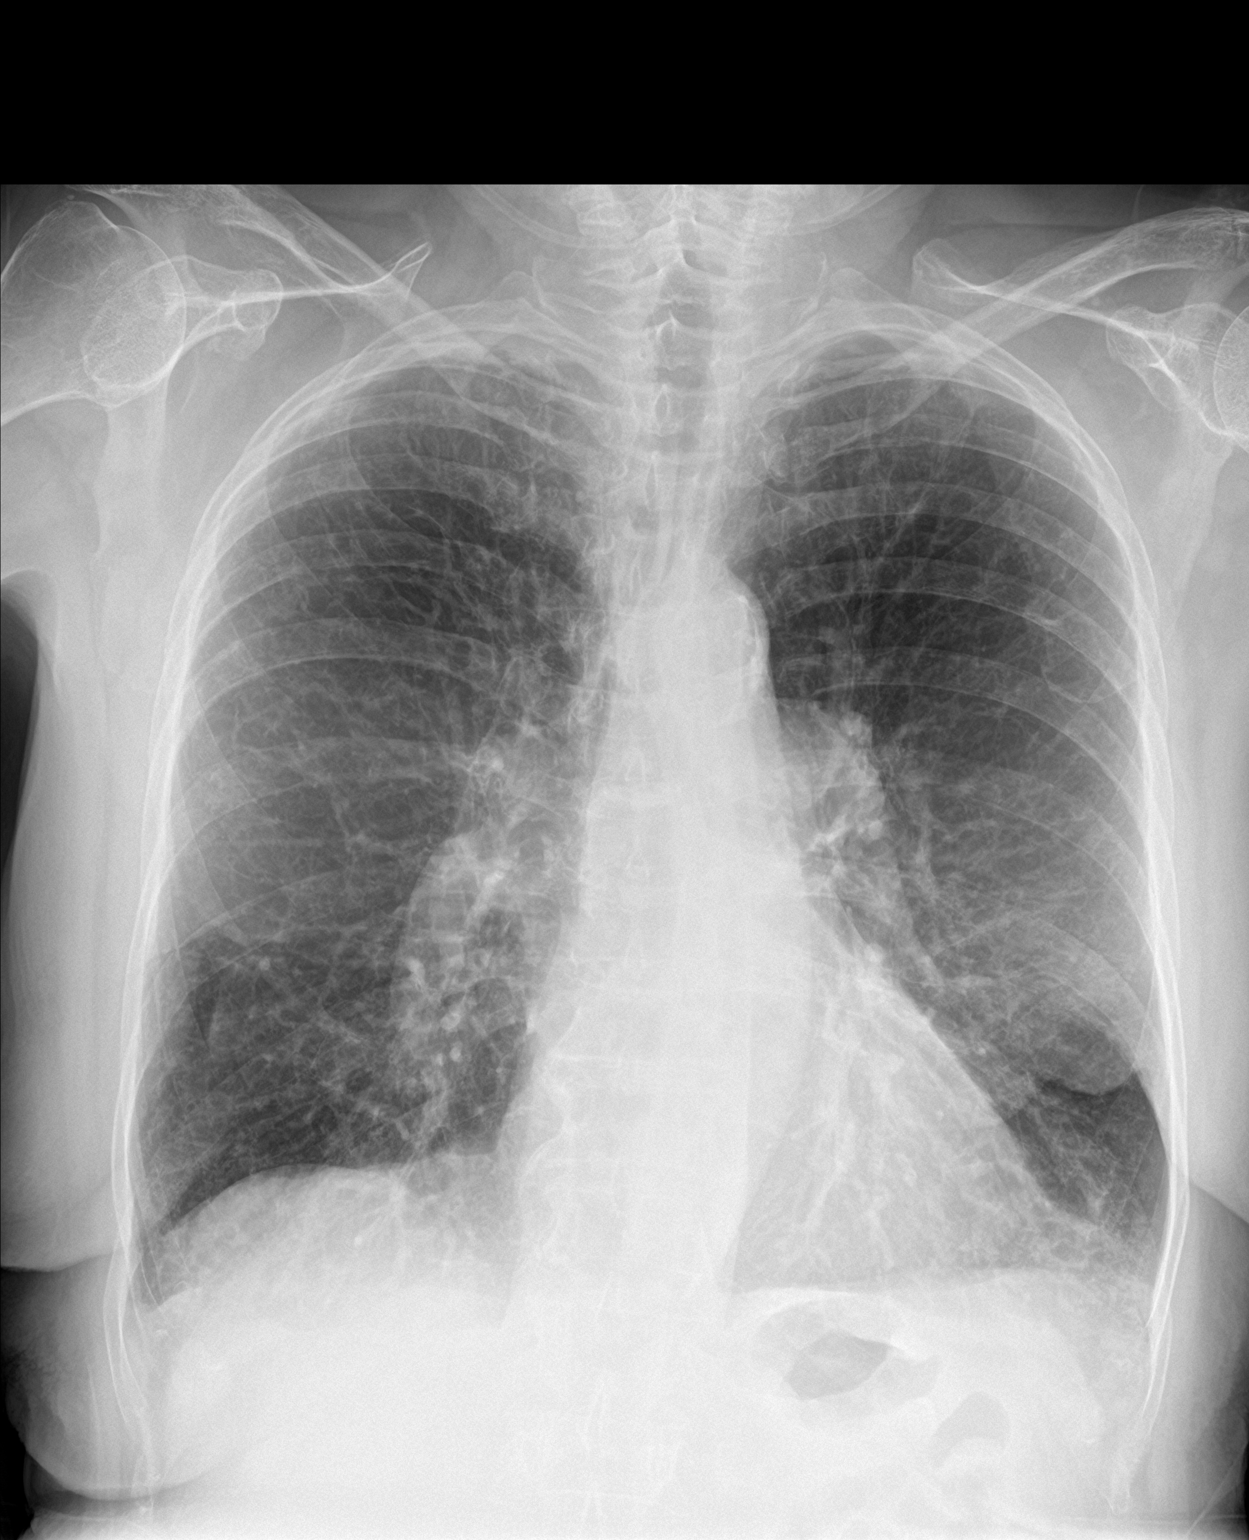

[chest lat]
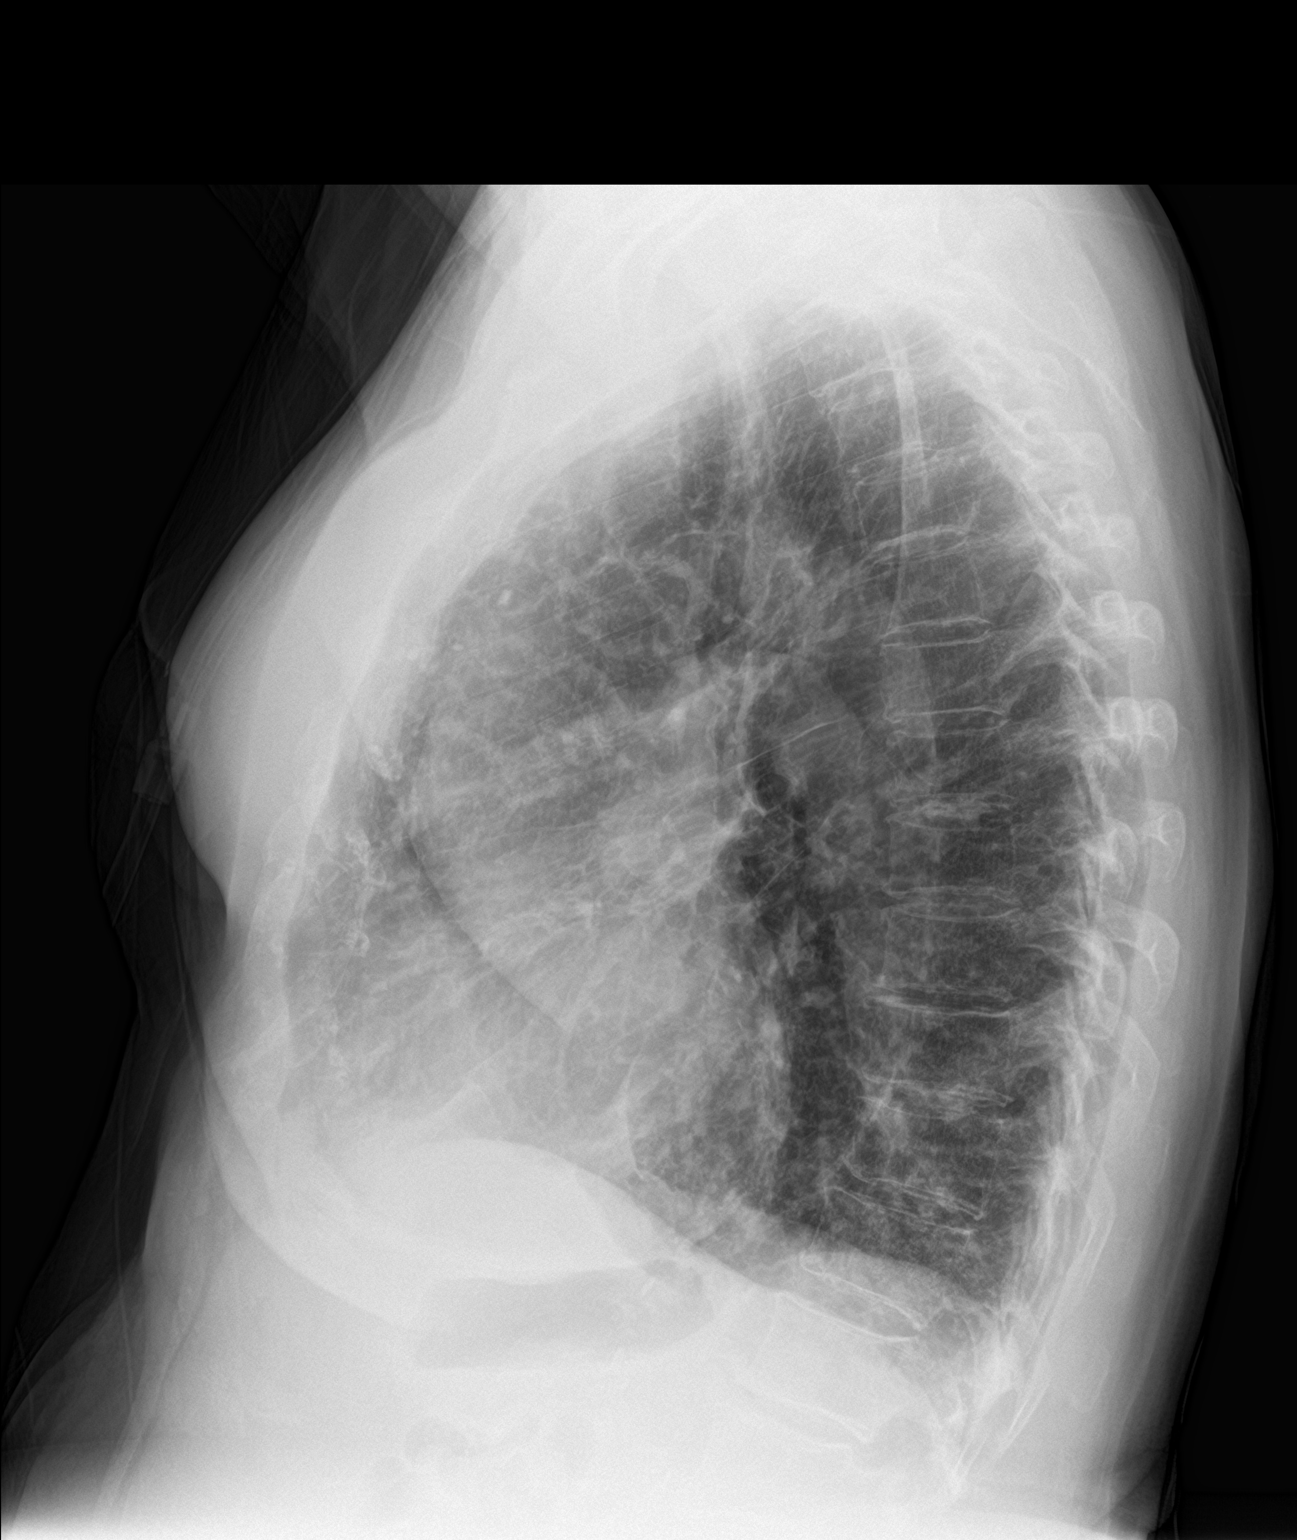

[2 of 2 positions shown; findings below may reference images not displayed]

FINDINGS: There is bibasilar fibrotic change. Elsewhere, there is mild
interstitial prominence with scattered areas of scarring. No edema
or consolidation. Heart size is normal. There is central pulmonary
arterial prominence with rapid peripheral tapering suggesting a
degree of pulmonary arterial hypertension. No adenopathy. There is
aortic atherosclerosis. No bone lesions.
IMPRESSION: Bibasilar fibrosis. Scattered areas of scarring and generalized
interstitial prominence. No frank edema or consolidation.

Suspect a degree of pulmonary arterial hypertension.

There is aortic atherosclerosis.

Aortic Atherosclerosis (N73FD-Q02.2).

## 2019-03-29 ENCOUNTER — Telehealth: Payer: Self-pay | Admitting: Family Medicine

## 2019-03-29 NOTE — Chronic Care Management (AMB) (Signed)
  Chronic Care Management   Note  03/29/2019 Name: Katie Bryant MRN: [id number] DOB: September 10, 1928  Katie Bryant is a 86 y.o. year old female who is a primary care patient of Dettinger, Fransisca Kaufmann, MD. I reached out to Edwina Barth by phone today in response to a referral sent by Katie Bryant's health plan.     Katie Bryant was given information about Chronic Care Management services today including:  1. CCM service includes personalized support from designated clinical staff supervised by her physician, including individualized plan of care and coordination with other care providers 2. 24/7 contact phone numbers for assistance for urgent and routine care needs. 3. Service will only be billed when office clinical staff spend 20 minutes or more in a month to coordinate care. 4. Only one practitioner may furnish and bill the service in a calendar month. 5. The patient may stop CCM services at any time (effective at the end of the month) by phone call to the office staff. 6. The patient will be responsible for cost sharing (co-pay) of up to 20% of the service fee (after annual deductible is met).  Patient agreed to services and verbal consent obtained.   Follow up plan: Telephone appointment with care management team member scheduled for:09/20/2019  Noreene Larsson, [address], [place], [place] [address] Direct Dial: [phone] [email or web address] Website: [email or web address]

## 2019-04-05 ENCOUNTER — Other Ambulatory Visit: Payer: Self-pay | Admitting: Internal Medicine

## 2019-04-09 ENCOUNTER — Other Ambulatory Visit: Payer: Self-pay | Admitting: Family Medicine

## 2019-04-09 DIAGNOSIS — F5101 Primary insomnia: Secondary | ICD-10-CM

## 2019-04-09 DIAGNOSIS — F419 Anxiety disorder, unspecified: Secondary | ICD-10-CM

## 2019-04-09 DIAGNOSIS — F339 Major depressive disorder, recurrent, unspecified: Secondary | ICD-10-CM

## 2019-04-09 DIAGNOSIS — Z79899 Other long term (current) drug therapy: Secondary | ICD-10-CM

## 2019-04-11 ENCOUNTER — Encounter (INDEPENDENT_AMBULATORY_CARE_PROVIDER_SITE_OTHER): Payer: Medicare Other | Admitting: Ophthalmology

## 2019-04-11 DIAGNOSIS — H43813 Vitreous degeneration, bilateral: Secondary | ICD-10-CM

## 2019-04-11 DIAGNOSIS — H35033 Hypertensive retinopathy, bilateral: Secondary | ICD-10-CM

## 2019-04-11 DIAGNOSIS — I10 Essential (primary) hypertension: Secondary | ICD-10-CM | POA: Diagnosis not present

## 2019-04-11 DIAGNOSIS — H353231 Exudative age-related macular degeneration, bilateral, with active choroidal neovascularization: Secondary | ICD-10-CM | POA: Diagnosis not present

## 2019-04-13 ENCOUNTER — Encounter: Payer: Self-pay | Admitting: Family Medicine

## 2019-04-13 ENCOUNTER — Ambulatory Visit (INDEPENDENT_AMBULATORY_CARE_PROVIDER_SITE_OTHER): Payer: Medicare Other | Admitting: Family Medicine

## 2019-04-13 DIAGNOSIS — F5101 Primary insomnia: Secondary | ICD-10-CM | POA: Diagnosis not present

## 2019-04-13 DIAGNOSIS — F419 Anxiety disorder, unspecified: Secondary | ICD-10-CM | POA: Diagnosis not present

## 2019-04-13 DIAGNOSIS — F339 Major depressive disorder, recurrent, unspecified: Secondary | ICD-10-CM | POA: Diagnosis not present

## 2019-04-13 DIAGNOSIS — Z79899 Other long term (current) drug therapy: Secondary | ICD-10-CM

## 2019-04-13 MED ORDER — TEMAZEPAM 15 MG PO CAPS: 15.0000 mg | ORAL_CAPSULE | Freq: Every evening | ORAL | 2 refills | Status: DC | PRN

## 2019-04-13 NOTE — Progress Notes (Signed)
Virtual Visit via telephone Note  I connected with Katie Bryant on 04/13/19 at 1431 by telephone and verified that I am speaking with the correct person using two identifiers. Katie Bryant is currently located at home and daughter are currently with her during visit. The provider, Fransisca Kaufmann Rim Thatch, MD is located in their office at time of visit.  Call ended at 1441  I discussed the limitations, risks, security and privacy concerns of performing an evaluation and management service by telephone and the availability of in person appointments. I also discussed with the patient that there may be a patient responsible charge related to this service. The patient expressed understanding and agreed to proceed.   History and Present Illness: Insomnia and anxiety Current rx- xanax, will switch to temazepam 15 mg qhs prn # meds rx- 30 Effectiveness of current meds-works well and takes abilify Adverse reactions form meds-none  Pill count performed-No Last drug screen - 07/13/18 ( high risk q83m, moderate risk q22m, low risk yearly ) Urine drug screen today- No Was the [facility] reviewed- yes  If yes were their any concerning findings? - none  No flowsheet data found.   Controlled substance contract signed on: 07/13/18   1. Anxiety   2. Depression, recurrent ([place])   3. Primary insomnia   4. Controlled substance agreement signed     Outpatient Encounter Medications as of 04/13/2019  Medication Sig  . acetaminophen (TYLENOL) 500 MG tablet Take 500 mg by mouth every 6 (six) hours as needed (pain).   . ARIPiprazole (ABILIFY) 2 MG tablet Take 1 tablet (2 mg total) by mouth daily.  Marland Kitchen aspirin 81 MG tablet Take 81 mg by mouth daily.  Marland Kitchen atorvastatin (LIPITOR) 40 MG tablet TAKE 1 TABLET DAILY  . betaxolol (BETOPTIC-S) 0.25 % ophthalmic suspension Place 1 drop into both eyes every morning.  . brimonidine (ALPHAGAN) 0.2 % ophthalmic solution Place 1 drop into both eyes daily.  . budesonide  (PULMICORT) 0.5 MG/2ML nebulizer solution NEBULIZE 1 VIAL TWICE A DAY  . buPROPion (WELLBUTRIN SR) 200 MG 12 hr tablet TAKE (1) TABLET TWICE A DAY.  . chlorpheniramine (CHLOR-TRIMETON) 4 MG tablet Take 4 mg by mouth every 4 (four) hours as needed (drippy nose, drainage).  . Cholecalciferol (VITAMIN D) 50 MCG (2000 UT) tablet Take 2,000 Units by mouth daily.  . citalopram (CELEXA) 20 MG tablet Take 1 tablet (20 mg total) by mouth daily.  Marland Kitchen dextromethorphan (DELSYM) 30 MG/5ML liquid Take 30 mg 2 (two) times daily as needed by mouth for cough.  . dextromethorphan-guaiFENesin (MUCINEX DM) 30-600 MG 12hr tablet Take 1 tablet 2 (two) times daily as needed by mouth for cough.  . fenofibrate micronized (LOFIBRA) 134 MG capsule TAKE (1) CAPSULE DAILY BEFORE BREAKFAST.  Marland Kitchen ketorolac (ACULAR) 0.5 % ophthalmic solution Place 1 drop into both eyes 4 (four) times daily.  Marland Kitchen levothyroxine (SYNTHROID) 50 MCG tablet TAKE 1 TABLET DAILY  . loteprednol (LOTEMAX) 0.5 % ophthalmic suspension Place 1 drop into both eyes 2 (two) times daily.  . meclizine (ANTIVERT) 25 MG tablet Take 25 mg every 8 (eight) hours as needed by mouth for dizziness.  . memantine (NAMENDA XR) 28 MG CP24 24 hr capsule Take 1 capsule by mouth daily. (Needs to be seen before next refill)  . metoprolol tartrate (LOPRESSOR) 25 MG tablet TAKE (1/2) TABLET TWICE DAILY.  . montelukast (SINGULAIR) 10 MG tablet TAKE ONE TABLET AT BEDTIME  . omeprazole (PRILOSEC) 20 MG capsule Take 20 mg  by mouth 2 (two) times daily before a meal.  . OXYGEN Oxygen 3lpm 24/7, Increase to 4L as needed for shortness of breath with activity  . PROAIR HFA 108 (90 Base) MCG/ACT inhaler INHALE 2 PUFFS BY MOUTH EVERY 6 HOURS AS NEEDED  . Respiratory Therapy Supplies (FLUTTER) DEVI As needed  . RHOPRESSA 0.02 % SOLN Place 1 drop into both eyes at bedtime.  . sodium chloride (OCEAN) 0.65 % SOLN nasal spray 2 puffs every 4-6 hours as needed for nasal congestion  .  valsartan-hydrochlorothiazide (DIOVAN-HCT) 80-12.5 MG tablet TAKE 1 TABLET DAILY  . [DISCONTINUED] ALPRAZolam (XANAX) 1 MG tablet Take 1 tablet (1 mg total) by mouth at bedtime.   No facility-administered encounter medications on file as of 04/13/2019.    Review of Systems  Constitutional: Negative for chills and fever.  Eyes: Negative for visual disturbance.  Respiratory: Negative for chest tightness and shortness of breath.   Cardiovascular: Negative for chest pain and leg swelling.  Musculoskeletal: Negative for back pain and gait problem.  Skin: Negative for rash.  Neurological: Negative for light-headedness and headaches.  Psychiatric/Behavioral: Positive for dysphoric mood and sleep disturbance. Negative for agitation, behavioral problems, self-injury and suicidal ideas. The patient is nervous/anxious.   All other systems reviewed and are negative.   Observations/Objective: Patient sounds comfortable and in no acute distress  Assessment and Plan: Problem List Items Addressed This Visit      Other   Insomnia   Depression, recurrent ([place])   Anxiety    Other Visit Diagnoses    Controlled substance agreement signed          Switched from alprazolam to temazepam for safety because it goes outside the liver on metabolism and safety for the elderly. Follow up plan: Return in about 3 months (around 07/13/2019), or if symptoms worsen or fail to improve, for Anxiety and insomnia recheck and blood work.     I discussed the assessment and treatment plan with the patient. The patient was provided an opportunity to ask questions and all were answered. The patient agreed with the plan and demonstrated an understanding of the instructions.   The patient was advised to call back or seek an in-person evaluation if the symptoms worsen or if the condition fails to improve as anticipated.  The above assessment and management plan was discussed with the patient. The patient verbalized  understanding of and has agreed to the management plan. Patient is aware to call the clinic if symptoms persist or worsen. Patient is aware when to return to the clinic for a follow-up visit. Patient educated on when it is appropriate to go to the emergency department.    I provided 10 minutes of non-face-to-face time during this encounter.    Worthy Rancher, MD

## 2019-04-14 DIAGNOSIS — J449 Chronic obstructive pulmonary disease, unspecified: Secondary | ICD-10-CM | POA: Diagnosis not present

## 2019-04-20 ENCOUNTER — Other Ambulatory Visit: Payer: Medicare Other

## 2019-04-20 ENCOUNTER — Telehealth: Payer: Self-pay | Admitting: Family Medicine

## 2019-04-20 ENCOUNTER — Other Ambulatory Visit: Payer: Self-pay | Admitting: Family Medicine

## 2019-04-20 ENCOUNTER — Other Ambulatory Visit: Payer: Self-pay | Admitting: *Deleted

## 2019-04-20 ENCOUNTER — Other Ambulatory Visit: Payer: Self-pay

## 2019-04-20 DIAGNOSIS — R399 Unspecified symptoms and signs involving the genitourinary system: Secondary | ICD-10-CM

## 2019-04-20 LAB — MICROSCOPIC EXAMINATION

## 2019-04-20 LAB — URINALYSIS, COMPLETE
Bilirubin, UA: NEGATIVE
Glucose, UA: NEGATIVE
Ketones, UA: NEGATIVE
Nitrite, UA: NEGATIVE
Specific Gravity, UA: 1.02 (ref 1.005–1.030)
Urobilinogen, Ur: 4 mg/dL — ABNORMAL HIGH (ref 0.2–1.0)
pH, UA: 6 (ref 5.0–7.5)

## 2019-04-20 MED ORDER — CEPHALEXIN 500 MG PO CAPS: 500.0000 mg | ORAL_CAPSULE | Freq: Four times a day (QID) | ORAL | 0 refills | Status: DC

## 2019-04-20 NOTE — Progress Notes (Signed)
Sent in Keflex  for confusion and urinary symptoms

## 2019-04-20 NOTE — Telephone Encounter (Signed)
Daughter aware to bring in urine specimen.

## 2019-04-26 LAB — URINE CULTURE

## 2019-04-27 ENCOUNTER — Other Ambulatory Visit: Payer: Self-pay | Admitting: Family Medicine

## 2019-04-27 MED ORDER — SULFAMETHOXAZOLE-TRIMETHOPRIM 800-160 MG PO TABS: 1.0000 | ORAL_TABLET | Freq: Two times a day (BID) | ORAL | 0 refills | Status: DC

## 2019-05-02 ENCOUNTER — Other Ambulatory Visit: Payer: Self-pay | Admitting: Family Medicine

## 2019-05-04 ENCOUNTER — Telehealth: Payer: Self-pay | Admitting: *Deleted

## 2019-05-04 MED ORDER — IPRATROPIUM-ALBUTEROL 0.5-2.5 (3) MG/3ML IN SOLN: 3.0000 mL | Freq: Four times a day (QID) | RESPIRATORY_TRACT | 1 refills | Status: DC | PRN

## 2019-05-04 NOTE — Addendum Note (Signed)
Addended by: Caryl Pina on: 05/04/2019 12:31 PM   Modules accepted: Orders

## 2019-05-04 NOTE — Telephone Encounter (Signed)
Fax from [facility] on Duoneb not on current med list Please advise

## 2019-05-04 NOTE — Telephone Encounter (Signed)
I sent the DuoNeb prescription for the patient.

## 2019-05-14 DIAGNOSIS — J449 Chronic obstructive pulmonary disease, unspecified: Secondary | ICD-10-CM | POA: Diagnosis not present

## 2019-06-07 ENCOUNTER — Other Ambulatory Visit: Payer: Self-pay | Admitting: Internal Medicine

## 2019-06-14 DIAGNOSIS — J449 Chronic obstructive pulmonary disease, unspecified: Secondary | ICD-10-CM | POA: Diagnosis not present

## 2019-06-16 ENCOUNTER — Telehealth: Payer: Self-pay | Admitting: Family Medicine

## 2019-06-16 DIAGNOSIS — R399 Unspecified symptoms and signs involving the genitourinary system: Secondary | ICD-10-CM

## 2019-06-16 NOTE — Telephone Encounter (Signed)
Bring in  specimen or will antibiotic be ordered?

## 2019-06-16 NOTE — Telephone Encounter (Signed)
  Prescription Request  06/16/2019  What is the name of the medication or equipment? Antibiotic for UTI  Have you contacted your pharmacy to request a refill? (if applicable) No  Which pharmacy would you like this sent to? [facility]  Pts daughter called to let us know that she believes pt has UTI again. Leukolytes was positive with home test and pt is having same symptoms as last time (back in April). Requesting that Dr Dettinger send in refill for UTI Rx.   Patient notified that their request is being sent to the clinical staff for review and that they should receive a response within 2 business days.

## 2019-06-16 NOTE — Telephone Encounter (Signed)
Patient has a follow up appointment scheduled. 

## 2019-06-16 NOTE — Addendum Note (Signed)
Addended by: Alphonzo Dublin on: 06/16/2019 04:30 PM   Modules accepted: Orders

## 2019-06-16 NOTE — Telephone Encounter (Signed)
Pt has been scheduled with Britney during the after hours clinic 06/17/2019 tomorrow afternoon. Daughter made aware of telephone call. She will bring in urine sample in the am. Future orders placed for UA and culture.

## 2019-06-16 NOTE — Telephone Encounter (Signed)
Please schedule for visit, can be virtual if needed

## 2019-06-17 ENCOUNTER — Other Ambulatory Visit: Payer: Self-pay

## 2019-06-17 ENCOUNTER — Ambulatory Visit (INDEPENDENT_AMBULATORY_CARE_PROVIDER_SITE_OTHER): Payer: Medicare Other | Admitting: Family Medicine

## 2019-06-17 ENCOUNTER — Other Ambulatory Visit: Payer: Medicare Other

## 2019-06-17 ENCOUNTER — Encounter: Payer: Self-pay | Admitting: Family Medicine

## 2019-06-17 DIAGNOSIS — R399 Unspecified symptoms and signs involving the genitourinary system: Secondary | ICD-10-CM

## 2019-06-17 LAB — URINALYSIS, COMPLETE
Bilirubin, UA: NEGATIVE
Glucose, UA: NEGATIVE
Ketones, UA: NEGATIVE
Leukocytes,UA: NEGATIVE
Nitrite, UA: NEGATIVE
Protein,UA: NEGATIVE
Specific Gravity, UA: 1.02 (ref 1.005–1.030)
Urobilinogen, Ur: 1 mg/dL (ref 0.2–1.0)
pH, UA: 7.5 (ref 5.0–7.5)

## 2019-06-17 LAB — MICROSCOPIC EXAMINATION: Renal Epithel, UA: NONE SEEN /hpf

## 2019-06-17 NOTE — Progress Notes (Signed)
Virtual Visit via Telephone Note  I connected with Katie Bryant's daughter on 06/17/19 at 5:01 PM by telephone and verified that I am speaking with the correct person using two identifiers. Katie Bryant is currently located at home and her daughter is currently with her during this visit. The provider, Loman Brooklyn, FNP is located in their office at time of visit.  I discussed the limitations, risks, security and privacy concerns of performing an evaluation and management service by telephone and the availability of in person appointments. I also discussed with the patient that there may be a patient responsible charge related to this service. The patient expressed understanding and agreed to proceed.  Subjective: PCP: Dettinger, Fransisca Kaufmann, MD  Chief Complaint  Patient presents with  . Urinary Frequency   Urinary Tract Infection: Patient complains of frequency She has had symptoms for 2 days. Patient also complains of confusion. Patient denies dysuria, fever, and abdominal pain. Patient does have a history of recurrent UTI.  Patient does not have a history of pyelonephritis.    ROS: Per HPI  Current Outpatient Medications:  .  acetaminophen (TYLENOL) 500 MG tablet, Take 500 mg by mouth every 6 (six) hours as needed (pain). , Disp: , Rfl:  .  ARIPiprazole (ABILIFY) 2 MG tablet, Take 1 tablet (2 mg total) by mouth daily., Disp: 90 tablet, Rfl: 3 .  aspirin 81 MG tablet, Take 81 mg by mouth daily., Disp: , Rfl:  .  atorvastatin (LIPITOR) 40 MG tablet, TAKE 1 TABLET DAILY, Disp: 90 tablet, Rfl: 2 .  betaxolol (BETOPTIC-S) 0.25 % ophthalmic suspension, Place 1 drop into both eyes every morning., Disp: , Rfl:  .  brimonidine (ALPHAGAN) 0.2 % ophthalmic solution, Place 1 drop into both eyes daily., Disp: , Rfl:  .  budesonide (PULMICORT) 0.5 MG/2ML nebulizer solution, NEBULIZE 1 VIAL TWICE A DAY, Disp: 120 mL, Rfl: 12 .  buPROPion (WELLBUTRIN SR) 200 MG 12 hr tablet, TAKE (1) TABLET  TWICE A DAY., Disp: 60 tablet, Rfl: 2 .  chlorpheniramine (CHLOR-TRIMETON) 4 MG tablet, Take 4 mg by mouth every 4 (four) hours as needed (drippy nose, drainage)., Disp: , Rfl:  .  Cholecalciferol (VITAMIN D) 50 MCG (2000 UT) tablet, Take 2,000 Units by mouth daily., Disp: , Rfl:  .  citalopram (CELEXA) 20 MG tablet, Take 1 tablet (20 mg total) by mouth daily., Disp: 90 tablet, Rfl: 3 .  dextromethorphan (DELSYM) 30 MG/5ML liquid, Take 30 mg 2 (two) times daily as needed by mouth for cough., Disp: , Rfl:  .  dextromethorphan-guaiFENesin (MUCINEX DM) 30-600 MG 12hr tablet, Take 1 tablet 2 (two) times daily as needed by mouth for cough., Disp: , Rfl:  .  fenofibrate micronized (LOFIBRA) 134 MG capsule, TAKE (1) CAPSULE DAILY BEFORE BREAKFAST., Disp: 90 capsule, Rfl: 2 .  ipratropium-albuterol (DUONEB) 0.5-2.5 (3) MG/3ML SOLN, Take 3 mLs by nebulization every 6 (six) hours as needed., Disp: 360 mL, Rfl: 1 .  ketorolac (ACULAR) 0.5 % ophthalmic solution, Place 1 drop into both eyes 4 (four) times daily., Disp: , Rfl:  .  levothyroxine (SYNTHROID) 50 MCG tablet, TAKE 1 TABLET DAILY, Disp: 30 tablet, Rfl: 11 .  loteprednol (LOTEMAX) 0.5 % ophthalmic suspension, Place 1 drop into both eyes 2 (two) times daily., Disp: , Rfl:  .  meclizine (ANTIVERT) 25 MG tablet, Take 25 mg every 8 (eight) hours as needed by mouth for dizziness., Disp: , Rfl:  .  memantine (NAMENDA XR) 28 MG  CP24 24 hr capsule, Take 1 capsule by mouth daily. (Needs to be seen before next refill), Disp: 90 capsule, Rfl: 0 .  metoprolol tartrate (LOPRESSOR) 25 MG tablet, TAKE (1/2) TABLET TWICE DAILY., Disp: 90 tablet, Rfl: 3 .  montelukast (SINGULAIR) 10 MG tablet, TAKE ONE TABLET AT BEDTIME, Disp: 30 tablet, Rfl: 5 .  omeprazole (PRILOSEC) 20 MG capsule, TAKE  (1)  CAPSULE  TWICE DAILY (TAKE ON AN EMPTY STOMACH AT LEAST 30 MIN- UTES BEFORE MEALS)., Disp: 60 capsule, Rfl: 2 .  OXYGEN, Oxygen 3lpm 24/7, Increase to 4L as needed for shortness  of breath with activity, Disp: , Rfl:  .  PROAIR HFA 108 (90 Base) MCG/ACT inhaler, INHALE 2 PUFFS BY MOUTH EVERY 6 HOURS AS NEEDED, Disp: 8.5 g, Rfl: 5 .  Respiratory Therapy Supplies (FLUTTER) DEVI, As needed, Disp: , Rfl:  .  RHOPRESSA 0.02 % SOLN, Place 1 drop into both eyes at bedtime., Disp: , Rfl:  .  sodium chloride (OCEAN) 0.65 % SOLN nasal spray, 2 puffs every 4-6 hours as needed for nasal congestion, Disp: , Rfl:  .  temazepam (RESTORIL) 15 MG capsule, Take 1 capsule (15 mg total) by mouth at bedtime as needed for sleep., Disp: 30 capsule, Rfl: 2 .  valsartan-hydrochlorothiazide (DIOVAN-HCT) 80-12.5 MG tablet, TAKE 1 TABLET DAILY, Disp: 90 tablet, Rfl: 0  No Known Allergies Past Medical History:  Diagnosis Date  . Alzheimer's disease ([place]) 03/29/2015  . Anxiety   . Aortic stenosis, mild   . Arthritis   . Asthma   . Back pain   . Breast cancer ([place])   . Carotid artery disease ([place]) 01/01/2017  . COPD (chronic obstructive pulmonary disease) ([place])   . Dementia   . Depression   . GERD (gastroesophageal reflux disease)   . H/O hiatal hernia   . Hypertension   . Hypothyroidism 06/25/2016  . Lymphadenopathy 10/07/2012  . Obesity   . Oxygen dependent   . Pneumonia   . Right hamstring muscle strain 04/26/2013  . Sleep apnea     Observations/Objective: Unable to assess patient as I was speaking with her daughter.  Assessment and Plan: 1. UTI symptoms - Will wait for urine culture to determine if patient requires antibiotic.  - Urine Culture - Urinalysis, Complete: Urine dipstick shows positive for RBC's.  Micro exam: 0-5 WBC's per HPF, 3-10 RBC's per HPF and few bacteria.   Follow Up Instructions:  I discussed the assessment and treatment plan with the patient. The patient was provided an opportunity to ask questions and all were answered. The patient agreed with the plan and demonstrated an understanding of the instructions.   The patient was advised to call back or seek  an in-person evaluation if the symptoms worsen or if the condition fails to improve as anticipated.  The above assessment and management plan was discussed with the patient. The patient verbalized understanding of and has agreed to the management plan. Patient is aware to call the clinic if symptoms persist or worsen. Patient is aware when to return to the clinic for a follow-up visit. Patient educated on when it is appropriate to go to the emergency department.   Time call ended: 5:10 PM  I provided 11 minutes of non-face-to-face time during this encounter.  Hendricks Limes, MSN, APRN, FNP-[facility] 06/17/19

## 2019-06-18 LAB — URINE CULTURE: Organism ID, Bacteria: NO GROWTH

## 2019-06-20 ENCOUNTER — Encounter (INDEPENDENT_AMBULATORY_CARE_PROVIDER_SITE_OTHER): Payer: Medicare Other | Admitting: Ophthalmology

## 2019-06-20 ENCOUNTER — Other Ambulatory Visit: Payer: Self-pay

## 2019-06-20 DIAGNOSIS — I10 Essential (primary) hypertension: Secondary | ICD-10-CM

## 2019-06-20 DIAGNOSIS — H353231 Exudative age-related macular degeneration, bilateral, with active choroidal neovascularization: Secondary | ICD-10-CM

## 2019-06-20 DIAGNOSIS — H43813 Vitreous degeneration, bilateral: Secondary | ICD-10-CM | POA: Diagnosis not present

## 2019-06-20 DIAGNOSIS — H35033 Hypertensive retinopathy, bilateral: Secondary | ICD-10-CM

## 2019-06-24 ENCOUNTER — Other Ambulatory Visit: Payer: Self-pay | Admitting: Family Medicine

## 2019-07-01 ENCOUNTER — Other Ambulatory Visit: Payer: Self-pay | Admitting: Family Medicine

## 2019-07-01 DIAGNOSIS — Z79899 Other long term (current) drug therapy: Secondary | ICD-10-CM

## 2019-07-01 DIAGNOSIS — F419 Anxiety disorder, unspecified: Secondary | ICD-10-CM

## 2019-07-01 DIAGNOSIS — F5101 Primary insomnia: Secondary | ICD-10-CM

## 2019-07-01 DIAGNOSIS — F339 Major depressive disorder, recurrent, unspecified: Secondary | ICD-10-CM

## 2019-07-04 ENCOUNTER — Telehealth: Payer: Self-pay | Admitting: Internal Medicine

## 2019-07-04 DIAGNOSIS — J449 Chronic obstructive pulmonary disease, unspecified: Secondary | ICD-10-CM

## 2019-07-04 NOTE — Telephone Encounter (Signed)
Dr. [name], Please advise if it is ok to order a flutter valve for patient.  Thank you.

## 2019-07-04 NOTE — Telephone Encounter (Signed)
Called and had to leave a message for patient's daughter [name].

## 2019-07-04 NOTE — Telephone Encounter (Signed)
Fine with me to refill

## 2019-07-04 NOTE — Telephone Encounter (Signed)
DME order sent to [facility] for flutter valve.  Nothing further needed.

## 2019-07-04 NOTE — Telephone Encounter (Signed)
Spoke with patient's daughter listed on DPR, [name], she stated that she needs a replacement flutter valve, the one she has is cracked.  I advised the daughter that I am awaiting a response from Dr. [name]. She would like the order sent to [place].

## 2019-07-12 DIAGNOSIS — J441 Chronic obstructive pulmonary disease with (acute) exacerbation: Secondary | ICD-10-CM | POA: Diagnosis not present

## 2019-07-13 ENCOUNTER — Other Ambulatory Visit: Payer: Self-pay | Admitting: Family Medicine

## 2019-07-13 DIAGNOSIS — F419 Anxiety disorder, unspecified: Secondary | ICD-10-CM

## 2019-07-13 DIAGNOSIS — F5101 Primary insomnia: Secondary | ICD-10-CM

## 2019-07-13 DIAGNOSIS — Z79899 Other long term (current) drug therapy: Secondary | ICD-10-CM

## 2019-07-13 DIAGNOSIS — F339 Major depressive disorder, recurrent, unspecified: Secondary | ICD-10-CM

## 2019-07-14 ENCOUNTER — Other Ambulatory Visit: Payer: Self-pay | Admitting: Family Medicine

## 2019-07-14 ENCOUNTER — Telehealth: Payer: Self-pay | Admitting: Family Medicine

## 2019-07-14 DIAGNOSIS — Z79899 Other long term (current) drug therapy: Secondary | ICD-10-CM

## 2019-07-14 DIAGNOSIS — F5101 Primary insomnia: Secondary | ICD-10-CM

## 2019-07-14 DIAGNOSIS — F419 Anxiety disorder, unspecified: Secondary | ICD-10-CM

## 2019-07-14 DIAGNOSIS — J449 Chronic obstructive pulmonary disease, unspecified: Secondary | ICD-10-CM | POA: Diagnosis not present

## 2019-07-14 DIAGNOSIS — F339 Major depressive disorder, recurrent, unspecified: Secondary | ICD-10-CM

## 2019-07-14 NOTE — Telephone Encounter (Signed)
Pt is scheduled for 07/26/2019. Daughter made aware that we cannot send in refills outside of an office visit. Pt has 6 tabs left and daughter says she can make it until the appt.

## 2019-07-14 NOTE — Telephone Encounter (Signed)
Temazepam is controlled and cannot be filled outside of the visit, I did say 3 months ago that they needed to make this a visit when they walked out and they are due today for the visit.

## 2019-07-14 NOTE — Telephone Encounter (Signed)
April 7,2021 visit with provider.  Refill requested.

## 2019-07-14 NOTE — Telephone Encounter (Signed)
  Prescription Request  07/14/2019  What is the name of the medication or equipment?temazepam (RESTORIL) 15 MG capsule    Have you contacted your pharmacy to request a refill? (if applicable) yes  Which pharmacy would you like this sent to? [facility], pt has appt on with Dr. Warrick Parisian on 07/26/2019   Patient notified that their request is being sent to the clinical staff for review and that they should receive a response within 2 business days.

## 2019-07-19 NOTE — Telephone Encounter (Signed)
Patient and her daughter comes in today upset and wanting to see Dr Dettinger today for med refill of controlled med. She was given appt for 7/20 and per nurse notes - stated understanding.  Dr Dettinger was called and he denied refilling med w/o appt (per pt contract agreement - they should have scheduled this OV at their last [place])  Daughter and pt was made aware and appt for 7/22 was given (because they are going out of town tomorrow)  Daughter was shown last [facility] agreement and made aware of all info. Daughter states she will give her- her xanax to help her sleep over the vacation - FYI

## 2019-07-25 ENCOUNTER — Other Ambulatory Visit: Payer: Self-pay | Admitting: Family Medicine

## 2019-07-26 ENCOUNTER — Ambulatory Visit: Payer: Medicare Other | Admitting: Family Medicine

## 2019-07-28 ENCOUNTER — Encounter: Payer: Self-pay | Admitting: Family Medicine

## 2019-07-28 ENCOUNTER — Ambulatory Visit (INDEPENDENT_AMBULATORY_CARE_PROVIDER_SITE_OTHER): Payer: Medicare Other | Admitting: Family Medicine

## 2019-07-28 ENCOUNTER — Other Ambulatory Visit: Payer: Self-pay

## 2019-07-28 VITALS — BP 124/59 | HR 64 | Temp 97.5°F | Ht 65.0 in | Wt 127.0 lb

## 2019-07-28 DIAGNOSIS — E782 Mixed hyperlipidemia: Secondary | ICD-10-CM | POA: Diagnosis not present

## 2019-07-28 DIAGNOSIS — F339 Major depressive disorder, recurrent, unspecified: Secondary | ICD-10-CM | POA: Diagnosis not present

## 2019-07-28 DIAGNOSIS — F419 Anxiety disorder, unspecified: Secondary | ICD-10-CM | POA: Diagnosis not present

## 2019-07-28 DIAGNOSIS — F5101 Primary insomnia: Secondary | ICD-10-CM

## 2019-07-28 DIAGNOSIS — E039 Hypothyroidism, unspecified: Secondary | ICD-10-CM | POA: Diagnosis not present

## 2019-07-28 MED ORDER — PREDNISONE 20 MG PO TABS
ORAL_TABLET | ORAL | 0 refills | Status: DC
Start: 2019-07-28 — End: 2019-11-02

## 2019-07-28 MED ORDER — DOXEPIN HCL 25 MG PO CAPS: 25.0000 mg | ORAL_CAPSULE | Freq: Every day | ORAL | 3 refills | Status: DC

## 2019-07-28 NOTE — Progress Notes (Signed)
BP (!) 124/59   Pulse 64   Temp (!) 97.5 F (36.4 C)   Ht 5' 5" (1.651 m)   Wt 127 lb (57.6 kg)   SpO2 97%   BMI 21.13 kg/m    Subjective:   Patient ID: Katie Bryant, female    DOB: [birth year], 86 y.o.   MRN: [id number]  HPI: Katie Bryant is a 86 y.o. female presenting on 07/28/2019 for Medical Management of Chronic Issues and Anxiety   HPI Hypothyroidism recheck Patient is coming in for thyroid recheck today as well. They deny any issues with hair changes or heat or cold problems or diarrhea or constipation. They deny any chest pain or palpitations. They are currently on levothyroxine 50 micrograms   Hyperlipidemia Patient is coming in for recheck of his hyperlipidemia. The patient is currently taking atorvastatin. They deny any issues with myalgias or history of liver damage from it. They deny any focal numbness or weakness or chest pain.   Anxiety and depression insomnia Patient is coming in for anxiety depression insomnia.  She is overdue for appointment because they did not make a 70-monthappointment at the last 1 and forgot to.  Patient admits to openly using some of her daughters benzodiazepine and therefore breaching the contract.  We discussed other options that would help that are nonbenzodiazepine and noncontrolled.   Relevant past medical, surgical, family and social history reviewed and updated as indicated. Interim medical history since our last visit reviewed. Allergies and medications reviewed and updated.  Review of Systems  Constitutional: Negative for chills and fever.  Eyes: Negative for redness and visual disturbance.  Respiratory: Negative for chest tightness and shortness of breath.   Cardiovascular: Negative for chest pain and leg swelling.  Musculoskeletal: Negative for back pain and gait problem.  Skin: Negative for rash.  Neurological: Negative for light-headedness and headaches.  Psychiatric/Behavioral: Positive for dysphoric mood and sleep  disturbance. Negative for agitation, behavioral problems, self-injury and suicidal ideas. The patient is nervous/anxious.   All other systems reviewed and are negative.   Per HPI unless specifically indicated above   Allergies as of 07/28/2019   No Known Allergies     Medication List       Accurate as of July 28, 2019 12:25 PM. If you have any questions, ask your nurse or doctor.        STOP taking these medications   temazepam 15 MG capsule Commonly known as: RESTORIL Stopped by: JFransisca KaufmannDettinger, MD     TAKE these medications   acetaminophen 500 MG tablet Commonly known as: TYLENOL Take 500 mg by mouth every 6 (six) hours as needed (pain).   ARIPiprazole 2 MG tablet Commonly known as: ABILIFY Take 1 tablet (2 mg total) by mouth daily.   aspirin 81 MG tablet Take 81 mg by mouth daily.   atorvastatin 40 MG tablet Commonly known as: LIPITOR TAKE 1 TABLET DAILY   betaxolol 0.25 % ophthalmic suspension Commonly known as: BETOPTIC-S Place 1 drop into both eyes every morning.   brimonidine 0.2 % ophthalmic solution Commonly known as: ALPHAGAN Place 1 drop into both eyes daily.   budesonide 0.5 MG/2ML nebulizer solution Commonly known as: PULMICORT NEBULIZE 1 VIAL TWICE A DAY   buPROPion 200 MG 12 hr tablet Commonly known as: WELLBUTRIN SR Take 1 tablet (200 mg total) by mouth 2 (two) times daily. (Needs to be seen before next refill)   chlorpheniramine 4 MG tablet Commonly known as: CHLOR-TRIMETON  Take 4 mg by mouth every 4 (four) hours as needed (drippy nose, drainage).   citalopram 20 MG tablet Commonly known as: CELEXA Take 1 tablet (20 mg total) by mouth daily.   Delsym 30 MG/5ML liquid Generic drug: dextromethorphan Take 30 mg 2 (two) times daily as needed by mouth for cough.   dextromethorphan-guaiFENesin 30-600 MG 12hr tablet Commonly known as: MUCINEX DM Take 1 tablet 2 (two) times daily as needed by mouth for cough.   doxepin 25 MG  capsule Commonly known as: SINEQUAN Take 1 capsule (25 mg total) by mouth at bedtime. Started by: Fransisca Kaufmann , MD   fenofibrate micronized 134 MG capsule Commonly known as: LOFIBRA TAKE (1) CAPSULE DAILY BEFORE BREAKFAST.   Flutter Devi As needed   ipratropium-albuterol 0.5-2.5 (3) MG/3ML Soln Commonly known as: DUONEB NEBULIZE 1 VIAL EVERY 6 HOURS AS NEEDED   ketorolac 0.5 % ophthalmic solution Commonly known as: ACULAR Place 1 drop into both eyes 4 (four) times daily.   levothyroxine 50 MCG tablet Commonly known as: SYNTHROID TAKE 1 TABLET DAILY   loteprednol 0.5 % ophthalmic suspension Commonly known as: LOTEMAX Place 1 drop into both eyes 2 (two) times daily.   meclizine 25 MG tablet Commonly known as: ANTIVERT Take 25 mg every 8 (eight) hours as needed by mouth for dizziness.   memantine 28 MG Cp24 24 hr capsule Commonly known as: NAMENDA XR TAKE 1 CAPSULE ONCE DAILY   metoprolol tartrate 25 MG tablet Commonly known as: LOPRESSOR TAKE (1/2) TABLET TWICE DAILY.   montelukast 10 MG tablet Commonly known as: SINGULAIR TAKE ONE TABLET AT BEDTIME   omeprazole 20 MG capsule Commonly known as: PRILOSEC TAKE  (1)  CAPSULE  TWICE DAILY (TAKE ON AN EMPTY STOMACH AT LEAST 30 MIN- UTES BEFORE MEALS).   OXYGEN Oxygen 3lpm 24/7, Increase to 4L as needed for shortness of breath with activity   predniSONE 20 MG tablet Commonly known as: DELTASONE Take 3 tabs daily for 1 week, then 2 tabs daily for week 2, then 1 tab daily for week 3. Started by: Worthy Rancher, MD   ProAir HFA 108 [phone] Base) MCG/ACT inhaler Generic drug: albuterol INHALE 2 PUFFS BY MOUTH EVERY 6 HOURS AS NEEDED   Rhopressa 0.02 % Soln Generic drug: Netarsudil Dimesylate Place 1 drop into both eyes at bedtime.   sodium chloride 0.65 % Soln nasal spray Commonly known as: OCEAN 2 puffs every 4-6 hours as needed for nasal congestion   valsartan-hydrochlorothiazide 80-12.5 MG  tablet Commonly known as: DIOVAN-HCT TAKE 1 TABLET DAILY   Vitamin D 50 MCG (2000 UT) tablet Take 2,000 Units by mouth daily.        Objective:   BP (!) 124/59   Pulse 64   Temp (!) 97.5 F (36.4 C)   Ht 5' 5" (1.651 m)   Wt 127 lb (57.6 kg)   SpO2 97%   BMI 21.13 kg/m   Wt Readings from Last 3 Encounters:  07/28/19 127 lb (57.6 kg)  02/07/19 133 lb (60.3 kg)  10/06/18 135 lb (61.2 kg)    Physical Exam Vitals and nursing note reviewed.  Constitutional:      General: She is not in acute distress.    Appearance: She is well-developed. She is not diaphoretic.  Eyes:     Conjunctiva/sclera: Conjunctivae normal.     Pupils: Pupils are equal, round, and reactive to light.  Cardiovascular:     Rate and Rhythm: Normal rate and regular rhythm.  Heart sounds: Normal heart sounds. No murmur heard.   Pulmonary:     Effort: Pulmonary effort is normal. No respiratory distress.     Breath sounds: Normal breath sounds. No wheezing.  Musculoskeletal:        General: No tenderness. Normal range of motion.  Skin:    General: Skin is warm and dry.     Findings: No rash.  Neurological:     Mental Status: She is alert and oriented to person, place, and time.     Coordination: Coordination normal.  Psychiatric:        Mood and Affect: Mood is anxious and depressed.        Behavior: Behavior normal.        Thought Content: Thought content does not include suicidal ideation. Thought content does not include suicidal plan.      Assessment & Plan:   Problem List Items Addressed This Visit      Endocrine   Hypothyroidism - Primary   Relevant Orders   TSH     Other   Insomnia   Hyperlipidemia   Relevant Orders   Lipid panel   Depression, recurrent (HCC)   Relevant Medications   doxepin (SINEQUAN) 25 MG capsule   Other Relevant Orders   CMP14+EGFR   Anxiety   Relevant Medications   doxepin (SINEQUAN) 25 MG capsule   Other Relevant Orders   CBC with  Differential/Platelet      Patient breached benzodiazepine contract, will switch to doxepin for insomnia and anxiety.  Will check blood work today for cholesterol and thyroid. Follow up plan: Return in about 3 months (around 10/28/2019), or if symptoms worsen or fail to improve, for hypthyroid.  Counseling provided for all of the vaccine components Orders Placed This Encounter  Procedures  . CBC with Differential/Platelet  . CMP14+EGFR  . Lipid panel  . TSH    Caryl Pina, MD [facility] 07/28/2019, 12:25 PM

## 2019-07-29 LAB — CMP14+EGFR
ALT: 16 IU/L (ref 0–32)
AST: 21 IU/L (ref 0–40)
Albumin/Globulin Ratio: 1.5 (ref 1.2–2.2)
Albumin: 3.8 g/dL (ref 3.6–4.6)
Alkaline Phosphatase: 56 IU/L (ref 48–121)
BUN/Creatinine Ratio: 24 (ref 12–28)
BUN: 20 mg/dL (ref 8–27)
Bilirubin Total: 0.3 mg/dL (ref 0.0–1.2)
CO2: 31 mmol/L — ABNORMAL HIGH (ref 20–29)
Calcium: 9.3 mg/dL (ref 8.7–10.3)
Chloride: 94 mmol/L — ABNORMAL LOW (ref 96–106)
Creatinine, Ser: 0.84 mg/dL (ref 0.57–1.00)
GFR calc Af Amer: 72 mL/min/{1.73_m2} (ref >59)
GFR calc non Af Amer: 62 mL/min/{1.73_m2} (ref >59)
Globulin, Total: 2.6 g/dL (ref 1.5–4.5)
Glucose: 60 mg/dL — ABNORMAL LOW (ref 65–99)
Potassium: 4.3 mmol/L (ref 3.5–5.2)
Sodium: 139 mmol/L (ref 134–144)
Total Protein: 6.4 g/dL (ref 6.0–8.5)

## 2019-07-29 LAB — LIPID PANEL
Chol/HDL Ratio: 1.6 ratio (ref 0.0–4.4)
Cholesterol, Total: 153 mg/dL (ref 100–199)
HDL: 93 mg/dL (ref >39)
LDL Chol Calc (NIH): 50 mg/dL (ref 0–99)
Triglycerides: 47 mg/dL (ref 0–149)
VLDL Cholesterol Cal: 10 mg/dL (ref 5–40)

## 2019-07-29 LAB — CBC WITH DIFFERENTIAL/PLATELET
Basophils Absolute: 0.1 10*3/uL (ref 0.0–0.2)
Basos: 1 %
EOS (ABSOLUTE): 0.3 10*3/uL (ref 0.0–0.4)
Eos: 3 %
Hematocrit: 30.6 % — ABNORMAL LOW (ref 34.0–46.6)
Hemoglobin: 10.1 g/dL — ABNORMAL LOW (ref 11.1–15.9)
Immature Grans (Abs): 0 10*3/uL (ref 0.0–0.1)
Immature Granulocytes: 0 %
Lymphocytes Absolute: 1.4 10*3/uL (ref 0.7–3.1)
Lymphs: 15 %
MCH: 28.5 pg (ref 26.6–33.0)
MCHC: 33 g/dL (ref 31.5–35.7)
MCV: 86 fL (ref 79–97)
Monocytes Absolute: 1 10*3/uL — ABNORMAL HIGH (ref 0.1–0.9)
Monocytes: 11 %
Neutrophils Absolute: 6.3 10*3/uL (ref 1.4–7.0)
Neutrophils: 70 %
Platelets: 217 10*3/uL (ref 150–450)
RBC: 3.54 x10E6/uL — ABNORMAL LOW (ref 3.77–5.28)
RDW: 12 % (ref 11.7–15.4)
WBC: 9.2 10*3/uL (ref 3.4–10.8)

## 2019-07-29 LAB — TSH: TSH: 2.41 u[IU]/mL (ref 0.450–4.500)

## 2019-08-01 ENCOUNTER — Telehealth: Payer: Self-pay | Admitting: Family Medicine

## 2019-08-01 NOTE — Telephone Encounter (Signed)
Pt's daughter says pt has severe dry mouth after taking doxepin so she wants to know if there is anything else they can try? Is it ok to give her benadryl at night to sleep?

## 2019-08-02 MED ORDER — TRAZODONE HCL 50 MG PO TABS: 25.0000 mg | ORAL_TABLET | Freq: Every evening | ORAL | 3 refills | Status: DC | PRN

## 2019-08-02 NOTE — Telephone Encounter (Signed)
Trazadone sent to the pharmacy- needs to only take 1/2 tablet at first

## 2019-08-02 NOTE — Telephone Encounter (Signed)
Pt has not tried Trazodone in the past. Daughter is willing to try it though.  Send Rx to [facility]. Daughter knows to contact pharmacy this pm for refill. No need to call back.

## 2019-08-02 NOTE — Telephone Encounter (Signed)
Patient aware and verbalized understanding. °

## 2019-08-02 NOTE — Telephone Encounter (Signed)
I am afraid benadryl will make her fall if she gets up. Have we tried trazadone?

## 2019-08-03 DIAGNOSIS — C44729 Squamous cell carcinoma of skin of left lower limb, including hip: Secondary | ICD-10-CM | POA: Diagnosis not present

## 2019-08-08 ENCOUNTER — Encounter: Payer: Self-pay | Admitting: Internal Medicine

## 2019-08-08 ENCOUNTER — Other Ambulatory Visit: Payer: Self-pay

## 2019-08-08 ENCOUNTER — Ambulatory Visit: Payer: Medicare Other | Admitting: Internal Medicine

## 2019-08-08 DIAGNOSIS — J449 Chronic obstructive pulmonary disease, unspecified: Secondary | ICD-10-CM

## 2019-08-08 DIAGNOSIS — J9612 Chronic respiratory failure with hypercapnia: Secondary | ICD-10-CM | POA: Diagnosis not present

## 2019-08-08 NOTE — Progress Notes (Signed)
Subjective:     Patient ID: Katie Bryant, female   DOB: [birth year]    MRN: [id number]   Brief patient profile:  50 yowf quit smoking 1992 with GOLD III copd dx 2009 eval in pulm clinic p hospitalized at [facility] 12/21/10   History of Present Illness   10/06/2018 T Parrett  OV > med calendar   02/07/2019  f/u ov/Kynlea Blackston re: GOLD III spirometry but 02 dep 24/7 / following med calendar well/ living with daughter Chief Complaint  Patient presents with  . Follow-up    Breathing is about the same. She has cough when she lies down- non prod. She is using her proair only when she leaves her house- not leaving much.   Dyspnea:  Around the house = MMRC3 = can't walk 100 yards even at a slow pace at a flat grade s stopping due to sob Cough: only hs and  Sleeping: flat/ two pillows  SABA use: rarely 02:  4lpm concentrator and 2lpm POC rec  No change in medications Adjust 02 during the day to keep sats above 92% - no need to push higher See calendar for specific medication instructions   08/08/2019  f/u ov/Dallyn Bergland re:  GOLD IIII/ 02 dep  Flare 07/28/19 rx prednisone  Chief Complaint  Patient presents with  . Follow-up    wheezing and coughing  Dyspnea:  Room to room on rollator 3lpm  Cough: nothing unusual Sleeping: flat bed / 2 pillows  SABA use none 02: sleeps on 3.5 / sitting same then with POC 2 sitting up 3 walkig/    No obvious day to day or daytime variability or assoc excess/ purulent sputum or mucus plugs or hemoptysis or cp or chest tightness, subjective wheeze or overt sinus or hb symptoms.   Sleeping as above  without nocturnal  or early am exacerbation  of respiratory  c/o's or need for noct saba. Also denies any obvious fluctuation of symptoms with weather or environmental changes or other aggravating or alleviating factors except as outlined above   No unusual exposure hx or h/o childhood pna/ asthma or knowledge of premature birth.  Current Allergies, Complete Past Medical History,  Past Surgical History, Family History, and Social History were reviewed in [employer] record.  ROS  The following are not active complaints unless bolded Hoarseness, sore throat, dysphagia, dental problems, itching, sneezing,  nasal congestion or discharge of excess mucus or purulent secretions, ear ache,   fever, chills, sweats, unintended wt loss or wt gain, classically pleuritic or exertional cp,  orthopnea pnd or arm/hand swelling  or leg swelling, presyncope, palpitations, abdominal pain, anorexia, nausea, vomiting, diarrhea  or change in bowel habits or change in bladder habits, change in stools or change in urine, dysuria, hematuria,  rash, arthralgias, visual complaints, headache, numbness, weakness or ataxia or problems with walking or coordination,  change in mood or  memory.        Current Meds  Medication Sig  . acetaminophen (TYLENOL) 500 MG tablet Take 500 mg by mouth every 6 (six) hours as needed (pain).   . ARIPiprazole (ABILIFY) 2 MG tablet Take 1 tablet (2 mg total) by mouth daily.  Marland Kitchen aspirin 81 MG tablet Take 81 mg by mouth daily.  Marland Kitchen atorvastatin (LIPITOR) 40 MG tablet TAKE 1 TABLET DAILY  . betaxolol (BETOPTIC-S) 0.25 % ophthalmic suspension Place 1 drop into both eyes every morning.  . brimonidine (ALPHAGAN) 0.2 % ophthalmic solution Place 1 drop into both eyes  daily.  . budesonide (PULMICORT) 0.5 MG/2ML nebulizer solution NEBULIZE 1 VIAL TWICE A DAY  . buPROPion (WELLBUTRIN SR) 200 MG 12 hr tablet Take 1 tablet (200 mg total) by mouth 2 (two) times daily. (Needs to be seen before next refill)  . chlorpheniramine (CHLOR-TRIMETON) 4 MG tablet Take 4 mg by mouth every 4 (four) hours as needed (drippy nose, drainage).  . Cholecalciferol (VITAMIN D) 50 MCG (2000 UT) tablet Take 2,000 Units by mouth daily.  . citalopram (CELEXA) 20 MG tablet Take 1 tablet (20 mg total) by mouth daily.  Marland Kitchen dextromethorphan (DELSYM) 30 MG/5ML liquid Take 30 mg 2 (two) times  daily as needed by mouth for cough.  . dextromethorphan-guaiFENesin (MUCINEX DM) 30-600 MG 12hr tablet Take 1 tablet 2 (two) times daily as needed by mouth for cough.  . doxepin (SINEQUAN) 25 MG capsule Take 1 capsule (25 mg total) by mouth at bedtime.  . fenofibrate micronized (LOFIBRA) 134 MG capsule TAKE (1) CAPSULE DAILY BEFORE BREAKFAST.  Marland Kitchen ipratropium-albuterol (DUONEB) 0.5-2.5 (3) MG/3ML SOLN NEBULIZE 1 VIAL EVERY 6 HOURS AS NEEDED  . ketorolac (ACULAR) 0.5 % ophthalmic solution Place 1 drop into both eyes 4 (four) times daily.  Marland Kitchen levothyroxine (SYNTHROID) 50 MCG tablet TAKE 1 TABLET DAILY  . loteprednol (LOTEMAX) 0.5 % ophthalmic suspension Place 1 drop into both eyes 2 (two) times daily.  . meclizine (ANTIVERT) 25 MG tablet Take 25 mg every 8 (eight) hours as needed by mouth for dizziness.  . memantine (NAMENDA XR) 28 MG CP24 24 hr capsule TAKE 1 CAPSULE ONCE DAILY  . metoprolol tartrate (LOPRESSOR) 25 MG tablet TAKE (1/2) TABLET TWICE DAILY.  . montelukast (SINGULAIR) 10 MG tablet TAKE ONE TABLET AT BEDTIME  . omeprazole (PRILOSEC) 20 MG capsule TAKE  (1)  CAPSULE  TWICE DAILY (TAKE ON AN EMPTY STOMACH AT LEAST 30 MIN- UTES BEFORE MEALS).  . OXYGEN Oxygen 3lpm 24/7, Increase to 4L as needed for shortness of breath with activity  . predniSONE (DELTASONE) 20 MG tablet Take 3 tabs daily for 1 week, then 2 tabs daily for week 2, then 1 tab daily for week 3.  . PROAIR HFA 108 (90 Base) MCG/ACT inhaler INHALE 2 PUFFS BY MOUTH EVERY 6 HOURS AS NEEDED  . Respiratory Therapy Supplies (FLUTTER) DEVI As needed  . RHOPRESSA 0.02 % SOLN Place 1 drop into both eyes at bedtime.  . sodium chloride (OCEAN) 0.65 % SOLN nasal spray 2 puffs every 4-6 hours as needed for nasal congestion  . traZODone (DESYREL) 50 MG tablet Take 0.5-1 tablets (25-50 mg total) by mouth at bedtime as needed for sleep.  . valsartan-hydrochlorothiazide (DIOVAN-HCT) 80-12.5 MG tablet TAKE 1 TABLET DAILY                     Objective:  Physical Exam     08/08/2019      132 02/07/2019     133   07/06/2018  141   10/05/2017   149  03/19/2017   146 >   Wt 184 01/21/2011  >08/27/2011 186 > 180 09/29/2011 > 10/07/2011  183 > 11/11/2011  181 > 01/13/2012  183 >180 02/12/2012 > 06/09/2012 188 > 189 10/12/2012 >188 01/14/2013 > 01/21/2013 187 > 03/02/2013 >184 03/18/2013 >  03/28/2013 186> 06/06/2013  182 >  07/18/2013  179 >181 09/27/2013 > 12/08/2013   183 > 02/24/2014  178 > 06/09/2014 177 > 09/07/2014 175 > 12/05/2014   173 > 03/06/2015 173>  10/16/2015  171 >  04/28/2016  167 > 11/04/2016  155 >    Vital signs reviewed  08/08/2019  - Note at rest 02 sats  99% on 3lpm POC     HEENT : pt wearing mask not removed for exam due to covid -19 concerns.    NECK :  without JVD/Nodes/TM/ nl carotid upstrokes bilaterally   LUNGS: no acc muscle use,  Mod barrel  contour chest wall with bilateral  Distant bs s audible wheeze and  without cough on insp or exp maneuvers and mod  Hyperresonant  to  percussion bilaterally     CV:  RRR  no s3 or murmur or increase in P2, and no edema   ABD:  soft and nontender with pos mid insp Hoover's  in the supine position. No bruits or organomegaly appreciated, bowel sounds nl  MS:     ext warm without deformities, calf tenderness, cyanosis or clubbing No obvious joint restrictions   SKIN: warm and dry without lesions    NEURO:  alert, approp, nl sensorium with  no motor or cerebellar deficits apparent.

## 2019-08-08 NOTE — Assessment & Plan Note (Signed)
Dr Annamaria Boots started 02 around 2010    - 3 lpm 24/7 except 4lpm with activity as of 03/02/2013     - HC03 trending in low 30's since 2012 so likely hypercarbic component as well   - 02/24/2014   Walked RA x one lap @ 185 stopped due to  desat to 83% corrected on 4lpm    - 12/05/2014   Walked 3lpm pulsed x 2 laps @ 185 ft each stopped due to sob with sat 89% at moderate pace    - 03/06/2015   Walked 4lpm 2 laps @ 185 ft each stopped due to  Sob/ desat to 86% at pace faster than her nl  - 11/04/2016  HCO3  =32  -  12/08/2016  Apri notified "does not qualify for POC "  - not clear whether titration attempted.     rx as of 08/08/2019  = 3.5 lpm 24/7  And titrate to keep > 90% with activity      Adequate control on present rx, reviewed in detail with pt > no change in rx needed             Each maintenance medication was reviewed in detail including most importantly the difference between maintenance and as needed and under what circumstances the prns are to be used. This was done in the context of a medication calendar review which provided the patient with a user-friendly unambiguous mechanism for medication administration and reconciliation and provides an action plan for all active problems. It is critical that this be shown to every doctor  for modification during the office visit if necessary so the patient can use it as a working document.      Total time for H and P, chart review, counseling, teaching device and generating customized AVS unique to this office visit / charting = 20 min

## 2019-08-08 NOTE — Patient Instructions (Signed)
No change in medications  See calendar for specific medication instructions and bring it back for each and every office visit for every healthcare provider you see.  Without it,  you may not receive the best quality medical care that we feel you deserve.  You will note that the calendar groups together  your maintenance  medications that are timed at particular times of the day.  Think of this as your checklist for what your doctor has instructed you to do until your next evaluation to see what benefit  there is  to staying on a consistent group of medications intended to keep you well.  The other group at the bottom is entirely up to you to use as you see fit  for specific symptoms that may arise between visits that require you to treat them on an as needed basis.  Think of this as your action plan or "what if" list.   Separating the top medications from the bottom group is fundamental to providing you adequate care going forward.     Please schedule a follow up visit in 6  months - [place] office is fine or here  - but call sooner if needed

## 2019-08-08 NOTE — Assessment & Plan Note (Signed)
Quit smoking 1992    - PFT's 06/11/2007  FEV1   0.70 (34%) ratio 32 with 49% improvement p B2    - HFA 75% effective p coaching 06/20/2011 > 50% 06/24/2011  -Med calendar 09/27/2013 > did not bring as requested 06/09/14 , 06/05/2015 redone  - improved reconciliation 09/07/2014 and 03/07/2015 and 10/16/2015   - 10/05/2017  After extensive coaching inhaler device,  effectiveness =    50%    Group D in terms of symptom/risk and laba/lama/ICS  therefore appropriate rx at this point >>>  maint on sama/saba/ICS per neb as the most affordable combo for now, doing reasonable well since daughter took over on her meds.

## 2019-08-09 ENCOUNTER — Other Ambulatory Visit: Payer: Self-pay | Admitting: Family Medicine

## 2019-08-10 ENCOUNTER — Other Ambulatory Visit: Payer: Self-pay | Admitting: Family Medicine

## 2019-08-10 MED ORDER — IPRATROPIUM-ALBUTEROL 0.5-2.5 (3) MG/3ML IN SOLN: RESPIRATORY_TRACT | 1 refills | Status: DC

## 2019-08-14 DIAGNOSIS — J449 Chronic obstructive pulmonary disease, unspecified: Secondary | ICD-10-CM | POA: Diagnosis not present

## 2019-08-30 ENCOUNTER — Other Ambulatory Visit: Payer: Self-pay | Admitting: Internal Medicine

## 2019-09-05 ENCOUNTER — Encounter (INDEPENDENT_AMBULATORY_CARE_PROVIDER_SITE_OTHER): Payer: Medicare Other | Admitting: Ophthalmology

## 2019-09-13 ENCOUNTER — Encounter (INDEPENDENT_AMBULATORY_CARE_PROVIDER_SITE_OTHER): Payer: Medicare Other | Admitting: Ophthalmology

## 2019-09-14 DIAGNOSIS — J449 Chronic obstructive pulmonary disease, unspecified: Secondary | ICD-10-CM | POA: Diagnosis not present

## 2019-09-15 ENCOUNTER — Encounter (INDEPENDENT_AMBULATORY_CARE_PROVIDER_SITE_OTHER): Payer: Medicare Other | Admitting: Ophthalmology

## 2019-09-15 ENCOUNTER — Other Ambulatory Visit: Payer: Self-pay

## 2019-09-15 DIAGNOSIS — H353231 Exudative age-related macular degeneration, bilateral, with active choroidal neovascularization: Secondary | ICD-10-CM

## 2019-09-15 DIAGNOSIS — H35033 Hypertensive retinopathy, bilateral: Secondary | ICD-10-CM

## 2019-09-15 DIAGNOSIS — H43813 Vitreous degeneration, bilateral: Secondary | ICD-10-CM | POA: Diagnosis not present

## 2019-09-15 DIAGNOSIS — I10 Essential (primary) hypertension: Secondary | ICD-10-CM | POA: Diagnosis not present

## 2019-09-20 ENCOUNTER — Ambulatory Visit: Payer: Medicare Other | Admitting: *Deleted

## 2019-09-20 ENCOUNTER — Other Ambulatory Visit: Payer: Self-pay | Admitting: Family Medicine

## 2019-09-20 DIAGNOSIS — F339 Major depressive disorder, recurrent, unspecified: Secondary | ICD-10-CM

## 2019-09-20 DIAGNOSIS — F419 Anxiety disorder, unspecified: Secondary | ICD-10-CM

## 2019-09-20 DIAGNOSIS — F02818 Dementia in other diseases classified elsewhere, unspecified severity, with other behavioral disturbance: Secondary | ICD-10-CM

## 2019-09-20 DIAGNOSIS — G301 Alzheimer's disease with late onset: Secondary | ICD-10-CM

## 2019-09-20 DIAGNOSIS — I10 Essential (primary) hypertension: Secondary | ICD-10-CM

## 2019-09-20 DIAGNOSIS — Z79899 Other long term (current) drug therapy: Secondary | ICD-10-CM

## 2019-09-20 DIAGNOSIS — F5101 Primary insomnia: Secondary | ICD-10-CM

## 2019-09-20 DIAGNOSIS — J449 Chronic obstructive pulmonary disease, unspecified: Secondary | ICD-10-CM

## 2019-09-20 NOTE — Patient Instructions (Signed)
Katie Bryant, BSN, RN-BC Embedded Chronic Care Manager [facility] Direct Dial: [phone]    

## 2019-09-20 NOTE — Chronic Care Management (AMB) (Signed)
  Chronic Care Management   Initial Visit Note  09/20/2019 Name: Katie Bryant MRN: [id number] DOB: [birth year]  Referred by: Dettinger, Fransisca Kaufmann, MD Reason for referral : Chronic Care Management (Initial Visit)   Katie Bryant is a 86 y.o. year old female who is a primary care patient of Dettinger, Fransisca Kaufmann, MD. The CCM team was consulted for assistance with chronic disease management and care coordination needs related to CAD, HTN, HLD, COPD and macular degeneration  Review of patient status, including review of consultants reports, relevant laboratory and other test results, and collaboration with appropriate care team members and the patient's provider was performed as part of comprehensive patient evaluation and provision of chronic care management services.    SDOH (Social Determinants of Health) assessments performed: Yes See Care Plan activities for detailed interventions related to SDOH    I spoke with Katie Bryant's daughter and caregiver, Katie Bryant. Katie Bryant lives with her daughter and son-in-law and does not have any resource or CCM needs at this time. They are able to provide for her care. Katie Bryant also feels that her medical conditions are well controlled and that CCM is not needed at this time. She was provided with RN Care Manager contact number and will reach out in the future if services are needed.  Plan:    CCM enrollment status changed to "previously enrolled" as per patient request on 09/20/19 to discontinue enrollment. Case closed to case management services in primary care home.   Chong Sicilian, BSN, RN-BC Embedded Chronic Care Manager [facility] Direct Dial: [phone]

## 2019-10-04 DIAGNOSIS — J449 Chronic obstructive pulmonary disease, unspecified: Secondary | ICD-10-CM | POA: Diagnosis not present

## 2019-10-06 ENCOUNTER — Other Ambulatory Visit: Payer: Self-pay | Admitting: Family Medicine

## 2019-10-14 ENCOUNTER — Other Ambulatory Visit: Payer: Self-pay

## 2019-10-14 ENCOUNTER — Ambulatory Visit (INDEPENDENT_AMBULATORY_CARE_PROVIDER_SITE_OTHER): Payer: Medicare Other

## 2019-10-14 DIAGNOSIS — J449 Chronic obstructive pulmonary disease, unspecified: Secondary | ICD-10-CM | POA: Diagnosis not present

## 2019-10-14 DIAGNOSIS — Z23 Encounter for immunization: Secondary | ICD-10-CM

## 2019-10-19 ENCOUNTER — Other Ambulatory Visit: Payer: Self-pay | Admitting: Family Medicine

## 2019-10-20 ENCOUNTER — Other Ambulatory Visit: Payer: Self-pay | Admitting: Family Medicine

## 2019-11-02 ENCOUNTER — Ambulatory Visit (INDEPENDENT_AMBULATORY_CARE_PROVIDER_SITE_OTHER): Payer: Medicare Other | Admitting: Family Medicine

## 2019-11-02 ENCOUNTER — Other Ambulatory Visit: Payer: Self-pay

## 2019-11-02 ENCOUNTER — Encounter: Payer: Self-pay | Admitting: Family Medicine

## 2019-11-02 VITALS — BP 146/64 | HR 78 | Temp 97.0°F | Ht 65.0 in | Wt 130.0 lb

## 2019-11-02 DIAGNOSIS — E782 Mixed hyperlipidemia: Secondary | ICD-10-CM | POA: Diagnosis not present

## 2019-11-02 DIAGNOSIS — E039 Hypothyroidism, unspecified: Secondary | ICD-10-CM

## 2019-11-02 DIAGNOSIS — Z79899 Other long term (current) drug therapy: Secondary | ICD-10-CM

## 2019-11-02 DIAGNOSIS — F339 Major depressive disorder, recurrent, unspecified: Secondary | ICD-10-CM

## 2019-11-02 DIAGNOSIS — F419 Anxiety disorder, unspecified: Secondary | ICD-10-CM | POA: Diagnosis not present

## 2019-11-02 DIAGNOSIS — I10 Essential (primary) hypertension: Secondary | ICD-10-CM | POA: Diagnosis not present

## 2019-11-02 DIAGNOSIS — F5101 Primary insomnia: Secondary | ICD-10-CM

## 2019-11-02 MED ORDER — VALSARTAN-HYDROCHLOROTHIAZIDE 80-12.5 MG PO TABS
1.0000 | ORAL_TABLET | Freq: Every day | ORAL | 3 refills | Status: DC
Start: 2019-11-02 — End: 2020-11-01

## 2019-11-02 MED ORDER — MEMANTINE HCL ER 28 MG PO CP24
28.0000 mg | ORAL_CAPSULE | Freq: Every day | ORAL | 3 refills | Status: DC
Start: 2019-11-02 — End: 2020-11-01

## 2019-11-02 MED ORDER — BUPROPION HCL ER (SR) 200 MG PO TB12
200.0000 mg | ORAL_TABLET | Freq: Two times a day (BID) | ORAL | 3 refills | Status: DC
Start: 2019-11-02 — End: 2020-11-01

## 2019-11-02 MED ORDER — ARIPIPRAZOLE 2 MG PO TABS: 2.0000 mg | ORAL_TABLET | Freq: Every day | ORAL | 3 refills | Status: DC

## 2019-11-02 MED ORDER — CITALOPRAM HYDROBROMIDE 20 MG PO TABS: 20.0000 mg | ORAL_TABLET | Freq: Every day | ORAL | 3 refills | Status: DC

## 2019-11-02 MED ORDER — MONTELUKAST SODIUM 10 MG PO TABS
10.0000 mg | ORAL_TABLET | Freq: Every day | ORAL | 3 refills | Status: DC
Start: 2019-11-02 — End: 2020-11-01

## 2019-11-02 MED ORDER — LEVOTHYROXINE SODIUM 50 MCG PO TABS
50.0000 ug | ORAL_TABLET | Freq: Every day | ORAL | 3 refills | Status: DC
Start: 2019-11-02 — End: 2020-11-01

## 2019-11-02 NOTE — Progress Notes (Signed)
BP (!) 146/64   Pulse 78   Temp (!) 97 F (36.1 C)   Ht 5\' 5"  (1.651 m)   Wt 130 lb (59 kg)   SpO2 94%   BMI 21.63 kg/m    Subjective:   Patient ID: Katie Bryant, female    DOB: 1928/11/10, 86 y.o.   MRN: [id number]  HPI: Katie Bryant is a 86 y.o. female presenting on 11/02/2019 for Medical Management of Chronic Issues, Hypothyroidism, and Anxiety (no longer taking xanax. just Trazodone)   HPI Patient is coming in for anxiety and insomnia recheck.  She has weaned herself off the Xanax and is taking the trazodone and also takes the citalopram and feels like this is controlling and stabilizing things.  She also takes Wellbutrin and Abilify to help with this.  Hypothyroidism recheck Patient is coming in for thyroid recheck today as well. They deny any issues with hair changes or heat or cold problems or diarrhea or constipation. They deny any chest pain or palpitations. They are currently on levothyroxine 50 micrograms   Hyperlipidemia Patient is coming in for recheck of his hyperlipidemia. The patient is currently taking atorvastatin and fenofibrate. They deny any issues with myalgias or history of liver damage from it. They deny any focal numbness or weakness or chest pain.   Hypertension Patient is currently on metoprolol and valsartan hydrochlorothiazide, and their blood pressure today is 146/64. Patient denies any lightheadedness or dizziness. Patient denies headaches, blurred vision, chest pains, shortness of breath, or weakness. Denies any side effects from medication and is content with current medication.   Relevant past medical, surgical, family and social history reviewed and updated as indicated. Interim medical history since our last visit reviewed. Allergies and medications reviewed and updated.  Review of Systems  Constitutional: Negative for chills and fever.  Eyes: Negative for visual disturbance.  Respiratory: Negative for chest tightness and shortness of  breath.   Cardiovascular: Negative for chest pain and leg swelling.  Musculoskeletal: Negative for back pain and gait problem.  Skin: Negative for rash.  Neurological: Negative for light-headedness and headaches.  Psychiatric/Behavioral: Positive for sleep disturbance. Negative for agitation, behavioral problems, dysphoric mood, self-injury and suicidal ideas. The patient is nervous/anxious.   All other systems reviewed and are negative.   Per HPI unless specifically indicated above   Allergies as of 11/02/2019   No Known Allergies     Medication List       Accurate as of November 02, 2019 11:46 AM. If you have any questions, ask your nurse or doctor.        STOP taking these medications   doxepin 25 MG capsule Commonly known as: SINEQUAN Stopped by: Fransisca Kaufmann Florine Sprenkle, MD   predniSONE 20 MG tablet Commonly known as: DELTASONE Stopped by: Fransisca Kaufmann Karne Ozga, MD     TAKE these medications   acetaminophen 500 MG tablet Commonly known as: TYLENOL Take 500 mg by mouth every 6 (six) hours as needed (pain).   ARIPiprazole 2 MG tablet Commonly known as: ABILIFY Take 1 tablet (2 mg total) by mouth daily.   aspirin 81 MG tablet Take 81 mg by mouth daily.   atorvastatin 40 MG tablet Commonly known as: LIPITOR TAKE 1 TABLET DAILY   betaxolol 0.25 % ophthalmic suspension Commonly known as: BETOPTIC-S Place 1 drop into both eyes every morning.   brimonidine 0.2 % ophthalmic solution Commonly known as: ALPHAGAN Place 1 drop into both eyes daily.   budesonide 0.5 MG/2ML  nebulizer solution Commonly known as: PULMICORT NEBULIZE 1 VIAL TWICE A DAY   buPROPion 200 MG 12 hr tablet Commonly known as: WELLBUTRIN SR Take 1 tablet (200 mg total) by mouth 2 (two) times daily. What changed: See the new instructions. Changed by: Fransisca Kaufmann Cejay Cambre, MD   chlorpheniramine 4 MG tablet Commonly known as: CHLOR-TRIMETON Take 4 mg by mouth every 4 (four) hours as needed (drippy  nose, drainage).   citalopram 20 MG tablet Commonly known as: CELEXA Take 1 tablet (20 mg total) by mouth daily.   Delsym 30 MG/5ML liquid Generic drug: dextromethorphan Take 30 mg 2 (two) times daily as needed by mouth for cough.   dextromethorphan-guaiFENesin 30-600 MG 12hr tablet Commonly known as: MUCINEX DM Take 1 tablet 2 (two) times daily as needed by mouth for cough.   fenofibrate micronized 134 MG capsule Commonly known as: LOFIBRA TAKE (1) CAPSULE DAILY BEFORE BREAKFAST.   Flutter Devi As needed   ipratropium-albuterol 0.5-2.5 (3) MG/3ML Soln Commonly known as: DUONEB NEBULIZE 1 VIAL EVERY 6 HOURS AS NEEDED   ketorolac 0.5 % ophthalmic solution Commonly known as: ACULAR Place 1 drop into both eyes 4 (four) times daily.   levothyroxine 50 MCG tablet Commonly known as: SYNTHROID Take 1 tablet (50 mcg total) by mouth daily.   loteprednol 0.5 % ophthalmic suspension Commonly known as: LOTEMAX Place 1 drop into both eyes 2 (two) times daily.   meclizine 25 MG tablet Commonly known as: ANTIVERT Take 25 mg every 8 (eight) hours as needed by mouth for dizziness.   memantine 28 MG Cp24 24 hr capsule Commonly known as: NAMENDA XR Take 1 capsule (28 mg total) by mouth daily.   metoprolol tartrate 25 MG tablet Commonly known as: LOPRESSOR TAKE (1/2) TABLET TWICE DAILY.   montelukast 10 MG tablet Commonly known as: SINGULAIR Take 1 tablet (10 mg total) by mouth at bedtime.   omeprazole 20 MG capsule Commonly known as: PRILOSEC TAKE  (1)  CAPSULE  TWICE DAILY (TAKE ON AN EMPTY STOMACH AT LEAST 30 MIN- UTES BEFORE MEALS).   OXYGEN Oxygen 3lpm 24/7, Increase to 4L as needed for shortness of breath with activity   ProAir HFA 108 (90 Base) MCG/ACT inhaler Generic drug: albuterol INHALE 2 PUFFS BY MOUTH EVERY 6 HOURS AS NEEDED   Rhopressa 0.02 % Soln Generic drug: Netarsudil Dimesylate Place 1 drop into both eyes at bedtime.   sodium chloride 0.65 % Soln  nasal spray Commonly known as: OCEAN 2 puffs every 4-6 hours as needed for nasal congestion   traZODone 50 MG tablet Commonly known as: DESYREL Take 0.5-1 tablets (25-50 mg total) by mouth at bedtime as needed for sleep.   valsartan-hydrochlorothiazide 80-12.5 MG tablet Commonly known as: DIOVAN-HCT Take 1 tablet by mouth daily.   Vitamin D 50 MCG (2000 UT) tablet Take 2,000 Units by mouth daily.        Objective:   BP (!) 146/64   Pulse 78   Temp (!) 97 F (36.1 C)   Ht 5\' 5"  (1.651 m)   Wt 130 lb (59 kg)   SpO2 94%   BMI 21.63 kg/m   Wt Readings from Last 3 Encounters:  11/02/19 130 lb (59 kg)  08/08/19 132 lb 3.2 oz (60 kg)  07/28/19 127 lb (57.6 kg)    Physical Exam Vitals and nursing note reviewed.  Constitutional:      General: She is not in acute distress.    Appearance: She is well-developed. She is  not diaphoretic.  Eyes:     Conjunctiva/sclera: Conjunctivae normal.  Cardiovascular:     Rate and Rhythm: Normal rate and regular rhythm.     Heart sounds: Normal heart sounds. No murmur heard.   Pulmonary:     Effort: Pulmonary effort is normal. No respiratory distress.     Breath sounds: Normal breath sounds. No wheezing.  Musculoskeletal:        General: No tenderness. Normal range of motion.  Skin:    General: Skin is warm and dry.     Findings: No rash.  Neurological:     Mental Status: She is alert and oriented to person, place, and time.     Coordination: Coordination normal.  Psychiatric:        Behavior: Behavior normal.     Assessment & Plan:   Problem List Items Addressed This Visit      Cardiovascular and Mediastinum   HTN (hypertension)   Relevant Medications   valsartan-hydrochlorothiazide (DIOVAN-HCT) 80-12.5 MG tablet     Endocrine   Hypothyroidism - Primary   Relevant Medications   levothyroxine (SYNTHROID) 50 MCG tablet   Other Relevant Orders   TSH (Completed)     Other   Insomnia   Relevant Medications    citalopram (CELEXA) 20 MG tablet   ARIPiprazole (ABILIFY) 2 MG tablet   Hyperlipidemia   Relevant Medications   valsartan-hydrochlorothiazide (DIOVAN-HCT) 80-12.5 MG tablet   Depression, recurrent (HCC)   Relevant Medications   citalopram (CELEXA) 20 MG tablet   buPROPion (WELLBUTRIN SR) 200 MG 12 hr tablet   ARIPiprazole (ABILIFY) 2 MG tablet   Anxiety   Relevant Medications   citalopram (CELEXA) 20 MG tablet   buPROPion (WELLBUTRIN SR) 200 MG 12 hr tablet   ARIPiprazole (ABILIFY) 2 MG tablet   Other Relevant Orders   TSH (Completed)    Other Visit Diagnoses    Controlled substance agreement signed       Relevant Medications   citalopram (CELEXA) 20 MG tablet   ARIPiprazole (ABILIFY) 2 MG tablet      Breathing seems to be stable, thyroid seems to be doing well, her anxiety and sleep has been doing better.  She is still on 2 L nasal cannula and seems to be doing well. Follow up plan: Return in about 6 months (around 05/02/2020), or if symptoms worsen or fail to improve, for Follow-up anxiety and thyroid.  Counseling provided for all of the vaccine components Orders Placed This Encounter  Procedures  . TSH    Caryl Pina, MD [facility] 11/02/2019, 11:46 AM

## 2019-11-03 LAB — TSH: TSH: 2.64 u[IU]/mL (ref 0.450–4.500)

## 2019-11-14 DIAGNOSIS — J449 Chronic obstructive pulmonary disease, unspecified: Secondary | ICD-10-CM | POA: Diagnosis not present

## 2019-11-15 ENCOUNTER — Other Ambulatory Visit: Payer: Self-pay | Admitting: *Deleted

## 2019-11-15 ENCOUNTER — Other Ambulatory Visit: Payer: Self-pay | Admitting: Cardiology

## 2019-11-15 ENCOUNTER — Other Ambulatory Visit: Payer: Self-pay | Admitting: Nurse Practitioner

## 2019-11-15 MED ORDER — IPRATROPIUM-ALBUTEROL 0.5-2.5 (3) MG/3ML IN SOLN
3.0000 mL | Freq: Four times a day (QID) | RESPIRATORY_TRACT | 2 refills | Status: DC | PRN
Start: 2019-11-15 — End: 2020-02-07

## 2019-12-08 ENCOUNTER — Encounter (INDEPENDENT_AMBULATORY_CARE_PROVIDER_SITE_OTHER): Payer: Medicare Other | Admitting: Ophthalmology

## 2019-12-14 ENCOUNTER — Other Ambulatory Visit: Payer: Self-pay | Admitting: Nurse Practitioner

## 2019-12-14 DIAGNOSIS — J449 Chronic obstructive pulmonary disease, unspecified: Secondary | ICD-10-CM | POA: Diagnosis not present

## 2019-12-15 ENCOUNTER — Other Ambulatory Visit: Payer: Self-pay | Admitting: Family Medicine

## 2019-12-15 ENCOUNTER — Other Ambulatory Visit: Payer: Self-pay | Admitting: Cardiology

## 2019-12-28 ENCOUNTER — Other Ambulatory Visit: Payer: Self-pay

## 2019-12-28 ENCOUNTER — Encounter (INDEPENDENT_AMBULATORY_CARE_PROVIDER_SITE_OTHER): Payer: Medicare Other | Admitting: Ophthalmology

## 2019-12-28 ENCOUNTER — Ambulatory Visit: Payer: Medicare Other

## 2019-12-28 DIAGNOSIS — I10 Essential (primary) hypertension: Secondary | ICD-10-CM

## 2019-12-28 DIAGNOSIS — H43813 Vitreous degeneration, bilateral: Secondary | ICD-10-CM

## 2019-12-28 DIAGNOSIS — H353231 Exudative age-related macular degeneration, bilateral, with active choroidal neovascularization: Secondary | ICD-10-CM | POA: Diagnosis not present

## 2019-12-28 DIAGNOSIS — H35033 Hypertensive retinopathy, bilateral: Secondary | ICD-10-CM

## 2020-01-12 ENCOUNTER — Other Ambulatory Visit: Payer: Self-pay | Admitting: Nurse Practitioner

## 2020-01-14 DIAGNOSIS — J449 Chronic obstructive pulmonary disease, unspecified: Secondary | ICD-10-CM | POA: Diagnosis not present

## 2020-02-07 ENCOUNTER — Other Ambulatory Visit: Payer: Self-pay | Admitting: Family Medicine

## 2020-02-08 ENCOUNTER — Ambulatory Visit: Payer: Medicare Other | Admitting: Internal Medicine

## 2020-02-08 ENCOUNTER — Other Ambulatory Visit: Payer: Self-pay

## 2020-02-08 ENCOUNTER — Encounter: Payer: Self-pay | Admitting: Internal Medicine

## 2020-02-08 DIAGNOSIS — J449 Chronic obstructive pulmonary disease, unspecified: Secondary | ICD-10-CM | POA: Diagnosis not present

## 2020-02-08 DIAGNOSIS — J9612 Chronic respiratory failure with hypercapnia: Secondary | ICD-10-CM

## 2020-02-08 DIAGNOSIS — R058 Other specified cough: Secondary | ICD-10-CM | POA: Diagnosis not present

## 2020-02-08 NOTE — Assessment & Plan Note (Signed)
Onset x 08/2019 assoc with pnds >  02/08/2020 rec 1st gen H1 blockers per guidelines    Can also use delsym prn cough at hs and continue max gerd rx per med calendar  >>>    Each maintenance medication was reviewed in detail including most importantly the difference between maintenance and as needed and under what circumstances the prns are to be used. This was done in the context of a medication calendar review which provided the patient with a user-friendly unambiguous mechanism for medication administration and reconciliation and provides an action plan for all active problems. It is critical that this be shown to every doctor  for modification during the office visit if necessary so the patient can use it as a working document.           Each maintenance medication was reviewed in detail including emphasizing most importantly the difference between maintenance and prns and under what circumstances the prns are to be triggered using an action plan format where appropriate.  Total time for H and P, chart review, counseling,  and generating customized AVS unique to this office visit / same day charting = 20 min

## 2020-02-08 NOTE — Assessment & Plan Note (Signed)
Quit smoking 1992    - PFT's 06/11/2007  FEV1   0.70 (34%) ratio 32 with 49% improvement p B2    - HFA 75% effective p coaching 06/20/2011 > 50% 06/24/2011  -Med calendar 09/27/2013 > did not bring as requested 06/09/14 , 06/05/2015 redone  - improved reconciliation 09/07/2014 and 03/07/2015 and 10/16/2015   - 10/05/2017  After extensive coaching inhaler device,  effectiveness =    50%    Group D in terms of symptom/risk and laba/lama/ICS  therefore appropriate rx at this point >>>  Continue duoneb and budesonide as can't afford the lama/laba

## 2020-02-08 NOTE — Progress Notes (Signed)
Subjective:     Patient ID: Katie Bryant, female   DOB: 05-11-1928    MRN: [id number]   Brief patient profile:  40 yowf quit smoking 1992 with GOLD III copd dx 2009 eval in pulm clinic p hospitalized at [facility] 12/21/10   History of Present Illness   10/06/2018 Katie Bryant  OV > med calendar   02/07/2019  f/u ov/Katie Bryant re: GOLD III spirometry but 02 dep 24/7 / following med calendar well/ living with daughter Chief Complaint  Patient presents with  . Follow-up    Breathing is about the same. She has cough when she lies down- non prod. She is using her proair only when she leaves her house- not leaving much.   Dyspnea:  Around the house = MMRC3 = can'Katie walk 100 yards even at a slow pace at a flat grade s stopping due to sob Cough: only hs and  Sleeping: flat/ two pillows  SABA use: rarely 02:  4lpm concentrator and 2lpm POC rec  No change in medications Adjust 02 during the day to keep sats above 92% - no need to push higher See calendar for specific medication instructions   08/08/2019  f/u ov/Katie Bryant re:  GOLD  III/ 02 dep  Flare 07/28/19 rx prednisone  Chief Complaint  Patient presents with  . Follow-up    wheezing and coughing  Dyspnea:  Room to room on rollator 3lpm  Cough: nothing unusual Sleeping: flat bed / 2 pillows  SABA use none 02: sleeps on 3.5 / sitting same then with POC 2 sitting up 3 walkig/  rec    02/08/2020  f/u ov/Katie Bryant re: GOLD III/ 0 2 dep / rollator  Chief Complaint  Patient presents with  . Follow-up    Breathing is doing well overall. She rarely uses her albuterol inhaler.    Dyspnea:  Room to room on 4lpm high 80s Cough: mostly at night x 6 m x 20 min and better p delsym then sleeps fine, assoc sense of pnds/ clear/ not using 1st gen H1 blockers per guidelines  As per med calendar  Sleeping: bed is flat/ 2pillows / on side  SABA use: rarely 02: 4lpm 24/7   No obvious day to day or daytime variability or assoc excess/ purulent sputum or mucus plugs or  hemoptysis or cp or chest tightness, subjective wheeze or overt sinus or hb symptoms.   Once asleep does fine  without nocturnal  or early am exacerbation  of respiratory  c/o's or need for noct saba. Also denies any obvious fluctuation of symptoms with weather or environmental changes or other aggravating or alleviating factors except as outlined above   No unusual exposure hx or h/o childhood pna/ asthma or knowledge of premature birth.  Current Allergies, Complete Past Medical History, Past Surgical History, Family History, and Social History were reviewed in [employer] record.  ROS  The following are not active complaints unless bolded Hoarseness, sore throat, dysphagia, dental problems, itching, sneezing,  nasal congestion or discharge of excess mucus or purulent secretions, ear ache,   fever, chills, sweats, unintended wt loss or wt gain, classically pleuritic or exertional cp,  orthopnea pnd or arm/hand swelling  or leg swelling, presyncope, palpitations, abdominal pain, anorexia, nausea, vomiting, diarrhea  or change in bowel habits or change in bladder habits, change in stools or change in urine, dysuria, hematuria,  rash, arthralgias, visual complaints, headache, numbness, weakness or ataxia or problems with walking or coordination,  change in  mood or  memory.        Current Meds  Medication Sig  . acetaminophen (TYLENOL) 500 MG tablet Take 500 mg by mouth every 6 (six) hours as needed (pain).   . ARIPiprazole (ABILIFY) 2 MG tablet Take 1 tablet (2 mg total) by mouth daily.  Marland Kitchen aspirin 81 MG tablet Take 81 mg by mouth daily.  Marland Kitchen atorvastatin (LIPITOR) 40 MG tablet TAKE 1 TABLET DAILY  . betaxolol (BETOPTIC-S) 0.25 % ophthalmic suspension Place 1 drop into both eyes every morning.  . brimonidine (ALPHAGAN) 0.2 % ophthalmic solution Place 1 drop into both eyes daily.  . budesonide (PULMICORT) 0.5 MG/2ML nebulizer solution NEBULIZE 1 VIAL TWICE A DAY  . buPROPion  (WELLBUTRIN SR) 200 MG 12 hr tablet Take 1 tablet (200 mg total) by mouth 2 (two) times daily.  . chlorpheniramine (CHLOR-TRIMETON) 4 MG tablet Take 4 mg by mouth every 4 (four) hours as needed (drippy nose, drainage).  . Cholecalciferol (VITAMIN D) 50 MCG (2000 UT) tablet Take 2,000 Units by mouth daily.  . citalopram (CELEXA) 20 MG tablet Take 1 tablet (20 mg total) by mouth daily.  Marland Kitchen dextromethorphan (DELSYM) 30 MG/5ML liquid Take 30 mg 2 (two) times daily as needed by mouth for cough.  . dextromethorphan-guaiFENesin (MUCINEX DM) 30-600 MG 12hr tablet Take 1 tablet 2 (two) times daily as needed by mouth for cough.  . fenofibrate micronized (LOFIBRA) 134 MG capsule TAKE (1) CAPSULE DAILY BEFORE BREAKFAST.  Marland Kitchen ipratropium-albuterol (DUONEB) 0.5-2.5 (3) MG/3ML SOLN NEBULIZE 1 VIAL EVERY 6 HOURS AS NEEDED  . ketorolac (ACULAR) 0.5 % ophthalmic solution Place 1 drop into both eyes 4 (four) times daily.  Marland Kitchen levothyroxine (SYNTHROID) 50 MCG tablet Take 1 tablet (50 mcg total) by mouth daily.  . meclizine (ANTIVERT) 25 MG tablet Take 25 mg every 8 (eight) hours as needed by mouth for dizziness.  . memantine (NAMENDA XR) 28 MG CP24 24 hr capsule Take 1 capsule (28 mg total) by mouth daily.  . metoprolol tartrate (LOPRESSOR) 25 MG tablet TAKE (1/2) TABLET TWICE DAILY.  . montelukast (SINGULAIR) 10 MG tablet Take 1 tablet (10 mg total) by mouth at bedtime.  Marland Kitchen omeprazole (PRILOSEC) 20 MG capsule TAKE  (1)  CAPSULE  TWICE DAILY (TAKE ON AN EMPTY STOMACH AT LEAST 30 MIN- UTES BEFORE MEALS).  . OXYGEN Oxygen 3lpm 24/7, Increase to 4L as needed for shortness of breath with activity  . PROAIR HFA 108 (90 Base) MCG/ACT inhaler INHALE 2 PUFFS BY MOUTH EVERY 6 HOURS AS NEEDED  . Respiratory Therapy Supplies (FLUTTER) DEVI As needed  . RHOPRESSA 0.02 % SOLN Place 1 drop into both eyes at bedtime.  . sodium chloride (OCEAN) 0.65 % SOLN nasal spray 2 puffs every 4-6 hours as needed for nasal congestion  . traZODone  (DESYREL) 50 MG tablet TAKE 1/2 TO 1 TABLET AT BEDTIME AS NEEDED FOR SLEEP  . valsartan-hydrochlorothiazide (DIOVAN-HCT) 80-12.5 MG tablet Take 1 tablet by mouth daily.                  Objective:  Physical Exam    02/08/2020      135  08/08/2019      132 02/07/2019     133   07/06/2018  141   10/05/2017   149  03/19/2017   146 >   Wt 184 01/21/2011  >08/27/2011 186 > 180 09/29/2011 > 10/07/2011  183 > 11/11/2011  181 > 01/13/2012  183 >180 02/12/2012 > 06/09/2012 188 >  189 10/12/2012 >188 01/14/2013 > 01/21/2013 187 > 03/02/2013 >184 03/18/2013 >  03/28/2013 186> 06/06/2013  182 >  07/18/2013  179 >181 09/27/2013 > 12/08/2013   183 > 02/24/2014  178 > 06/09/2014 177 > 09/07/2014 175 > 12/05/2014   173 > 03/06/2015 173>  10/16/2015   171 >  04/28/2016  167 > 11/04/2016  155 >      Vital signs reviewed  02/08/2020  - Note at rest 02 sats  100% on 3lpm pulsed  General appearance:    Pleasant elderly wf nad  Using rollator   HEENT : pt wearing mask not removed for exam due to covid -19 concerns.    NECK :  without JVD/Nodes/TM/ nl carotid upstrokes bilaterally   LUNGS: no acc muscle use,  Mod barrel  contour chest wall with bilateral  Distant bs s audible wheeze and  without cough on insp or exp maneuvers and mod  Hyperresonant  to  percussion bilaterally     CV:  RRR  no s3 or 2/6 sem s  increase in P2, and no edema   ABD:  soft and nontender with pos mid insp Hoover's  in the supine position. No bruits or organomegaly appreciated, bowel sounds nl  MS:     ext warm without deformities, calf tenderness, cyanosis or clubbing No obvious joint restrictions   SKIN: warm and dry without lesions    NEURO:  alert, approp, nl sensorium with  no motor or cerebellar deficits apparent.

## 2020-02-08 NOTE — Patient Instructions (Signed)
No change in medications  See calendar for specific medication instructions and bring it back for each and every office visit for every healthcare provider you see.  Without it,  you may not receive the best quality medical care that we feel you deserve.  You will note that the calendar groups together  your maintenance  medications that are timed at particular times of the day.  Think of this as your checklist for what your doctor has instructed you to do until your next evaluation to see what benefit  there is  to staying on a consistent group of medications intended to keep you well.  The other group at the bottom is entirely up to you to use as you see fit  for specific symptoms that may arise between visits that require you to treat them on an as needed basis.  Think of this as your action plan or "what if" list.   Separating the top medications from the bottom group is fundamental to providing you adequate care going forward.     Please schedule a follow up visit in 6  months but call sooner if needed  

## 2020-02-08 NOTE — Assessment & Plan Note (Addendum)
Dr Annamaria Boots started 02 around 2010    - 3 lpm 24/7 except 4lpm with activity as of 03/02/2013     - HC03 trending in low 30's since 2012 so likely hypercarbic component as well   - 02/24/2014   Walked RA x one lap @ 185 stopped due to  desat to 83% corrected on 4lpm    - 12/05/2014   Walked 3lpm pulsed x 2 laps @ 185 ft each stopped due to sob with sat 89% at moderate pace    - 03/06/2015   Walked 4lpm 2 laps @ 185 ft each stopped due to  Sob/ desat to 86% at pace faster than her nl  - 11/04/2016  HCO3  =32  -  12/08/2016  Apri notified "does not qualify for POC "  - not clear whether titration attempted.     rx as of 02/08/2020  =4 lpm 24/7  And titrate to keep > 90% with activity     Each maintenance medication was reviewed in detail including most importantly the difference between maintenance and as needed and under what circumstances the prns are to be used. This was done in the context of a medication calendar review which provided the patient with a user-friendly unambiguous mechanism for medication administration and reconciliation and provides an action plan for all active problems. It is critical that this be shown to every doctor  for modification during the office visit if necessary so the patient can use it as a working document.             Each maintenance medication was reviewed in detail including emphasizing most importantly the difference between maintenance and prns and under what circumstances the prns are to be triggered using an action plan format where appropriate.  Total time for H and P, chart review, counseling, reviewing 02/ neb  device(s) and generating customized AVS unique to this office visit / same day charting = 20 min

## 2020-02-13 ENCOUNTER — Other Ambulatory Visit: Payer: Self-pay | Admitting: Cardiology

## 2020-02-14 DIAGNOSIS — J449 Chronic obstructive pulmonary disease, unspecified: Secondary | ICD-10-CM | POA: Diagnosis not present

## 2020-03-05 ENCOUNTER — Other Ambulatory Visit: Payer: Self-pay | Admitting: Family Medicine

## 2020-03-09 ENCOUNTER — Telehealth: Payer: Self-pay | Admitting: Internal Medicine

## 2020-03-09 MED ORDER — IPRATROPIUM-ALBUTEROL 0.5-2.5 (3) MG/3ML IN SOLN: RESPIRATORY_TRACT | 1 refills | Status: DC

## 2020-03-09 NOTE — Telephone Encounter (Signed)
Refill has been sent to the pharmacy and nothing further is needed.  

## 2020-03-10 ENCOUNTER — Other Ambulatory Visit: Payer: Self-pay | Admitting: Cardiology

## 2020-03-13 DIAGNOSIS — J449 Chronic obstructive pulmonary disease, unspecified: Secondary | ICD-10-CM | POA: Diagnosis not present

## 2020-03-14 DIAGNOSIS — H02132 Senile ectropion of right lower eyelid: Secondary | ICD-10-CM | POA: Diagnosis not present

## 2020-03-14 DIAGNOSIS — H40013 Open angle with borderline findings, low risk, bilateral: Secondary | ICD-10-CM | POA: Diagnosis not present

## 2020-03-14 DIAGNOSIS — H353231 Exudative age-related macular degeneration, bilateral, with active choroidal neovascularization: Secondary | ICD-10-CM | POA: Diagnosis not present

## 2020-03-14 DIAGNOSIS — H02135 Senile ectropion of left lower eyelid: Secondary | ICD-10-CM | POA: Diagnosis not present

## 2020-03-14 DIAGNOSIS — Z961 Presence of intraocular lens: Secondary | ICD-10-CM | POA: Diagnosis not present

## 2020-03-21 ENCOUNTER — Encounter (INDEPENDENT_AMBULATORY_CARE_PROVIDER_SITE_OTHER): Payer: Medicare Other | Admitting: Ophthalmology

## 2020-03-21 ENCOUNTER — Other Ambulatory Visit: Payer: Self-pay

## 2020-03-21 DIAGNOSIS — H353231 Exudative age-related macular degeneration, bilateral, with active choroidal neovascularization: Secondary | ICD-10-CM | POA: Diagnosis not present

## 2020-03-21 DIAGNOSIS — I10 Essential (primary) hypertension: Secondary | ICD-10-CM

## 2020-03-21 DIAGNOSIS — H35033 Hypertensive retinopathy, bilateral: Secondary | ICD-10-CM | POA: Diagnosis not present

## 2020-03-21 DIAGNOSIS — H43813 Vitreous degeneration, bilateral: Secondary | ICD-10-CM

## 2020-04-13 DIAGNOSIS — J449 Chronic obstructive pulmonary disease, unspecified: Secondary | ICD-10-CM | POA: Diagnosis not present

## 2020-04-18 ENCOUNTER — Other Ambulatory Visit: Payer: Self-pay | Admitting: Family Medicine

## 2020-04-19 ENCOUNTER — Other Ambulatory Visit: Payer: Self-pay | Admitting: Internal Medicine

## 2020-05-02 ENCOUNTER — Ambulatory Visit (INDEPENDENT_AMBULATORY_CARE_PROVIDER_SITE_OTHER): Payer: Medicare Other | Admitting: Family Medicine

## 2020-05-02 ENCOUNTER — Other Ambulatory Visit: Payer: Self-pay

## 2020-05-02 ENCOUNTER — Encounter: Payer: Self-pay | Admitting: Family Medicine

## 2020-05-02 VITALS — BP 142/57 | HR 84 | Ht 65.0 in | Wt 133.0 lb

## 2020-05-02 DIAGNOSIS — E782 Mixed hyperlipidemia: Secondary | ICD-10-CM

## 2020-05-02 DIAGNOSIS — E039 Hypothyroidism, unspecified: Secondary | ICD-10-CM | POA: Diagnosis not present

## 2020-05-02 DIAGNOSIS — I10 Essential (primary) hypertension: Secondary | ICD-10-CM | POA: Diagnosis not present

## 2020-05-02 DIAGNOSIS — J449 Chronic obstructive pulmonary disease, unspecified: Secondary | ICD-10-CM

## 2020-05-02 MED ORDER — TRAZODONE HCL 50 MG PO TABS: ORAL_TABLET | ORAL | 3 refills | Status: DC

## 2020-05-02 MED ORDER — ATORVASTATIN CALCIUM 40 MG PO TABS: 1.0000 | ORAL_TABLET | Freq: Every day | ORAL | 3 refills | Status: DC

## 2020-05-02 NOTE — Progress Notes (Signed)
BP (!) 142/57   Pulse 84   Ht 5' 5"  (1.651 m)   Wt 133 lb (60.3 kg)   SpO2 97%   BMI 22.13 kg/m    Subjective:   Patient ID: Katie Bryant, female    DOB: [birth year], 87 y.o.   MRN: [id number]  HPI: Katie Bryant is a 87 y.o. female presenting on 05/02/2020 for Medical Management of Chronic Issues, Hypothyroidism, Anxiety, Depression, and Insomnia   HPI Depression and anxiety and insomnia recheck. Patient is coming in for recheck of depression and anxiety and insomnia.  She currently taking Wellbutrin and Celexa and Abilify and trazodone for sleep.  Hypothyroidism recheck Patient is coming in for thyroid recheck today as well. They deny any issues with hair changes or heat or cold problems or diarrhea or constipation. They deny any chest pain or palpitations. They are currently on levothyroxine 81mcrograms   COPD Patient is coming in for COPD recheck today.  He is currently on Pulmicort and duo nebs and oxygen 4 L, sees pulmonology.  He has a mild chronic cough but denies any major coughing spells or wheezing spells.  He has 7nighttime symptoms per week and 7daytime symptoms per week currently.  Lungs are worsening along with her memory, discussed that it is probably getting closer to end-of-life.  Daughter understands.  Relevant past medical, surgical, family and social history reviewed and updated as indicated. Interim medical history since our last visit reviewed. Allergies and medications reviewed and updated.  Review of Systems  Constitutional: Negative for chills and fever.  Eyes: Negative for visual disturbance.  Respiratory: Positive for cough, shortness of breath and wheezing. Negative for chest tightness.   Cardiovascular: Negative for chest pain and leg swelling.  Musculoskeletal: Negative for back pain and gait problem.  Skin: Negative for rash.  Neurological: Negative for light-headedness and headaches.  Psychiatric/Behavioral: Positive for confusion and  decreased concentration. Negative for agitation, behavioral problems, dysphoric mood, self-injury, sleep disturbance and suicidal ideas. The patient is not nervous/anxious.   All other systems reviewed and are negative.   Per HPI unless specifically indicated above   Allergies as of 05/02/2020   No Known Allergies     Medication List       Accurate as of May 02, 2020 11:30 AM. If you have any questions, ask your nurse or doctor.        acetaminophen 500 MG tablet Commonly known as: TYLENOL Take 500 mg by mouth every 6 (six) hours as needed (pain).   ARIPiprazole 2 MG tablet Commonly known as: ABILIFY Take 1 tablet (2 mg total) by mouth daily.   aspirin 81 MG tablet Take 81 mg by mouth daily.   atorvastatin 40 MG tablet Commonly known as: LIPITOR TAKE 1 TABLET DAILY   betaxolol 0.25 % ophthalmic suspension Commonly known as: BETOPTIC-S Place 1 drop into both eyes every morning.   brimonidine 0.2 % ophthalmic solution Commonly known as: ALPHAGAN Place 1 drop into both eyes daily.   budesonide 0.5 MG/2ML nebulizer solution Commonly known as: PULMICORT NEBULIZE 1 VIAL TWICE A DAY   buPROPion 200 MG 12 hr tablet Commonly known as: WELLBUTRIN SR Take 1 tablet (200 mg total) by mouth 2 (two) times daily.   chlorpheniramine 4 MG tablet Commonly known as: CHLOR-TRIMETON Take 4 mg by mouth every 4 (four) hours as needed (drippy nose, drainage).   citalopram 20 MG tablet Commonly known as: CELEXA Take 1 tablet (20 mg total) by mouth daily.  dextromethorphan 30 MG/5ML liquid Commonly known as: DELSYM Take 30 mg 2 (two) times daily as needed by mouth for cough.   dextromethorphan-guaiFENesin 30-600 MG 12hr tablet Commonly known as: MUCINEX DM Take 1 tablet 2 (two) times daily as needed by mouth for cough.   fenofibrate micronized 134 MG capsule Commonly known as: LOFIBRA TAKE (1) CAPSULE DAILY BEFORE BREAKFAST.   Flutter Devi As needed    ipratropium-albuterol 0.5-2.5 (3) MG/3ML Soln Commonly known as: DUONEB NEBULIZE 1 VIAL EVERY 6 HOURS AS NEEDED   ketorolac 0.5 % ophthalmic solution Commonly known as: ACULAR Place 1 drop into both eyes 4 (four) times daily.   levothyroxine 50 MCG tablet Commonly known as: SYNTHROID Take 1 tablet (50 mcg total) by mouth daily.   meclizine 25 MG tablet Commonly known as: ANTIVERT Take 25 mg every 8 (eight) hours as needed by mouth for dizziness.   memantine 28 MG Cp24 24 hr capsule Commonly known as: NAMENDA XR Take 1 capsule (28 mg total) by mouth daily.   metoprolol tartrate 25 MG tablet Commonly known as: LOPRESSOR TAKE (1/2) TABLET TWICE DAILY.   montelukast 10 MG tablet Commonly known as: SINGULAIR Take 1 tablet (10 mg total) by mouth at bedtime.   omeprazole 20 MG capsule Commonly known as: PRILOSEC TAKE  (1)  CAPSULE  TWICE DAILY (TAKE ON AN EMPTY STOMACH AT LEAST 30 MIN- UTES BEFORE MEALS).   OXYGEN Oxygen 3lpm 24/7, Increase to 4L as needed for shortness of breath with activity   ProAir HFA 108 (90 Base) MCG/ACT inhaler Generic drug: albuterol INHALE 2 PUFFS BY MOUTH EVERY 6 HOURS AS NEEDED   Rhopressa 0.02 % Soln Generic drug: Netarsudil Dimesylate Place 1 drop into both eyes at bedtime.   sodium chloride 0.65 % Soln nasal spray Commonly known as: OCEAN 2 puffs every 4-6 hours as needed for nasal congestion   traZODone 50 MG tablet Commonly known as: DESYREL TAKE 1/2 TO 1 TABLET AT BEDTIME AS NEEDED FOR SLEEP   valsartan-hydrochlorothiazide 80-12.5 MG tablet Commonly known as: DIOVAN-HCT Take 1 tablet by mouth daily.   Vitamin D 50 MCG (2000 UT) tablet Take 2,000 Units by mouth daily.        Objective:   BP (!) 142/57   Pulse 84   Ht 5' 5"  (1.651 m)   Wt 133 lb (60.3 kg)   SpO2 97%   BMI 22.13 kg/m   Wt Readings from Last 3 Encounters:  05/02/20 133 lb (60.3 kg)  02/08/20 135 lb 12.8 oz (61.6 kg)  11/02/19 130 lb (59 kg)     Physical Exam Vitals and nursing note reviewed.  Constitutional:      General: She is not in acute distress.    Appearance: She is well-developed. She is not diaphoretic.  Eyes:     Conjunctiva/sclera: Conjunctivae normal.  Cardiovascular:     Rate and Rhythm: Normal rate and regular rhythm.     Heart sounds: Normal heart sounds. No murmur heard.   Pulmonary:     Effort: Pulmonary effort is normal. No respiratory distress.     Breath sounds: Normal breath sounds. No wheezing.  Musculoskeletal:        General: No tenderness. Normal range of motion.  Skin:    General: Skin is warm and dry.     Findings: No rash.  Neurological:     Mental Status: She is alert and oriented to person, place, and time.     Coordination: Coordination normal.  Psychiatric:  Behavior: Behavior normal.       Assessment & Plan:   Problem List Items Addressed This Visit      Cardiovascular and Mediastinum   HTN (hypertension)   Relevant Medications   atorvastatin (LIPITOR) 40 MG tablet   Other Relevant Orders   CMP14+EGFR     Respiratory   COPD GOLD III/ 02 dep    Relevant Orders   CBC with Differential/Platelet     Endocrine   Hypothyroidism - Primary   Relevant Orders   CBC with Differential/Platelet   CMP14+EGFR   TSH     Other   Hyperlipidemia   Relevant Medications   atorvastatin (LIPITOR) 40 MG tablet   Other Relevant Orders   CMP14+EGFR   Lipid panel      Continue current medication.  We will check blood work.  Discussed memory and end-of-life care with family. Follow up plan: Return in about 6 months (around 11/01/2020), or if symptoms worsen or fail to improve, for Thyroid recheck.  Counseling provided for all of the vaccine components No orders of the defined types were placed in this encounter.   Caryl Pina, MD [facility] 05/02/2020, 11:30 AM

## 2020-05-03 LAB — CBC WITH DIFFERENTIAL/PLATELET
Basophils Absolute: 0.1 10*3/uL (ref 0.0–0.2)
Basos: 1 %
EOS (ABSOLUTE): 0.2 10*3/uL (ref 0.0–0.4)
Eos: 2 %
Hematocrit: 31.9 % — ABNORMAL LOW (ref 34.0–46.6)
Hemoglobin: 10.8 g/dL — ABNORMAL LOW (ref 11.1–15.9)
Immature Grans (Abs): 0 10*3/uL (ref 0.0–0.1)
Immature Granulocytes: 1 %
Lymphocytes Absolute: 1.7 10*3/uL (ref 0.7–3.1)
Lymphs: 20 %
MCH: 28.7 pg (ref 26.6–33.0)
MCHC: 33.9 g/dL (ref 31.5–35.7)
MCV: 85 fL (ref 79–97)
Monocytes Absolute: 0.7 10*3/uL (ref 0.1–0.9)
Monocytes: 8 %
Neutrophils Absolute: 5.9 10*3/uL (ref 1.4–7.0)
Neutrophils: 68 %
Platelets: 253 10*3/uL (ref 150–450)
RBC: 3.76 x10E6/uL — ABNORMAL LOW (ref 3.77–5.28)
RDW: 11.8 % (ref 11.7–15.4)
WBC: 8.6 10*3/uL (ref 3.4–10.8)

## 2020-05-03 LAB — LIPID PANEL
Chol/HDL Ratio: 2 ratio (ref 0.0–4.4)
Cholesterol, Total: 171 mg/dL (ref 100–199)
HDL: 87 mg/dL (ref >39)
LDL Chol Calc (NIH): 71 mg/dL (ref 0–99)
Triglycerides: 69 mg/dL (ref 0–149)
VLDL Cholesterol Cal: 13 mg/dL (ref 5–40)

## 2020-05-03 LAB — CMP14+EGFR
ALT: 18 IU/L (ref 0–32)
AST: 26 IU/L (ref 0–40)
Albumin/Globulin Ratio: 1.5 (ref 1.2–2.2)
Albumin: 4 g/dL (ref 3.6–4.6)
Alkaline Phosphatase: 39 IU/L — ABNORMAL LOW (ref 44–121)
BUN/Creatinine Ratio: 23 (ref 12–28)
BUN: 24 mg/dL (ref 8–27)
Bilirubin Total: 0.3 mg/dL (ref 0.0–1.2)
CO2: 30 mmol/L — ABNORMAL HIGH (ref 20–29)
Calcium: 9.6 mg/dL (ref 8.7–10.3)
Chloride: 97 mmol/L (ref 96–106)
Creatinine, Ser: 1.05 mg/dL — ABNORMAL HIGH (ref 0.57–1.00)
Globulin, Total: 2.7 g/dL (ref 1.5–4.5)
Glucose: 88 mg/dL (ref 65–99)
Potassium: 4.5 mmol/L (ref 3.5–5.2)
Sodium: 136 mmol/L (ref 134–144)
Total Protein: 6.7 g/dL (ref 6.0–8.5)
eGFR: 51 mL/min/{1.73_m2} — ABNORMAL LOW (ref >59)

## 2020-05-03 LAB — TSH: TSH: 3.05 u[IU]/mL (ref 0.450–4.500)

## 2020-05-13 DIAGNOSIS — J449 Chronic obstructive pulmonary disease, unspecified: Secondary | ICD-10-CM | POA: Diagnosis not present

## 2020-05-15 ENCOUNTER — Other Ambulatory Visit: Payer: Self-pay | Admitting: Internal Medicine

## 2020-05-16 ENCOUNTER — Telehealth: Payer: Self-pay | Admitting: Internal Medicine

## 2020-05-16 ENCOUNTER — Other Ambulatory Visit: Payer: Self-pay

## 2020-05-16 MED ORDER — IPRATROPIUM-ALBUTEROL 0.5-2.5 (3) MG/3ML IN SOLN: RESPIRATORY_TRACT | 6 refills | Status: DC

## 2020-05-16 NOTE — Telephone Encounter (Signed)
Spoke with the pharmacy and they said that a request was sent over to the office but they did not hear back about this. RX for the duoneb has been sent to the pharmacy and nothing further is needed.

## 2020-06-11 DIAGNOSIS — D045 Carcinoma in situ of skin of trunk: Secondary | ICD-10-CM | POA: Diagnosis not present

## 2020-06-11 DIAGNOSIS — C44329 Squamous cell carcinoma of skin of other parts of face: Secondary | ICD-10-CM | POA: Diagnosis not present

## 2020-06-11 DIAGNOSIS — C44729 Squamous cell carcinoma of skin of left lower limb, including hip: Secondary | ICD-10-CM | POA: Diagnosis not present

## 2020-06-13 DIAGNOSIS — J449 Chronic obstructive pulmonary disease, unspecified: Secondary | ICD-10-CM | POA: Diagnosis not present

## 2020-06-20 ENCOUNTER — Other Ambulatory Visit: Payer: Self-pay

## 2020-06-20 ENCOUNTER — Encounter (INDEPENDENT_AMBULATORY_CARE_PROVIDER_SITE_OTHER): Payer: Medicare Other | Admitting: Ophthalmology

## 2020-06-20 DIAGNOSIS — H43813 Vitreous degeneration, bilateral: Secondary | ICD-10-CM

## 2020-06-20 DIAGNOSIS — I10 Essential (primary) hypertension: Secondary | ICD-10-CM | POA: Diagnosis not present

## 2020-06-20 DIAGNOSIS — H35033 Hypertensive retinopathy, bilateral: Secondary | ICD-10-CM | POA: Diagnosis not present

## 2020-06-20 DIAGNOSIS — H353231 Exudative age-related macular degeneration, bilateral, with active choroidal neovascularization: Secondary | ICD-10-CM

## 2020-06-26 ENCOUNTER — Other Ambulatory Visit: Payer: Self-pay | Admitting: Cardiology

## 2020-07-13 DIAGNOSIS — J449 Chronic obstructive pulmonary disease, unspecified: Secondary | ICD-10-CM | POA: Diagnosis not present

## 2020-08-08 ENCOUNTER — Other Ambulatory Visit: Payer: Self-pay

## 2020-08-08 ENCOUNTER — Ambulatory Visit: Payer: Medicare Other | Admitting: Internal Medicine

## 2020-08-08 ENCOUNTER — Encounter: Payer: Self-pay | Admitting: Internal Medicine

## 2020-08-08 DIAGNOSIS — J449 Chronic obstructive pulmonary disease, unspecified: Secondary | ICD-10-CM | POA: Diagnosis not present

## 2020-08-08 DIAGNOSIS — J9612 Chronic respiratory failure with hypercapnia: Secondary | ICD-10-CM

## 2020-08-08 NOTE — Assessment & Plan Note (Signed)
Dr Annamaria Boots started 02 around 2010    - 3 lpm 24/7 except 4lpm with activity as of 03/02/2013     - HC03 trending in low 30's since 2012 so likely hypercarbic component as well   - 02/24/2014   Walked RA x one lap @ 185 stopped due to  desat to 83% corrected on 4lpm    - 12/05/2014   Walked 3lpm pulsed x 2 laps @ 185 ft each stopped due to sob with sat 89% at moderate pace    - 03/06/2015   Walked 4lpm 2 laps @ 185 ft each stopped due to  Sob/ desat to 86% at pace faster than her nl  - 11/04/2016  HCO3  =32  -  12/08/2016  Apri notified "does not qualify for POC "  - not clear whether titration attempted.     rx as of 08/08/2020  =4 lpm 24/7  And titrate to keep > 90% with activity         Each maintenance medication was reviewed in detail including emphasizing most importantly the difference between maintenance and prns and under what circumstances the prns are to be triggered using an action plan format where appropriate.  Total time for H and P, chart review, counseling, reviewing duoneb/ 02 device(s) and generating customized AVS unique to this office visit / same day charting = 22 min

## 2020-08-08 NOTE — Progress Notes (Signed)
Subjective:     Patient ID: Katie Bryant, female   DOB: 1928-07-12    MRN: [id number]   Brief patient profile:  85  yowf quit smoking 1992 with GOLD III copd dx 2009 eval in pulm clinic p hospitalized at [facility] 12/21/10   History of Present Illness     02/07/2019  f/u ov/Katie Bryant re: GOLD III spirometry but 02 dep 24/7 / following med calendar well/ living with daughter Chief Complaint  Patient presents with   Follow-up    Breathing is about the same. She has cough when she lies down- non prod. She is using her proair only when she leaves her house- not leaving much.   Dyspnea:  Around the house = MMRC3 = can't walk 100 yards even at a slow pace at a flat grade s stopping due to sob Cough: only hs and  Sleeping: flat/ two pillows  SABA use: rarely 02:  4lpm concentrator and 2lpm POC rec  No change in medications Adjust 02 during the day to keep sats above 92% - no need to push higher See calendar for specific medication instructions    08/08/2020  f/u ov/Katie Bryant re:  GOLD III 02 dep/ rollator maint on duoneb/bud/02  Chief Complaint  Patient presents with   Follow-up  Dyspnea:  walking around house with rollator  Cough: minimal dry worse hs x years  Sleeping: she feels she sleeps fine - bed is flat with 2-3 pillows  SABA use: not needing  02: 4lpm POC / 4lpm  Covid status:   vax x 3    No obvious day to day or daytime variability or assoc excess/ purulent sputum or mucus plugs or hemoptysis or cp or chest tightness, subjective wheeze or overt sinus or hb symptoms.    Also denies any obvious fluctuation of symptoms with weather or environmental changes or other aggravating or alleviating factors except as outlined above   No unusual exposure hx or h/o childhood pna/ asthma or knowledge of premature birth.  Current Allergies, Complete Past Medical History, Past Surgical History, Family History, and Social History were reviewed in [employer] record.  ROS  The  following are not active complaints unless bolded Hoarseness, sore throat, dysphagia, dental problems, itching, sneezing,  nasal congestion or discharge of excess mucus or purulent secretions, ear ache,   fever, chills, sweats, unintended wt loss or wt gain, classically pleuritic or exertional cp,  orthopnea pnd or arm/hand swelling  or leg swelling, presyncope, palpitations, abdominal pain, anorexia, nausea, vomiting, diarrhea  or change in bowel habits or change in bladder habits, change in stools or change in urine, dysuria, hematuria,  rash, arthralgias, visual complaints, headache, numbness, weakness or ataxia or problems with walking or coordination,  change in mood or  memory.        Current Meds  Medication Sig   acetaminophen (TYLENOL) 500 MG tablet Take 500 mg by mouth every 6 (six) hours as needed (pain).    ARIPiprazole (ABILIFY) 2 MG tablet Take 1 tablet (2 mg total) by mouth daily.   aspirin 81 MG tablet Take 81 mg by mouth daily.   atorvastatin (LIPITOR) 40 MG tablet Take 1 tablet (40 mg total) by mouth daily.   betaxolol (BETOPTIC-S) 0.25 % ophthalmic suspension Place 1 drop into both eyes every morning.   brimonidine (ALPHAGAN) 0.2 % ophthalmic solution Place 1 drop into both eyes daily.   budesonide (PULMICORT) 0.5 MG/2ML nebulizer solution NEBULIZE 1 VIAL TWICE A DAY  buPROPion (WELLBUTRIN SR) 200 MG 12 hr tablet Take 1 tablet (200 mg total) by mouth 2 (two) times daily.   chlorpheniramine (CHLOR-TRIMETON) 4 MG tablet Take 4 mg by mouth every 4 (four) hours as needed (drippy nose, drainage).   Cholecalciferol (VITAMIN D) 50 MCG (2000 UT) tablet Take 2,000 Units by mouth daily.   citalopram (CELEXA) 20 MG tablet Take 1 tablet (20 mg total) by mouth daily.   dextromethorphan (DELSYM) 30 MG/5ML liquid Take 30 mg 2 (two) times daily as needed by mouth for cough.   dextromethorphan-guaiFENesin (MUCINEX DM) 30-600 MG 12hr tablet Take 1 tablet 2 (two) times daily as needed by mouth for  cough.   fenofibrate micronized (LOFIBRA) 134 MG capsule Take 1 capsule (134 mg total) by mouth daily before breakfast. NEED OV.   ipratropium-albuterol (DUONEB) 0.5-2.5 (3) MG/3ML SOLN NEBULIZE 1 VIAL EVERY 6 HOURS AS NEEDED   ketorolac (ACULAR) 0.5 % ophthalmic solution Place 1 drop into both eyes 4 (four) times daily.   levothyroxine (SYNTHROID) 50 MCG tablet Take 1 tablet (50 mcg total) by mouth daily.   memantine (NAMENDA XR) 28 MG CP24 24 hr capsule Take 1 capsule (28 mg total) by mouth daily.   metoprolol tartrate (LOPRESSOR) 25 MG tablet TAKE (1/2) TABLET TWICE DAILY.   montelukast (SINGULAIR) 10 MG tablet Take 1 tablet (10 mg total) by mouth at bedtime.   omeprazole (PRILOSEC) 20 MG capsule TAKE  (1)  CAPSULE  TWICE DAILY (TAKE ON AN EMPTY STOMACH AT LEAST 30 MIN- UTES BEFORE MEALS).   OXYGEN Oxygen 3lpm 24/7, Increase to 4L as needed for shortness of breath with activity   PROAIR HFA 108 (90 Base) MCG/ACT inhaler INHALE 2 PUFFS BY MOUTH EVERY 6 HOURS AS NEEDED   Respiratory Therapy Supplies (FLUTTER) DEVI As needed   RHOPRESSA 0.02 % SOLN Place 1 drop into both eyes at bedtime.   sodium chloride (OCEAN) 0.65 % SOLN nasal spray 2 puffs every 4-6 hours as needed for nasal congestion   traZODone (DESYREL) 50 MG tablet TAKE 1/2 TO 1 TABLET AT BEDTIME AS NEEDED FOR SLEEP   valsartan-hydrochlorothiazide (DIOVAN-HCT) 80-12.5 MG tablet Take 1 tablet by mouth daily.                   Objective:  Physical Exam   08/08/2020       131   02/08/2020      135  08/08/2019      132 02/07/2019       133   07/06/2018    141   10/05/2017    149  03/19/2017     146   Wt 184 01/21/2011  >08/27/2011 186 > 180 09/29/2011 > 10/07/2011  183 > 11/11/2011  181 > 01/13/2012  183 >180 02/12/2012 > 06/09/2012 188 > 189 10/12/2012 >188 01/14/2013 > 01/21/2013 187 > 03/02/2013 >184 03/18/2013 >  03/28/2013 186> 06/06/2013  182 >  07/18/2013  179 >181 09/27/2013 > 12/08/2013   183 > 02/24/2014  178 > 06/09/2014 177 > 09/07/2014 175 >  12/05/2014   173 > 03/06/2015 173>  10/16/2015   171 >  04/28/2016  167 > 11/04/2016  155 >    Vital signs reviewed  08/08/2020  - Note at rest 02 sats  92% on 4 POC   General appearance:    amb with rollator   HEENT : pt wearing mask not removed for exam due to covid -19 concerns.    NECK :  without JVD/Nodes/TM/ nl  carotid upstrokes bilaterally   LUNGS: no acc muscle use,  Mod barrel  contour chest wall with bilateral  Distant bs s audible wheeze and  without cough on insp or exp maneuvers and mod  Hyperresonant  to  percussion bilaterally     CV:  RRR  no s3 or murmur or increase in P2, and no edema   ABD:  soft and nontender with pos mid insp Hoover's  in the supine position. No bruits or organomegaly appreciated, bowel sounds nl  MS:     ext warm without deformities, calf tenderness, cyanosis or clubbing No obvious joint restrictions   SKIN: warm and dry without lesions    NEURO:  alert, lets daughter do all the talking/    no obvious  motor or cerebellar deficits apparent.

## 2020-08-08 NOTE — Patient Instructions (Addendum)
GERD (REFLUX)  is an extremely common cause of respiratory symptoms just like yours , many times with no obvious heartburn at all.    It can be treated with medication, but also with lifestyle changes including elevation of the head of your bed (ideally with 6 -8inch bed blocks under the headboard of your bed),  Smoking cessation, avoidance of late meals, excessive alcohol, and avoid fatty foods, chocolate, peppermint, colas, red wine, and acidic juices such as orange juice.  NO MINT OR MENTHOL PRODUCTS SO NO COUGH DROPS  USE SUGARLESS CANDY INSTEAD (Jolley ranchers or Stover's or Life Savers) or even ice chips will also do - the key is to swallow to prevent all throat clearing. NO OIL BASED VITAMINS - use powdered substitutes.  Avoid fish oil when coughing.     Please schedule a follow up visit in 6  months but call sooner if needed

## 2020-08-08 NOTE — Assessment & Plan Note (Addendum)
Quit smoking 1992    - PFT's 06/11/2007  FEV1   0.70 (34%) ratio 32 with 49% improvement p B2    - HFA 75% effective p coaching 06/20/2011 > 50% 06/24/2011  -Med calendar 09/27/2013 > did not bring as requested 06/09/14 , 06/05/2015 redone  - improved reconciliation 09/07/2014 and 03/07/2015 and 10/16/2015   - 10/05/2017  After extensive coaching inhaler device,  effectiveness =    50%    Group D in terms of symptom/risk and laba/lama/ICS  therefore appropriate rx at this point >>>  Continue duoneb/bud bid and in between duoneb as working well and can't afford lama/laba.  Noct cough more likely gerd than cb or ab > gerd diet/ bed blocks.

## 2020-08-13 DIAGNOSIS — J449 Chronic obstructive pulmonary disease, unspecified: Secondary | ICD-10-CM | POA: Diagnosis not present

## 2020-08-20 ENCOUNTER — Other Ambulatory Visit: Payer: Self-pay | Admitting: Cardiology

## 2020-09-13 DIAGNOSIS — J449 Chronic obstructive pulmonary disease, unspecified: Secondary | ICD-10-CM | POA: Diagnosis not present

## 2020-09-17 ENCOUNTER — Other Ambulatory Visit: Payer: Self-pay | Admitting: Internal Medicine

## 2020-09-19 ENCOUNTER — Encounter (INDEPENDENT_AMBULATORY_CARE_PROVIDER_SITE_OTHER): Payer: Medicare Other | Admitting: Ophthalmology

## 2020-09-19 ENCOUNTER — Other Ambulatory Visit: Payer: Self-pay

## 2020-09-19 DIAGNOSIS — H353231 Exudative age-related macular degeneration, bilateral, with active choroidal neovascularization: Secondary | ICD-10-CM | POA: Diagnosis not present

## 2020-09-19 DIAGNOSIS — I10 Essential (primary) hypertension: Secondary | ICD-10-CM

## 2020-09-19 DIAGNOSIS — H43813 Vitreous degeneration, bilateral: Secondary | ICD-10-CM

## 2020-09-19 DIAGNOSIS — H35033 Hypertensive retinopathy, bilateral: Secondary | ICD-10-CM

## 2020-10-02 ENCOUNTER — Other Ambulatory Visit: Payer: Self-pay | Admitting: Cardiology

## 2020-10-13 DIAGNOSIS — J449 Chronic obstructive pulmonary disease, unspecified: Secondary | ICD-10-CM | POA: Diagnosis not present

## 2020-11-01 ENCOUNTER — Other Ambulatory Visit: Payer: Self-pay

## 2020-11-01 ENCOUNTER — Ambulatory Visit (INDEPENDENT_AMBULATORY_CARE_PROVIDER_SITE_OTHER): Payer: Medicare Other | Admitting: Family Medicine

## 2020-11-01 ENCOUNTER — Encounter: Payer: Self-pay | Admitting: Family Medicine

## 2020-11-01 VITALS — BP 150/68 | HR 73 | Ht 65.0 in | Wt 123.0 lb

## 2020-11-01 DIAGNOSIS — F419 Anxiety disorder, unspecified: Secondary | ICD-10-CM

## 2020-11-01 DIAGNOSIS — F5101 Primary insomnia: Secondary | ICD-10-CM

## 2020-11-01 DIAGNOSIS — Z23 Encounter for immunization: Secondary | ICD-10-CM

## 2020-11-01 DIAGNOSIS — F339 Major depressive disorder, recurrent, unspecified: Secondary | ICD-10-CM | POA: Diagnosis not present

## 2020-11-01 DIAGNOSIS — Z79899 Other long term (current) drug therapy: Secondary | ICD-10-CM | POA: Diagnosis not present

## 2020-11-01 DIAGNOSIS — E039 Hypothyroidism, unspecified: Secondary | ICD-10-CM

## 2020-11-01 DIAGNOSIS — E782 Mixed hyperlipidemia: Secondary | ICD-10-CM | POA: Diagnosis not present

## 2020-11-01 DIAGNOSIS — I10 Essential (primary) hypertension: Secondary | ICD-10-CM

## 2020-11-01 MED ORDER — BUPROPION HCL ER (SR) 200 MG PO TB12: 200.0000 mg | ORAL_TABLET | Freq: Two times a day (BID) | ORAL | 3 refills | Status: DC

## 2020-11-01 MED ORDER — MONTELUKAST SODIUM 10 MG PO TABS: 10.0000 mg | ORAL_TABLET | Freq: Every day | ORAL | 3 refills | Status: DC

## 2020-11-01 MED ORDER — ARIPIPRAZOLE 2 MG PO TABS: 2.0000 mg | ORAL_TABLET | Freq: Every day | ORAL | 3 refills | Status: DC

## 2020-11-01 MED ORDER — VALSARTAN-HYDROCHLOROTHIAZIDE 80-12.5 MG PO TABS: 1.0000 | ORAL_TABLET | Freq: Every day | ORAL | 3 refills | Status: DC

## 2020-11-01 MED ORDER — CITALOPRAM HYDROBROMIDE 20 MG PO TABS: 20.0000 mg | ORAL_TABLET | Freq: Every day | ORAL | 3 refills | Status: DC

## 2020-11-01 MED ORDER — MEMANTINE HCL ER 28 MG PO CP24: 28.0000 mg | ORAL_CAPSULE | Freq: Every day | ORAL | 3 refills | Status: DC

## 2020-11-01 MED ORDER — LEVOTHYROXINE SODIUM 50 MCG PO TABS: 50.0000 ug | ORAL_TABLET | Freq: Every day | ORAL | 3 refills | Status: DC

## 2020-11-01 NOTE — Progress Notes (Signed)
BP (!) 150/68   Pulse 73   Ht 5' 5" (1.651 m)   Wt 123 lb (55.8 kg)   SpO2 96%   BMI 20.47 kg/m    Subjective:   Patient ID: Katie Bryant, female    DOB: Aug 26, 1928, 87 y.o.   MRN: [id number]  HPI: Katie Bryant is a 87 y.o. female presenting on 11/01/2020 for Medical Management of Chronic Issues, Hypothyroidism, Insomnia, and Anxiety   HPI Hyperlipidemia Patient is coming in for recheck of his hyperlipidemia. The patient is currently taking Lipitor. They deny any issues with myalgias or history of liver damage from it. They deny any focal numbness or weakness or chest pain.   Hypothyroidism recheck Patient is coming in for thyroid recheck today as well. They deny any issues with hair changes or heat or cold problems or diarrhea or constipation. They deny any chest pain or palpitations. They are currently on levothyroxine 50 micrograms   Hypertension Patient is currently on metoprolol, and their blood pressure today is 150/68, elevated today but with her dementia we are allowing permissive hypertension. Patient denies any lightheadedness or dizziness. Patient denies headaches, blurred vision, chest pains, shortness of breath, or weakness. Denies any side effects from medication and is content with current medication.   COPD Patient is coming in for COPD recheck today.  He is currently on Singulair and duo nebs and 4 L nasal cannula oxygen and Pulmicort, sees pulmonology.  He has a mild chronic cough but denies any major coughing spells or wheezing spells.  He has 7nighttime symptoms per week and 7daytime symptoms per week currently.  She has a lot of symptoms but she does feel stable.  Patient has Alzheimer's dementia with behavioral disturbance Her memory is worsening but she is currently on trazodone and memantine and citalopram and bupropion to help with her mood and memory and her mood is stable.  Her daughter is here with her and answering most of the questions  for  Relevant past medical, surgical, family and social history reviewed and updated as indicated. Interim medical history since our last visit reviewed. Allergies and medications reviewed and updated.  Review of Systems  Constitutional:  Negative for chills and fever.  HENT:  Positive for congestion. Negative for ear discharge and ear pain.   Eyes:  Negative for visual disturbance.  Respiratory:  Positive for cough, shortness of breath and wheezing. Negative for chest tightness.   Cardiovascular:  Negative for chest pain and leg swelling.  Musculoskeletal:  Negative for back pain and gait problem.  Skin:  Negative for rash.  Neurological:  Negative for dizziness, light-headedness and headaches.  Psychiatric/Behavioral:  Negative for agitation and behavioral problems.   All other systems reviewed and are negative.  Per HPI unless specifically indicated above   Allergies as of 11/01/2020   No Known Allergies      Medication List        Accurate as of November 01, 2020 11:57 AM. If you have any questions, ask your nurse or doctor.          acetaminophen 500 MG tablet Commonly known as: TYLENOL Take 500 mg by mouth every 6 (six) hours as needed (pain).   ARIPiprazole 2 MG tablet Commonly known as: ABILIFY Take 1 tablet (2 mg total) by mouth daily.   aspirin 81 MG tablet Take 81 mg by mouth daily.   atorvastatin 40 MG tablet Commonly known as: LIPITOR Take 1 tablet (40 mg total) by mouth daily.  betaxolol 0.25 % ophthalmic suspension Commonly known as: BETOPTIC-S Place 1 drop into both eyes every morning.   brimonidine 0.2 % ophthalmic solution Commonly known as: ALPHAGAN Place 1 drop into both eyes daily.   budesonide 0.5 MG/2ML nebulizer solution Commonly known as: PULMICORT NEBULIZE 1 VIAL TWICE A DAY   buPROPion 200 MG 12 hr tablet Commonly known as: WELLBUTRIN SR Take 1 tablet (200 mg total) by mouth 2 (two) times daily.   chlorpheniramine 4 MG  tablet Commonly known as: CHLOR-TRIMETON Take 4 mg by mouth every 4 (four) hours as needed (drippy nose, drainage).   citalopram 20 MG tablet Commonly known as: CELEXA Take 1 tablet (20 mg total) by mouth daily.   dextromethorphan 30 MG/5ML liquid Commonly known as: DELSYM Take 30 mg 2 (two) times daily as needed by mouth for cough.   dextromethorphan-guaiFENesin 30-600 MG 12hr tablet Commonly known as: MUCINEX DM Take 1 tablet 2 (two) times daily as needed by mouth for cough.   fenofibrate micronized 134 MG capsule Commonly known as: LOFIBRA Take 1 capsule (134 mg total) by mouth daily before breakfast. NEED OV.   Flutter Devi As needed   ipratropium-albuterol 0.5-2.5 (3) MG/3ML Soln Commonly known as: DUONEB NEBULIZE 1 VIAL EVERY 6 HOURS AS NEEDED   ketorolac 0.5 % ophthalmic solution Commonly known as: ACULAR Place 1 drop into both eyes 4 (four) times daily.   levothyroxine 50 MCG tablet Commonly known as: SYNTHROID Take 1 tablet (50 mcg total) by mouth daily.   meclizine 25 MG tablet Commonly known as: ANTIVERT Take 25 mg by mouth every 8 (eight) hours as needed for dizziness.   memantine 28 MG Cp24 24 hr capsule Commonly known as: NAMENDA XR Take 1 capsule (28 mg total) by mouth daily.   metoprolol tartrate 25 MG tablet Commonly known as: LOPRESSOR TAKE (1/2) TABLET TWICE DAILY.   montelukast 10 MG tablet Commonly known as: SINGULAIR Take 1 tablet (10 mg total) by mouth at bedtime.   omeprazole 20 MG capsule Commonly known as: PRILOSEC TAKE  (1)  CAPSULE  TWICE DAILY (TAKE ON AN EMPTY STOMACH AT LEAST 30 MIN- UTES BEFORE MEALS).   OXYGEN Oxygen 3lpm 24/7, Increase to 4L as needed for shortness of breath with activity   ProAir HFA 108 (90 Base) MCG/ACT inhaler Generic drug: albuterol INHALE 2 PUFFS BY MOUTH EVERY 6 HOURS AS NEEDED   Rhopressa 0.02 % Soln Generic drug: Netarsudil Dimesylate Place 1 drop into both eyes at bedtime.   sodium chloride  0.65 % Soln nasal spray Commonly known as: OCEAN 2 puffs every 4-6 hours as needed for nasal congestion   traZODone 50 MG tablet Commonly known as: DESYREL TAKE 1/2 TO 1 TABLET AT BEDTIME AS NEEDED FOR SLEEP   valsartan-hydrochlorothiazide 80-12.5 MG tablet Commonly known as: DIOVAN-HCT Take 1 tablet by mouth daily.   Vitamin D 50 MCG (2000 UT) tablet Take 2,000 Units by mouth daily.         Objective:   BP (!) 150/68   Pulse 73   Ht 5' 5" (1.651 m)   Wt 123 lb (55.8 kg)   SpO2 96%   BMI 20.47 kg/m   Wt Readings from Last 3 Encounters:  11/01/20 123 lb (55.8 kg)  08/08/20 131 lb 6.4 oz (59.6 kg)  05/02/20 133 lb (60.3 kg)    Physical Exam Vitals and nursing note reviewed.  Constitutional:      General: She is not in acute distress.    Appearance:  She is well-developed. She is not diaphoretic.  Eyes:     Conjunctiva/sclera: Conjunctivae normal.  Cardiovascular:     Rate and Rhythm: Normal rate and regular rhythm.     Heart sounds: Normal heart sounds. No murmur heard. Pulmonary:     Effort: Pulmonary effort is normal. No respiratory distress.     Breath sounds: No stridor. Rhonchi present. No wheezing or rales.  Musculoskeletal:        General: No swelling or tenderness. Normal range of motion.  Skin:    General: Skin is warm and dry.     Findings: No rash.  Neurological:     Mental Status: She is alert and oriented to person, place, and time.     Coordination: Coordination normal.  Psychiatric:        Behavior: Behavior normal.      Assessment & Plan:   Problem List Items Addressed This Visit       Cardiovascular and Mediastinum   HTN (hypertension)   Relevant Medications   valsartan-hydrochlorothiazide (DIOVAN-HCT) 80-12.5 MG tablet   Other Relevant Orders   Lipid panel     Endocrine   Hypothyroidism   Relevant Medications   levothyroxine (SYNTHROID) 50 MCG tablet   Other Relevant Orders   TSH     Other   Insomnia   Relevant  Medications   ARIPiprazole (ABILIFY) 2 MG tablet   citalopram (CELEXA) 20 MG tablet   Hyperlipidemia   Relevant Medications   valsartan-hydrochlorothiazide (DIOVAN-HCT) 80-12.5 MG tablet   Other Relevant Orders   Lipid panel   Depression, recurrent (HCC)   Relevant Medications   ARIPiprazole (ABILIFY) 2 MG tablet   buPROPion (WELLBUTRIN SR) 200 MG 12 hr tablet   citalopram (CELEXA) 20 MG tablet   Anxiety - Primary   Relevant Medications   ARIPiprazole (ABILIFY) 2 MG tablet   buPROPion (WELLBUTRIN SR) 200 MG 12 hr tablet   citalopram (CELEXA) 20 MG tablet   Other Relevant Orders   CBC with Differential/Platelet   CMP14+EGFR   Other Visit Diagnoses     Controlled substance agreement signed       Relevant Medications   ARIPiprazole (ABILIFY) 2 MG tablet   citalopram (CELEXA) 20 MG tablet   Need for immunization against influenza       Relevant Orders   Flu Vaccine QUAD High Dose(Fluad) (Completed)       Seems to be stable except for her memory worsening, keep on current medicines, no changes, continues to follow with pulmonology for lungs and [place] for everything else. Follow up plan: Return in about 6 months (around 05/02/2021), or if symptoms worsen or fail to improve, for htn and thyroid and insomnia.  Counseling provided for all of the vaccine components Orders Placed This Encounter  Procedures   Flu Vaccine QUAD High Dose(Fluad)   CBC with Differential/Platelet   CMP14+EGFR   Lipid panel   TSH    Caryl Pina, MD [facility] 11/01/2020, 11:57 AM

## 2020-11-02 LAB — CBC WITH DIFFERENTIAL/PLATELET
Basophils Absolute: 0.1 10*3/uL (ref 0.0–0.2)
Basos: 1 %
EOS (ABSOLUTE): 0.2 10*3/uL (ref 0.0–0.4)
Eos: 2 %
Hematocrit: 31.5 % — ABNORMAL LOW (ref 34.0–46.6)
Hemoglobin: 10.2 g/dL — ABNORMAL LOW (ref 11.1–15.9)
Immature Grans (Abs): 0 10*3/uL (ref 0.0–0.1)
Immature Granulocytes: 0 %
Lymphocytes Absolute: 1.8 10*3/uL (ref 0.7–3.1)
Lymphs: 23 %
MCH: 28.6 pg (ref 26.6–33.0)
MCHC: 32.4 g/dL (ref 31.5–35.7)
MCV: 88 fL (ref 79–97)
Monocytes Absolute: 0.7 10*3/uL (ref 0.1–0.9)
Monocytes: 9 %
Neutrophils Absolute: 5 10*3/uL (ref 1.4–7.0)
Neutrophils: 65 %
Platelets: 215 10*3/uL (ref 150–450)
RBC: 3.57 x10E6/uL — ABNORMAL LOW (ref 3.77–5.28)
RDW: 11.8 % (ref 11.7–15.4)
WBC: 7.7 10*3/uL (ref 3.4–10.8)

## 2020-11-02 LAB — LIPID PANEL
Chol/HDL Ratio: 2 ratio (ref 0.0–4.4)
Cholesterol, Total: 176 mg/dL (ref 100–199)
HDL: 88 mg/dL (ref >39)
LDL Chol Calc (NIH): 77 mg/dL (ref 0–99)
Triglycerides: 53 mg/dL (ref 0–149)
VLDL Cholesterol Cal: 11 mg/dL (ref 5–40)

## 2020-11-02 LAB — CMP14+EGFR
ALT: 24 IU/L (ref 0–32)
AST: 29 IU/L (ref 0–40)
Albumin/Globulin Ratio: 1.9 (ref 1.2–2.2)
Albumin: 4.1 g/dL (ref 3.5–4.6)
Alkaline Phosphatase: 38 IU/L — ABNORMAL LOW (ref 44–121)
BUN/Creatinine Ratio: 25 (ref 12–28)
BUN: 23 mg/dL (ref 10–36)
Bilirubin Total: 0.3 mg/dL (ref 0.0–1.2)
CO2: 29 mmol/L (ref 20–29)
Calcium: 9.6 mg/dL (ref 8.7–10.3)
Chloride: 95 mmol/L — ABNORMAL LOW (ref 96–106)
Creatinine, Ser: 0.93 mg/dL (ref 0.57–1.00)
Globulin, Total: 2.2 g/dL (ref 1.5–4.5)
Glucose: 73 mg/dL (ref 70–99)
Potassium: 4.4 mmol/L (ref 3.5–5.2)
Sodium: 135 mmol/L (ref 134–144)
Total Protein: 6.3 g/dL (ref 6.0–8.5)
eGFR: 58 mL/min/{1.73_m2} — ABNORMAL LOW (ref >59)

## 2020-11-02 LAB — TSH: TSH: 2.77 u[IU]/mL (ref 0.450–4.500)

## 2020-11-13 DIAGNOSIS — J449 Chronic obstructive pulmonary disease, unspecified: Secondary | ICD-10-CM | POA: Diagnosis not present

## 2020-11-20 DIAGNOSIS — J449 Chronic obstructive pulmonary disease, unspecified: Secondary | ICD-10-CM | POA: Diagnosis not present

## 2020-11-23 ENCOUNTER — Other Ambulatory Visit: Payer: Self-pay | Admitting: Cardiology

## 2020-12-08 ENCOUNTER — Other Ambulatory Visit: Payer: Self-pay | Admitting: Internal Medicine

## 2020-12-13 DIAGNOSIS — J449 Chronic obstructive pulmonary disease, unspecified: Secondary | ICD-10-CM | POA: Diagnosis not present

## 2020-12-19 ENCOUNTER — Encounter (INDEPENDENT_AMBULATORY_CARE_PROVIDER_SITE_OTHER): Payer: Medicare Other | Admitting: Ophthalmology

## 2020-12-27 ENCOUNTER — Ambulatory Visit: Payer: Medicare Other | Admitting: Cardiology

## 2020-12-31 ENCOUNTER — Other Ambulatory Visit: Payer: Self-pay | Admitting: Cardiology

## 2021-01-02 ENCOUNTER — Encounter: Payer: Self-pay | Admitting: Family Medicine

## 2021-01-02 ENCOUNTER — Ambulatory Visit (INDEPENDENT_AMBULATORY_CARE_PROVIDER_SITE_OTHER): Payer: Medicare Other | Admitting: Family Medicine

## 2021-01-02 VITALS — HR 61

## 2021-01-02 DIAGNOSIS — J441 Chronic obstructive pulmonary disease with (acute) exacerbation: Secondary | ICD-10-CM | POA: Diagnosis not present

## 2021-01-02 DIAGNOSIS — Z20828 Contact with and (suspected) exposure to other viral communicable diseases: Secondary | ICD-10-CM

## 2021-01-02 MED ORDER — DOXYCYCLINE HYCLATE 100 MG PO TABS: 100.0000 mg | ORAL_TABLET | Freq: Two times a day (BID) | ORAL | 0 refills | Status: AC

## 2021-01-02 MED ORDER — PREDNISONE 20 MG PO TABS: ORAL_TABLET | ORAL | 0 refills | Status: DC

## 2021-01-02 MED ORDER — NEBULIZER MASK ADULT MISC: 1.0000 | 0 refills | Status: DC

## 2021-01-02 NOTE — Progress Notes (Signed)
Telephone visit  Subjective: DV:VOHY PCP: Dettinger, Fransisca Kaufmann, MD WVP:[id number] Katie Bryant is a 87 y.o. female calls for telephone consult today. Patient provides verbal consent for consult held via phone.  Due to COVID-19 pandemic this visit was conducted virtually. This visit type was conducted due to national recommendations for restrictions regarding the COVID-19 Pandemic (e.g. social distancing, sheltering in place) in an effort to limit this patient's exposure and mitigate transmission in our community. All issues noted in this document were discussed and addressed.  A physical exam was not performed with this format.   Location of patient: home Location of provider: [facility] Others present for call: Assunta Gambles  1. ?COPD exacerbation Has had a productive cough for about 1 week.  She reports chest congestion.  She reports shortness of breath easier.  She is chronically treated with O2 via nasal cannula.  She is on 4 L of O2.  She was exposed to Flu A on Christmas eve but was sick before then.  She has dementia and most of the history by her caregiver Estill Bamberg (daughter typically cares for her but she is out of town currently, will return tomorrow).  No fevers, no hemoptysis.   ROS: Per HPI  No Known Allergies Past Medical History:  Diagnosis Date   Alzheimer's disease ([place]) 03/29/2015   Anxiety    Aortic stenosis, mild    Arthritis    Asthma    Back pain    Breast cancer (HCC)    Carotid artery disease ([place]) 01/01/2017   COPD (chronic obstructive pulmonary disease) (HCC)    Dementia    Depression    GERD (gastroesophageal reflux disease)    H/O hiatal hernia    Hypertension    Hypothyroidism 06/25/2016   Lymphadenopathy 10/07/2012   Obesity    Oxygen dependent    Pneumonia    Right hamstring muscle strain 04/26/2013   Sleep apnea     Current Outpatient Medications:    acetaminophen (TYLENOL) 500 MG tablet, Take 500 mg by mouth every 6 (six) hours as needed (pain). , Disp: ,  Rfl:    ARIPiprazole (ABILIFY) 2 MG tablet, Take 1 tablet (2 mg total) by mouth daily., Disp: 90 tablet, Rfl: 3   aspirin 81 MG tablet, Take 81 mg by mouth daily., Disp: , Rfl:    atorvastatin (LIPITOR) 40 MG tablet, Take 1 tablet (40 mg total) by mouth daily., Disp: 90 tablet, Rfl: 3   betaxolol (BETOPTIC-S) 0.25 % ophthalmic suspension, Place 1 drop into both eyes every morning., Disp: , Rfl:    brimonidine (ALPHAGAN) 0.2 % ophthalmic solution, Place 1 drop into both eyes daily., Disp: , Rfl:    budesonide (PULMICORT) 0.5 MG/2ML nebulizer solution, NEBULIZE 1 VIAL TWICE A DAY, Disp: 120 mL, Rfl: 12   buPROPion (WELLBUTRIN SR) 200 MG 12 hr tablet, Take 1 tablet (200 mg total) by mouth 2 (two) times daily., Disp: 180 tablet, Rfl: 3   chlorpheniramine (CHLOR-TRIMETON) 4 MG tablet, Take 4 mg by mouth every 4 (four) hours as needed (drippy nose, drainage)., Disp: , Rfl:    Cholecalciferol (VITAMIN D) 50 MCG (2000 UT) tablet, Take 2,000 Units by mouth daily., Disp: , Rfl:    citalopram (CELEXA) 20 MG tablet, Take 1 tablet (20 mg total) by mouth daily., Disp: 90 tablet, Rfl: 3   dextromethorphan (DELSYM) 30 MG/5ML liquid, Take 30 mg 2 (two) times daily as needed by mouth for cough., Disp: , Rfl:    dextromethorphan-guaiFENesin (MUCINEX DM) 30-600 MG  12hr tablet, Take 1 tablet 2 (two) times daily as needed by mouth for cough., Disp: , Rfl:    fenofibrate micronized (LOFIBRA) 134 MG capsule, TAKE (1) CAPSULE DAILY BEFORE BREAKFAST., Disp: 30 capsule, Rfl: 0   ipratropium-albuterol (DUONEB) 0.5-2.5 (3) MG/3ML SOLN, NEBULIZE 1 VIAL EVERY 6 HOURS AS NEEDED, Disp: 360 mL, Rfl: 6   ketorolac (ACULAR) 0.5 % ophthalmic solution, Place 1 drop into both eyes 4 (four) times daily., Disp: , Rfl:    levothyroxine (SYNTHROID) 50 MCG tablet, Take 1 tablet (50 mcg total) by mouth daily., Disp: 90 tablet, Rfl: 3   meclizine (ANTIVERT) 25 MG tablet, Take 25 mg by mouth every 8 (eight) hours as needed for dizziness., Disp:  , Rfl:    memantine (NAMENDA XR) 28 MG CP24 24 hr capsule, Take 1 capsule (28 mg total) by mouth daily., Disp: 90 capsule, Rfl: 3   metoprolol tartrate (LOPRESSOR) 25 MG tablet, TAKE (1/2) TABLET TWICE DAILY., Disp: 90 tablet, Rfl: 1   montelukast (SINGULAIR) 10 MG tablet, Take 1 tablet (10 mg total) by mouth at bedtime., Disp: 90 tablet, Rfl: 3   omeprazole (PRILOSEC) 20 MG capsule, TAKE  (1)  CAPSULE  TWICE DAILY (TAKE ON AN EMPTY STOMACH AT LEAST 30 MIN- UTES BEFORE MEALS)., Disp: 60 capsule, Rfl: 6   OXYGEN, Oxygen 3lpm 24/7, Increase to 4L as needed for shortness of breath with activity, Disp: , Rfl:    PROAIR HFA 108 (90 Base) MCG/ACT inhaler, INHALE 2 PUFFS BY MOUTH EVERY 6 HOURS AS NEEDED, Disp: 8.5 g, Rfl: 5   Respiratory Therapy Supplies (FLUTTER) DEVI, As needed, Disp: , Rfl:    RHOPRESSA 0.02 % SOLN, Place 1 drop into both eyes at bedtime., Disp: , Rfl:    sodium chloride (OCEAN) 0.65 % SOLN nasal spray, 2 puffs every 4-6 hours as needed for nasal congestion, Disp: , Rfl:    traZODone (DESYREL) 50 MG tablet, TAKE 1/2 TO 1 TABLET AT BEDTIME AS NEEDED FOR SLEEP, Disp: 90 tablet, Rfl: 3   valsartan-hydrochlorothiazide (DIOVAN-HCT) 80-12.5 MG tablet, Take 1 tablet by mouth daily., Disp: 90 tablet, Rfl: 3  Assessment/ Plan: 87 y.o. female   COPD with acute exacerbation ([place]) - Plan: Respiratory Therapy Supplies (NEBULIZER MASK ADULT) MISC, doxycycline (VIBRA-TABS) 100 MG tablet, predniSONE (DELTASONE) 20 MG tablet  Exposure to influenza - Plan: doxycycline (VIBRA-TABS) 100 MG tablet, predniSONE (DELTASONE) 20 MG tablet  Concern for COPD with exacerbation.  Prednisone sent.  Doxycycline sent.  Continue inhalations as prescribed.  I sent her over a mask since she does not have functional 1 currently.  We will CC her pulmonologist as FYI.  I counseled her caregiver on red flag signs and symptoms warranting further evaluation.  She voiced good understanding and will f/u as needed  Start  time: 2:26pm End time: 2:35pm  Total time spent on patient care (including telephone call/ virtual visit): 9 minutes  [place], [place] [phone]

## 2021-01-03 ENCOUNTER — Telehealth: Payer: Self-pay | Admitting: Family Medicine

## 2021-01-03 ENCOUNTER — Telehealth: Payer: Medicare Other | Admitting: Nurse Practitioner

## 2021-01-03 NOTE — Telephone Encounter (Signed)
Spoke with daughter who has been out of town and just got home today.  Patient is experiencing weakness, shortness of breath, and is leaning to one side unable to sit up.  Caregiver gave her a sponge bath earlier today and she was unable to walk back from bath to other room.  I recommended to daughter that she take her mom on to the ER for further evaluation.  Daughter agrees and will do so.

## 2021-01-04 ENCOUNTER — Inpatient Hospital Stay (HOSPITAL_COMMUNITY)
Admission: EM | Admit: 2021-01-04 | Discharge: 2021-01-10 | DRG: 177 | Disposition: A | Discharge status: Hospice | Payer: Medicare Other | Attending: Internal Medicine | Admitting: Internal Medicine

## 2021-01-04 ENCOUNTER — Emergency Department (HOSPITAL_COMMUNITY): Payer: Medicare Other

## 2021-01-04 ENCOUNTER — Other Ambulatory Visit: Payer: Self-pay

## 2021-01-04 ENCOUNTER — Encounter (HOSPITAL_COMMUNITY): Payer: Self-pay | Admitting: Emergency Medicine

## 2021-01-04 DIAGNOSIS — Z20822 Contact with and (suspected) exposure to covid-19: Secondary | ICD-10-CM | POA: Diagnosis present

## 2021-01-04 DIAGNOSIS — E860 Dehydration: Secondary | ICD-10-CM | POA: Diagnosis not present

## 2021-01-04 DIAGNOSIS — J441 Chronic obstructive pulmonary disease with (acute) exacerbation: Secondary | ICD-10-CM | POA: Diagnosis present

## 2021-01-04 DIAGNOSIS — K219 Gastro-esophageal reflux disease without esophagitis: Secondary | ICD-10-CM | POA: Diagnosis present

## 2021-01-04 DIAGNOSIS — J9622 Acute and chronic respiratory failure with hypercapnia: Secondary | ICD-10-CM | POA: Diagnosis present

## 2021-01-04 DIAGNOSIS — J9621 Acute and chronic respiratory failure with hypoxia: Secondary | ICD-10-CM | POA: Diagnosis present

## 2021-01-04 DIAGNOSIS — R54 Age-related physical debility: Secondary | ICD-10-CM | POA: Diagnosis present

## 2021-01-04 DIAGNOSIS — M1990 Unspecified osteoarthritis, unspecified site: Secondary | ICD-10-CM | POA: Diagnosis not present

## 2021-01-04 DIAGNOSIS — Z87891 Personal history of nicotine dependence: Secondary | ICD-10-CM

## 2021-01-04 DIAGNOSIS — F02818 Dementia in other diseases classified elsewhere, unspecified severity, with other behavioral disturbance: Secondary | ICD-10-CM | POA: Diagnosis not present

## 2021-01-04 DIAGNOSIS — Z801 Family history of malignant neoplasm of trachea, bronchus and lung: Secondary | ICD-10-CM

## 2021-01-04 DIAGNOSIS — E871 Hypo-osmolality and hyponatremia: Secondary | ICD-10-CM | POA: Diagnosis present

## 2021-01-04 DIAGNOSIS — I4510 Unspecified right bundle-branch block: Secondary | ICD-10-CM | POA: Diagnosis not present

## 2021-01-04 DIAGNOSIS — Z79899 Other long term (current) drug therapy: Secondary | ICD-10-CM

## 2021-01-04 DIAGNOSIS — G309 Alzheimer's disease, unspecified: Secondary | ICD-10-CM | POA: Diagnosis not present

## 2021-01-04 DIAGNOSIS — R1310 Dysphagia, unspecified: Secondary | ICD-10-CM | POA: Diagnosis present

## 2021-01-04 DIAGNOSIS — R262 Difficulty in walking, not elsewhere classified: Secondary | ICD-10-CM | POA: Diagnosis not present

## 2021-01-04 DIAGNOSIS — F32A Depression, unspecified: Secondary | ICD-10-CM | POA: Diagnosis not present

## 2021-01-04 DIAGNOSIS — Z7189 Other specified counseling: Secondary | ICD-10-CM | POA: Diagnosis not present

## 2021-01-04 DIAGNOSIS — E039 Hypothyroidism, unspecified: Secondary | ICD-10-CM | POA: Diagnosis present

## 2021-01-04 DIAGNOSIS — Z515 Encounter for palliative care: Secondary | ICD-10-CM | POA: Diagnosis not present

## 2021-01-04 DIAGNOSIS — I2510 Atherosclerotic heart disease of native coronary artery without angina pectoris: Secondary | ICD-10-CM | POA: Diagnosis present

## 2021-01-04 DIAGNOSIS — I10 Essential (primary) hypertension: Secondary | ICD-10-CM | POA: Diagnosis not present

## 2021-01-04 DIAGNOSIS — J449 Chronic obstructive pulmonary disease, unspecified: Secondary | ICD-10-CM | POA: Diagnosis not present

## 2021-01-04 DIAGNOSIS — Z66 Do not resuscitate: Secondary | ICD-10-CM | POA: Diagnosis present

## 2021-01-04 DIAGNOSIS — Z7982 Long term (current) use of aspirin: Secondary | ICD-10-CM

## 2021-01-04 DIAGNOSIS — R0689 Other abnormalities of breathing: Secondary | ICD-10-CM | POA: Diagnosis not present

## 2021-01-04 DIAGNOSIS — Z820 Family history of epilepsy and other diseases of the nervous system: Secondary | ICD-10-CM

## 2021-01-04 DIAGNOSIS — F419 Anxiety disorder, unspecified: Secondary | ICD-10-CM | POA: Diagnosis present

## 2021-01-04 DIAGNOSIS — R6889 Other general symptoms and signs: Secondary | ICD-10-CM | POA: Diagnosis not present

## 2021-01-04 DIAGNOSIS — R0602 Shortness of breath: Secondary | ICD-10-CM

## 2021-01-04 DIAGNOSIS — Z853 Personal history of malignant neoplasm of breast: Secondary | ICD-10-CM

## 2021-01-04 DIAGNOSIS — I6523 Occlusion and stenosis of bilateral carotid arteries: Secondary | ICD-10-CM | POA: Diagnosis not present

## 2021-01-04 DIAGNOSIS — Z831 Family history of other infectious and parasitic diseases: Secondary | ICD-10-CM

## 2021-01-04 DIAGNOSIS — G9341 Metabolic encephalopathy: Secondary | ICD-10-CM | POA: Diagnosis present

## 2021-01-04 DIAGNOSIS — R4189 Other symptoms and signs involving cognitive functions and awareness: Secondary | ICD-10-CM | POA: Diagnosis present

## 2021-01-04 DIAGNOSIS — Z79890 Hormone replacement therapy (postmenopausal): Secondary | ICD-10-CM

## 2021-01-04 DIAGNOSIS — J690 Pneumonitis due to inhalation of food and vomit: Secondary | ICD-10-CM | POA: Diagnosis not present

## 2021-01-04 DIAGNOSIS — I700 Atherosclerosis of aorta: Secondary | ICD-10-CM | POA: Diagnosis not present

## 2021-01-04 DIAGNOSIS — J189 Pneumonia, unspecified organism: Secondary | ICD-10-CM

## 2021-01-04 DIAGNOSIS — G459 Transient cerebral ischemic attack, unspecified: Secondary | ICD-10-CM | POA: Diagnosis not present

## 2021-01-04 DIAGNOSIS — F0280 Dementia in other diseases classified elsewhere without behavioral disturbance: Secondary | ICD-10-CM | POA: Diagnosis not present

## 2021-01-04 DIAGNOSIS — J9612 Chronic respiratory failure with hypercapnia: Secondary | ICD-10-CM

## 2021-01-04 DIAGNOSIS — Z9981 Dependence on supplemental oxygen: Secondary | ICD-10-CM

## 2021-01-04 DIAGNOSIS — I779 Disorder of arteries and arterioles, unspecified: Secondary | ICD-10-CM | POA: Diagnosis present

## 2021-01-04 DIAGNOSIS — J439 Emphysema, unspecified: Secondary | ICD-10-CM | POA: Diagnosis not present

## 2021-01-04 DIAGNOSIS — Z7951 Long term (current) use of inhaled steroids: Secondary | ICD-10-CM

## 2021-01-04 DIAGNOSIS — A419 Sepsis, unspecified organism: Secondary | ICD-10-CM

## 2021-01-04 DIAGNOSIS — Z9010 Acquired absence of unspecified breast and nipple: Secondary | ICD-10-CM

## 2021-01-04 DIAGNOSIS — R062 Wheezing: Secondary | ICD-10-CM | POA: Diagnosis not present

## 2021-01-04 DIAGNOSIS — J9601 Acute respiratory failure with hypoxia: Secondary | ICD-10-CM | POA: Diagnosis not present

## 2021-01-04 DIAGNOSIS — R404 Transient alteration of awareness: Secondary | ICD-10-CM | POA: Diagnosis not present

## 2021-01-04 DIAGNOSIS — Z743 Need for continuous supervision: Secondary | ICD-10-CM | POA: Diagnosis not present

## 2021-01-04 LAB — CBC WITH DIFFERENTIAL/PLATELET
Abs Immature Granulocytes: 0.03 10*3/uL (ref 0.00–0.07)
Basophils Absolute: 0 10*3/uL (ref 0.0–0.1)
Basophils Relative: 0 %
Eosinophils Absolute: 0 10*3/uL (ref 0.0–0.5)
Eosinophils Relative: 0 %
HCT: 30.5 % — ABNORMAL LOW (ref 36.0–46.0)
Hemoglobin: 10.1 g/dL — ABNORMAL LOW (ref 12.0–15.0)
Immature Granulocytes: 0 %
Lymphocytes Relative: 3 %
Lymphs Abs: 0.4 10*3/uL — ABNORMAL LOW (ref 0.7–4.0)
MCH: 29.4 pg (ref 26.0–34.0)
MCHC: 33.1 g/dL (ref 30.0–36.0)
MCV: 88.7 fL (ref 80.0–100.0)
Monocytes Absolute: 0.6 10*3/uL (ref 0.1–1.0)
Monocytes Relative: 5 %
Neutro Abs: 11.9 10*3/uL — ABNORMAL HIGH (ref 1.7–7.7)
Neutrophils Relative %: 92 %
Platelets: 222 10*3/uL (ref 150–400)
RBC: 3.44 MIL/uL — ABNORMAL LOW (ref 3.87–5.11)
RDW: 12.2 % (ref 11.5–15.5)
WBC: 13 10*3/uL — ABNORMAL HIGH (ref 4.0–10.5)
nRBC: 0 % (ref 0.0–0.2)

## 2021-01-04 LAB — COMPREHENSIVE METABOLIC PANEL
ALT: 43 U/L (ref 0–44)
AST: 92 U/L — ABNORMAL HIGH (ref 15–41)
Albumin: 3.7 g/dL (ref 3.5–5.0)
Alkaline Phosphatase: 46 U/L (ref 38–126)
Anion gap: 9 (ref 5–15)
BUN: 21 mg/dL (ref 8–23)
CO2: 27 mmol/L (ref 22–32)
Calcium: 8.9 mg/dL (ref 8.9–10.3)
Chloride: 87 mmol/L — ABNORMAL LOW (ref 98–111)
Creatinine, Ser: 0.74 mg/dL (ref 0.44–1.00)
GFR, Estimated: >60 mL/min (ref >60)
Glucose, Bld: 115 mg/dL — ABNORMAL HIGH (ref 70–99)
Potassium: 3.5 mmol/L (ref 3.5–5.1)
Sodium: 123 mmol/L — ABNORMAL LOW (ref 135–145)
Total Bilirubin: 1 mg/dL (ref 0.3–1.2)
Total Protein: 6.8 g/dL (ref 6.5–8.1)

## 2021-01-04 LAB — TSH: TSH: 1.188 u[IU]/mL (ref 0.350–4.500)

## 2021-01-04 LAB — URINALYSIS, ROUTINE W REFLEX MICROSCOPIC
Bacteria, UA: NONE SEEN
Bilirubin Urine: NEGATIVE
Glucose, UA: NEGATIVE mg/dL
Ketones, ur: NEGATIVE mg/dL
Leukocytes,Ua: NEGATIVE
Nitrite: NEGATIVE
Protein, ur: 30 mg/dL — AB
Specific Gravity, Urine: 1.015 (ref 1.005–1.030)
pH: 6 (ref 5.0–8.0)

## 2021-01-04 LAB — OSMOLALITY, URINE: Osmolality, Ur: 538 mOsm/kg (ref 300–900)

## 2021-01-04 LAB — SODIUM, URINE, RANDOM: Sodium, Ur: 77 mmol/L

## 2021-01-04 LAB — BASIC METABOLIC PANEL
Anion gap: 11 (ref 5–15)
BUN: 15 mg/dL (ref 8–23)
CO2: 25 mmol/L (ref 22–32)
Calcium: 8.4 mg/dL — ABNORMAL LOW (ref 8.9–10.3)
Chloride: 91 mmol/L — ABNORMAL LOW (ref 98–111)
Creatinine, Ser: 0.69 mg/dL (ref 0.44–1.00)
GFR, Estimated: >60 mL/min (ref >60)
Glucose, Bld: 145 mg/dL — ABNORMAL HIGH (ref 70–99)
Potassium: 3.4 mmol/L — ABNORMAL LOW (ref 3.5–5.1)
Sodium: 127 mmol/L — ABNORMAL LOW (ref 135–145)

## 2021-01-04 LAB — BLOOD GAS, VENOUS
Acid-Base Excess: 4.8 mmol/L — ABNORMAL HIGH (ref 0.0–2.0)
Bicarbonate: 28 mmol/L (ref 20.0–28.0)
FIO2: 40
O2 Saturation: 81 %
Patient temperature: 36.4
pCO2, Ven: 46.6 mmHg (ref 44.0–60.0)
pH, Ven: 7.412 (ref 7.250–7.430)
pO2, Ven: 46.4 mmHg — ABNORMAL HIGH (ref 32.0–45.0)

## 2021-01-04 LAB — TROPONIN I (HIGH SENSITIVITY)
Troponin I (High Sensitivity): 39 ng/L — ABNORMAL HIGH (ref <18)
Troponin I (High Sensitivity): 44 ng/L — ABNORMAL HIGH (ref <18)

## 2021-01-04 LAB — RESP PANEL BY RT-PCR (FLU A&B, COVID) ARPGX2
Influenza A by PCR: NEGATIVE
Influenza B by PCR: NEGATIVE
SARS Coronavirus 2 by RT PCR: NEGATIVE

## 2021-01-04 LAB — APTT: aPTT: 26 seconds (ref 24–36)

## 2021-01-04 LAB — PROTIME-INR
INR: 1 (ref 0.8–1.2)
Prothrombin Time: 12.7 seconds (ref 11.4–15.2)

## 2021-01-04 LAB — MRSA NEXT GEN BY PCR, NASAL: MRSA by PCR Next Gen: NOT DETECTED

## 2021-01-04 LAB — LACTIC ACID, PLASMA
Lactic Acid, Venous: 1.3 mmol/L (ref 0.5–1.9)
Lactic Acid, Venous: 1.5 mmol/L (ref 0.5–1.9)

## 2021-01-04 LAB — BRAIN NATRIURETIC PEPTIDE: B Natriuretic Peptide: 205 pg/mL — ABNORMAL HIGH (ref 0.0–100.0)

## 2021-01-04 LAB — PROCALCITONIN: Procalcitonin: <0.1 ng/mL

## 2021-01-04 MED ORDER — ONDANSETRON HCL 4 MG PO TABS: 4.0000 mg | ORAL_TABLET | Freq: Four times a day (QID) | ORAL | Status: DC | PRN

## 2021-01-04 MED ORDER — BETAXOLOL HCL 0.25 % OP SUSP
1.0000 [drp] | Freq: Every morning | OPHTHALMIC | Status: DC
  Administered 2021-01-07 – 2021-01-10 (×4): 1 [drp] via OPHTHALMIC
  Filled 2021-01-04: qty 10

## 2021-01-04 MED ORDER — VALSARTAN-HYDROCHLOROTHIAZIDE 80-12.5 MG PO TABS: 1.0000 | ORAL_TABLET | Freq: Every day | ORAL | Status: DC

## 2021-01-04 MED ORDER — ATORVASTATIN CALCIUM 40 MG PO TABS
40.0000 mg | ORAL_TABLET | Freq: Every day | ORAL | Status: DC
  Administered 2021-01-07 – 2021-01-09 (×3): 40 mg via ORAL
  Filled 2021-01-04 (×5): qty 1

## 2021-01-04 MED ORDER — MEMANTINE HCL ER 28 MG PO CP24
28.0000 mg | ORAL_CAPSULE | Freq: Every day | ORAL | Status: DC
  Administered 2021-01-06 – 2021-01-09 (×4): 28 mg via ORAL
  Filled 2021-01-04 (×6): qty 1

## 2021-01-04 MED ORDER — DM-GUAIFENESIN ER 30-600 MG PO TB12: 1.0000 | ORAL_TABLET | Freq: Two times a day (BID) | ORAL | Status: DC | PRN

## 2021-01-04 MED ORDER — BUPROPION HCL ER (SR) 100 MG PO TB12
200.0000 mg | ORAL_TABLET | Freq: Two times a day (BID) | ORAL | Status: DC
  Filled 2021-01-04 (×7): qty 2

## 2021-01-04 MED ORDER — PANTOPRAZOLE SODIUM 40 MG PO TBEC
40.0000 mg | DELAYED_RELEASE_TABLET | Freq: Every day | ORAL | Status: DC
  Administered 2021-01-06 – 2021-01-10 (×5): 40 mg via ORAL
  Filled 2021-01-04 (×5): qty 1

## 2021-01-04 MED ORDER — ONDANSETRON HCL 4 MG/2ML IJ SOLN: 4.0000 mg | Freq: Four times a day (QID) | INTRAMUSCULAR | Status: DC | PRN

## 2021-01-04 MED ORDER — FLUTICASONE FUROATE-VILANTEROL 100-25 MCG/ACT IN AEPB
1.0000 | INHALATION_SPRAY | Freq: Every day | RESPIRATORY_TRACT | Status: DC
  Administered 2021-01-06 – 2021-01-10 (×5): 1 via RESPIRATORY_TRACT
  Filled 2021-01-04: qty 28

## 2021-01-04 MED ORDER — HYDROCHLOROTHIAZIDE 12.5 MG PO TABS
12.5000 mg | ORAL_TABLET | Freq: Every day | ORAL | Status: DC
  Filled 2021-01-04: qty 1

## 2021-01-04 MED ORDER — SODIUM CHLORIDE 0.9 % IV BOLUS (SEPSIS)
250.0000 mL | Freq: Once | INTRAVENOUS | Status: AC
  Administered 2021-01-04: 10:00:00 250 mL via INTRAVENOUS

## 2021-01-04 MED ORDER — TRAZODONE HCL 50 MG PO TABS
50.0000 mg | ORAL_TABLET | Freq: Every day | ORAL | Status: DC
  Filled 2021-01-04: qty 1

## 2021-01-04 MED ORDER — IRBESARTAN 150 MG PO TABS
75.0000 mg | ORAL_TABLET | Freq: Every day | ORAL | Status: DC
  Administered 2021-01-06 – 2021-01-10 (×5): 75 mg via ORAL
  Filled 2021-01-04 (×6): qty 1

## 2021-01-04 MED ORDER — IPRATROPIUM BROMIDE 0.02 % IN SOLN: 0.5000 mg | Freq: Four times a day (QID) | RESPIRATORY_TRACT | Status: DC

## 2021-01-04 MED ORDER — SODIUM CHLORIDE 0.9 % IV BOLUS (SEPSIS): 500.0000 mL | Freq: Once | INTRAVENOUS | Status: DC

## 2021-01-04 MED ORDER — METHYLPREDNISOLONE SODIUM SUCC 125 MG IJ SOLR
120.0000 mg | INTRAMUSCULAR | Status: DC
Start: 2021-01-05 — End: 2021-01-05
  Administered 2021-01-05: 120 mg via INTRAVENOUS
  Filled 2021-01-04: qty 2

## 2021-01-04 MED ORDER — ASPIRIN EC 81 MG PO TBEC
81.0000 mg | DELAYED_RELEASE_TABLET | Freq: Every day | ORAL | Status: DC
  Administered 2021-01-07 – 2021-01-10 (×4): 81 mg via ORAL
  Filled 2021-01-04 (×5): qty 1

## 2021-01-04 MED ORDER — METOPROLOL TARTRATE 25 MG PO TABS
25.0000 mg | ORAL_TABLET | Freq: Two times a day (BID) | ORAL | Status: DC
  Filled 2021-01-04: qty 1

## 2021-01-04 MED ORDER — METHYLPREDNISOLONE SODIUM SUCC 125 MG IJ SOLR
125.0000 mg | Freq: Once | INTRAMUSCULAR | Status: AC
  Administered 2021-01-04: 20:00:00 125 mg via INTRAVENOUS
  Filled 2021-01-04: qty 2

## 2021-01-04 MED ORDER — SODIUM CHLORIDE 0.9 % IV SOLN
500.0000 mg | INTRAVENOUS | Status: AC
  Administered 2021-01-04 – 2021-01-08 (×5): 500 mg via INTRAVENOUS
  Filled 2021-01-04 (×6): qty 5

## 2021-01-04 MED ORDER — ORAL CARE MOUTH RINSE
15.0000 mL | Freq: Two times a day (BID) | OROMUCOSAL | Status: DC
  Administered 2021-01-05 – 2021-01-10 (×11): 15 mL via OROMUCOSAL

## 2021-01-04 MED ORDER — LORAZEPAM 2 MG/ML IJ SOLN
0.5000 mg | INTRAMUSCULAR | Status: DC | PRN
  Administered 2021-01-04 – 2021-01-10 (×17): 0.5 mg via INTRAVENOUS
  Filled 2021-01-04 (×17): qty 1

## 2021-01-04 MED ORDER — VITAMIN D 25 MCG (1000 UNIT) PO TABS
2000.0000 [IU] | ORAL_TABLET | Freq: Every day | ORAL | Status: DC
  Administered 2021-01-06 – 2021-01-10 (×5): 2000 [IU] via ORAL
  Filled 2021-01-04 (×6): qty 2

## 2021-01-04 MED ORDER — LORAZEPAM 2 MG/ML IJ SOLN
0.5000 mg | Freq: Once | INTRAMUSCULAR | Status: AC
  Administered 2021-01-04: 16:00:00 0.5 mg via INTRAVENOUS
  Filled 2021-01-04: qty 1

## 2021-01-04 MED ORDER — ENOXAPARIN SODIUM 40 MG/0.4ML IJ SOSY
40.0000 mg | PREFILLED_SYRINGE | INTRAMUSCULAR | Status: DC
  Administered 2021-01-04 – 2021-01-09 (×6): 40 mg via SUBCUTANEOUS
  Filled 2021-01-04 (×6): qty 0.4

## 2021-01-04 MED ORDER — ARIPIPRAZOLE 2 MG PO TABS
2.0000 mg | ORAL_TABLET | Freq: Every day | ORAL | Status: DC
  Administered 2021-01-06 – 2021-01-10 (×5): 2 mg via ORAL
  Filled 2021-01-04 (×9): qty 1

## 2021-01-04 MED ORDER — MECLIZINE HCL 12.5 MG PO TABS: 25.0000 mg | ORAL_TABLET | Freq: Three times a day (TID) | ORAL | Status: DC | PRN

## 2021-01-04 MED ORDER — CITALOPRAM HYDROBROMIDE 20 MG PO TABS: 20.0000 mg | ORAL_TABLET | Freq: Every day | ORAL | Status: DC

## 2021-01-04 MED ORDER — ALBUTEROL (5 MG/ML) CONTINUOUS INHALATION SOLN
2.5000 mg/h | INHALATION_SOLUTION | Freq: Once | RESPIRATORY_TRACT | Status: AC
  Administered 2021-01-04: 15:00:00 2.5 mg/h via RESPIRATORY_TRACT

## 2021-01-04 MED ORDER — NEBULIZER MASK ADULT MISC: 1.0000 | Status: DC

## 2021-01-04 MED ORDER — FENOFIBRATE 160 MG PO TABS
160.0000 mg | ORAL_TABLET | Freq: Every day | ORAL | Status: DC
  Administered 2021-01-06 – 2021-01-09 (×4): 160 mg via ORAL
  Filled 2021-01-04 (×5): qty 1

## 2021-01-04 MED ORDER — NETARSUDIL DIMESYLATE 0.02 % OP SOLN
1.0000 [drp] | Freq: Every day | OPHTHALMIC | Status: DC
Start: 2021-01-04 — End: 2021-01-10
  Administered 2021-01-06: 1 [drp] via OPHTHALMIC

## 2021-01-04 MED ORDER — MONTELUKAST SODIUM 10 MG PO TABS
10.0000 mg | ORAL_TABLET | Freq: Every day | ORAL | Status: DC
  Administered 2021-01-07 – 2021-01-09 (×3): 10 mg via ORAL
  Filled 2021-01-04 (×5): qty 1

## 2021-01-04 MED ORDER — SODIUM CHLORIDE 0.9 % IV SOLN
2.0000 g | INTRAVENOUS | Status: AC
  Administered 2021-01-04 – 2021-01-08 (×5): 2 g via INTRAVENOUS
  Filled 2021-01-04 (×5): qty 20

## 2021-01-04 MED ORDER — SODIUM CHLORIDE 0.9 % IV BOLUS (SEPSIS)
1000.0000 mL | Freq: Once | INTRAVENOUS | Status: AC
  Administered 2021-01-04: 10:00:00 1000 mL via INTRAVENOUS

## 2021-01-04 MED ORDER — LEVALBUTEROL HCL 0.63 MG/3ML IN NEBU: 0.6300 mg | INHALATION_SOLUTION | Freq: Four times a day (QID) | RESPIRATORY_TRACT | Status: DC | PRN

## 2021-01-04 MED ORDER — LEVOTHYROXINE SODIUM 50 MCG PO TABS
50.0000 ug | ORAL_TABLET | Freq: Every day | ORAL | Status: DC
  Administered 2021-01-07 – 2021-01-10 (×4): 50 ug via ORAL
  Filled 2021-01-04: qty 2
  Filled 2021-01-04 (×3): qty 1

## 2021-01-04 MED ORDER — CHLORHEXIDINE GLUCONATE CLOTH 2 % EX PADS
6.0000 | MEDICATED_PAD | Freq: Every day | CUTANEOUS | Status: DC
  Administered 2021-01-04 – 2021-01-10 (×7): 6 via TOPICAL

## 2021-01-04 MED ORDER — BRIMONIDINE TARTRATE 0.2 % OP SOLN
1.0000 [drp] | Freq: Every day | OPHTHALMIC | Status: DC
  Administered 2021-01-07 – 2021-01-10 (×4): 1 [drp] via OPHTHALMIC
  Filled 2021-01-04 (×4): qty 5

## 2021-01-04 MED ORDER — LORAZEPAM 2 MG/ML IJ SOLN
0.2500 mg | Freq: Once | INTRAMUSCULAR | Status: AC
  Administered 2021-01-04: 14:00:00 0.25 mg via INTRAVENOUS
  Filled 2021-01-04: qty 1

## 2021-01-04 MED ORDER — IPRATROPIUM-ALBUTEROL 0.5-2.5 (3) MG/3ML IN SOLN
3.0000 mL | Freq: Once | RESPIRATORY_TRACT | Status: AC
  Administered 2021-01-04: 12:00:00 3 mL via RESPIRATORY_TRACT
  Filled 2021-01-04: qty 3

## 2021-01-04 MED ORDER — METOPROLOL TARTRATE 5 MG/5ML IV SOLN
5.0000 mg | Freq: Four times a day (QID) | INTRAVENOUS | Status: DC
  Administered 2021-01-04 – 2021-01-07 (×10): 5 mg via INTRAVENOUS
  Filled 2021-01-04 (×11): qty 5

## 2021-01-04 MED ORDER — IPRATROPIUM-ALBUTEROL 0.5-2.5 (3) MG/3ML IN SOLN
3.0000 mL | Freq: Four times a day (QID) | RESPIRATORY_TRACT | Status: DC
Start: 2021-01-04 — End: 2021-01-04
  Administered 2021-01-04: 21:00:00 3 mL via RESPIRATORY_TRACT
  Filled 2021-01-04: qty 3

## 2021-01-04 MED ORDER — LACTATED RINGERS IV SOLN: INTRAVENOUS | Status: AC

## 2021-01-04 MED ORDER — ACETAMINOPHEN 500 MG PO TABS: 500.0000 mg | ORAL_TABLET | Freq: Four times a day (QID) | ORAL | Status: DC | PRN

## 2021-01-04 MED ORDER — ALBUTEROL SULFATE (2.5 MG/3ML) 0.083% IN NEBU: 2.5000 mg | INHALATION_SOLUTION | Freq: Once | RESPIRATORY_TRACT | Status: DC

## 2021-01-04 MED ORDER — KETOROLAC TROMETHAMINE 0.5 % OP SOLN
1.0000 [drp] | Freq: Four times a day (QID) | OPHTHALMIC | Status: DC
  Administered 2021-01-05 – 2021-01-10 (×20): 1 [drp] via OPHTHALMIC
  Filled 2021-01-04: qty 3

## 2021-01-04 NOTE — H&P (Signed)
History and Physical  ELIZETH WEINRICH VOJ:[id number] DOB: 1928-05-11 DOA: 01/04/2021  Referring physician: Deno Etienne, PA-C, ED PCP: Dettinger, Fransisca Kaufmann, MD  Outpatient Specialists:   Patient Coming From: home  Chief Complaint: Shortness of breath, cough, confusion  HPI: GLENDA SPELMAN is a 87 y.o. female with a history of Alzheimer's dementia, hypothyroidism, COPD with chronic respiratory failure on 4 L nasal cannula, coronary artery disease, GERD, hypertension.  Due to her dementia, history is obtained by the patient's daughter and by the ER physician.  Patient has been having cough, shortness of breath, fevers, chills over the past 4 to 5 days.  She was seen by her primary care doctor yesterday and was diagnosed with bronchitis.  She was started on steroids and doxycycline.  She got 1 dose of that and her symptoms have been continued to worsen and the patient was brought here by EMS.  In route, the patient got DuoNeb plus Solu-Medrol.  She has been having to increase her nebulizer treatment.  Emergency Department Course: Patient receive 3 nebulizer treatments.  She also received antibiotics: Rocephin and azithromycin after blood cultures were obtained.  She was given a liter of IV fluids.  White count 13.  Troponin stable at 44.  Creatinine 0.74.  Sodium 123.  Lactic acid 1.3.  Review of Systems:  Patient unable to provide.  Past Medical History:  Diagnosis Date   Alzheimer's disease ([place]) 03/29/2015   Anxiety    Aortic stenosis, mild    Arthritis    Asthma    Back pain    Breast cancer (HCC)    Carotid artery disease ([place]) 01/01/2017   COPD (chronic obstructive pulmonary disease) (HCC)    Dementia    Depression    GERD (gastroesophageal reflux disease)    H/O hiatal hernia    Hypertension    Hypothyroidism 06/25/2016   Lymphadenopathy 10/07/2012   Obesity    Oxygen dependent    Pneumonia    Right hamstring muscle strain 04/26/2013   Sleep apnea    Past Surgical  History:  Procedure Laterality Date   APPENDECTOMY     BREAST RECONSTRUCTION     CARDIAC CATHETERIZATION     EF 55-60%   MASTECTOMY  1980   TONSILLECTOMY AND ADENOIDECTOMY  1942   Social History:  reports that she quit smoking about 30 years ago. Her smoking use included cigarettes. She has a 40.00 pack-year smoking history. She has never used smokeless tobacco. She reports current alcohol use of about 7.0 standard drinks per week. She reports that she does not use drugs. Patient lives at home  No Known Allergies  Family History  Problem Relation Age of Onset   Tuberculosis Mother    Lung cancer Father    Parkinson's disease Brother    Parkinson's disease Daughter       Prior to Admission medications   Medication Sig Start Date End Date Taking? Authorizing Provider  acetaminophen (TYLENOL) 500 MG tablet Take 500 mg by mouth every 6 (six) hours as needed (pain).    Yes [provider]  ARIPiprazole (ABILIFY) 2 MG tablet Take 1 tablet (2 mg total) by mouth daily. 11/01/20  Yes Dettinger, Fransisca Kaufmann, MD  aspirin 81 MG tablet Take 81 mg by mouth daily.   Yes [provider]  atorvastatin (LIPITOR) 40 MG tablet Take 1 tablet (40 mg total) by mouth daily. Patient taking differently: Take 1 tablet by mouth at bedtime. 05/02/20  Yes Dettinger, Fransisca Kaufmann, MD  betaxolol (BETOPTIC-S) 0.25 % ophthalmic suspension Place 1 drop into both eyes every morning.   Yes [provider]  brimonidine (ALPHAGAN) 0.2 % ophthalmic solution Place 1 drop into both eyes daily.   Yes [provider]  budesonide (PULMICORT) 0.5 MG/2ML nebulizer solution NEBULIZE 1 VIAL TWICE A DAY 04/19/20  Yes Tanda Rockers, MD  buPROPion (WELLBUTRIN SR) 200 MG 12 hr tablet Take 1 tablet (200 mg total) by mouth 2 (two) times daily. 11/01/20  Yes Dettinger, Fransisca Kaufmann, MD  chlorpheniramine (CHLOR-TRIMETON) 4 MG tablet Take 4 mg by mouth every 4 (four) hours as needed (drippy nose, drainage).   Yes  [provider]  Cholecalciferol (VITAMIN D) 50 MCG (2000 UT) tablet Take 2,000 Units by mouth daily.   Yes [provider]  citalopram (CELEXA) 20 MG tablet Take 1 tablet (20 mg total) by mouth daily. 11/01/20  Yes Dettinger, Fransisca Kaufmann, MD  dextromethorphan (DELSYM) 30 MG/5ML liquid Take 30 mg 2 (two) times daily as needed by mouth for cough.   Yes [provider]  dextromethorphan-guaiFENesin (MUCINEX DM) 30-600 MG 12hr tablet Take 1 tablet 2 (two) times daily as needed by mouth for cough.   Yes [provider]  doxycycline (VIBRA-TABS) 100 MG tablet Take 1 tablet (100 mg total) by mouth 2 (two) times daily for 7 days. 01/02/21 01/09/21 Yes Gottschalk, Ashly M, DO  fenofibrate micronized (LOFIBRA) 134 MG capsule TAKE (1) CAPSULE DAILY BEFORE BREAKFAST. 01/01/21  Yes Hochrein, Jeneen Rinks, MD  ipratropium-albuterol (DUONEB) 0.5-2.5 (3) MG/3ML SOLN NEBULIZE 1 VIAL EVERY 6 HOURS AS NEEDED 12/10/20  Yes Tanda Rockers, MD  ketorolac (ACULAR) 0.5 % ophthalmic solution Place 1 drop into both eyes 4 (four) times daily.   Yes [provider]  levothyroxine (SYNTHROID) 50 MCG tablet Take 1 tablet (50 mcg total) by mouth daily. 11/01/20  Yes Dettinger, Fransisca Kaufmann, MD  meclizine (ANTIVERT) 25 MG tablet Take 25 mg by mouth every 8 (eight) hours as needed for dizziness.   Yes [provider]  memantine (NAMENDA XR) 28 MG CP24 24 hr capsule Take 1 capsule (28 mg total) by mouth daily. 11/01/20  Yes Dettinger, Fransisca Kaufmann, MD  metoprolol tartrate (LOPRESSOR) 25 MG tablet TAKE (1/2) TABLET TWICE DAILY. 08/20/20  Yes Minus Breeding, MD  montelukast (SINGULAIR) 10 MG tablet Take 1 tablet (10 mg total) by mouth at bedtime. 11/01/20  Yes Dettinger, Fransisca Kaufmann, MD  omeprazole (PRILOSEC) 20 MG capsule TAKE  (1)  CAPSULE  TWICE DAILY (TAKE ON AN EMPTY STOMACH AT LEAST 30 MIN- UTES BEFORE MEALS). 09/17/20  Yes Tanda Rockers, MD  predniSONE (DELTASONE) 20 MG tablet 2 po at same time  daily for 5 days 01/02/21  Yes Gottschalk, Ashly M, DO  PROAIR HFA 108 (90 Base) MCG/ACT inhaler INHALE 2 PUFFS BY MOUTH EVERY 6 HOURS AS NEEDED 08/22/15  Yes Tanda Rockers, MD  RHOPRESSA 0.02 % SOLN Place 1 drop into both eyes at bedtime. 08/05/16  Yes [provider]  sodium chloride (OCEAN) 0.65 % SOLN nasal spray 2 puffs every 4-6 hours as needed for nasal congestion   Yes [provider]  traZODone (DESYREL) 50 MG tablet TAKE 1/2 TO 1 TABLET AT BEDTIME AS NEEDED FOR SLEEP Patient taking differently: Take 50 mg by mouth at bedtime. TAKE 1/2 TO 1 TABLET AT BEDTIME AS NEEDED FOR SLEEP 05/02/20  Yes Dettinger, Fransisca Kaufmann, MD  valsartan-hydrochlorothiazide (DIOVAN-HCT) 80-12.5 MG tablet Take 1 tablet by mouth daily. 11/01/20  Yes Dettinger, Fransisca Kaufmann, MD  OXYGEN Oxygen 4l pm 24/7    [provider]  Respiratory Therapy Supplies (FLUTTER) DEVI As needed    [provider]  Respiratory Therapy Supplies (NEBULIZER MASK ADULT) MISC 1 each by Does not apply route every 6 (six) weeks. 01/02/21   Janora Norlander, DO    Physical Exam: BP (!) 161/130    Pulse (!) 124    Temp 98.4 F (36.9 C) (Rectal)    Resp (!) 27    Ht 5\' 5"  (1.651 m)    Wt 55.8 kg    SpO2 95%    BMI 20.47 kg/m   General: Elderly female. Awake and alert. No acute cardiopulmonary distress.  HEENT: Normocephalic atraumatic.  Right and left ears normal in appearance.  Pupils equal, round, reactive to light. Extraocular muscles are intact. Sclerae anicteric and noninjected.  Moist mucosal membranes. No mucosal lesions.  Neck: Neck supple without lymphadenopathy. No carotid bruits. No masses palpated.  Cardiovascular: Cardiac rate with normal S1-S2 sounds. No murmurs, rubs, gallops auscultated. No JVD.  Respiratory: Coarse breath sounds throughout.  Mild wheezing throughout.  No accessory muscle use. Abdomen: Soft, nontender, nondistended. Active bowel sounds. No masses or hepatosplenomegaly  Skin: No  rashes, lesions, or ulcerations.  Dry, warm to touch. 2+ dorsalis pedis and radial pulses. Musculoskeletal: No calf or leg pain. All major joints not erythematous nontender.  No upper or lower joint deformation.  Good ROM.  No contractures  Psychiatric: Patient lacks judgment and insight.  Fairly pleasant on my exam Neurologic: Patient unable to follow exams however no focal deficits observed           Labs on Admission: I have personally reviewed following labs and imaging studies  CBC: Recent Labs  Lab 01/04/21 0955  WBC 13.0*  NEUTROABS 11.9*  HGB 10.1*  HCT 30.5*  MCV 88.7  PLT 354   Basic Metabolic Panel: Recent Labs  Lab 01/04/21 0955  NA 123*  K 3.5  CL 87*  CO2 27  GLUCOSE 115*  BUN 21  CREATININE 0.74  CALCIUM 8.9   GFR: Estimated Creatinine Clearance: 41.2 mL/min (by C-G formula based on SCr of 0.74 mg/dL). Liver Function Tests: Recent Labs  Lab 01/04/21 0955  AST 92*  ALT 43  ALKPHOS 46  BILITOT 1.0  PROT 6.8  ALBUMIN 3.7   No results for input(s): LIPASE, AMYLASE in the last 168 hours. No results for input(s): AMMONIA in the last 168 hours. Coagulation Profile: Recent Labs  Lab 01/04/21 0955  INR 1.0   Cardiac Enzymes: No results for input(s): CKTOTAL, CKMB, CKMBINDEX, TROPONINI in the last 168 hours. BNP (last 3 results) No results for input(s): PROBNP in the last 8760 hours. HbA1C: No results for input(s): HGBA1C in the last 72 hours. CBG: No results for input(s): GLUCAP in the last 168 hours. Lipid Profile: No results for input(s): CHOL, HDL, LDLCALC, TRIG, CHOLHDL, LDLDIRECT in the last 72 hours. Thyroid Function Tests: No results for input(s): TSH, T4TOTAL, FREET4, T3FREE, THYROIDAB in the last 72 hours. Anemia Panel: No results for input(s): VITAMINB12, FOLATE, FERRITIN, TIBC, IRON, RETICCTPCT in the last 72 hours. Urine analysis:    Component Value Date/Time   COLORURINE YELLOW 01/04/2021 [place] 01/04/2021  1204   APPEARANCEUR Clear 06/17/2019 1122   LABSPEC 1.015 01/04/2021 1204   PHURINE 6.0 01/04/2021 1204   GLUCOSEU NEGATIVE 01/04/2021 1204   HGBUR SMALL (A) 01/04/2021 [place] 01/04/2021  [place] Negative 06/17/2019 1122   [place] 01/04/2021 1204   PROTEINUR 30 (A) 01/04/2021 1204   UROBILINOGEN negative 06/07/2012 1606   NITRITE NEGATIVE 01/04/2021 [place] 01/04/2021 1204   Sepsis Labs: @LABRCNTIP (procalcitonin:4,lacticidven:4) ) Recent Results (from the past 240 hour(s))  Blood culture (routine x 2)     Status: None (Preliminary result)   Collection Time: 01/04/21  9:55 AM   Specimen: Right Antecubital; Blood  Result Value Ref Range Status   Specimen Description RIGHT ANTECUBITAL  Final   Special Requests   Final    BOTTLES DRAWN AEROBIC AND ANAEROBIC Blood Culture adequate volume Performed at [facility], [address] [place], [place] [address]    Culture PENDING  Incomplete   Report Status PENDING  Incomplete  Blood culture (routine x 2)     Status: None (Preliminary result)   Collection Time: 01/04/21 10:29 AM   Specimen: BLOOD RIGHT FOREARM  Result Value Ref Range Status   Specimen Description BLOOD RIGHT FOREARM  Final   Special Requests   Final    BOTTLES DRAWN AEROBIC AND ANAEROBIC Blood Culture adequate volume Performed at [facility], [address] [place],  [address]    Culture PENDING  Incomplete   Report Status PENDING  Incomplete  Resp Panel by RT-PCR (Flu A&B, Covid) Urine, Clean Catch     Status: None   Collection Time: 01/04/21 12:04 PM   Specimen: Urine, Clean Catch; Nasopharyngeal(NP) swabs in vial transport medium  Result Value Ref Range Status   SARS Coronavirus 2 by RT PCR NEGATIVE NEGATIVE Final    Comment: (NOTE) SARS-CoV-2 target nucleic acids are NOT DETECTED.  The SARS-CoV-2 RNA is generally detectable in upper respiratory specimens during the acute phase of  infection. The lowest concentration of SARS-CoV-2 viral copies this assay can detect is 138 copies/mL. A negative result does not preclude SARS-Cov-2 infection and should not be used as the sole basis for treatment or other patient management decisions. A negative result may occur with  improper specimen collection/handling, submission of specimen other than nasopharyngeal swab, presence of viral mutation(s) within the areas targeted by this assay, and inadequate number of viral copies(<138 copies/mL). A negative result must be combined with clinical observations, patient history, and epidemiological information. The expected result is Negative.  Fact Sheet for Patients:  EntrepreneurPulse.com.au  Fact Sheet for Healthcare Providers:  IncredibleEmployment.be  This test is no t yet approved or cleared by the [place] FDA and  has been authorized for detection and/or diagnosis of SARS-CoV-2 by FDA under an Emergency Use Authorization (EUA). This EUA will remain  in effect (meaning this test can be used) for the duration of the COVID-19 declaration under Section 564(b)(1) of the Act, 21 U.S.C.section 360bbb-3(b)(1), unless the authorization is terminated  or revoked sooner.       Influenza A by PCR NEGATIVE NEGATIVE Final   Influenza B by PCR NEGATIVE NEGATIVE Final    Comment: (NOTE) The Xpert Xpress SARS-CoV-2/FLU/RSV plus assay is intended as an aid in the diagnosis of influenza from Nasopharyngeal swab specimens and should not be used as a sole basis for treatment. Nasal washings and aspirates are unacceptable for Xpert Xpress SARS-CoV-2/FLU/RSV testing.  Fact Sheet for Patients: EntrepreneurPulse.com.au  Fact Sheet for Healthcare Providers: IncredibleEmployment.be  This test is not yet approved or cleared by the [place] FDA and has been authorized for detection and/or diagnosis of SARS-CoV-2  by FDA under an Emergency Use Authorization (EUA).  This EUA will remain in effect (meaning this test can be used) for the duration of the COVID-19 declaration under Section 564(b)(1) of the Act, 21 U.S.C. section 360bbb-3(b)(1), unless the authorization is terminated or revoked.  Performed at [facility], [address] [place], [place] [address]      Radiological Exams on Admission: CT Head Wo Contrast  Result Date: 01/04/2021 CLINICAL DATA:  TIA EXAM: CT HEAD WITHOUT CONTRAST TECHNIQUE: Contiguous axial images were obtained from the base of the skull through the vertex without intravenous contrast. COMPARISON:  03/15/2012 FINDINGS: Brain: No acute intracranial findings are seen. There are no signs of bleeding. Ventricles are not dilated. There is no shift of midline structures. Cortical sulci are prominent. There is decreased density in the periventricular white matter. Vascular: There are scattered arterial calcifications. Skull: Unremarkable. Sinuses/Orbits: Unremarkable. Other: None IMPRESSION: No acute intracranial findings are seen in the noncontrast CT brain. Atrophy. Small-vessel disease. Electronically Signed   By: Elmer Picker M.D.   On: 01/04/2021 12:32   CT Chest Wo Contrast  Result Date: 01/04/2021 CLINICAL DATA:  Weakness and shortness of breath EXAM: CT CHEST WITHOUT CONTRAST TECHNIQUE: Multidetector CT imaging of the chest was performed following the standard protocol without IV contrast. COMPARISON:  None. FINDINGS: Cardiovascular: Normal heart size. No pericardial effusion. Three-vessel coronary artery calcifications. Atherosclerotic disease of the thoracic aorta. Mediastinum/Nodes: No pathologically enlarged lymph nodes seen in the chest. Esophagus is unremarkable. Lungs/Pleura: Central airways are patent. Centrilobular emphysema. Irregular nodular opacity of the right upper lobe measuring 2.1 x 1.0 cm on series 4, image 43 with adjacent linear opacities. Linear  opacity of the left upper lobe, likely due to scarring. Upper Abdomen: No acute abnormality. Musculoskeletal: No chest wall mass or suspicious bone lesions identified. IMPRESSION: 1. Nodular linear consolidation of the right upper lobe, possibly infectious or inflammatory, although neoplasm could also have this appearance. Recommend follow-up chest CT in 1 month to ensure resolution. If finding does not resolve, consultation with Pulmonology or Thoracic Surgery recommended. 2. Coronary artery calcifications. 3. Aortic Atherosclerosis (ICD10-I70.0) and Emphysema (ICD10-J43.9). Electronically Signed   By: Yetta Glassman M.D.   On: 01/04/2021 12:50   DG Chest Portable 1 View  Result Date: 01/04/2021 CLINICAL DATA:  Shortness of breath EXAM: PORTABLE CHEST 1 VIEW COMPARISON:  01/01/2018 FINDINGS: The heart size and mediastinal contours are within normal limits. Background COPD with chronic interstitial changes. New small irregular opacity is present in the upper right lung. No pleural effusion or pneumothorax. The visualized skeletal structures are unremarkable. IMPRESSION: Small irregular opacity in the upper right lung. Chest CT recommended to exclude malignancy. Electronically Signed   By: Macy Mis M.D.   On: 01/04/2021 09:58    EKG: Independently reviewed.  Sinus rhythm with intraventricular conduction delay and LAD.  No acute ST changes.  Assessment/Plan: Principal Problem:   Acute respiratory failure with hypoxia (HCC) Active Problems:   HTN (hypertension)   Chronic respiratory failure with hypercapnia (HCC)   Alzheimer's disease ([place])   Hypothyroidism   Oxygen dependent   GERD (gastroesophageal reflux disease)   Carotid artery disease ([place])    This patient was discussed with the ED physician, including pertinent vitals, physical exam findings, labs, and imaging.  We also discussed care given by the ED provider.  Acute on chronic respiratory failure with hypoxia Pneumonia and COPD  exacerbation Antibiotics: rocephin and azithromycin Robitussin Blood cultures drawn in the emergency department Sputum cultures CBC tomorrow Strep and Legionella antigen by urine COVID  Influenza screen negative DuoNeb's every 6 scheduled with albuterol every 2 when necessary Continue inhaled steroids and LA bronchodilator Solu-Medrol 60 mg IV every 12 hours Hyponatremia Status post bolus.  Will check sodium every 6 hours. Check urine sodium and urine osmolality Hypertension Continue home medication Hypothyroidism Check TSH Dementia   DVT prophylaxis: Lovenox Consultants:  Code Status: DNR confirmed by patient's daughter Family Communication: Patient's daughter by phone Disposition Plan: Pending   Truett Mainland, DO

## 2021-01-04 NOTE — ED Notes (Signed)
Both set of cultures obtained before antibiotic administration

## 2021-01-04 NOTE — ED Notes (Signed)
Attempted twice to call materials for [employer]-hugger equipment, unable to reach them at this time.

## 2021-01-04 NOTE — ED Provider Notes (Addendum)
[facility] Provider Note   CSN: [id number] Arrival date & time: 01/04/21  4174     History Chief Complaint  Patient presents with   Cough    Brought in by EMS for cough for 3 days.  History of dementia.  Pt is from home with DNR papers at bedside.  Called tele drs, got antibiotic but has not helped.  Weakness noted yesterday, daughter reports pt was leaning right also yesterday.  Having difficulty walking yesterday.  History of COPD.  Breathing tx given by family, EMS noted rhonci  Sats 90's with home o2 @ 4l.  DUO neb given by EMS with improvement  noted.  Given Solumedrol 125 mg IV by EMS.      Katie Bryant is a 86 y.o. female.  HPI  HPI will be deferred due to level 5 caveat dementia  Patient with medical history including dementia, COPD, chronic respiratory failure on 4 L via nasal cannula, hypertension, DNR presents with complaints of shortness of breath and altered mental status.  EMS has provided DuoNeb as well as Solu-Medrol while in route.  Daughter was at bedside able to provide HPI.  Daughter states that starting on Monday patient started to have fevers, chills, congestion, productive cough, increased difficulty with breathing.  She states that the patient was exposed to someone who had a viral-like illness, thinks possibly could have been the flu. the patient is up-to-date on her COVID and influenza vaccine, daughter states states that patient has had to increase her supplemental oxygen from 4 to 4.5.    She also notes that starting yesterday around noon the patient's was having difficulty walking, she states that the patient appears to be very stiff and rigid, at baseline she normally walk with a walker and can generally get in and out of the bath without difficulty but  unable to get of the bathtub yesterday.  She also notes that the patient started to lean to her right side she typically is on her left side, she feels that her speech is slightly garbled  and she appears more confused.  There is been no recent head trauma, not on anticoagulant, no history of CVAs in the past.  Past Medical History:  Diagnosis Date   Alzheimer's disease ([place]) 03/29/2015   Anxiety    Aortic stenosis, mild    Arthritis    Asthma    Back pain    Breast cancer (HCC)    Carotid artery disease ([place]) 01/01/2017   COPD (chronic obstructive pulmonary disease) (HCC)    Dementia    Depression    GERD (gastroesophageal reflux disease)    H/O hiatal hernia    Hypertension    Hypothyroidism 06/25/2016   Lymphadenopathy 10/07/2012   Obesity    Oxygen dependent    Pneumonia    Right hamstring muscle strain 04/26/2013   Sleep apnea     Patient Active Problem List   Diagnosis Date Noted   Acute respiratory failure with hypoxia ([place]) 01/04/2021   Upper airway cough syndrome 02/08/2020   Carotid artery disease ([place]) 01/01/2017   Frequent falls 01/01/2017   Edema of both feet 01/01/2017   Sleep apnea    Oxygen dependent    Obesity    H/O hiatal hernia    GERD (gastroesophageal reflux disease)    Breast cancer (HCC)    Back pain    Arthritis    Aortic stenosis, mild    Anxiety    Lewy body dementia ([place])  09/18/2016   Hypothyroidism 06/25/2016   Depression, recurrent ([place]) 04/25/2015   Alzheimer's disease ([place]) 03/29/2015   Chronic respiratory failure with hypercapnia ([place]) 03/11/2011   HTN (hypertension) 02/12/2011   Hyperlipidemia 02/12/2011   Insomnia 12/22/2010   COPD GOLD III/ 02 dep  05/25/2007    Past Surgical History:  Procedure Laterality Date   APPENDECTOMY     BREAST RECONSTRUCTION     CARDIAC CATHETERIZATION     EF 55-60%   MASTECTOMY  1980   TONSILLECTOMY AND ADENOIDECTOMY  1942     OB History   No obstetric history on file.     Family History  Problem Relation Age of Onset   Tuberculosis Mother    Lung cancer Father    Parkinson's disease Brother    Parkinson's disease Daughter     Social History   Tobacco Use    Smoking status: Former    Packs/day: 1.00    Years: 40.00    Pack years: 40.00    Types: Cigarettes    Quit date: 02/11/1990    Years since quitting: 30.9   Smokeless tobacco: Never  Vaping Use   Vaping Use: Never used  Substance Use Topics   Alcohol use: Yes    Alcohol/week: 7.0 standard drinks    Types: 7 Glasses of wine per week   Drug use: No    Home Medications Prior to Admission medications   Medication Sig Start Date End Date Taking? Authorizing Provider  acetaminophen (TYLENOL) 500 MG tablet Take 500 mg by mouth every 6 (six) hours as needed (pain).    Yes [provider]  ARIPiprazole (ABILIFY) 2 MG tablet Take 1 tablet (2 mg total) by mouth daily. 11/01/20  Yes Dettinger, Fransisca Kaufmann, MD  aspirin 81 MG tablet Take 81 mg by mouth daily.   Yes [provider]  atorvastatin (LIPITOR) 40 MG tablet Take 1 tablet (40 mg total) by mouth daily. Patient taking differently: Take 1 tablet by mouth at bedtime. 05/02/20  Yes Dettinger, Fransisca Kaufmann, MD  betaxolol (BETOPTIC-S) 0.25 % ophthalmic suspension Place 1 drop into both eyes every morning.   Yes [provider]  brimonidine (ALPHAGAN) 0.2 % ophthalmic solution Place 1 drop into both eyes daily.   Yes [provider]  budesonide (PULMICORT) 0.5 MG/2ML nebulizer solution NEBULIZE 1 VIAL TWICE A DAY 04/19/20  Yes Tanda Rockers, MD  buPROPion (WELLBUTRIN SR) 200 MG 12 hr tablet Take 1 tablet (200 mg total) by mouth 2 (two) times daily. 11/01/20  Yes Dettinger, Fransisca Kaufmann, MD  chlorpheniramine (CHLOR-TRIMETON) 4 MG tablet Take 4 mg by mouth every 4 (four) hours as needed (drippy nose, drainage).   Yes [provider]  Cholecalciferol (VITAMIN D) 50 MCG (2000 UT) tablet Take 2,000 Units by mouth daily.   Yes [provider]  citalopram (CELEXA) 20 MG tablet Take 1 tablet (20 mg total) by mouth daily. 11/01/20  Yes Dettinger, Fransisca Kaufmann, MD  dextromethorphan (DELSYM) 30 MG/5ML liquid Take 30 mg 2  (two) times daily as needed by mouth for cough.   Yes [provider]  dextromethorphan-guaiFENesin (MUCINEX DM) 30-600 MG 12hr tablet Take 1 tablet 2 (two) times daily as needed by mouth for cough.   Yes [provider]  doxycycline (VIBRA-TABS) 100 MG tablet Take 1 tablet (100 mg total) by mouth 2 (two) times daily for 7 days. 01/02/21 01/09/21 Yes Gottschalk, Ashly M, DO  fenofibrate micronized (LOFIBRA) 134 MG capsule TAKE (1) CAPSULE DAILY BEFORE  BREAKFAST. 01/01/21  Yes Hochrein, Jeneen Rinks, MD  ipratropium-albuterol (DUONEB) 0.5-2.5 (3) MG/3ML SOLN NEBULIZE 1 VIAL EVERY 6 HOURS AS NEEDED 12/10/20  Yes Tanda Rockers, MD  ketorolac (ACULAR) 0.5 % ophthalmic solution Place 1 drop into both eyes 4 (four) times daily.   Yes [provider]  levothyroxine (SYNTHROID) 50 MCG tablet Take 1 tablet (50 mcg total) by mouth daily. 11/01/20  Yes Dettinger, Fransisca Kaufmann, MD  meclizine (ANTIVERT) 25 MG tablet Take 25 mg by mouth every 8 (eight) hours as needed for dizziness.   Yes [provider]  memantine (NAMENDA XR) 28 MG CP24 24 hr capsule Take 1 capsule (28 mg total) by mouth daily. 11/01/20  Yes Dettinger, Fransisca Kaufmann, MD  metoprolol tartrate (LOPRESSOR) 25 MG tablet TAKE (1/2) TABLET TWICE DAILY. 08/20/20  Yes Minus Breeding, MD  montelukast (SINGULAIR) 10 MG tablet Take 1 tablet (10 mg total) by mouth at bedtime. 11/01/20  Yes Dettinger, Fransisca Kaufmann, MD  omeprazole (PRILOSEC) 20 MG capsule TAKE  (1)  CAPSULE  TWICE DAILY (TAKE ON AN EMPTY STOMACH AT LEAST 30 MIN- UTES BEFORE MEALS). 09/17/20  Yes Tanda Rockers, MD  predniSONE (DELTASONE) 20 MG tablet 2 po at same time daily for 5 days 01/02/21  Yes Gottschalk, Ashly M, DO  PROAIR HFA 108 (90 Base) MCG/ACT inhaler INHALE 2 PUFFS BY MOUTH EVERY 6 HOURS AS NEEDED 08/22/15  Yes Tanda Rockers, MD  RHOPRESSA 0.02 % SOLN Place 1 drop into both eyes at bedtime. 08/05/16  Yes [provider]  sodium chloride (OCEAN) 0.65 % SOLN  nasal spray 2 puffs every 4-6 hours as needed for nasal congestion   Yes [provider]  traZODone (DESYREL) 50 MG tablet TAKE 1/2 TO 1 TABLET AT BEDTIME AS NEEDED FOR SLEEP Patient taking differently: Take 50 mg by mouth at bedtime. TAKE 1/2 TO 1 TABLET AT BEDTIME AS NEEDED FOR SLEEP 05/02/20  Yes Dettinger, Fransisca Kaufmann, MD  valsartan-hydrochlorothiazide (DIOVAN-HCT) 80-12.5 MG tablet Take 1 tablet by mouth daily. 11/01/20  Yes Dettinger, Fransisca Kaufmann, MD  OXYGEN Oxygen 4l pm 24/7    [provider]  Respiratory Therapy Supplies (FLUTTER) DEVI As needed    [provider]  Respiratory Therapy Supplies (NEBULIZER MASK ADULT) MISC 1 each by Does not apply route every 6 (six) weeks. 01/02/21   Janora Norlander, DO    Allergies    Patient has no known allergies.  Review of Systems   Review of Systems  Unable to perform ROS: Dementia   Physical Exam Updated Vital Signs BP (!) 161/130    Pulse (!) 124    Temp 98.4 F (36.9 C) (Rectal)    Resp (!) 27    Ht 5\' 5"  (1.651 m)    Wt 55.8 kg    SpO2 95%    BMI 20.47 kg/m   Physical Exam Vitals and nursing note reviewed.  Constitutional:      General: She is not in acute distress.    Appearance: She is not ill-appearing.     Comments: Patient is ill-appearing in a deconditioned state, alert  HENT:     Head: Normocephalic and atraumatic.     Nose: No congestion.     Mouth/Throat:     Mouth: Mucous membranes are dry.     Pharynx: Oropharynx is clear.  Eyes:     Conjunctiva/sclera: Conjunctivae normal.  Cardiovascular:     Rate and Rhythm: Normal rate and regular rhythm.  Pulses: Normal pulses.     Heart sounds: No murmur heard.   No friction rub. No gallop.  Pulmonary:     Comments: Patient not appear to be in respiratory distress, she is on 4 L via nasal cannula, O2 sats in the upper 90s, able to speak in full sentences, slightly tachypneic, lung sounds reveal tight sounding, intermittent wheezing present, no  rales or rhonchi present Chest:     Chest wall: No tenderness.  Abdominal:     Palpations: Abdomen is soft.     Tenderness: There is no abdominal tenderness. There is no right CVA tenderness or left CVA tenderness.  Musculoskeletal:     Right lower leg: No edema.     Left lower leg: No edema.  Skin:    General: Skin is warm and dry.  Neurological:     Mental Status: She is alert.     Cranial Nerves: No facial asymmetry.     Motor: Atrophy present.     Comments: Difficult to perform neuro exam due to dementia but there is no obvious facial asymmetry, EOMs were intact,no difficult word finding, no slurring of her words, able to follow two-step commands, no noted unilateral weakness, but appears globally weak 3/5 strength, she is leaning to her right side.   Psychiatric:        Mood and Affect: Mood normal.    ED Results / Procedures / Treatments   Labs (all labs ordered are listed, but only abnormal results are displayed) Labs Reviewed  CBC WITH DIFFERENTIAL/PLATELET - Abnormal; Notable for the following components:      Result Value   WBC 13.0 (*)    RBC 3.44 (*)    Hemoglobin 10.1 (*)    HCT 30.5 (*)    Neutro Abs 11.9 (*)    Lymphs Abs 0.4 (*)    All other components within normal limits  COMPREHENSIVE METABOLIC PANEL - Abnormal; Notable for the following components:   Sodium 123 (*)    Chloride 87 (*)    Glucose, Bld 115 (*)    AST 92 (*)    All other components within normal limits  BRAIN NATRIURETIC PEPTIDE - Abnormal; Notable for the following components:   B Natriuretic Peptide 205.0 (*)    All other components within normal limits  URINALYSIS, ROUTINE W REFLEX MICROSCOPIC - Abnormal; Notable for the following components:   Hgb urine dipstick SMALL (*)    Protein, ur 30 (*)    All other components within normal limits  BLOOD GAS, VENOUS - Abnormal; Notable for the following components:   pO2, Ven 46.4 (*)    Acid-Base Excess 4.8 (*)    All other components  within normal limits  TROPONIN I (HIGH SENSITIVITY) - Abnormal; Notable for the following components:   Troponin I (High Sensitivity) 39 (*)    All other components within normal limits  TROPONIN I (HIGH SENSITIVITY) - Abnormal; Notable for the following components:   Troponin I (High Sensitivity) 44 (*)    All other components within normal limits  RESP PANEL BY RT-PCR (FLU A&B, COVID) ARPGX2  CULTURE, BLOOD (ROUTINE X 2)  CULTURE, BLOOD (ROUTINE X 2)  LACTIC ACID, PLASMA  PROTIME-INR  APTT  SODIUM, URINE, RANDOM  OSMOLALITY, URINE    EKG None  Radiology CT Head Wo Contrast  Result Date: 01/04/2021 CLINICAL DATA:  TIA EXAM: CT HEAD WITHOUT CONTRAST TECHNIQUE: Contiguous axial images were obtained from the base of the skull through the vertex without intravenous  contrast. COMPARISON:  03/15/2012 FINDINGS: Brain: No acute intracranial findings are seen. There are no signs of bleeding. Ventricles are not dilated. There is no shift of midline structures. Cortical sulci are prominent. There is decreased density in the periventricular white matter. Vascular: There are scattered arterial calcifications. Skull: Unremarkable. Sinuses/Orbits: Unremarkable. Other: None IMPRESSION: No acute intracranial findings are seen in the noncontrast CT brain. Atrophy. Small-vessel disease. Electronically Signed   By: Elmer Picker M.D.   On: 01/04/2021 12:32   CT Chest Wo Contrast  Result Date: 01/04/2021 CLINICAL DATA:  Weakness and shortness of breath EXAM: CT CHEST WITHOUT CONTRAST TECHNIQUE: Multidetector CT imaging of the chest was performed following the standard protocol without IV contrast. COMPARISON:  None. FINDINGS: Cardiovascular: Normal heart size. No pericardial effusion. Three-vessel coronary artery calcifications. Atherosclerotic disease of the thoracic aorta. Mediastinum/Nodes: No pathologically enlarged lymph nodes seen in the chest. Esophagus is unremarkable. Lungs/Pleura: Central  airways are patent. Centrilobular emphysema. Irregular nodular opacity of the right upper lobe measuring 2.1 x 1.0 cm on series 4, image 43 with adjacent linear opacities. Linear opacity of the left upper lobe, likely due to scarring. Upper Abdomen: No acute abnormality. Musculoskeletal: No chest wall mass or suspicious bone lesions identified. IMPRESSION: 1. Nodular linear consolidation of the right upper lobe, possibly infectious or inflammatory, although neoplasm could also have this appearance. Recommend follow-up chest CT in 1 month to ensure resolution. If finding does not resolve, consultation with Pulmonology or Thoracic Surgery recommended. 2. Coronary artery calcifications. 3. Aortic Atherosclerosis (ICD10-I70.0) and Emphysema (ICD10-J43.9). Electronically Signed   By: Yetta Glassman M.D.   On: 01/04/2021 12:50   DG Chest Portable 1 View  Result Date: 01/04/2021 CLINICAL DATA:  Shortness of breath EXAM: PORTABLE CHEST 1 VIEW COMPARISON:  01/01/2018 FINDINGS: The heart size and mediastinal contours are within normal limits. Background COPD with chronic interstitial changes. New small irregular opacity is present in the upper right lung. No pleural effusion or pneumothorax. The visualized skeletal structures are unremarkable. IMPRESSION: Small irregular opacity in the upper right lung. Chest CT recommended to exclude malignancy. Electronically Signed   By: Macy Mis M.D.   On: 01/04/2021 09:58    Procedures .Critical Care Performed by: Marcello Fennel, PA-C Authorized by: Marcello Fennel, PA-C   Critical care provider statement:    Critical care time (minutes):  30   Critical care time was exclusive of:  Separately billable procedures and treating other patients   Critical care was necessary to treat or prevent imminent or life-threatening deterioration of the following conditions:  Metabolic crisis and sepsis   Critical care was time spent personally by me on the following  activities:  Development of treatment plan with patient or surrogate, discussions with consultants, evaluation of patient's response to treatment, examination of patient, ordering and review of laboratory studies, ordering and review of radiographic studies, ordering and performing treatments and interventions, pulse oximetry, re-evaluation of patient's condition and review of old charts   I assumed direction of critical care for this patient from another provider in my specialty: no     Care discussed with: admitting provider     Medications Ordered in ED Medications  lactated ringers infusion ( Intravenous New Bag/Given 01/04/21 1036)  cefTRIAXone (ROCEPHIN) 2 g in sodium chloride 0.9 % 100 mL IVPB (0 g Intravenous Stopped 01/04/21 1152)  azithromycin (ZITHROMAX) 500 mg in sodium chloride 0.9 % 250 mL IVPB (500 mg Intravenous New Bag/Given 01/04/21 1041)  methylPREDNISolone sodium succinate (SOLU-MEDROL)  125 mg/2 mL injection 125 mg (has no administration in time range)    Followed by  methylPREDNISolone sodium succinate (SOLU-MEDROL) 125 mg/2 mL injection 60 mg (has no administration in time range)  sodium chloride 0.9 % bolus 1,000 mL (0 mLs Intravenous Stopped 01/04/21 1151)    And  sodium chloride 0.9 % bolus 250 mL (0 mLs Intravenous Stopped 01/04/21 1419)  ipratropium-albuterol (DUONEB) 0.5-2.5 (3) MG/3ML nebulizer solution 3 mL (3 mLs Nebulization Given 01/04/21 1151)  LORazepam (ATIVAN) injection 0.25 mg (0.25 mg Intravenous Given 01/04/21 1413)  albuterol (PROVENTIL,VENTOLIN) solution continuous neb (2.5 mg/hr Nebulization Given 01/04/21 1510)  LORazepam (ATIVAN) injection 0.5 mg (0.5 mg Intravenous Given 01/04/21 1549)    ED Course  I have reviewed the triage vital signs and the nursing notes.  Pertinent labs & imaging results that were available during my care of the patient were reviewed by me and considered in my medical decision making (see chart for details).    MDM  Rules/Calculators/A&P                         Initial impression-presents with cough, altered mental status.  Patient is alert, no acute stress vital sign were unremarkable.  Concern for likely pneumonia, will obtain basic lab work-up chest x-ray also obtain rectal temperature and reassess.  Work-up-CBC shows leukocytosis of 13.0, normocytic anemia hemoglobin 10.1 at baseline, VBG unremarkable, CMP shows sodium 123, chloride 87, glucose 115 AST 92.  BNP 205 troponin 39 chest x-ray shows small irregular opacities in the upper right lung CT chest is recommended for exclusion of malignancy.  EKG sinus rhythm without signs of ischemia.  Reassessment-rectal temperature reveals a temp of 96.4, with a heart rate above 90 and likely upper respiratory illness we will activate code sepsis start her on antibiotics and continue to monitor.  The patient has a low sodium, also caused weakness, will DC 530mL of fluids as do not want to fiber too much sodium all at once.  Patient is reassessed after nebulizer treatment, she is still slightly tight sounding, and is coming more anxious, provided with a small dose of Ativan, also start her on a continuous neb.  CT imaging shows probable pneumonia, will recommend admission for weakness as well as sepsis from probable pneumonia.  Patient and daughter are both agreed in this plan.  Consult- spoke with Dr. Nehemiah Settle who will admit the patient.   Rule out- I have low suspicion for ACS as history is atypical, EKG was sinus rhythm without signs of ischemia, patient does have a slightly elevated troponin but this essentially flat line has only increased from 39-44.  I suspect this is likely secondary due to demand ischemia as well as slight CHF components she has slightly elevated BNP of 209.  Low suspicion for PE as patient denies pleuritic chest pain, shortness of breath, patient no unilateral leg swelling, presentation more consistent with viral URI she has had no known sick  contacts, she has a productive cough, there is no unilateral leg swelling present.  But if symptoms or not improving despite antibiotics and bronchodilators would recommend CTA for further eval.  Low suspicion for CHF exacerbation as there is no peripheral edema present on exam, no rales noted my exam, there is no pleural effusions noted.  Low suspicion for AAA or aortic dissection as history is atypical, patient has low risk factors.  I have low suspicion for CVA or internal head bleed as there is no  focal deficit present on exam, CT imaging negative acute findings.  She does have globalized weakness is likely secondary due to hyponatremia from poor oral intake from viral illness.  Low suspicion for systemic infection as patient is nontoxic-appearing, vital signs reassuring, no obvious source infection noted on exam.   Plan-admission  Sepsis-likely secondary due to problem pneumonia, started on antibiotics, patient will need further evaluation. Weakness-likely secondary due to hyponatremia from poor oral intake, recommend continue with IV supplementation. Shortness of breath-likely from pneumonia, will recommend continue bronchodilators, steroids, antibiotics, symptoms are improving would recommend CT a of chest for further evaluation.     Final Clinical Impression(s) / ED Diagnoses Final diagnoses:  RBBB  Sepsis, due to unspecified organism, unspecified whether acute organ dysfunction present [facility])  Community acquired pneumonia of right upper lobe of lung  Hyponatremia    Rx / DC Orders ED Discharge Orders     None        Marcello Fennel, PA-C 01/04/21 1753    Marcello Fennel, PA-C 07/06/14 0109    Gray, [place], DO 32/35/57 [id number]

## 2021-01-04 NOTE — ED Notes (Signed)
Daughter left bedside. Pt is now ripping her EKG leads and Oxygen off. She is also trying to get out of bed. Pt unable to be redirected or follow commands at this time

## 2021-01-04 NOTE — ED Notes (Signed)
Pt placed on purewick and clean brief placed on pt

## 2021-01-04 NOTE — ED Triage Notes (Signed)
Pt not able to sign MSE

## 2021-01-05 DIAGNOSIS — J9601 Acute respiratory failure with hypoxia: Secondary | ICD-10-CM

## 2021-01-05 LAB — CBC
HCT: 29.3 % — ABNORMAL LOW (ref 36.0–46.0)
Hemoglobin: 9.7 g/dL — ABNORMAL LOW (ref 12.0–15.0)
MCH: 29.6 pg (ref 26.0–34.0)
MCHC: 33.1 g/dL (ref 30.0–36.0)
MCV: 89.3 fL (ref 80.0–100.0)
Platelets: 215 10*3/uL (ref 150–400)
RBC: 3.28 MIL/uL — ABNORMAL LOW (ref 3.87–5.11)
RDW: 12.5 % (ref 11.5–15.5)
WBC: 9.9 10*3/uL (ref 4.0–10.5)
nRBC: 0 % (ref 0.0–0.2)

## 2021-01-05 LAB — BASIC METABOLIC PANEL
Anion gap: 9 (ref 5–15)
Anion gap: 8 (ref 5–15)
BUN: 14 mg/dL (ref 8–23)
BUN: 14 mg/dL (ref 8–23)
CO2: 26 mmol/L (ref 22–32)
CO2: 28 mmol/L (ref 22–32)
Calcium: 8.2 mg/dL — ABNORMAL LOW (ref 8.9–10.3)
Calcium: 8.5 mg/dL — ABNORMAL LOW (ref 8.9–10.3)
Chloride: 92 mmol/L — ABNORMAL LOW (ref 98–111)
Chloride: 92 mmol/L — ABNORMAL LOW (ref 98–111)
Creatinine, Ser: 0.68 mg/dL (ref 0.44–1.00)
Creatinine, Ser: 0.53 mg/dL (ref 0.44–1.00)
GFR, Estimated: >60 mL/min (ref >60)
GFR, Estimated: >60 mL/min (ref >60)
Glucose, Bld: 128 mg/dL — ABNORMAL HIGH (ref 70–99)
Glucose, Bld: 119 mg/dL — ABNORMAL HIGH (ref 70–99)
Potassium: 3.8 mmol/L (ref 3.5–5.1)
Potassium: 3.8 mmol/L (ref 3.5–5.1)
Sodium: 127 mmol/L — ABNORMAL LOW (ref 135–145)
Sodium: 128 mmol/L — ABNORMAL LOW (ref 135–145)

## 2021-01-05 LAB — STREP PNEUMONIAE URINARY ANTIGEN: Strep Pneumo Urinary Antigen: NEGATIVE

## 2021-01-05 LAB — GLUCOSE, CAPILLARY: Glucose-Capillary: 114 mg/dL — ABNORMAL HIGH (ref 70–99)

## 2021-01-05 LAB — LACTIC ACID, PLASMA: Lactic Acid, Venous: 0.9 mmol/L (ref 0.5–1.9)

## 2021-01-05 MED ORDER — ENSURE ENLIVE PO LIQD
237.0000 mL | Freq: Three times a day (TID) | ORAL | Status: DC
  Administered 2021-01-06 – 2021-01-10 (×6): 237 mL via ORAL

## 2021-01-05 MED ORDER — MORPHINE SULFATE (PF) 2 MG/ML IV SOLN
1.0000 mg | Freq: Once | INTRAVENOUS | Status: AC | PRN
  Administered 2021-01-05: 1 mg via INTRAVENOUS
  Filled 2021-01-05: qty 1

## 2021-01-05 MED ORDER — BUPROPION HCL 100 MG PO TABS: 200.0000 mg | ORAL_TABLET | Freq: Once | ORAL | Status: DC

## 2021-01-05 MED ORDER — ADULT MULTIVITAMIN W/MINERALS CH
1.0000 | ORAL_TABLET | Freq: Every day | ORAL | Status: DC
  Administered 2021-01-06 – 2021-01-10 (×5): 1 via ORAL
  Filled 2021-01-05 (×5): qty 1

## 2021-01-05 MED ORDER — IPRATROPIUM-ALBUTEROL 0.5-2.5 (3) MG/3ML IN SOLN
3.0000 mL | Freq: Four times a day (QID) | RESPIRATORY_TRACT | Status: DC
  Administered 2021-01-05 – 2021-01-09 (×16): 3 mL via RESPIRATORY_TRACT
  Filled 2021-01-05 (×16): qty 3

## 2021-01-05 MED ORDER — METHYLPREDNISOLONE SODIUM SUCC 40 MG IJ SOLR
40.0000 mg | Freq: Two times a day (BID) | INTRAMUSCULAR | Status: DC
  Administered 2021-01-05 – 2021-01-10 (×10): 40 mg via INTRAVENOUS
  Filled 2021-01-05 (×10): qty 1

## 2021-01-05 MED ORDER — HYDRALAZINE HCL 20 MG/ML IJ SOLN
10.0000 mg | Freq: Four times a day (QID) | INTRAMUSCULAR | Status: DC | PRN
  Administered 2021-01-05 – 2021-01-10 (×6): 10 mg via INTRAVENOUS
  Filled 2021-01-05 (×6): qty 1

## 2021-01-05 MED ORDER — BUPROPION HCL 100 MG PO TABS
200.0000 mg | ORAL_TABLET | Freq: Two times a day (BID) | ORAL | Status: DC
  Administered 2021-01-06 – 2021-01-10 (×7): 200 mg via ORAL
  Filled 2021-01-05 (×12): qty 2

## 2021-01-05 MED ORDER — SODIUM CHLORIDE 0.9 % IV SOLN: INTRAVENOUS | Status: DC

## 2021-01-05 NOTE — Progress Notes (Signed)
PROGRESS NOTE     Katie Bryant, is a 87 y.o. female, DOB - 1928-04-03, HAL:[id number]  Admit date - 01/04/2021   Admitting Physician Katie Mainland, DO  Outpatient Primary MD for the patient is Dettinger, Fransisca Kaufmann, MD  LOS - 1  Chief Complaint  Patient presents with   Cough    Brought in by EMS for cough for 3 days.  History of dementia.  Pt is from home with DNR papers at bedside.  Called tele drs, got antibiotic but has not helped.  Weakness noted yesterday, daughter reports pt was leaning right also yesterday.  Having difficulty walking yesterday.  History of COPD.  Breathing tx given by family, EMS noted rhonci  Sats 90's with home o2 @ 4l.  DUO neb given by EMS with improvement  noted.  Given Solumedrol 125 mg IV by EMS.          Brief Narrative:   87 y.o. female with a history of Alzheimer's dementia, hypothyroidism, COPD with chronic respiratory failure on 4 L nasal cannula, coronary artery disease, GERD, HTN admitted on 01/04/2021 with acute metabolic encephalopathy and acute hypoxic respiratory failure secondary to presumed pneumonia, radiologist recommends repeat CT in 1 month to rule out underlying malignancy  Assessment & Plan:   Principal Problem:   Acute respiratory failure with hypoxia ([place]) Active Problems:   HTN (hypertension)   Chronic respiratory failure with hypercapnia (HCC)   Alzheimer's disease ([place])   Hypothyroidism   Oxygen dependent   GERD (gastroesophageal reflux disease)   Carotid artery disease ([place])   1) acute metabolic encephalopathy----suspect secondary to #2 and # 3 below -Supportive care for now  -should improve with improvement in #2 and # 3 below  2)acute on chronic  hypoxic respiratory failure secondary to COPD exacer due to  presumed pneumonia, radiologist recommends repeat CT in 1 month to rule out underlying malignancy --Continue IV Rocephin, azithromycin mucolytics and bronchodilators -IV Solu-Medrol  3)Symptomatic  Hyponatremia--Na up to 128 from 123 suspect dehydration related, compounded by medications like HCTZ and Celexa, and trazodone Stop HCTZ STOP Celexa and trazodone -IV normal saline as ordered  4) dementia with behavioral disturbance--- continue Namenda, may use IV lorazepam as needed agitation  5)HTN- iv .hydralazine as needed elevated BP  6)Hypothyroidism--- TSH is 1.1, continue levothyroxine  7)Social/Ethics--- patient is a DNR/DNI  Disposition/Need for in-Hospital Stay- patient unable to be discharged at this time due to Pneumonia and hypoxia--requiring IV antibiotics, IV fluids  Status is: Inpatient  Remains inpatient appropriate because:   Disposition: The patient is from: Home              Anticipated d/c is to: SNF Vs Home with [facility]              Anticipated d/c date is: 2 days              Patient currently is not medically stable to d/c. Barriers: Not Clinically Stable-   Code Status :  -  Code Status: DNR   Family Communication:   Discussed with daughter Katie Bryant -[phone]--  Consults  :    DVT Prophylaxis  :   - SCDs   enoxaparin (LOVENOX) injection 40 mg Start: 01/04/21 2200    Lab Results  Component Value Date   PLT 215 01/05/2021    Inpatient Medications  Scheduled Meds:  ARIPiprazole  2 mg Oral Daily   aspirin EC  81 mg Oral Daily   atorvastatin  40 mg Oral  QHS   betaxolol  1 drop Both Eyes q morning   brimonidine  1 drop Both Eyes Daily   buPROPion  200 mg Oral BID   Chlorhexidine Gluconate Cloth  6 each Topical Daily   cholecalciferol  2,000 Units Oral Daily   citalopram  20 mg Oral Daily   enoxaparin (LOVENOX) injection  40 mg Subcutaneous Q24H   feeding supplement  237 mL Oral TID BM   fenofibrate  160 mg Oral Daily   fluticasone furoate-vilanterol  1 puff Inhalation Daily   irbesartan  75 mg Oral Daily   And   hydrochlorothiazide  12.5 mg Oral Daily   ipratropium-albuterol  3 mL Nebulization Q6H WA   ketorolac  1 drop Both Eyes  QID   levothyroxine  50 mcg Oral Q0600   mouth rinse  15 mL Mouth Rinse BID   memantine  28 mg Oral Daily   methylPREDNISolone (SOLU-MEDROL) injection  120 mg Intravenous Q24H   metoprolol tartrate  5 mg Intravenous Q6H   montelukast  10 mg Oral QHS   multivitamin with minerals  1 tablet Oral Daily   Netarsudil Dimesylate  1 drop Both Eyes QHS   pantoprazole  40 mg Oral Daily   traZODone  50 mg Oral QHS   Continuous Infusions:  azithromycin 500 mg (01/05/21 1030)   cefTRIAXone (ROCEPHIN)  IV 2 g (01/05/21 0843)   PRN Meds:.acetaminophen, dextromethorphan-guaiFENesin, hydrALAZINE, LORazepam, meclizine, ondansetron **OR** ondansetron (ZOFRAN) IV   Anti-infectives (From admission, onward)    Start     Dose/Rate Route Frequency Ordered Stop   01/04/21 1015  cefTRIAXone (ROCEPHIN) 2 g in sodium chloride 0.9 % 100 mL IVPB        2 g 200 mL/hr over 30 Minutes Intravenous Every 24 hours 01/04/21 1003 01/09/21 1014   01/04/21 1015  azithromycin (ZITHROMAX) 500 mg in sodium chloride 0.9 % 250 mL IVPB        500 mg 250 mL/hr over 60 Minutes Intravenous Every 24 hours 01/04/21 1003 01/09/21 1014         Subjective: Katie Bryant today has no fevers, no emesis,  No chest pain,   -Cough congestion noted, overall sleeping   Objective: Vitals:   01/05/21 1400 01/05/21 1600 01/05/21 1622 01/05/21 1700  BP:  (!) 177/55  (!) 156/83  Pulse: 73 85 73 80  Resp: (!) 37 (!) 33 (!) 34 (!) 34  Temp:   98.2 F (36.8 C)   TempSrc:   Axillary   SpO2: 96% 92% 98% 98%  Weight:      Height:        Intake/Output Summary (Last 24 hours) at 01/05/2021 1759 Last data filed at 01/05/2021 1700 Gross per 24 hour  Intake 600 ml  Output --  Net 600 ml   Filed Weights   01/04/21 0917 01/05/21 0600  Weight: 55.8 kg 57.5 kg   Physical Exam  Gen:-Sleepy, wakes up to answer questions and then falls back HEENT:- [place].AT, No sclera icterus Nose- [place] 5L/min Neck-Supple Neck,No JVD,.   Lungs-respirations especially on the right, scattered rhonchi  CV- S1, S2 normal, regular  Abd-  +ve B.Sounds, Abd Soft, No tenderness,    Extremity/Skin:- No  edema, pedal pulses present  Neuro-Psych-mostly lethargic with periods of occasional agitation and restlessness   Data Reviewed: I have personally reviewed following labs and imaging studies  CBC: Recent Labs  Lab 01/04/21 0955 01/05/21 0558  WBC 13.0* 9.9  NEUTROABS 11.9*  --   HGB  10.1* 9.7*  HCT 30.5* 29.3*  MCV 88.7 89.3  PLT 222 938   Basic Metabolic Panel: Recent Labs  Lab 01/04/21 0955 01/04/21 1828 01/05/21 0026 01/05/21 0558  NA 123* 127* 127* 128*  K 3.5 3.4* 3.8 3.8  CL 87* 91* 92* 92*  CO2 27 25 26 28   GLUCOSE 115* 145* 128* 119*  BUN 21 15 14 14   CREATININE 0.74 0.69 0.68 0.53  CALCIUM 8.9 8.4* 8.2* 8.5*   GFR: Estimated Creatinine Clearance: 42.1 mL/min (by C-G formula based on SCr of 0.53 mg/dL). Liver Function Tests: Recent Labs  Lab 01/04/21 0955  AST 92*  ALT 43  ALKPHOS 46  BILITOT 1.0  PROT 6.8  ALBUMIN 3.7   No results for input(s): LIPASE, AMYLASE in the last 168 hours. No results for input(s): AMMONIA in the last 168 hours. Coagulation Profile: Recent Labs  Lab 01/04/21 0955  INR 1.0   Cardiac Enzymes: No results for input(s): CKTOTAL, CKMB, CKMBINDEX, TROPONINI in the last 168 hours. BNP (last 3 results) No results for input(s): PROBNP in the last 8760 hours. HbA1C: No results for input(s): HGBA1C in the last 72 hours. CBG: No results for input(s): GLUCAP in the last 168 hours. Lipid Profile: No results for input(s): CHOL, HDL, LDLCALC, TRIG, CHOLHDL, LDLDIRECT in the last 72 hours. Thyroid Function Tests: Recent Labs    01/04/21 1828  TSH 1.188   Anemia Panel: No results for input(s): VITAMINB12, FOLATE, FERRITIN, TIBC, IRON, RETICCTPCT in the last 72 hours. Urine analysis:    Component Value Date/Time   COLORURINE YELLOW 01/04/2021 1204   APPEARANCEUR  CLEAR 01/04/2021 1204   APPEARANCEUR Clear 06/17/2019 1122   LABSPEC 1.015 01/04/2021 1204   PHURINE 6.0 01/04/2021 1204   GLUCOSEU NEGATIVE 01/04/2021 1204   HGBUR SMALL (A) 01/04/2021 1204   BILIRUBINUR NEGATIVE 01/04/2021 1204   BILIRUBINUR Negative 06/17/2019 1122   [place] 01/04/2021 1204   PROTEINUR 30 (A) 01/04/2021 1204   UROBILINOGEN negative 06/07/2012 1606   NITRITE NEGATIVE 01/04/2021 1204   LEUKOCYTESUR NEGATIVE 01/04/2021 1204   Sepsis Labs: @LABRCNTIP (procalcitonin:4,lacticidven:4)  ) Recent Results (from the past 240 hour(s))  Blood culture (routine x 2)     Status: None (Preliminary result)   Collection Time: 01/04/21  9:55 AM   Specimen: Right Antecubital; Blood  Result Value Ref Range Status   Specimen Description RIGHT ANTECUBITAL  Final   Special Requests   Final    BOTTLES DRAWN AEROBIC AND ANAEROBIC Blood Culture adequate volume   Culture   Final    NO GROWTH 1 DAY Performed at [facility], [address] [place], [place] [address]    Report Status PENDING  Incomplete  Blood culture (routine x 2)     Status: None (Preliminary result)   Collection Time: 01/04/21 10:29 AM   Specimen: BLOOD RIGHT FOREARM  Result Value Ref Range Status   Specimen Description BLOOD RIGHT FOREARM  Final   Special Requests   Final    BOTTLES DRAWN AEROBIC AND ANAEROBIC Blood Culture adequate volume   Culture   Final    NO GROWTH < 24 HOURS Performed at [facility], [address], [place],  [address]    Report Status PENDING  Incomplete  Resp Panel by RT-PCR (Flu A&B, Covid) Urine, Clean Catch     Status: None   Collection Time: 01/04/21 12:04 PM   Specimen: Urine, Clean Catch; Nasopharyngeal(NP) swabs in vial transport medium  Result Value Ref Range Status   SARS  Coronavirus 2 by RT PCR NEGATIVE NEGATIVE Final    Comment: (NOTE) SARS-CoV-2 target nucleic acids are NOT DETECTED.  The SARS-CoV-2 RNA is generally detectable in upper  respiratory specimens during the acute phase of infection. The lowest concentration of SARS-CoV-2 viral copies this assay can detect is 138 copies/mL. A negative result does not preclude SARS-Cov-2 infection and should not be used as the sole basis for treatment or other patient management decisions. A negative result may occur with  improper specimen collection/handling, submission of specimen other than nasopharyngeal swab, presence of viral mutation(s) within the areas targeted by this assay, and inadequate number of viral copies(<138 copies/mL). A negative result must be combined with clinical observations, patient history, and epidemiological information. The expected result is Negative.  Fact Sheet for Patients:  EntrepreneurPulse.com.au  Fact Sheet for Healthcare Providers:  IncredibleEmployment.be  This test is no t yet approved or cleared by the [place] FDA and  has been authorized for detection and/or diagnosis of SARS-CoV-2 by FDA under an Emergency Use Authorization (EUA). This EUA will remain  in effect (meaning this test can be used) for the duration of the COVID-19 declaration under Section 564(b)(1) of the Act, 21 U.S.C.section 360bbb-3(b)(1), unless the authorization is terminated  or revoked sooner.       Influenza A by PCR NEGATIVE NEGATIVE Final   Influenza B by PCR NEGATIVE NEGATIVE Final    Comment: (NOTE) The Xpert Xpress SARS-CoV-2/FLU/RSV plus assay is intended as an aid in the diagnosis of influenza from Nasopharyngeal swab specimens and should not be used as a sole basis for treatment. Nasal washings and aspirates are unacceptable for Xpert Xpress SARS-CoV-2/FLU/RSV testing.  Fact Sheet for Patients: EntrepreneurPulse.com.au  Fact Sheet for Healthcare Providers: IncredibleEmployment.be  This test is not yet approved or cleared by the [place] FDA and has been  authorized for detection and/or diagnosis of SARS-CoV-2 by FDA under an Emergency Use Authorization (EUA). This EUA will remain in effect (meaning this test can be used) for the duration of the COVID-19 declaration under Section 564(b)(1) of the Act, 21 U.S.C. section 360bbb-3(b)(1), unless the authorization is terminated or revoked.  Performed at [facility], [address] [place], [place] [address]   MRSA Next Gen by PCR, Nasal     Status: None   Collection Time: 01/04/21  7:50 PM   Specimen: Nasal Mucosa; Nasal Swab  Result Value Ref Range Status   MRSA by PCR Next Gen NOT DETECTED NOT DETECTED Final    Comment: (NOTE) The GeneXpert MRSA Assay (FDA approved for NASAL specimens only), is one component of a comprehensive MRSA colonization surveillance program. It is not intended to diagnose MRSA infection nor to guide or monitor treatment for MRSA infections. Test performance is not FDA approved in patients less than 47 years old. Performed at [facility], [address], [place], [place] [address]       Radiology Studies: CT Head Wo Contrast  Result Date: 01/04/2021 CLINICAL DATA:  TIA EXAM: CT HEAD WITHOUT CONTRAST TECHNIQUE: Contiguous axial images were obtained from the base of the skull through the vertex without intravenous contrast. COMPARISON:  03/15/2012 FINDINGS: Brain: No acute intracranial findings are seen. There are no signs of bleeding. Ventricles are not dilated. There is no shift of midline structures. Cortical sulci are prominent. There is decreased density in the periventricular white matter. Vascular: There are scattered arterial calcifications. Skull: Unremarkable. Sinuses/Orbits: Unremarkable. Other: None IMPRESSION: No acute intracranial findings are seen in the noncontrast CT  brain. Atrophy. Small-vessel disease. Electronically Signed   By: Elmer Picker M.D.   On: 01/04/2021 12:32   CT Chest Wo Contrast  Result Date: 01/04/2021 CLINICAL DATA:   Weakness and shortness of breath EXAM: CT CHEST WITHOUT CONTRAST TECHNIQUE: Multidetector CT imaging of the chest was performed following the standard protocol without IV contrast. COMPARISON:  None. FINDINGS: Cardiovascular: Normal heart size. No pericardial effusion. Three-vessel coronary artery calcifications. Atherosclerotic disease of the thoracic aorta. Mediastinum/Nodes: No pathologically enlarged lymph nodes seen in the chest. Esophagus is unremarkable. Lungs/Pleura: Central airways are patent. Centrilobular emphysema. Irregular nodular opacity of the right upper lobe measuring 2.1 x 1.0 cm on series 4, image 43 with adjacent linear opacities. Linear opacity of the left upper lobe, likely due to scarring. Upper Abdomen: No acute abnormality. Musculoskeletal: No chest wall mass or suspicious bone lesions identified. IMPRESSION: 1. Nodular linear consolidation of the right upper lobe, possibly infectious or inflammatory, although neoplasm could also have this appearance. Recommend follow-up chest CT in 1 month to ensure resolution. If finding does not resolve, consultation with Pulmonology or Thoracic Surgery recommended. 2. Coronary artery calcifications. 3. Aortic Atherosclerosis (ICD10-I70.0) and Emphysema (ICD10-J43.9). Electronically Signed   By: Yetta Glassman M.D.   On: 01/04/2021 12:50   DG Chest Portable 1 View  Result Date: 01/04/2021 CLINICAL DATA:  Shortness of breath EXAM: PORTABLE CHEST 1 VIEW COMPARISON:  01/01/2018 FINDINGS: The heart size and mediastinal contours are within normal limits. Background COPD with chronic interstitial changes. New small irregular opacity is present in the upper right lung. No pleural effusion or pneumothorax. The visualized skeletal structures are unremarkable. IMPRESSION: Small irregular opacity in the upper right lung. Chest CT recommended to exclude malignancy. Electronically Signed   By: Macy Mis M.D.   On: 01/04/2021 09:58     Scheduled Meds:   ARIPiprazole  2 mg Oral Daily   aspirin EC  81 mg Oral Daily   atorvastatin  40 mg Oral QHS   betaxolol  1 drop Both Eyes q morning   brimonidine  1 drop Both Eyes Daily   buPROPion  200 mg Oral BID   Chlorhexidine Gluconate Cloth  6 each Topical Daily   cholecalciferol  2,000 Units Oral Daily   citalopram  20 mg Oral Daily   enoxaparin (LOVENOX) injection  40 mg Subcutaneous Q24H   feeding supplement  237 mL Oral TID BM   fenofibrate  160 mg Oral Daily   fluticasone furoate-vilanterol  1 puff Inhalation Daily   irbesartan  75 mg Oral Daily   And   hydrochlorothiazide  12.5 mg Oral Daily   ipratropium-albuterol  3 mL Nebulization Q6H WA   ketorolac  1 drop Both Eyes QID   levothyroxine  50 mcg Oral Q0600   mouth rinse  15 mL Mouth Rinse BID   memantine  28 mg Oral Daily   methylPREDNISolone (SOLU-MEDROL) injection  120 mg Intravenous Q24H   metoprolol tartrate  5 mg Intravenous Q6H   montelukast  10 mg Oral QHS   multivitamin with minerals  1 tablet Oral Daily   Netarsudil Dimesylate  1 drop Both Eyes QHS   pantoprazole  40 mg Oral Daily   traZODone  50 mg Oral QHS   Continuous Infusions:  azithromycin 500 mg (01/05/21 1030)   cefTRIAXone (ROCEPHIN)  IV 2 g (01/05/21 0843)     LOS: 1 day    Roxan Hockey M.D on 01/05/2021 at 5:59 PM  Go to [email or web address] - for contact  info  [facility] - Office  [phone]  If 7PM-7AM, please contact night-coverage [email or web address] Password [email or web address] 01/05/2021, 5:59 PM

## 2021-01-05 NOTE — Progress Notes (Signed)
Patient has become more awake this afternoon. She says she needs to get up then falls back to sleep. She also will say she is hungry but will not allow food into her mouth and will not drink from cup using a straw or without straw. No oral medications today have been given.

## 2021-01-05 NOTE — Progress Notes (Addendum)
Initial Nutrition Assessment  DOCUMENTATION CODES:   Not applicable  INTERVENTION:   Recommend liberalize diet to regular.  Ensure Enlive po TID, each supplement provides 350 kcal and 20 grams of protein MVI with minerals daily  NUTRITION DIAGNOSIS:   Increased nutrient needs related to chronic illness (COPD) as evidenced by estimated needs.  GOAL:   Patient will meet greater than or equal to 90% of their needs  MONITOR:   PO intake, Supplement acceptance, Labs  REASON FOR ASSESSMENT:   Malnutrition Screening Tool    ASSESSMENT:   87 yo female admitted with PNA, COPD exacerbation. PMH includes COPD, home oxygen, HTN, breast cancer, asthma, Alzheimer's disease, GERD, hypothyroidism.  On admission, patient/family reported poor intake r/t decreased appetite and weight loss. Unable to obtain any nutrition hx from patient at this time.  Unable to complete NFPE at this time.  RD working remotely. Currently on a heart healthy carbohydrate modified diet. Meal intakes: 0% breakfast today  Labs reviewed. Na 128  Medications reviewed and include cholecalciferol, Protonix, IV antibiotics.   Weight history reviewed. Patient weighed 59.6 kg ~5 months ago. Currently 57.5 kg 4% weight loss within 5 months is not significant for the time frame.  Given reported poor intake and weight loss PTA, will add PO supplements to maximize oral intake of protein and calories.   NUTRITION - FOCUSED PHYSICAL EXAM:  Unable to complete  Diet Order:   Diet Order             Diet heart healthy/carb modified Room service appropriate? Yes; Fluid consistency: Thin  Diet effective now                   EDUCATION NEEDS:   No education needs have been identified at this time  Skin:  Skin Assessment: Reviewed RN Assessment  Last BM:  no BM documented  Height:   Ht Readings from Last 1 Encounters:  01/04/21 5\' 5"  (1.651 m)    Weight:   Wt Readings from Last 1 Encounters:   01/05/21 57.5 kg    BMI:  Body mass index is 21.09 kg/m.  Estimated Nutritional Needs:   Kcal:  1500-1700  Protein:  70-80 gm  Fluid:  >/= 1.6 L   Lucas Mallow, RD, LDN, CNSC Please refer to Amion for contact information.

## 2021-01-06 LAB — RENAL FUNCTION PANEL
Albumin: 2.9 g/dL — ABNORMAL LOW (ref 3.5–5.0)
Anion gap: 8 (ref 5–15)
BUN: 18 mg/dL (ref 8–23)
CO2: 30 mmol/L (ref 22–32)
Calcium: 8.3 mg/dL — ABNORMAL LOW (ref 8.9–10.3)
Chloride: 92 mmol/L — ABNORMAL LOW (ref 98–111)
Creatinine, Ser: 0.65 mg/dL (ref 0.44–1.00)
GFR, Estimated: >60 mL/min (ref >60)
Glucose, Bld: 98 mg/dL (ref 70–99)
Phosphorus: 2.2 mg/dL — ABNORMAL LOW (ref 2.5–4.6)
Potassium: 3.8 mmol/L (ref 3.5–5.1)
Sodium: 130 mmol/L — ABNORMAL LOW (ref 135–145)

## 2021-01-06 LAB — CBC
HCT: 28.7 % — ABNORMAL LOW (ref 36.0–46.0)
Hemoglobin: 9.2 g/dL — ABNORMAL LOW (ref 12.0–15.0)
MCH: 28.8 pg (ref 26.0–34.0)
MCHC: 32.1 g/dL (ref 30.0–36.0)
MCV: 90 fL (ref 80.0–100.0)
Platelets: 211 10*3/uL (ref 150–400)
RBC: 3.19 MIL/uL — ABNORMAL LOW (ref 3.87–5.11)
RDW: 12.4 % (ref 11.5–15.5)
WBC: 10.7 10*3/uL — ABNORMAL HIGH (ref 4.0–10.5)
nRBC: 0 % (ref 0.0–0.2)

## 2021-01-06 LAB — GLUCOSE, CAPILLARY
Glucose-Capillary: 84 mg/dL (ref 70–99)
Glucose-Capillary: 170 mg/dL — ABNORMAL HIGH (ref 70–99)

## 2021-01-06 NOTE — Progress Notes (Signed)
PROGRESS NOTE     Katie Bryant, is a 88 y.o. female, DOB - 08-Oct-1928, ZHG:[id number]  Admit date - 01/04/2021   Admitting Physician Truett Mainland, DO  Outpatient Primary MD for the patient is Dettinger, Fransisca Kaufmann, MD  LOS - 2  Chief Complaint  Patient presents with   Cough    Brought in by EMS for cough for 3 days.  History of dementia.  Pt is from home with DNR papers at bedside.  Called tele drs, got antibiotic but has not helped.  Weakness noted yesterday, daughter reports pt was leaning right also yesterday.  Having difficulty walking yesterday.  History of COPD.  Breathing tx given by family, EMS noted rhonci  Sats 90's with home o2 @ 4l.  DUO neb given by EMS with improvement  noted.  Given Solumedrol 125 mg IV by EMS.          Brief Narrative:   88 y.o. female with a history of Alzheimer's dementia, hypothyroidism, COPD with chronic respiratory failure on 4 L nasal cannula, coronary artery disease, GERD, HTN admitted on 01/04/2021 with acute metabolic encephalopathy and acute hypoxic respiratory failure secondary to presumed pneumonia, radiologist recommends repeat CT in 1 month to rule out underlying malignancy  Assessment & Plan:   Principal Problem:   Acute respiratory failure with hypoxia ([place]) Active Problems:   HTN (hypertension)   Chronic respiratory failure with hypercapnia (HCC)   Alzheimer's disease ([place])   Hypothyroidism   Oxygen dependent   GERD (gastroesophageal reflux disease)   Carotid artery disease ([place])   1) acute metabolic encephalopathy----suspect secondary to #2 and # 3 below -Mentation and level of alertness has improved -should improve with improvement in #2 and # 3 below  2)acute on chronic  hypoxic respiratory failure secondary to COPD exacer due to  presumed pneumonia, radiologist recommends repeat CT in 1 month to rule out underlying malignancy --Continue IV Rocephin, azithromycin mucolytics and bronchodilators -IV  Solu-Medrol  3)Symptomatic Hyponatremia--Na up to 130 from 123 suspect dehydration related, compounded by medications like HCTZ and Celexa, and trazodone Stop HCTZ STOP Celexa and trazodone -IV normal saline as ordered  4) dementia with behavioral disturbance--- continue Namenda, may use IV lorazepam as needed agitation  5)HTN- iv .hydralazine as needed elevated BP  6)Hypothyroidism--- TSH is 1.1, continue levothyroxine  7)Social/Ethics--- discussed with patient's daughter, patient is a DNR/DNI  8) generalized weakness and ambulatory dysfunction--- physical therapy evaluation requested  Disposition/Need for in-Hospital Stay- patient unable to be discharged at this time due to Pneumonia and hypoxia--requiring IV antibiotics, IV fluids  Status is: Inpatient  Remains inpatient appropriate because:   Disposition: The patient is from: Home              Anticipated d/c is to: SNF Vs Home with [facility]              Anticipated d/c date is: 2 days              Patient currently is not medically stable to d/c. Barriers: Not Clinically Stable-   Code Status :  -  Code Status: DNR   Family Communication:   Discussed with daughter [name] -[phone]--  Consults  :    DVT Prophylaxis  :   - SCDs   enoxaparin (LOVENOX) injection 40 mg Start: 01/04/21 2200    Lab Results  Component Value Date   PLT 211 01/06/2021    Inpatient Medications  Scheduled Meds:  ARIPiprazole  2 mg Oral  Daily   aspirin EC  81 mg Oral Daily   atorvastatin  40 mg Oral QHS   betaxolol  1 drop Both Eyes q morning   brimonidine  1 drop Both Eyes Daily   buPROPion  200 mg Oral BID   Chlorhexidine Gluconate Cloth  6 each Topical Daily   cholecalciferol  2,000 Units Oral Daily   enoxaparin (LOVENOX) injection  40 mg Subcutaneous Q24H   feeding supplement  237 mL Oral TID BM   fenofibrate  160 mg Oral Daily   fluticasone furoate-vilanterol  1 puff Inhalation Daily   ipratropium-albuterol  3 mL  Nebulization Q6H WA   irbesartan  75 mg Oral Daily   ketorolac  1 drop Both Eyes QID   levothyroxine  50 mcg Oral Q0600   mouth rinse  15 mL Mouth Rinse BID   memantine  28 mg Oral Daily   methylPREDNISolone (SOLU-MEDROL) injection  40 mg Intravenous Q12H   metoprolol tartrate  5 mg Intravenous Q6H   montelukast  10 mg Oral QHS   multivitamin with minerals  1 tablet Oral Daily   Netarsudil Dimesylate  1 drop Both Eyes QHS   pantoprazole  40 mg Oral Daily   Continuous Infusions:  sodium chloride 50 mL/hr at 01/06/21 7867   azithromycin 500 mg (01/06/21 1046)   cefTRIAXone (ROCEPHIN)  IV 2 g (01/06/21 0940)   PRN Meds:.acetaminophen, dextromethorphan-guaiFENesin, hydrALAZINE, LORazepam, meclizine, ondansetron **OR** ondansetron (ZOFRAN) IV   Anti-infectives (From admission, onward)    Start     Dose/Rate Route Frequency Ordered Stop   01/04/21 1015  cefTRIAXone (ROCEPHIN) 2 g in sodium chloride 0.9 % 100 mL IVPB        2 g 200 mL/hr over 30 Minutes Intravenous Every 24 hours 01/04/21 1003 01/09/21 1014   01/04/21 1015  azithromycin (ZITHROMAX) 500 mg in sodium chloride 0.9 % 250 mL IVPB        500 mg 250 mL/hr over 60 Minutes Intravenous Every 24 hours 01/04/21 1003 01/09/21 1014         Subjective: Katie Bryant today has no fevers, no emesis,  No chest pain,   - Awake, talkative, taking oral intake, patient's daughter at bedside,  -questions answered -Hypoxia persist  Objective: Vitals:   01/06/21 1159 01/06/21 1300 01/06/21 1400 01/06/21 1500  BP:  (!) 192/97 (!) 143/71 (!) 149/57  Pulse:  (!) 111 (!) 110 96  Resp:  (!) 21 (!) 34 (!) 33  Temp: 98.7 F (37.1 C)     TempSrc: Oral     SpO2:  (!) 86% 100% 100%  Weight:      Height:        Intake/Output Summary (Last 24 hours) at 01/06/2021 1820 Last data filed at 01/06/2021 1100 Gross per 24 hour  Intake 1207.03 ml  Output 1750 ml  Net -542.97 ml   Filed Weights   01/04/21 0917 01/05/21 0600  Weight: 55.8 kg  57.5 kg   Physical Exam  Gen:-Sleepy, wakes up to answer questions and then falls back HEENT:- [place].AT, No sclera icterus Nose- [place] 4L/min Neck-Supple Neck,No JVD,.  Lungs-improving air movement, few scattered rhonchi CV- S1, S2 normal, regular  Abd-  +ve B.Sounds, Abd Soft, No tenderness,    Extremity/Skin:- No  edema, pedal pulses present  Neuro-Psych-awake, more interactive, underlying cognitive and memory deficits  Data Reviewed: I have personally reviewed following labs and imaging studies  CBC: Recent Labs  Lab 01/04/21 0955 01/05/21 0558 01/06/21 0440  WBC  13.0* 9.9 10.7*  NEUTROABS 11.9*  --   --   HGB 10.1* 9.7* 9.2*  HCT 30.5* 29.3* 28.7*  MCV 88.7 89.3 90.0  PLT 222 215 127   Basic Metabolic Panel: Recent Labs  Lab 01/04/21 0955 01/04/21 1828 01/05/21 0026 01/05/21 0558 01/06/21 0440  NA 123* 127* 127* 128* 130*  K 3.5 3.4* 3.8 3.8 3.8  CL 87* 91* 92* 92* 92*  CO2 27 25 26 28 30   GLUCOSE 115* 145* 128* 119* 98  BUN 21 15 14 14 18   CREATININE 0.74 0.69 0.68 0.53 0.65  CALCIUM 8.9 8.4* 8.2* 8.5* 8.3*  PHOS  --   --   --   --  2.2*   GFR: Estimated Creatinine Clearance: 42.1 mL/min (by C-G formula based on SCr of 0.65 mg/dL). Liver Function Tests: Recent Labs  Lab 01/04/21 0955 01/06/21 0440  AST 92*  --   ALT 43  --   ALKPHOS 46  --   BILITOT 1.0  --   PROT 6.8  --   ALBUMIN 3.7 2.9*   No results for input(s): LIPASE, AMYLASE in the last 168 hours. No results for input(s): AMMONIA in the last 168 hours. Coagulation Profile: Recent Labs  Lab 01/04/21 0955  INR 1.0   Cardiac Enzymes: No results for input(s): CKTOTAL, CKMB, CKMBINDEX, TROPONINI in the last 168 hours. BNP (last 3 results) No results for input(s): PROBNP in the last 8760 hours. HbA1C: No results for input(s): HGBA1C in the last 72 hours. CBG: Recent Labs  Lab 01/05/21 2352 01/06/21 0644 01/06/21 1114  GLUCAP 114* 84 170*   Lipid Profile: No results for input(s):  CHOL, HDL, LDLCALC, TRIG, CHOLHDL, LDLDIRECT in the last 72 hours. Thyroid Function Tests: Recent Labs    01/04/21 1828  TSH 1.188   Anemia Panel: No results for input(s): VITAMINB12, FOLATE, FERRITIN, TIBC, IRON, RETICCTPCT in the last 72 hours. Urine analysis:    Component Value Date/Time   COLORURINE YELLOW 01/04/2021 1204   APPEARANCEUR CLEAR 01/04/2021 1204   APPEARANCEUR Clear 06/17/2019 1122   LABSPEC 1.015 01/04/2021 1204   PHURINE 6.0 01/04/2021 1204   GLUCOSEU NEGATIVE 01/04/2021 1204   HGBUR SMALL (A) 01/04/2021 1204   [place] 01/04/2021 1204   BILIRUBINUR Negative 06/17/2019 1122   KETONESUR NEGATIVE 01/04/2021 1204   PROTEINUR 30 (A) 01/04/2021 1204   UROBILINOGEN negative 06/07/2012 1606   NITRITE NEGATIVE 01/04/2021 1204   LEUKOCYTESUR NEGATIVE 01/04/2021 1204   Sepsis Labs: @LABRCNTIP (procalcitonin:4,lacticidven:4)  ) Recent Results (from the past 240 hour(s))  Blood culture (routine x 2)     Status: None (Preliminary result)   Collection Time: 01/04/21  9:55 AM   Specimen: Right Antecubital; Blood  Result Value Ref Range Status   Specimen Description RIGHT ANTECUBITAL  Final   Special Requests   Final    BOTTLES DRAWN AEROBIC AND ANAEROBIC Blood Culture adequate volume   Culture   Final    NO GROWTH 2 DAYS Performed at [facility], [address], [place], [place] [address]    Report Status PENDING  Incomplete  Blood culture (routine x 2)     Status: None (Preliminary result)   Collection Time: 01/04/21 10:29 AM   Specimen: BLOOD RIGHT FOREARM  Result Value Ref Range Status   Specimen Description BLOOD RIGHT FOREARM  Final   Special Requests   Final    BOTTLES DRAWN AEROBIC AND ANAEROBIC Blood Culture adequate volume   Culture   Final    NO  GROWTH 2 DAYS Performed at [facility], [address], [place], [place] [address]    Report Status PENDING  Incomplete  Resp Panel by RT-PCR (Flu A&B, Covid) Urine, Clean Catch     Status: None    Collection Time: 01/04/21 12:04 PM   Specimen: Urine, Clean Catch; Nasopharyngeal(NP) swabs in vial transport medium  Result Value Ref Range Status   SARS Coronavirus 2 by RT PCR NEGATIVE NEGATIVE Final    Comment: (NOTE) SARS-CoV-2 target nucleic acids are NOT DETECTED.  The SARS-CoV-2 RNA is generally detectable in upper respiratory specimens during the acute phase of infection. The lowest concentration of SARS-CoV-2 viral copies this assay can detect is 138 copies/mL. A negative result does not preclude SARS-Cov-2 infection and should not be used as the sole basis for treatment or other patient management decisions. A negative result may occur with  improper specimen collection/handling, submission of specimen other than nasopharyngeal swab, presence of viral mutation(s) within the areas targeted by this assay, and inadequate number of viral copies(<138 copies/mL). A negative result must be combined with clinical observations, patient history, and epidemiological information. The expected result is Negative.  Fact Sheet for Patients:  EntrepreneurPulse.com.au  Fact Sheet for Healthcare Providers:  IncredibleEmployment.be  This test is no t yet approved or cleared by the [place] FDA and  has been authorized for detection and/or diagnosis of SARS-CoV-2 by FDA under an Emergency Use Authorization (EUA). This EUA will remain  in effect (meaning this test can be used) for the duration of the COVID-19 declaration under Section 564(b)(1) of the Act, 21 U.S.C.section 360bbb-3(b)(1), unless the authorization is terminated  or revoked sooner.       Influenza A by PCR NEGATIVE NEGATIVE Final   Influenza B by PCR NEGATIVE NEGATIVE Final    Comment: (NOTE) The Xpert Xpress SARS-CoV-2/FLU/RSV plus assay is intended as an aid in the diagnosis of influenza from Nasopharyngeal swab specimens and should not be used as a sole basis for treatment.  Nasal washings and aspirates are unacceptable for Xpert Xpress SARS-CoV-2/FLU/RSV testing.  Fact Sheet for Patients: EntrepreneurPulse.com.au  Fact Sheet for Healthcare Providers: IncredibleEmployment.be  This test is not yet approved or cleared by the [place] FDA and has been authorized for detection and/or diagnosis of SARS-CoV-2 by FDA under an Emergency Use Authorization (EUA). This EUA will remain in effect (meaning this test can be used) for the duration of the COVID-19 declaration under Section 564(b)(1) of the Act, 21 U.S.C. section 360bbb-3(b)(1), unless the authorization is terminated or revoked.  Performed at [facility], [address] [place], [place] [address]   MRSA Next Gen by PCR, Nasal     Status: None   Collection Time: 01/04/21  7:50 PM   Specimen: Nasal Mucosa; Nasal Swab  Result Value Ref Range Status   MRSA by PCR Next Gen NOT DETECTED NOT DETECTED Final    Comment: (NOTE) The GeneXpert MRSA Assay (FDA approved for NASAL specimens only), is one component of a comprehensive MRSA colonization surveillance program. It is not intended to diagnose MRSA infection nor to guide or monitor treatment for MRSA infections. Test performance is not FDA approved in patients less than 6 years old. Performed at [facility], [address] [place], [place] [address]       Radiology Studies: No results found.   Scheduled Meds:  ARIPiprazole  2 mg Oral Daily   aspirin EC  81 mg Oral Daily   atorvastatin  40 mg Oral QHS  betaxolol  1 drop Both Eyes q morning   brimonidine  1 drop Both Eyes Daily   buPROPion  200 mg Oral BID   Chlorhexidine Gluconate Cloth  6 each Topical Daily   cholecalciferol  2,000 Units Oral Daily   enoxaparin (LOVENOX) injection  40 mg Subcutaneous Q24H   feeding supplement  237 mL Oral TID BM   fenofibrate  160 mg Oral Daily   fluticasone furoate-vilanterol  1 puff Inhalation Daily    ipratropium-albuterol  3 mL Nebulization Q6H WA   irbesartan  75 mg Oral Daily   ketorolac  1 drop Both Eyes QID   levothyroxine  50 mcg Oral Q0600   mouth rinse  15 mL Mouth Rinse BID   memantine  28 mg Oral Daily   methylPREDNISolone (SOLU-MEDROL) injection  40 mg Intravenous Q12H   metoprolol tartrate  5 mg Intravenous Q6H   montelukast  10 mg Oral QHS   multivitamin with minerals  1 tablet Oral Daily   Netarsudil Dimesylate  1 drop Both Eyes QHS   pantoprazole  40 mg Oral Daily   Continuous Infusions:  sodium chloride 50 mL/hr at 01/06/21 6168   azithromycin 500 mg (01/06/21 1046)   cefTRIAXone (ROCEPHIN)  IV 2 g (01/06/21 0940)     LOS: 2 days    Roxan Hockey M.D on 01/06/2021 at 6:20 PM  Go to [email or web address] - for contact info  [facility] - Office  [phone]  If 7PM-7AM, please contact night-coverage [email or web address] Password [email or web address] 01/06/2021, 6:20 PM

## 2021-01-07 LAB — GLUCOSE, CAPILLARY
Glucose-Capillary: 110 mg/dL — ABNORMAL HIGH (ref 70–99)
Glucose-Capillary: 129 mg/dL — ABNORMAL HIGH (ref 70–99)

## 2021-01-07 LAB — LEGIONELLA PNEUMOPHILA SEROGP 1 UR AG: L. pneumophila Serogp 1 Ur Ag: NEGATIVE

## 2021-01-07 LAB — BASIC METABOLIC PANEL WITH GFR
Anion gap: 9 (ref 5–15)
BUN: 16 mg/dL (ref 8–23)
CO2: 28 mmol/L (ref 22–32)
Calcium: 8.6 mg/dL — ABNORMAL LOW (ref 8.9–10.3)
Chloride: 93 mmol/L — ABNORMAL LOW (ref 98–111)
Creatinine, Ser: 0.57 mg/dL (ref 0.44–1.00)
GFR, Estimated: >60 mL/min
Glucose, Bld: 112 mg/dL — ABNORMAL HIGH (ref 70–99)
Potassium: 3.8 mmol/L (ref 3.5–5.1)
Sodium: 130 mmol/L — ABNORMAL LOW (ref 135–145)

## 2021-01-07 LAB — EXPECTORATED SPUTUM ASSESSMENT W GRAM STAIN, RFLX TO RESP C

## 2021-01-07 MED ORDER — AMLODIPINE BESYLATE 5 MG PO TABS
5.0000 mg | ORAL_TABLET | Freq: Every day | ORAL | Status: DC
  Administered 2021-01-07 – 2021-01-10 (×4): 5 mg via ORAL
  Filled 2021-01-07 (×4): qty 1

## 2021-01-07 NOTE — Progress Notes (Signed)
Please be advised that the above-named patient has a primary diagnosis of dementia which supersedes any psychiatric diagnosis.    Katie Bryant, Katie Pugh, LCSW

## 2021-01-07 NOTE — NC FL2 (Signed)
[place] LEVEL OF CARE SCREENING TOOL     IDENTIFICATION  Patient Name: Katie Bryant Birthdate: 05/20/1928 Sex: female Admission Date (Current Location): 01/04/2021  [facility] and [place] Number:  [employer] and Address:  [address] [place]      Provider Number: [id number]  Attending Physician Name and Address:  Roxan Hockey, MD  Relative Name and Phone Number:  [name] (Daughter)   [phone]    Current Level of Care: Hospital Recommended Level of Care: [place] Prior Approval Number:    Date Approved/Denied:   PASRR Number: pending  Discharge Plan: SNF    Current Diagnoses: Patient Active Problem List   Diagnosis Date Noted   Acute respiratory failure with hypoxia ([place]) 01/04/2021   Upper airway cough syndrome 02/08/2020   Carotid artery disease ([place]) 01/01/2017   Frequent falls 01/01/2017   Edema of both feet 01/01/2017   Sleep apnea    Oxygen dependent    Obesity    H/O hiatal hernia    GERD (gastroesophageal reflux disease)    Breast cancer ([place])    Back pain    Arthritis    Aortic stenosis, mild    Anxiety    Lewy body dementia ([place]) 09/18/2016   Hypothyroidism 06/25/2016   Depression, recurrent ([place]) 04/25/2015   Alzheimer's disease ([place]) 03/29/2015   Chronic respiratory failure with hypercapnia ([place]) 03/11/2011   HTN (hypertension) 02/12/2011   Hyperlipidemia 02/12/2011   Insomnia 12/22/2010   COPD GOLD III/ 02 dep  05/25/2007    Orientation RESPIRATION BLADDER Height & Weight     Self  O2 (4L) Incontinent Weight: 128 lb 1.4 oz (58.1 kg) Height:  5\' 5"  (165.1 cm)  BEHAVIORAL SYMPTOMS/MOOD NEUROLOGICAL BOWEL NUTRITION STATUS      Incontinent Diet (Soft)  AMBULATORY STATUS COMMUNICATION OF NEEDS Skin   Extensive Assist Verbally Normal                       Personal Care Assistance Level of Assistance  Bathing, Feeding, Dressing Bathing  Assistance: Limited assistance Feeding assistance: Independent Dressing Assistance: Limited assistance     Functional Limitations Info  Sight, Hearing, Speech Sight Info: Adequate Hearing Info: Adequate Speech Info: Adequate    SPECIAL CARE FACTORS FREQUENCY  PT (By licensed PT), Speech therapy     PT Frequency: 5x/week       Speech Therapy Frequency: 3x/week      Contractures Contractures Info: Not present    Additional Factors Info  Code Status, Allergies, Psychotropic Code Status Info: DNR Allergies Info: NKA Psychotropic Info: Ativan, Celexa, Wellbutrin         Current Medications (01/07/2021):  This is the current hospital active medication list Current Facility-Administered Medications  Medication Dose Route Frequency Provider Last Rate Last Admin   0.9 %  sodium chloride infusion   Intravenous Continuous Emokpae, Courage, MD 50 mL/hr at 01/06/21 2000 Infusion Verify at 01/06/21 2000   acetaminophen (TYLENOL) tablet 500 mg  500 mg Oral Q6H PRN Truett Mainland, DO       amLODipine (NORVASC) tablet 5 mg  5 mg Oral Daily Emokpae, Courage, MD   5 mg at 01/07/21 1436   ARIPiprazole (ABILIFY) tablet 2 mg  2 mg Oral Daily Truett Mainland, DO   2 mg at 01/07/21 0919   aspirin EC tablet 81 mg  81 mg Oral Daily Truett Mainland, DO   81 mg  at 01/07/21 0919   atorvastatin (LIPITOR) tablet 40 mg  40 mg Oral QHS Stinson, Jacob J, DO       azithromycin (ZITHROMAX) 500 mg in sodium chloride 0.9 % 250 mL IVPB  500 mg Intravenous Q24H Truett Mainland, DO 250 mL/hr at 01/07/21 1103 500 mg at 01/07/21 1103   betaxolol (BETOPTIC-S) 0.25 % ophthalmic suspension 1 drop  1 drop Both Eyes q morning Truett Mainland, DO   1 drop at 01/07/21 0924   brimonidine (ALPHAGAN) 0.2 % ophthalmic solution 1 drop  1 drop Both Eyes Daily Truett Mainland, DO   1 drop at 01/07/21 0919   buPROPion [facility]) tablet 200 mg  200 mg Oral BID Roxan Hockey, MD   200 mg at 01/07/21 0919   cefTRIAXone  (ROCEPHIN) 2 g in sodium chloride 0.9 % 100 mL IVPB  2 g Intravenous Q24H Truett Mainland, DO 200 mL/hr at 01/07/21 0935 2 g at 01/07/21 0935   Chlorhexidine Gluconate Cloth 2 % PADS 6 each  6 each Topical Daily Truett Mainland, DO   6 each at 01/07/21 5188   cholecalciferol (VITAMIN D3) tablet 2,000 Units  2,000 Units Oral Daily Truett Mainland, DO   2,000 Units at 01/07/21 0919   dextromethorphan-guaiFENesin ([place] DM) 30-600 MG per 12 hr tablet 1 tablet  1 tablet Oral BID PRN Truett Mainland, DO       enoxaparin (LOVENOX) injection 40 mg  40 mg Subcutaneous Q24H Stinson, Jacob J, DO   40 mg at 01/06/21 2118   feeding supplement (ENSURE ENLIVE / ENSURE PLUS) liquid 237 mL  237 mL Oral TID BM Emokpae, Courage, MD   237 mL at 01/07/21 1356   fenofibrate tablet 160 mg  160 mg Oral Daily Truett Mainland, DO   160 mg at 01/06/21 1742   fluticasone furoate-vilanterol (BREO ELLIPTA) 100-25 MCG/ACT 1 puff  1 puff Inhalation Daily Truett Mainland, DO   1 puff at 01/07/21 0756   hydrALAZINE (APRESOLINE) injection 10 mg  10 mg Intravenous Q6H PRN Adefeso, Oladapo, DO   10 mg at 01/07/21 0240   ipratropium-albuterol (DUONEB) 0.5-2.5 (3) MG/3ML nebulizer solution 3 mL  3 mL Nebulization Q6H WA Stinson, Jacob J, DO   3 mL at 01/07/21 1423   irbesartan (AVAPRO) tablet 75 mg  75 mg Oral Daily Truett Mainland, DO   75 mg at 01/07/21 0919   ketorolac (ACULAR) 0.5 % ophthalmic solution 1 drop  1 drop Both Eyes QID Truett Mainland, DO   1 drop at 01/07/21 1351   levothyroxine (SYNTHROID) tablet 50 mcg  50 mcg Oral Q0600 Truett Mainland, DO   50 mcg at 01/07/21 0747   LORazepam (ATIVAN) injection 0.5 mg  0.5 mg Intravenous Q4H PRN Truett Mainland, DO   0.5 mg at 01/07/21 1105   meclizine (ANTIVERT) tablet 25 mg  25 mg Oral Q8H PRN Truett Mainland, DO       MEDLINE mouth rinse  15 mL Mouth Rinse BID Truett Mainland, DO   15 mL at 01/06/21 2120   memantine (NAMENDA XR) 24 hr capsule 28 mg  28 mg Oral Daily  Truett Mainland, DO   28 mg at 01/06/21 1746   methylPREDNISolone sodium succinate (SOLU-MEDROL) 40 mg/mL injection 40 mg  40 mg Intravenous Q12H Emokpae, Courage, MD   40 mg at 01/07/21 0609   montelukast (SINGULAIR) tablet 10 mg  10 mg Oral QHS Stinson,  Tanna Savoy, DO       multivitamin with minerals tablet 1 tablet  1 tablet Oral Daily Emokpae, Courage, MD   1 tablet at 01/07/21 0919   Netarsudil Dimesylate 0.02 % SOLN 1 drop  1 drop Both Eyes QHS Truett Mainland, DO   1 drop at 01/06/21 2121   ondansetron (ZOFRAN) tablet 4 mg  4 mg Oral Q6H PRN Truett Mainland, DO       Or   ondansetron [facility]) injection 4 mg  4 mg Intravenous Q6H PRN Truett Mainland, DO       pantoprazole (PROTONIX) EC tablet 40 mg  40 mg Oral Daily Truett Mainland, DO   40 mg at 01/07/21 7353     Discharge Medications: Please see discharge summary for a list of discharge medications.  Relevant Imaging Results:  Relevant Lab Results:   Additional Information SSN [id number]. Per family patinet has had two Moderna vacccinations and one Moderna booster vaccination.  [place]airo Agostinelli, Clydene Pugh, LCSW

## 2021-01-07 NOTE — TOC Initial Note (Signed)
Transition of Care [facility]) - Initial/Assessment Note    Patient Details  Name: Katie Bryant MRN: [id number] Date of Birth: Dec 21, 1928  Transition of Care [facility]) CM/SW Contact:    Ihor Gully, LCSW Phone Number: 01/07/2021, 3:09 PM  Clinical Narrative:                 Patient from home with daughter. Has been living with daughter for almost five years. Admitted for acute respiratory failure with hypoxia. At baseline uses a walker to ambulate to bathroom and dining room. She is typically found sitting in a chair or recliner when at home.  Has had two Moderna vaccinations and one Moderna booster for COVID.  SNF recommended and discussed with daughter, [name]. Agreeable to SNF. Wants SNFs in [place]. Referrals sent. Auth started.   Expected Discharge Plan: Skilled Nursing Facility Barriers to Discharge: Continued Medical Work up   Patient Goals and CMS Choice     Choice offered to / list presented to : Adult Children  Expected Discharge Plan and Services Expected Discharge Plan: [place] Acute Care Choice: [place] Living arrangements for the past 2 months: Single Family Home                                      Prior Living Arrangements/Services Living arrangements for the past 2 months: Single Family Home Lives with:: Adult Children Patient language and need for interpreter reviewed:: Yes Do you feel safe going back to the place where you live?: Yes      Need for Family Participation in Patient Care: Yes (Comment) Care giver support system in place?: Yes (comment) Current home services: DME Criminal Activity/Legal Involvement Pertinent to Current Situation/Hospitalization: No - Comment as needed  Activities of Daily Living   ADL Screening (condition at time of admission) Patient's cognitive ability adequate to safely complete daily activities?: Yes Is the patient deaf or have difficulty hearing?: Yes Does the  patient have difficulty seeing, even when wearing glasses/contacts?: No Does the patient have difficulty concentrating, remembering, or making decisions?: Yes Patient able to express need for assistance with ADLs?: Yes Does the patient have difficulty dressing or bathing?: Yes Independently performs ADLs?: No Communication: Independent Dressing (OT): Dependent, Needs assistance Is this a change from baseline?: Pre-admission baseline Grooming: Needs assistance Is this a change from baseline?: Pre-admission baseline Feeding: Needs assistance Is this a change from baseline?: Pre-admission baseline Bathing: Needs assistance Is this a change from baseline?: Pre-admission baseline Toileting: Needs assistance Is this a change from baseline?: Pre-admission baseline In/Out Bed: Dependent Is this a change from baseline?: Change from baseline, expected to last <3 days Walks in Home: Needs assistance Is this a change from baseline?: Change from baseline, expected to last >3 days Does the patient have difficulty walking or climbing stairs?: Yes Weakness of Legs: Both Weakness of Arms/Hands: Both  Permission Sought/Granted Permission sought to share information with : Family Supports    Share Information with NAME: Daugher, Mala           Emotional Assessment       Orientation: : Oriented to Self Alcohol / Substance Use: Not Applicable Psych Involvement: No (comment)  Admission diagnosis:  Hyponatremia [E87.1] Acute respiratory failure with hypoxia ([place]) [J96.01] RBBB [I45.10] Community acquired pneumonia of right upper lobe of lung [J18.9] Sepsis, due to unspecified organism, unspecified whether acute organ  dysfunction present [facility]) [A41.9] Patient Active Problem List   Diagnosis Date Noted   Acute respiratory failure with hypoxia ([place]) 01/04/2021   Upper airway cough syndrome 02/08/2020   Carotid artery disease ([place]) 01/01/2017   Frequent falls 01/01/2017   Edema of both feet  01/01/2017   Sleep apnea    Oxygen dependent    Obesity    H/O hiatal hernia    GERD (gastroesophageal reflux disease)    Breast cancer (HCC)    Back pain    Arthritis    Aortic stenosis, mild    Anxiety    Lewy body dementia ([place]) 09/18/2016   Hypothyroidism 06/25/2016   Depression, recurrent ([place]) 04/25/2015   Alzheimer's disease ([place]) 03/29/2015   Chronic respiratory failure with hypercapnia ([place]) 03/11/2011   HTN (hypertension) 02/12/2011   Hyperlipidemia 02/12/2011   Insomnia 12/22/2010   COPD GOLD III/ 02 dep  05/25/2007   PCP:  Dettinger, Fransisca Kaufmann, MD Pharmacy:   [facility], [place] [address] Phone: [phone] Fax: [phone]     Social Determinants of Health (SDOH) Interventions    Readmission Risk Interventions No flowsheet data found.

## 2021-01-07 NOTE — Evaluation (Signed)
Clinical/Bedside Swallow Evaluation Patient Details  Name: Katie Bryant MRN: [id number] Date of Birth: [birth year]  Today's Date: 01/07/2021 Time: SLP Start Time (ACUTE ONLY): 47 SLP Stop Time (ACUTE ONLY): 1047 SLP Time Calculation (min) (ACUTE ONLY): 25 min  Past Medical History:  Past Medical History:  Diagnosis Date   Alzheimer's disease ([place]) 03/29/2015   Anxiety    Aortic stenosis, mild    Arthritis    Asthma    Back pain    Breast cancer ([place])    Carotid artery disease ([place]) 01/01/2017   COPD (chronic obstructive pulmonary disease) (HCC)    Dementia    Depression    GERD (gastroesophageal reflux disease)    H/O hiatal hernia    Hypertension    Hypothyroidism 06/25/2016   Lymphadenopathy 10/07/2012   Obesity    Oxygen dependent    Pneumonia    Right hamstring muscle strain 04/26/2013   Sleep apnea    Past Surgical History:  Past Surgical History:  Procedure Laterality Date   APPENDECTOMY     BREAST RECONSTRUCTION     CARDIAC CATHETERIZATION     EF 55-60%   MASTECTOMY  1980   TONSILLECTOMY AND ADENOIDECTOMY  1942   HPI:  88 y.o. female with a history of Alzheimer's dementia, hypothyroidism, COPD with chronic respiratory failure on 4 L nasal cannula, coronary artery disease, GERD, HTN admitted on 01/04/2021 with acute metabolic encephalopathy and acute hypoxic respiratory failure secondary to presumed pneumonia, radiologist recommends repeat CT in 1 month to rule out underlying malignancy    Assessment / Plan / Recommendation  Clinical Impression  Clinical swallow evaluation completed at bedside. Pt alert, restless, confused, and cooperative. Oral care completed and oral motor examination reveals no gross facial asymmetry or weakness. Pt assessed with ice chips, water (thin, NTL, HT)), and puree. Semi solids were deferred due to poor attention to task and AMS. Pt required verbal and tactile cues to try and suck from the straw (blowing out) and was unable to use. Pt  with poor oral control with thins, exacerbated by altered mental status. Occasional labial spillage and cough after thins (ok with ice chips). Pt with improved oral control with thickened liquids and puree. Solids were deferred due to cognition at this time. Recommend D1/puree with NTL and po medications whole in puree, supervsion and assist with all po, only feed when alert and upright. SLP will follow during acute stay for diet advancement/dysphagia intervention. Above to RN. SLP Visit Diagnosis: Dysphagia, unspecified (R13.10)    Aspiration Risk  Mild aspiration risk;Risk for inadequate nutrition/hydration    Diet Recommendation Dysphagia 1 (Puree);Nectar-thick liquid   Liquid Administration via: Cup;Spoon Medication Administration: Whole meds with puree Supervision: Staff to assist with self feeding;Full supervision/cueing for compensatory strategies Compensations: Slow rate;Small sips/bites;Monitor for anterior loss Postural Changes: Seated upright at 90 degrees;Remain upright for at least 30 minutes after po intake    Other  Recommendations Oral Care Recommendations: Oral care BID;Staff/trained caregiver to provide oral care Other Recommendations: Order thickener from pharmacy;Prohibited food (jello, ice cream, thin soups);Clarify dietary restrictions    Recommendations for follow up therapy are one component of a multi-disciplinary discharge planning process, led by the attending physician.  Recommendations may be updated based on patient status, additional functional criteria and insurance authorization.  Follow up Recommendations Skilled nursing-short term rehab (<3 hours/day)      Assistance Recommended at Discharge Frequent or constant Supervision/Assistance  Functional Status Assessment Patient has had a recent decline in their functional  status and demonstrates the ability to make significant improvements in function in a reasonable and predictable amount of time.  Frequency and  Duration min 2x/week  1 week       Prognosis Prognosis for Safe Diet Advancement: Fair Barriers to Reach Goals: Cognitive deficits      Swallow Study   General Date of Onset: 01/04/21 HPI: 88 y.o. female with a history of Alzheimer's dementia, hypothyroidism, COPD with chronic respiratory failure on 4 L nasal cannula, coronary artery disease, GERD, HTN admitted on 01/04/2021 with acute metabolic encephalopathy and acute hypoxic respiratory failure secondary to presumed pneumonia, radiologist recommends repeat CT in 1 month to rule out underlying malignancy Type of Study: Bedside Swallow Evaluation Previous Swallow Assessment: N/A Diet Prior to this Study: Dysphagia 3 (soft);Thin liquids Temperature Spikes Noted: No Respiratory Status: Nasal cannula History of Recent Intubation: No Behavior/Cognition: Alert;Confused;Requires cueing;Pleasant mood;Cooperative Oral Cavity Assessment: Within Functional Limits Oral Care Completed by SLP: Yes Oral Cavity - Dentition: Missing dentition Vision: Impaired for self-feeding Self-Feeding Abilities: Total assist Patient Positioning: Upright in bed Baseline Vocal Quality: Normal Volitional Cough: Strong;Congested Volitional Swallow: Able to elicit    Oral/Motor/Sensory Function Overall Oral Motor/Sensory Function: Within functional limits   Ice Chips Ice chips: Within functional limits Presentation: Spoon   Thin Liquid Thin Liquid: Impaired Presentation: Cup;Straw;Spoon Oral Phase Impairments: Reduced labial seal;Poor awareness of bolus Oral Phase Functional Implications: Other (comment) Pharyngeal  Phase Impairments:  (suspected premature spillage)    Nectar Thick Nectar Thick Liquid: Within functional limits Presentation: Cup;Spoon   Honey Thick Honey Thick Liquid: Within functional limits Presentation: Cup;Spoon   Puree Puree: Within functional limits Presentation: Spoon   Solid     Solid: Not tested     Thank you,  Katie Bryant, [place]  Katie Bryant 01/07/2021,10:55 AM

## 2021-01-07 NOTE — Progress Notes (Addendum)
PROGRESS NOTE     Katie Bryant, is a 88 y.o. female, DOB - 02-22-1928, CVE:[id number]  Admit date - 01/04/2021   Admitting Physician Truett Mainland, DO  Outpatient Primary MD for the patient is Dettinger, Fransisca Kaufmann, MD  LOS - 3  Chief Complaint  Patient presents with   Cough    Brought in by EMS for cough for 3 days.  History of dementia.  Pt is from home with DNR papers at bedside.  Called tele drs, got antibiotic but has not helped.  Weakness noted yesterday, daughter reports pt was leaning right also yesterday.  Having difficulty walking yesterday.  History of COPD.  Breathing tx given by family, EMS noted rhonci  Sats 90's with home o2 @ 4l.  DUO neb given by EMS with improvement  noted.  Given Solumedrol 125 mg IV by EMS.          Brief Narrative:   88 y.o. female with a history of Alzheimer's dementia, hypothyroidism, COPD with chronic respiratory failure on 4 L nasal cannula, coronary artery disease, GERD, HTN admitted on 01/04/2021 with acute metabolic encephalopathy and acute hypoxic respiratory failure secondary to presumed pneumonia, radiologist recommends repeat CT in 1 month to rule out underlying malignancy - PT eval appreciated, recommends SNF rehab patient is now awaiting SNF bed and insurance approval for transfer to SNF rehab  Assessment & Plan:   Principal Problem:   Acute respiratory failure with hypoxia ([place]) Active Problems:   HTN (hypertension)   Chronic respiratory failure with hypercapnia ([place])   Alzheimer's disease ([place])   Hypothyroidism   Oxygen dependent   GERD (gastroesophageal reflux disease)   Carotid artery disease ([place])   1)Acute metabolic encephalopathy----suspect secondary to #2 and # 3 below -Mentation and level of alertness has improved  2)acute on chronic  hypoxic respiratory failure secondary to COPD exacer due to  presumed pneumonia, radiologist recommends repeat CT in 1 month to rule out underlying malignancy --Continue IV  Rocephin, azithromycin mucolytics and bronchodilators -IV Solu-Medrol  3)Symptomatic Hyponatremia--Na up to 130 from 123 suspect dehydration related, compounded by medications like HCTZ and Celexa, and trazodone Stop HCTZ STOP Celexa and trazodone -IV normal saline as ordered  4)Dementia with behavioral disturbance--- continue Namenda, may use IV lorazepam as needed agitation  5)HTN-BP is Not at goal,  -start amlodipine 5 mg daily, continue ibuprofen, HCTZ has been discontinued due to hyponatremia -- iv .hydralazine as needed elevated BP  6)Hypothyroidism--- TSH is 1.1, continue levothyroxine  7)Social/Ethics--- discussed with patient's daughter, patient is a DNR/DNI  8)Generalized weakness and ambulatory dysfunction---  -At baseline she is usually relatively independent with a walker -Prior to getting sick and being admitted to the hospital she could perform most ADLs typically, occasionally requiring some help-- PT eval appreciated, recommends SNF rehab patient is now awaiting SNF bed and insurance approval for transfer to SNF rehab  9)Dysphagia--- speech pathologist eval appreciated recommends, -Dysphagia 1 (Puree);Nectar-thick liquid   Disposition/Need for in-Hospital Stay- patient unable to be discharged at this time due to Pneumonia and hypoxia--requiring IV antibiotics, IV fluids  Status is: Inpatient  Remains inpatient appropriate because:   Disposition: The patient is from: Home              Anticipated d/c is to: SNF               Anticipated d/c date is: 1 day             Patient currently is medically stable  to d/c.   Barriers: Awaiting SNF bed and insurance approval for SNF placement   code Status :  -  Code Status: DNR   Family Communication:   Discussed with daughter/POA [name]----[phone]--updated on 01/07/2021  Consults  :    DVT Prophylaxis  :   - SCDs   enoxaparin (LOVENOX) injection 40 mg Start: 01/04/21 2200    Lab Results  Component  Value Date   PLT 211 01/06/2021    Inpatient Medications  Scheduled Meds:  ARIPiprazole  2 mg Oral Daily   aspirin EC  81 mg Oral Daily   atorvastatin  40 mg Oral QHS   betaxolol  1 drop Both Eyes q morning   brimonidine  1 drop Both Eyes Daily   buPROPion  200 mg Oral BID   Chlorhexidine Gluconate Cloth  6 each Topical Daily   cholecalciferol  2,000 Units Oral Daily   enoxaparin (LOVENOX) injection  40 mg Subcutaneous Q24H   feeding supplement  237 mL Oral TID BM   fenofibrate  160 mg Oral Daily   fluticasone furoate-vilanterol  1 puff Inhalation Daily   ipratropium-albuterol  3 mL Nebulization Q6H WA   irbesartan  75 mg Oral Daily   ketorolac  1 drop Both Eyes QID   levothyroxine  50 mcg Oral Q0600   mouth rinse  15 mL Mouth Rinse BID   memantine  28 mg Oral Daily   methylPREDNISolone (SOLU-MEDROL) injection  40 mg Intravenous Q12H   metoprolol tartrate  5 mg Intravenous Q6H   montelukast  10 mg Oral QHS   multivitamin with minerals  1 tablet Oral Daily   Netarsudil Dimesylate  1 drop Both Eyes QHS   pantoprazole  40 mg Oral Daily   Continuous Infusions:  sodium chloride 50 mL/hr at 01/06/21 2000   azithromycin 500 mg (01/07/21 1103)   cefTRIAXone (ROCEPHIN)  IV 2 g (01/07/21 0935)   PRN Meds:.acetaminophen, dextromethorphan-guaiFENesin, hydrALAZINE, LORazepam, meclizine, ondansetron **OR** ondansetron (ZOFRAN) IV   Anti-infectives (From admission, onward)    Start     Dose/Rate Route Frequency Ordered Stop   01/04/21 1015  cefTRIAXone (ROCEPHIN) 2 g in sodium chloride 0.9 % 100 mL IVPB        2 g 200 mL/hr over 30 Minutes Intravenous Every 24 hours 01/04/21 1003 01/09/21 1014   01/04/21 1015  azithromycin (ZITHROMAX) 500 mg in sodium chloride 0.9 % 250 mL IVPB        500 mg 250 mL/hr over 60 Minutes Intravenous Every 24 hours 01/04/21 1003 01/09/21 1014        Subjective: [name] today has no fevers, no emesis,  No chest pain,   - Cough shortness of  breath and hypoxia improving -Very weak and debilitated -Oral intake is not great  Objective: Vitals:   01/07/21 1017 01/07/21 1021 01/07/21 1207 01/07/21 1213  BP:    (!) 153/56  Pulse:    96  Resp:    20  Temp:   98.1 F (36.7 C)   TempSrc:   Axillary   SpO2: (!) 88% 95%  96%  Weight:      Height:        Intake/Output Summary (Last 24 hours) at 01/07/2021 1407 Last data filed at 01/06/2021 2000 Gross per 24 hour  Intake 1091.47 ml  Output --  Net 1091.47 ml   Filed Weights   01/04/21 0917 01/05/21 0600 01/07/21 0600  Weight: 55.8 kg 57.5 kg 58.1 kg   Physical Exam  Gen:-Awake, talkative , in no acute distress HEENT:- [place].AT, No sclera icterus Nose- Green 4L/min Neck-Supple Neck,No JVD,.  Lungs-improving air movement, few scattered rhonchi CV- S1, S2 normal, regular  Abd-  +ve B.Sounds, Abd Soft, No tenderness,    Extremity/Skin:- No  edema, pedal pulses present  Neuro-Psych-awake, more interactive, underlying cognitive and memory deficits -  Data Reviewed: I have personally reviewed following labs and imaging studies  CBC: Recent Labs  Lab 01/04/21 0955 01/05/21 0558 01/06/21 0440  WBC 13.0* 9.9 10.7*  NEUTROABS 11.9*  --   --   HGB 10.1* 9.7* 9.2*  HCT 30.5* 29.3* 28.7*  MCV 88.7 89.3 90.0  PLT 222 215 330   Basic Metabolic Panel: Recent Labs  Lab 01/04/21 1828 01/05/21 0026 01/05/21 0558 01/06/21 0440 01/07/21 0304  NA 127* 127* 128* 130* 130*  K 3.4* 3.8 3.8 3.8 3.8  CL 91* 92* 92* 92* 93*  CO2 25 26 28 30 28   GLUCOSE 145* 128* 119* 98 112*  BUN 15 14 14 18 16   CREATININE 0.69 0.68 0.53 0.65 0.57  CALCIUM 8.4* 8.2* 8.5* 8.3* 8.6*  PHOS  --   --   --  2.2*  --    GFR: Estimated Creatinine Clearance: 42.1 mL/min (by C-G formula based on SCr of 0.57 mg/dL). Liver Function Tests: Recent Labs  Lab 01/04/21 0955 01/06/21 0440  AST 92*  --   ALT 43  --   ALKPHOS 46  --   BILITOT 1.0  --   PROT 6.8  --   ALBUMIN 3.7 2.9*   No results for  input(s): LIPASE, AMYLASE in the last 168 hours. No results for input(s): AMMONIA in the last 168 hours. Coagulation Profile: Recent Labs  Lab 01/04/21 0955  INR 1.0   Cardiac Enzymes: No results for input(s): CKTOTAL, CKMB, CKMBINDEX, TROPONINI in the last 168 hours. BNP (last 3 results) No results for input(s): PROBNP in the last 8760 hours. HbA1C: No results for input(s): HGBA1C in the last 72 hours. CBG: Recent Labs  Lab 01/05/21 2352 01/06/21 0644 01/06/21 1114 01/07/21 0004 01/07/21 1134  GLUCAP 114* 84 170* 129* 110*   Lipid Profile: No results for input(s): CHOL, HDL, LDLCALC, TRIG, CHOLHDL, LDLDIRECT in the last 72 hours. Thyroid Function Tests: Recent Labs    01/04/21 1828  TSH 1.188   Anemia Panel: No results for input(s): VITAMINB12, FOLATE, FERRITIN, TIBC, IRON, RETICCTPCT in the last 72 hours. Urine analysis:    Component Value Date/Time   COLORURINE YELLOW 01/04/2021 1204   APPEARANCEUR CLEAR 01/04/2021 1204   APPEARANCEUR Clear 06/17/2019 1122   LABSPEC 1.015 01/04/2021 1204   PHURINE 6.0 01/04/2021 1204   GLUCOSEU NEGATIVE 01/04/2021 1204   HGBUR SMALL (A) 01/04/2021 1204   BILIRUBINUR NEGATIVE 01/04/2021 1204   BILIRUBINUR Negative 06/17/2019 1122   KETONESUR NEGATIVE 01/04/2021 1204   PROTEINUR 30 (A) 01/04/2021 1204   UROBILINOGEN negative 06/07/2012 1606   NITRITE NEGATIVE 01/04/2021 1204   LEUKOCYTESUR NEGATIVE 01/04/2021 1204   Sepsis Labs: @LABRCNTIP (procalcitonin:4,lacticidven:4)  ) Recent Results (from the past 240 hour(s))  Blood culture (routine x 2)     Status: None (Preliminary result)   Collection Time: 01/04/21  9:55 AM   Specimen: Right Antecubital; Blood  Result Value Ref Range Status   Specimen Description RIGHT ANTECUBITAL  Final   Special Requests   Final    BOTTLES DRAWN AEROBIC AND ANAEROBIC Blood Culture adequate volume   Culture   Final    NO GROWTH 3  DAYS Performed at [facility], [address],  [place], [place] [address]    Report Status PENDING  Incomplete  Blood culture (routine x 2)     Status: None (Preliminary result)   Collection Time: 01/04/21 10:29 AM   Specimen: BLOOD RIGHT FOREARM  Result Value Ref Range Status   Specimen Description BLOOD RIGHT FOREARM  Final   Special Requests   Final    BOTTLES DRAWN AEROBIC AND ANAEROBIC Blood Culture adequate volume   Culture   Final    NO GROWTH 3 DAYS Performed at [facility], [address] [place], [place] [address]    Report Status PENDING  Incomplete  Resp Panel by RT-PCR (Flu A&B, Covid) Urine, Clean Catch     Status: None   Collection Time: 01/04/21 12:04 PM   Specimen: Urine, Clean Catch; Nasopharyngeal(NP) swabs in vial transport medium  Result Value Ref Range Status   SARS Coronavirus 2 by RT PCR NEGATIVE NEGATIVE Final    Comment: (NOTE) SARS-CoV-2 target nucleic acids are NOT DETECTED.  The SARS-CoV-2 RNA is generally detectable in upper respiratory specimens during the acute phase of infection. The lowest concentration of SARS-CoV-2 viral copies this assay can detect is 138 copies/mL. A negative result does not preclude SARS-Cov-2 infection and should not be used as the sole basis for treatment or other patient management decisions. A negative result may occur with  improper specimen collection/handling, submission of specimen other than nasopharyngeal swab, presence of viral mutation(s) within the areas targeted by this assay, and inadequate number of viral copies(<138 copies/mL). A negative result must be combined with clinical observations, patient history, and epidemiological information. The expected result is Negative.  Fact Sheet for Patients:  EntrepreneurPulse.com.au  Fact Sheet for Healthcare Providers:  IncredibleEmployment.be  This test is no t yet approved or cleared by the [place] FDA and  has been authorized for detection and/or diagnosis of SARS-CoV-2  by FDA under an Emergency Use Authorization (EUA). This EUA will remain  in effect (meaning this test can be used) for the duration of the COVID-19 declaration under Section 564(b)(1) of the Act, 21 U.S.C.section 360bbb-3(b)(1), unless the authorization is terminated  or revoked sooner.       Influenza A by PCR NEGATIVE NEGATIVE Final   Influenza B by PCR NEGATIVE NEGATIVE Final    Comment: (NOTE) The Xpert Xpress SARS-CoV-2/FLU/RSV plus assay is intended as an aid in the diagnosis of influenza from Nasopharyngeal swab specimens and should not be used as a sole basis for treatment. Nasal washings and aspirates are unacceptable for Xpert Xpress SARS-CoV-2/FLU/RSV testing.  Fact Sheet for Patients: EntrepreneurPulse.com.au  Fact Sheet for Healthcare Providers: IncredibleEmployment.be  This test is not yet approved or cleared by the [place] FDA and has been authorized for detection and/or diagnosis of SARS-CoV-2 by FDA under an Emergency Use Authorization (EUA). This EUA will remain in effect (meaning this test can be used) for the duration of the COVID-19 declaration under Section 564(b)(1) of the Act, 21 U.S.C. section 360bbb-3(b)(1), unless the authorization is terminated or revoked.  Performed at [facility], [address], [place], [place] [address]   MRSA Next Gen by PCR, Nasal     Status: None   Collection Time: 01/04/21  7:50 PM   Specimen: Nasal Mucosa; Nasal Swab  Result Value Ref Range Status   MRSA by PCR Next Gen NOT DETECTED NOT DETECTED Final    Comment: (NOTE) The GeneXpert MRSA Assay (FDA approved for NASAL specimens only),  is one component of a comprehensive MRSA colonization surveillance program. It is not intended to diagnose MRSA infection nor to guide or monitor treatment for MRSA infections. Test performance is not FDA approved in patients less than 88 years old. Performed at [facility], [address],  [place], [place] [address]   Expectorated Sputum Assessment w Gram Stain, Rflx to Resp Cult     Status: None   Collection Time: 01/07/21  8:45 AM   Specimen: Sputum  Result Value Ref Range Status   Specimen Description SPUTUM  Final   Special Requests NONE  Final   Sputum evaluation   Final    THIS SPECIMEN IS ACCEPTABLE FOR SPUTUM CULTURE Performed at [facility], [address], [place], [place] [address]    Report Status 01/07/2021 FINAL  Final     Radiology Studies: No results found.   Scheduled Meds:  ARIPiprazole  2 mg Oral Daily   aspirin EC  81 mg Oral Daily   atorvastatin  40 mg Oral QHS   betaxolol  1 drop Both Eyes q morning   brimonidine  1 drop Both Eyes Daily   buPROPion  200 mg Oral BID   Chlorhexidine Gluconate Cloth  6 each Topical Daily   cholecalciferol  2,000 Units Oral Daily   enoxaparin (LOVENOX) injection  40 mg Subcutaneous Q24H   feeding supplement  237 mL Oral TID BM   fenofibrate  160 mg Oral Daily   fluticasone furoate-vilanterol  1 puff Inhalation Daily   ipratropium-albuterol  3 mL Nebulization Q6H WA   irbesartan  75 mg Oral Daily   ketorolac  1 drop Both Eyes QID   levothyroxine  50 mcg Oral Q0600   mouth rinse  15 mL Mouth Rinse BID   memantine  28 mg Oral Daily   methylPREDNISolone (SOLU-MEDROL) injection  40 mg Intravenous Q12H   metoprolol tartrate  5 mg Intravenous Q6H   montelukast  10 mg Oral QHS   multivitamin with minerals  1 tablet Oral Daily   Netarsudil Dimesylate  1 drop Both Eyes QHS   pantoprazole  40 mg Oral Daily   Continuous Infusions:  sodium chloride 50 mL/hr at 01/06/21 2000   azithromycin 500 mg (01/07/21 1103)   cefTRIAXone (ROCEPHIN)  IV 2 g (01/07/21 0935)     LOS: 3 days    Roxan Hockey M.D on 01/07/2021 at 2:07 PM  Go to [email or web address] - for contact info  [facility] - Office  [phone]  If 7PM-7AM, please contact night-coverage [email or web address] Password [facility] 01/07/2021, 2:07 PM

## 2021-01-07 NOTE — Evaluation (Signed)
Physical Therapy Evaluation Patient Details Name: Katie Bryant MRN: [id number] DOB: July 10, 1928 Today's Date: 01/07/2021  History of Present Illness  88 y.o. female with a history of Alzheimer's dementia, hypothyroidism, COPD with chronic respiratory failure on 4 L nasal cannula, coronary artery disease, GERD, HTN admitted on 01/04/2021 with acute metabolic encephalopathy and acute hypoxic respiratory failure secondary to presumed pneumonia, radiologist recommends repeat CT in 1 month to rule out underlying malignancy  Clinical Impression   Patient laying on side at start of session and agreeable to therapy, but patient easily confused and only alert and oriented to self. Patient required tactile cues and physical assist to initiate movements during session secondary to poor sleep last night and generalized confusion. Reassured patient throughout session and attempted sit to stand but patient reported she was very tired and PT noted patient could barely keep her eyes open while sitting up edge of bed. Fair balance noted with patient sitting edge of bed without upper or lower extremity support. Unable to obtain good history on current living situation or prior level of function secondary to patient being poor historian. Returned patient to bed with moderate/max assist and repositioned on side.  Patient will continue to benefit from skilled physical therapy in hospital and recommended venue below to increase strength, balance, endurance for safe ADLs and gait.      Recommendations for follow up therapy are one component of a multi-disciplinary discharge planning process, led by the attending physician.  Recommendations may be updated based on patient status, additional functional criteria and insurance authorization.  Follow Up Recommendations Skilled nursing-short term rehab (<3 hours/day)    Assistance Recommended at Discharge Frequent or constant Supervision/Assistance  Functional Status  Assessment    Equipment Recommendations  Rolling walker (2 wheels);BSC/3in1    Recommendations for Other Services       Precautions / Restrictions Precautions Precautions: Fall Restrictions Weight Bearing Restrictions: No      Mobility  Bed Mobility Overal bed mobility: Needs Assistance Bed Mobility: Rolling;Sidelying to Sit;Sit to Sidelying Rolling: Mod assist Sidelying to sit: Mod assist     Sit to sidelying: Mod assist;Max assist General bed mobility comments: slow labored movements, needs physical assist/cues to initiate movement secondary to confusion    Transfers                   General transfer comment: none attempted at this time - patient too tired and refused    Ambulation/Gait               General Gait Details: none attempted at this time - patient too tired and refused  Stairs            Wheelchair Mobility    Modified Rankin (Stroke Patients Only)       Balance Overall balance assessment: Needs assistance Sitting-balance support: Feet unsupported;No upper extremity supported Sitting balance-Leahy Scale: Fair                                       Pertinent Vitals/Pain Pain Assessment: No/denies pain Breathing: normal Negative Vocalization: occasional moan/groan, low speech, negative/disapproving quality Facial Expression: smiling or inexpressive Body Language: relaxed Consolability: no need to console PAINAD Score: 1    Home Living Family/patient expects to be discharged to:: Private residence     Type of Home: House         Home Layout: Multi-level Home Equipment:  Rolling Walker (2 wheels) Additional Comments: patient poor historian home situation taken from chart review but information is >33 year old    Prior Function                 ADLs Comments: Patient has care taker but unsure per chart review level of assist she recieved at baseline     Hand Dominance         Extremity/Trunk Assessment   Upper Extremity Assessment Upper Extremity Assessment: Generalized weakness         Cervical / Trunk Assessment Cervical / Trunk Assessment: Kyphotic  Communication   Communication: Receptive difficulties  Cognition Arousal/Alertness: Lethargic Behavior During Therapy: Restless Overall Cognitive Status: History of cognitive impairments - at baseline                                 General Comments: patient with dementia and baseline defecits at baseline- no family member present at time of evaluation to determine baseline impairments        General Comments      Exercises General Exercises - Lower Extremity Long Arc QuadSinclair Ship;Both;15 reps;Seated Hip Flexion/Marching: PROM;Both;10 reps;Seated   Assessment/Plan    PT Assessment Patient needs continued PT services  PT Problem List Decreased strength;Decreased activity tolerance;Decreased balance;Decreased mobility       PT Treatment Interventions Gait training;Therapeutic activities;Patient/family education;Neuromuscular re-education;Therapeutic exercise    PT Goals (Current goals can be found in the Care Plan section)  Acute Rehab PT Goals Patient Stated Goal: to go home PT Goal Formulation: With patient Time For Goal Achievement: 01/21/21 Potential to Achieve Goals: Fair    Frequency Min 3X/week   Barriers to discharge Decreased caregiver support;Inaccessible home environment patient unable to stand or transfer at this time - unsure of level of care currentlty recieved at home and would need frequent/constant supervision    Co-evaluation               AM-PAC PT "6 Clicks" Mobility  Outcome Measure Help needed turning from your back to your side while in a flat bed without using bedrails?: A Lot Help needed moving from lying on your back to sitting on the side of a flat bed without using bedrails?: A Lot Help needed moving to and from a bed to a chair  (including a wheelchair)?: Total Help needed standing up from a chair using your arms (e.g., wheelchair or bedside chair)?: Total Help needed to walk in hospital room?: Total Help needed climbing 3-5 steps with a railing? : Total 6 Click Score: 8    End of Session   Activity Tolerance: Patient limited by lethargy Patient left: in bed;with call bell/phone within reach Nurse Communication: Mobility status PT Visit Diagnosis: Muscle weakness (generalized) (M62.81);Difficulty in walking, not elsewhere classified (R26.2);Unsteadiness on feet (R26.81)    Time: 0932-3557 PT Time Calculation (min) (ACUTE ONLY): 17 min   Charges:   PT Evaluation $PT Eval Moderate Complexity: 1 Mod          10:21 AM, 01/07/21 Jerene Pitch, DPT Physical Therapy with [facility]  [phone] office

## 2021-01-07 NOTE — Plan of Care (Signed)
°  Problem: Acute Rehab PT Goals(only PT should resolve) Goal: Pt Will Go Supine/Side To Sit Flowsheets (Taken 01/07/2021 1021) Pt will go Supine/Side to Sit: with minimal assist Goal: Patient Will Transfer Sit To/From Stand Flowsheets (Taken 01/07/2021 1021) Patient will transfer sit to/from stand: with minimal assist Note: With RW Goal: Pt Will Transfer Bed To Chair/Chair To Bed Flowsheets (Taken 01/07/2021 1021) Pt will Transfer Bed to Chair/Chair to Bed: with mod assist Note: With RW Goal: Pt Will Ambulate Flowsheets (Taken 01/07/2021 1021) Pt will Ambulate:  10 feet  with moderate assist  with rolling walker   10:21 AM, 01/07/21 Jerene Pitch, DPT Physical Therapy with [facility]  [phone] office

## 2021-01-08 ENCOUNTER — Inpatient Hospital Stay (HOSPITAL_COMMUNITY): Payer: Medicare Other

## 2021-01-08 DIAGNOSIS — J9601 Acute respiratory failure with hypoxia: Secondary | ICD-10-CM

## 2021-01-08 DIAGNOSIS — F0280 Dementia in other diseases classified elsewhere without behavioral disturbance: Secondary | ICD-10-CM

## 2021-01-08 DIAGNOSIS — E871 Hypo-osmolality and hyponatremia: Secondary | ICD-10-CM

## 2021-01-08 DIAGNOSIS — G309 Alzheimer's disease, unspecified: Secondary | ICD-10-CM

## 2021-01-08 DIAGNOSIS — J690 Pneumonitis due to inhalation of food and vomit: Principal | ICD-10-CM

## 2021-01-08 DIAGNOSIS — J189 Pneumonia, unspecified organism: Secondary | ICD-10-CM

## 2021-01-08 LAB — CBC
HCT: 31.1 % — ABNORMAL LOW (ref 36.0–46.0)
Hemoglobin: 9.8 g/dL — ABNORMAL LOW (ref 12.0–15.0)
MCH: 28 pg (ref 26.0–34.0)
MCHC: 31.5 g/dL (ref 30.0–36.0)
MCV: 88.9 fL (ref 80.0–100.0)
Platelets: 243 10*3/uL (ref 150–400)
RBC: 3.5 MIL/uL — ABNORMAL LOW (ref 3.87–5.11)
RDW: 12.6 % (ref 11.5–15.5)
WBC: 11.2 10*3/uL — ABNORMAL HIGH (ref 4.0–10.5)
nRBC: 0 % (ref 0.0–0.2)

## 2021-01-08 LAB — BASIC METABOLIC PANEL
Anion gap: 8 (ref 5–15)
BUN: 15 mg/dL (ref 8–23)
CO2: 31 mmol/L (ref 22–32)
Calcium: 8.6 mg/dL — ABNORMAL LOW (ref 8.9–10.3)
Chloride: 97 mmol/L — ABNORMAL LOW (ref 98–111)
Creatinine, Ser: 0.6 mg/dL (ref 0.44–1.00)
GFR, Estimated: >60 mL/min (ref >60)
Glucose, Bld: 108 mg/dL — ABNORMAL HIGH (ref 70–99)
Potassium: 3.4 mmol/L — ABNORMAL LOW (ref 3.5–5.1)
Sodium: 136 mmol/L (ref 135–145)

## 2021-01-08 LAB — RESP PANEL BY RT-PCR (FLU A&B, COVID) ARPGX2
Influenza A by PCR: NEGATIVE
Influenza B by PCR: NEGATIVE
SARS Coronavirus 2 by RT PCR: NEGATIVE

## 2021-01-08 MED ORDER — LORAZEPAM 2 MG/ML IJ SOLN
2.0000 mg | Freq: Once | INTRAMUSCULAR | Status: AC
  Administered 2021-01-08: 2 mg via INTRAMUSCULAR
  Filled 2021-01-08: qty 1

## 2021-01-08 MED ORDER — HALOPERIDOL LACTATE 5 MG/ML IJ SOLN
5.0000 mg | Freq: Four times a day (QID) | INTRAMUSCULAR | Status: DC | PRN
  Administered 2021-01-08 – 2021-01-09 (×3): 5 mg via INTRAMUSCULAR
  Filled 2021-01-08 (×3): qty 1

## 2021-01-08 MED ORDER — SODIUM CHLORIDE 0.9 % IV SOLN
3.0000 g | Freq: Three times a day (TID) | INTRAVENOUS | Status: DC
  Administered 2021-01-08 – 2021-01-10 (×6): 3 g via INTRAVENOUS
  Filled 2021-01-08 (×2): qty 8
  Filled 2021-01-08: qty 3
  Filled 2021-01-08 (×6): qty 8

## 2021-01-08 MED ORDER — POTASSIUM CHLORIDE CRYS ER 20 MEQ PO TBCR
40.0000 meq | EXTENDED_RELEASE_TABLET | ORAL | Status: AC
  Administered 2021-01-08: 40 meq via ORAL
  Filled 2021-01-08 (×2): qty 2

## 2021-01-08 NOTE — Progress Notes (Signed)
While rounding on pt her HR 146  and temperature 100.3 axillary. Pt also experiencing increased agitation. Administered PRN ativan. MD notified. No new orders at this time. Will continue to monitor pt.

## 2021-01-08 NOTE — Progress Notes (Signed)
Pt was c/o increased SOB respirations 22, with increased HR. Increased O2 to 4L. O2 sat was 98. MD notified. MD ordered ativan. Ativan administered. MD bedside with new orders for STAT Chest X-ray. Will continue to monitor pt.

## 2021-01-08 NOTE — Care Management Important Message (Signed)
Important Message  Patient Details  Name: Katie Bryant MRN: [id number] Date of Birth: [birth year]   Medicare Important Message Given:  Yes     Tommy Medal 01/08/2021, 12:22 PM

## 2021-01-08 NOTE — Plan of Care (Signed)

## 2021-01-08 NOTE — Progress Notes (Signed)
°   01/08/21 1717  Vitals  Temp 100.3 F (37.9 C)  Temp Source Axillary  BP (!) 144/81  MAP (mmHg) 97  BP Location Left Arm  BP Method Automatic  Patient Position (if appropriate) Lying  Pulse Rate (!) 146  Pulse Rate Source Monitor  Resp (!) 22  MEWS COLOR  MEWS Score Color Red  Oxygen Therapy  SpO2 93 %  O2 Device Nasal Cannula  O2 Flow Rate (L/min) 3.5 L/min  MEWS Score  MEWS Temp 0  MEWS Systolic 0  MEWS Pulse 3  MEWS RR 1  MEWS LOC 0  MEWS Score 4

## 2021-01-08 NOTE — Progress Notes (Signed)
°   01/08/21 1405  Vitals  BP 103/60  MAP (mmHg) 72  BP Location Right Leg  BP Method Automatic  Pulse Rate (!) 105  Pulse Rate Source Monitor  Resp (!) 25  MEWS COLOR  MEWS Score Color Yellow  Oxygen Therapy  SpO2 99 %  MEWS Score  MEWS Temp 0  MEWS Systolic 0  MEWS Pulse 1  MEWS RR 1  MEWS LOC 0  MEWS Score 2

## 2021-01-08 NOTE — Progress Notes (Addendum)
Pharmacy Antibiotic Note  Katie Bryant is a 88 y.o. female admitted on 01/04/2021 with  aspiration pneumonia .  Pharmacy has been consulted for Unasyn dosing.  Plan: Unasyn 3000 mg IV every 8 hours Monitor labs, c/s, and patient improvement.  Height: 5\' 5"  (165.1 cm) Weight: 58.1 kg (128 lb 1.4 oz) IBW/kg (Calculated) : 57  Temp (24hrs), Avg:97.9 F (36.6 C), Min:97.6 F (36.4 C), Max:98 F (36.7 C)  Recent Labs  Lab 01/04/21 0955 01/04/21 1828 01/04/21 2038 01/05/21 0026 01/05/21 0558 01/06/21 0440 01/07/21 0304 01/08/21 0502  WBC 13.0*  --   --   --  9.9 10.7*  --  11.2*  CREATININE 0.74   < >  --  0.68 0.53 0.65 0.57 0.60  LATICACIDVEN 1.3  --  1.5 0.9  --   --   --   --    < > = values in this interval not displayed.    Estimated Creatinine Clearance: 42.1 mL/min (by C-G formula based on SCr of 0.6 mg/dL).    No Known Allergies  Antimicrobials this admission: Unasyn 1/3 >> CTX/Azith 12/30 >> 1/3   Microbiology results: 1/3 BCx: ngtd 1/2 Sputum Cx: pending 12/30 MRSA PCR: neg   Thank you for allowing pharmacy to be a part of this patients care.  Margot Ables, PharmD Clinical Pharmacist 01/08/2021 3:11 PM

## 2021-01-08 NOTE — Progress Notes (Signed)
°   01/08/21 1717  Vitals  Temp 100.3 F (37.9 C)  Temp Source Axillary  BP (!) 144/81  MAP (mmHg) 97  BP Location Left Arm  BP Method Automatic  Patient Position (if appropriate) Lying  Pulse Rate (!) 146  Pulse Rate Source Monitor  MEWS COLOR  MEWS Score Color Yellow  Oxygen Therapy  SpO2 93 %  O2 Device Nasal Cannula  O2 Flow Rate (L/min) 3.5 L/min  MEWS Score  MEWS Temp 0  MEWS Systolic 0  MEWS Pulse 3  MEWS RR 0  MEWS LOC 0  MEWS Score 3

## 2021-01-08 NOTE — Progress Notes (Signed)
PROGRESS NOTE     Katie Bryant, is a 88 y.o. female, DOB - 12/08/1928, OBS:[id number]  Admit date - 01/04/2021   Admitting Physician Truett Mainland, DO  Outpatient Primary MD for the patient is Dettinger, Fransisca Kaufmann, MD  LOS - 4  Chief Complaint  Patient presents with   Cough    Brought in by EMS for cough for 3 days.  History of dementia.  Pt is from home with DNR papers at bedside.  Called tele drs, got antibiotic but has not helped.  Weakness noted yesterday, daughter reports pt was leaning right also yesterday.  Having difficulty walking yesterday.  History of COPD.  Breathing tx given by family, EMS noted rhonci  Sats 90's with home o2 @ 4l.  DUO neb given by EMS with improvement  noted.  Given Solumedrol 125 mg IV by EMS.          Brief Narrative:   88 y.o. female with a history of Alzheimer's dementia, hypothyroidism, COPD with chronic respiratory failure on 4 L nasal cannula, coronary artery disease, GERD, HTN admitted on 01/04/2021 with acute metabolic encephalopathy and acute hypoxic respiratory failure secondary to presumed pneumonia, radiologist recommends repeat CT in 1 month to rule out underlying malignancy - PT eval appreciated, recommends SNF rehab patient is now awaiting SNF bed and insurance approval for transfer to SNF rehab  Assessment & Plan:   Principal Problem:   Acute respiratory failure with hypoxia ([place]) Active Problems:   HTN (hypertension)   Chronic respiratory failure with hypercapnia (H[facility])   Alzheimer's disease ([place])   Hypothyroidism   Oxygen dependent   GERD (gastroesophageal reflux disease)   Carotid artery disease ([place])   1)Acute metabolic encephalopathy----suspect secondary to #2 and # 3 below -Mentation and level of alertness has improved  2)acute on chronic  hypoxic respiratory failure secondary to COPD exacer aspiration pneumonia, radiologist recommends repeat CT in 1 month to rule out underlying malignancy -- Patient was treated with  IV Rocephin, azithromycin mucolytics and bronchodilators and -IV Solu-Medrol -Initially improved significantly  On 01/08/2021--patient developed acute episode of respiratory distress and worsening hypoxia--clinical exam and chest x-ray consistent with possible aspiration -IV Unasyn , supplemental oxygen and  bronchodilators initiated  3)Symptomatic Hyponatremia--Na up to 136 from 123 suspect dehydration related, compounded by medications like HCTZ and Celexa, and trazodone Stopped HCTZ STOPped Celexa and trazodone -Hyponatremia is resolved  4)Dementia with behavioral disturbance--- continue Namenda, may use IV lorazepam as needed agitation  5)HTN-BP is Not at goal,  -start amlodipine 5 mg daily, continue ibuprofen, HCTZ has been discontinued due to hyponatremia -- iv .hydralazine as needed elevated BP  6)Hypothyroidism--- TSH is 1.1, continue levothyroxine  7)Social/Ethics--- discussed with patient's daughter, patient is a DNR/DNI  8)Generalized weakness and ambulatory dysfunction---  -At baseline she is usually relatively independent with a walker -Prior to getting sick and being admitted to the hospital she could perform most ADLs typically, occasionally requiring some help-- PT eval appreciated, recommends SNF rehab patient is now awaiting SNF bed and insurance approval for transfer to SNF rehab  9)Dysphagia--- speech pathologist eval appreciated recommends, -Dysphagia 1 (Puree);Nectar-thick liquid   Disposition/Need for in-Hospital Stay- patient unable to be discharged at this time due to Pneumonia and hypoxia--requiring IV antibiotics, IV fluids  Status is: Inpatient  Remains inpatient appropriate because:   Disposition: The patient is from: Home              Anticipated d/c is to: SNF  Anticipated d/c date is: 1 day             Patient currently is not medically stable to d/c.   Barriers: Awaiting SNF bed and insurance approval for SNF placement   code Status  :  -  Code Status: DNR   Family Communication:   Discussed with daughter/POA [name]----[phone]--updated on 01/07/2021  Consults  :    DVT Prophylaxis  :   - SCDs   enoxaparin (LOVENOX) injection 40 mg Start: 01/04/21 2200   Lab Results  Component Value Date   PLT 243 01/08/2021    Inpatient Medications  Scheduled Meds:  amLODipine  5 mg Oral Daily   ARIPiprazole  2 mg Oral Daily   aspirin EC  81 mg Oral Daily   atorvastatin  40 mg Oral QHS   betaxolol  1 drop Both Eyes q morning   brimonidine  1 drop Both Eyes Daily   buPROPion  200 mg Oral BID   Chlorhexidine Gluconate Cloth  6 each Topical Daily   cholecalciferol  2,000 Units Oral Daily   enoxaparin (LOVENOX) injection  40 mg Subcutaneous Q24H   feeding supplement  237 mL Oral TID BM   fenofibrate  160 mg Oral Daily   fluticasone furoate-vilanterol  1 puff Inhalation Daily   ipratropium-albuterol  3 mL Nebulization Q6H WA   irbesartan  75 mg Oral Daily   ketorolac  1 drop Both Eyes QID   levothyroxine  50 mcg Oral Q0600   mouth rinse  15 mL Mouth Rinse BID   memantine  28 mg Oral Daily   methylPREDNISolone (SOLU-MEDROL) injection  40 mg Intravenous Q12H   montelukast  10 mg Oral QHS   multivitamin with minerals  1 tablet Oral Daily   Netarsudil Dimesylate  1 drop Both Eyes QHS   pantoprazole  40 mg Oral Daily   Continuous Infusions:  sodium chloride 50 mL/hr at 01/06/21 2000   ampicillin-sulbactam (UNASYN) IV 200 mL/hr at 01/08/21 1639   PRN Meds:.acetaminophen, dextromethorphan-guaiFENesin, haloperidol lactate, hydrALAZINE, LORazepam, meclizine, ondansetron **OR** ondansetron (ZOFRAN) IV   Anti-infectives (From admission, onward)    Start     Dose/Rate Route Frequency Ordered Stop   01/08/21 1600  Ampicillin-Sulbactam (UNASYN) 3 g in sodium chloride 0.9 % 100 mL IVPB        3 g 200 mL/hr over 30 Minutes Intravenous Every 8 hours 01/08/21 1508     01/04/21 1015  cefTRIAXone (ROCEPHIN) 2 g in sodium  chloride 0.9 % 100 mL IVPB        2 g 200 mL/hr over 30 Minutes Intravenous Every 24 hours 01/04/21 1003 01/08/21 0909   01/04/21 1015  azithromycin (ZITHROMAX) 500 mg in sodium chloride 0.9 % 250 mL IVPB        500 mg 250 mL/hr over 60 Minutes Intravenous Every 24 hours 01/04/21 1003 01/08/21 1231        Subjective: [name] today has no fevers, no emesis,  No chest pain,   -  On 01/08/2021--patient developed acute episode of respiratory distress and worsening hypoxia--clinical exam and chest x-ray consistent with possible aspiration -IV Unasyn , supplemental oxygen and  bronchodilators initiated  -Patient's daughter and granddaughter at bedside, questions answered  Objective: Vitals:   01/08/21 1405 01/08/21 1605 01/08/21 1717 01/08/21 1841  BP: 103/60 (!) 105/48 (!) 144/81   Pulse: (!) 105 100 (!) 146 (!) 126  Resp: (!) 25 20 (!) 22 (!) 22  Temp:   100.3  F (37.9 C)   TempSrc:   Axillary   SpO2: 99% 95% 93%   Weight:      Height:        Intake/Output Summary (Last 24 hours) at 01/08/2021 2023 Last data filed at 01/08/2021 1727 Gross per 24 hour  Intake 974.99 ml  Output --  Net 974.99 ml   Filed Weights   01/04/21 0917 01/05/21 0600 01/07/21 0600  Weight: 55.8 kg 57.5 kg 58.1 kg   Physical Exam  Gen:-Awake, talkative , in no acute distress HEENT:- [place].AT, No sclera icterus Nose- [place] 4L/min Neck-Supple Neck,No JVD,.  Lungs-diminished breath sounds on the right, scattered wheezes and rhonchi especially on the right CV- S1, S2 normal, regular  Abd-  +ve B.Sounds, Abd Soft, No tenderness,    Extremity/Skin:- No  edema, pedal pulses present  Neuro-Psych-awake, more interactive, underlying cognitive and memory deficits -  Data Reviewed: I have personally reviewed following labs and imaging studies  CBC: Recent Labs  Lab 01/04/21 0955 01/05/21 0558 01/06/21 0440 01/08/21 0502  WBC 13.0* 9.9 10.7* 11.2*  NEUTROABS 11.9*  --   --   --   HGB 10.1* 9.7* 9.2*  9.8*  HCT 30.5* 29.3* 28.7* 31.1*  MCV 88.7 89.3 90.0 88.9  PLT 222 215 211 505   Basic Metabolic Panel: Recent Labs  Lab 01/05/21 0026 01/05/21 0558 01/06/21 0440 01/07/21 0304 01/08/21 0502  NA 127* 128* 130* 130* 136  K 3.8 3.8 3.8 3.8 3.4*  CL 92* 92* 92* 93* 97*  CO2 26 28 30 28 31   GLUCOSE 128* 119* 98 112* 108*  BUN 14 14 18 16 15   CREATININE 0.68 0.53 0.65 0.57 0.60  CALCIUM 8.2* 8.5* 8.3* 8.6* 8.6*  PHOS  --   --  2.2*  --   --    GFR: Estimated Creatinine Clearance: 42.1 mL/min (by C-G formula based on SCr of 0.6 mg/dL). Liver Function Tests: Recent Labs  Lab 01/04/21 0955 01/06/21 0440  AST 92*  --   ALT 43  --   ALKPHOS 46  --   BILITOT 1.0  --   PROT 6.8  --   ALBUMIN 3.7 2.9*   No results for input(s): LIPASE, AMYLASE in the last 168 hours. No results for input(s): AMMONIA in the last 168 hours. Coagulation Profile: Recent Labs  Lab 01/04/21 0955  INR 1.0   Cardiac Enzymes: No results for input(s): CKTOTAL, CKMB, CKMBINDEX, TROPONINI in the last 168 hours. BNP (last 3 results) No results for input(s): PROBNP in the last 8760 hours. HbA1C: No results for input(s): HGBA1C in the last 72 hours. CBG: Recent Labs  Lab 01/05/21 2352 01/06/21 0644 01/06/21 1114 01/07/21 0004 01/07/21 1134  GLUCAP 114* 84 170* 129* 110*   Lipid Profile: No results for input(s): CHOL, HDL, LDLCALC, TRIG, CHOLHDL, LDLDIRECT in the last 72 hours. Thyroid Function Tests: No results for input(s): TSH, T4TOTAL, FREET4, T3FREE, THYROIDAB in the last 72 hours.  Anemia Panel: No results for input(s): VITAMINB12, FOLATE, FERRITIN, TIBC, IRON, RETICCTPCT in the last 72 hours. Urine analysis:    Component Value Date/Time   COLORURINE YELLOW 01/04/2021 [place] 01/04/2021 1204   APPEARANCEUR Clear 06/17/2019 1122   LABSPEC 1.015 01/04/2021 1204   PHURINE 6.0 01/04/2021 1204   GLUCOSEU NEGATIVE 01/04/2021 1204   HGBUR SMALL (A) 01/04/2021 [place] 01/04/2021 1204   BILIRUBINUR Negative 06/17/2019 [place] 01/04/2021 1204   PROTEINUR 30 (A)  01/04/2021 1204   UROBILINOGEN negative 06/07/2012 1606   NITRITE NEGATIVE 01/04/2021 [place] 01/04/2021 1204   Sepsis Labs: @LABRCNTIP (procalcitonin:4,lacticidven:4)  ) Recent Results (from the past 240 hour(s))  Blood culture (routine x 2)     Status: None (Preliminary result)   Collection Time: 01/04/21  9:55 AM   Specimen: Right Antecubital; Blood  Result Value Ref Range Status   Specimen Description RIGHT ANTECUBITAL  Final   Special Requests   Final    BOTTLES DRAWN AEROBIC AND ANAEROBIC Blood Culture adequate volume   Culture   Final    NO GROWTH 4 DAYS Performed at [facility], [address] [place], [place] [address]    Report Status PENDING  Incomplete  Blood culture (routine x 2)     Status: None (Preliminary result)   Collection Time: 01/04/21 10:29 AM   Specimen: BLOOD RIGHT FOREARM  Result Value Ref Range Status   Specimen Description BLOOD RIGHT FOREARM  Final   Special Requests   Final    BOTTLES DRAWN AEROBIC AND ANAEROBIC Blood Culture adequate volume   Culture   Final    NO GROWTH 4 DAYS Performed at [name], [address], [place], [place] [address]    Report Status PENDING  Incomplete  Resp Panel by RT-PCR (Flu A&B, Covid) Urine, Clean Catch     Status: None   Collection Time: 01/04/21 12:04 PM   Specimen: Urine, Clean Catch; Nasopharyngeal(NP) swabs in vial transport medium  Result Value Ref Range Status   SARS Coronavirus 2 by RT PCR NEGATIVE NEGATIVE Final    Comment: (NOTE) SARS-CoV-2 target nucleic acids are NOT DETECTED.  The SARS-CoV-2 RNA is generally detectable in upper respiratory specimens during the acute phase of infection. The lowest concentration of SARS-CoV-2 viral copies this assay can detect is 138 copies/mL. A negative result does not preclude SARS-Cov-2 infection and  should not be used as the sole basis for treatment or other patient management decisions. A negative result may occur with  improper specimen collection/handling, submission of specimen other than nasopharyngeal swab, presence of viral mutation(s) within the areas targeted by this assay, and inadequate number of viral copies(<138 copies/mL). A negative result must be combined with clinical observations, patient history, and epidemiological information. The expected result is Negative.  Fact Sheet for Patients:  EntrepreneurPulse.com.au  Fact Sheet for Healthcare Providers:  IncredibleEmployment.be  This test is no t yet approved or cleared by the [place] FDA and  has been authorized for detection and/or diagnosis of SARS-CoV-2 by FDA under an Emergency Use Authorization (EUA). This EUA will remain  in effect (meaning this test can be used) for the duration of the COVID-19 declaration under Section 564(b)(1) of the Act, 21 U.S.C.section 360bbb-3(b)(1), unless the authorization is terminated  or revoked sooner.       Influenza A by PCR NEGATIVE NEGATIVE Final   Influenza B by PCR NEGATIVE NEGATIVE Final    Comment: (NOTE) The Xpert Xpress SARS-CoV-2/FLU/RSV plus assay is intended as an aid in the diagnosis of influenza from Nasopharyngeal swab specimens and should not be used as a sole basis for treatment. Nasal washings and aspirates are unacceptable for Xpert Xpress SARS-CoV-2/FLU/RSV testing.  Fact Sheet for Patients: EntrepreneurPulse.com.au  Fact Sheet for Healthcare Providers: IncredibleEmployment.be  This test is not yet approved or cleared by the [place] FDA and has been authorized for detection and/or diagnosis of SARS-CoV-2 by FDA under an Emergency Use Authorization (EUA). This EUA will remain  in effect (meaning this test can be used) for the duration of the COVID-19 declaration  under Section 564(b)(1) of the Act, 21 U.S.C. section 360bbb-3(b)(1), unless the authorization is terminated or revoked.  Performed at [facility], [address], [place], [place] [address]   MRSA Next Gen by PCR, Nasal     Status: None   Collection Time: 01/04/21  7:50 PM   Specimen: Nasal Mucosa; Nasal Swab  Result Value Ref Range Status   MRSA by PCR Next Gen NOT DETECTED NOT DETECTED Final    Comment: (NOTE) The GeneXpert MRSA Assay (FDA approved for NASAL specimens only), is one component of a comprehensive MRSA colonization surveillance program. It is not intended to diagnose MRSA infection nor to guide or monitor treatment for MRSA infections. Test performance is not FDA approved in patients less than 55 years old. Performed at [facility], [address], [place], [place] [address]   Expectorated Sputum Assessment w Gram Stain, Rflx to Resp Cult     Status: None   Collection Time: 01/07/21  8:45 AM   Specimen: Sputum  Result Value Ref Range Status   Specimen Description SPUTUM  Final   Special Requests NONE  Final   Sputum evaluation   Final    THIS SPECIMEN IS ACCEPTABLE FOR SPUTUM CULTURE Performed at [facility] [place] [facility], [address], [place], [place] [address]    Report Status 01/07/2021 FINAL  Final  Culture, Respiratory w Gram Stain     Status: None (Preliminary result)   Collection Time: 01/07/21  8:45 AM   Specimen: SPU  Result Value Ref Range Status   Specimen Description   Final    SPUTUM Performed at [facility], [address], [place], [place] [address]    Special Requests   Final    NONE Reflexed from [id number] Performed at [facility], [address], [place], [place] [address]    Gram Stain   Final    RARE SQUAMOUS EPITHELIAL CELLS PRESENT FEW WBC PRESENT, PREDOMINANTLY PMN RARE GRAM POSITIVE RODS    Culture   Final    NO GROWTH < 24 HOURS Performed at [facility], [place] [address], [place], [place] [address]    Report Status PENDING  Incomplete   Resp Panel by RT-PCR (Flu A&B, Covid) Nasopharyngeal Swab     Status: None   Collection Time: 01/08/21 12:20 PM   Specimen: Nasopharyngeal Swab; Nasopharyngeal(NP) swabs in vial transport medium  Result Value Ref Range Status   SARS Coronavirus 2 by RT PCR NEGATIVE NEGATIVE Final    Comment: (NOTE) SARS-CoV-2 target nucleic acids are NOT DETECTED.  The SARS-CoV-2 RNA is generally detectable in upper respiratory specimens during the acute phase of infection. The lowest concentration of SARS-CoV-2 viral copies this assay can detect is 138 copies/mL. A negative result does not preclude SARS-Cov-2 infection and should not be used as the sole basis for treatment or other patient management decisions. A negative result may occur with  improper specimen collection/handling, submission of specimen other than nasopharyngeal swab, presence of viral mutation(s) within the areas targeted by this assay, and inadequate number of viral copies(<138 copies/mL). A negative result must be combined with clinical observations, patient history, and epidemiological information. The expected result is Negative.  Fact Sheet for Patients:  EntrepreneurPulse.com.au  Fact Sheet for Healthcare Providers:  IncredibleEmployment.be  This test is no t yet approved or cleared by the [place] FDA and  has been authorized for detection and/or diagnosis of SARS-CoV-2  by FDA under an Emergency Use Authorization (EUA). This EUA will remain  in effect (meaning this test can be used) for the duration of the COVID-19 declaration under Section 564(b)(1) of the Act, 21 U.S.C.section 360bbb-3(b)(1), unless the authorization is terminated  or revoked sooner.       Influenza A by PCR NEGATIVE NEGATIVE Final   Influenza B by PCR NEGATIVE NEGATIVE Final    Comment: (NOTE) The Xpert Xpress SARS-CoV-2/FLU/RSV plus assay is intended as an aid in the diagnosis of influenza from  Nasopharyngeal swab specimens and should not be used as a sole basis for treatment. Nasal washings and aspirates are unacceptable for Xpert Xpress SARS-CoV-2/FLU/RSV testing.  Fact Sheet for Patients: EntrepreneurPulse.com.au  Fact Sheet for Healthcare Providers: IncredibleEmployment.be  This test is not yet approved or cleared by the [place] FDA and has been authorized for detection and/or diagnosis of SARS-CoV-2 by FDA under an Emergency Use Authorization (EUA). This EUA will remain in effect (meaning this test can be used) for the duration of the COVID-19 declaration under Section 564(b)(1) of the Act, 21 U.S.C. section 360bbb-3(b)(1), unless the authorization is terminated or revoked.  Performed at [facility], [address] [place], [place] [address]      Radiology Studies: [facility] Chest [facility] 1 View  Result Date: 01/08/2021 CLINICAL DATA:  Shortness of breath EXAM: PORTABLE CHEST 1 VIEW COMPARISON:  Previous studies including the examination of 01/04/2021 FINDINGS: Cardiac size is within normal limits. Small patchy infiltrate is seen in the right upper lung fields with interval worsening. There are no signs of alveolar pulmonary edema. Low position of diaphragms suggests COPD. There is no pleural effusion or pneumothorax. IMPRESSION: Small patchy infiltrate is seen in the right upper lung fields with interval worsening suggesting pneumonia. Follow-up studies until complete clearing occurs should be considered to rule out any underlying neoplastic process. Electronically Signed   By: Elmer Picker M.D.   On: 01/08/2021 13:57     Scheduled Meds:  amLODipine  5 mg Oral Daily   ARIPiprazole  2 mg Oral Daily   aspirin EC  81 mg Oral Daily   atorvastatin  40 mg Oral QHS   betaxolol  1 drop Both Eyes q morning   brimonidine  1 drop Both Eyes Daily   buPROPion  200 mg Oral BID   Chlorhexidine Gluconate Cloth  6 each Topical Daily    cholecalciferol  2,000 Units Oral Daily   enoxaparin (LOVENOX) injection  40 mg Subcutaneous Q24H   feeding supplement  237 mL Oral TID BM   fenofibrate  160 mg Oral Daily   fluticasone furoate-vilanterol  1 puff Inhalation Daily   ipratropium-albuterol  3 mL Nebulization Q6H WA   irbesartan  75 mg Oral Daily   ketorolac  1 drop Both Eyes QID   levothyroxine  50 mcg Oral Q0600   mouth rinse  15 mL Mouth Rinse BID   memantine  28 mg Oral Daily   methylPREDNISolone (SOLU-MEDROL) injection  40 mg Intravenous Q12H   montelukast  10 mg Oral QHS   multivitamin with minerals  1 tablet Oral Daily   Netarsudil Dimesylate  1 drop Both Eyes QHS   pantoprazole  40 mg Oral Daily   Continuous Infusions:  sodium chloride 50 mL/hr at 01/06/21 2000   ampicillin-sulbactam (UNASYN) IV 200 mL/hr at 01/08/21 1639     LOS: 4 days   Roxan Hockey M.D on 01/08/2021 at 8:23 PM  Go to [email or web address] - for contact info  [facility] - Office  [phone]  If 7PM-7AM, please contact night-coverage [email or web address] Password [facility] 01/08/2021, 8:23 PM

## 2021-01-08 NOTE — Progress Notes (Signed)
SLP Cancellation Note  Patient Details Name: Katie Bryant MRN: [id number] DOB: [birth year]   Cancelled treatment:       Reason Eval/Treat Not Completed: Fatigue/lethargy limiting ability to participate; per chart review/discussion with nursing staff, pt given Ativan and is unable to participate in ST session at this time.  ST will continue efforts in acute setting.   Elvina Sidle, M.S., CCC-SLP 01/08/2021, 2:52 PM

## 2021-01-08 NOTE — Progress Notes (Signed)
°   01/08/21 1247  Vitals  Temp 97.9 F (36.6 C)  Temp Source Axillary  BP (!) 160/90  BP Location Left Arm  BP Method Manual  Patient Position (if appropriate) Sitting  Pulse Rate (!) 134  Pulse Rate Source Dinamap  Resp (!) 22  MEWS COLOR  MEWS Score Color Red  Oxygen Therapy  SpO2 98 %  O2 Device Nasal Cannula  O2 Flow Rate (L/min) 4 L/min  MEWS Score  MEWS Temp 0  MEWS Systolic 0  MEWS Pulse 3  MEWS RR 1  MEWS LOC 0  MEWS Score 4

## 2021-01-09 DIAGNOSIS — I6523 Occlusion and stenosis of bilateral carotid arteries: Secondary | ICD-10-CM

## 2021-01-09 DIAGNOSIS — E039 Hypothyroidism, unspecified: Secondary | ICD-10-CM

## 2021-01-09 DIAGNOSIS — Z9981 Dependence on supplemental oxygen: Secondary | ICD-10-CM

## 2021-01-09 DIAGNOSIS — I10 Essential (primary) hypertension: Secondary | ICD-10-CM

## 2021-01-09 LAB — RENAL FUNCTION PANEL
Albumin: 3.3 g/dL — ABNORMAL LOW (ref 3.5–5.0)
Anion gap: 8 (ref 5–15)
BUN: 12 mg/dL (ref 8–23)
CO2: 30 mmol/L (ref 22–32)
Calcium: 8.7 mg/dL — ABNORMAL LOW (ref 8.9–10.3)
Chloride: 99 mmol/L (ref 98–111)
Creatinine, Ser: 0.66 mg/dL (ref 0.44–1.00)
GFR, Estimated: >60 mL/min (ref >60)
Glucose, Bld: 101 mg/dL — ABNORMAL HIGH (ref 70–99)
Phosphorus: 1.7 mg/dL — ABNORMAL LOW (ref 2.5–4.6)
Potassium: 3.8 mmol/L (ref 3.5–5.1)
Sodium: 137 mmol/L (ref 135–145)

## 2021-01-09 LAB — CULTURE, BLOOD (ROUTINE X 2)
Culture: NO GROWTH
Culture: NO GROWTH
Special Requests: ADEQUATE
Special Requests: ADEQUATE

## 2021-01-09 LAB — CBC
HCT: 30.2 % — ABNORMAL LOW (ref 36.0–46.0)
Hemoglobin: 9.9 g/dL — ABNORMAL LOW (ref 12.0–15.0)
MCH: 29.3 pg (ref 26.0–34.0)
MCHC: 32.8 g/dL (ref 30.0–36.0)
MCV: 89.3 fL (ref 80.0–100.0)
Platelets: 262 10*3/uL (ref 150–400)
RBC: 3.38 MIL/uL — ABNORMAL LOW (ref 3.87–5.11)
RDW: 12.9 % (ref 11.5–15.5)
WBC: 11.6 10*3/uL — ABNORMAL HIGH (ref 4.0–10.5)
nRBC: 0 % (ref 0.0–0.2)

## 2021-01-09 MED ORDER — IPRATROPIUM-ALBUTEROL 0.5-2.5 (3) MG/3ML IN SOLN
3.0000 mL | Freq: Four times a day (QID) | RESPIRATORY_TRACT | Status: DC
  Administered 2021-01-09 – 2021-01-10 (×3): 3 mL via RESPIRATORY_TRACT
  Filled 2021-01-09 (×3): qty 3

## 2021-01-09 MED ORDER — IPRATROPIUM-ALBUTEROL 0.5-2.5 (3) MG/3ML IN SOLN: 3.0000 mL | RESPIRATORY_TRACT | Status: DC | PRN

## 2021-01-09 NOTE — TOC Progression Note (Signed)
Transition of Care [facility]) - Progression Note    Patient Details  Name: Katie Bryant MRN: [id number] Date of Birth: 1928-10-10  Transition of Care [facility]) CM/SW Contact  Salome Arnt, [place] Phone Number: 01/09/2021, 10:50 AM  Clinical Narrative:  Per MD, possible d/c tomorrow. COVID negative 01/08/21. LCSW discussed with Elyse Hsu at [employer]. COVID test is good for 48 hours. MD notified. Auth received [id number], next review 01/10/21). [facility] additional information sent for review. [facility] pending.      Expected Discharge Plan: [place] Barriers to Discharge: Continued Medical Work up  Expected Discharge Plan and Services Expected Discharge Plan: [place] Choice: [place] arrangements for the past 2 months: Single Family Home                                       Social Determinants of Health (SDOH) Interventions    Readmission Risk Interventions No flowsheet data found.

## 2021-01-09 NOTE — Plan of Care (Signed)

## 2021-01-09 NOTE — Progress Notes (Signed)
Speech Language Pathology Treatment: Dysphagia  Patient Details Name: Katie Bryant MRN: [id number] DOB: 09-13-1928 Today's Date: 01/09/2021 Time: 6010-9323 SLP Time Calculation (min) (ACUTE ONLY): 22 min  Assessment / Plan / Recommendation Clinical Impression  Pt seen for ongoing dysphagia intervention following the clinical swallow evaluation completed on Monday. Pt was less responsive yesterday due to medication and a chest xray completed was suggestive of pneumonia. Pt alert, but confused and fidgeting in bed. She was agreeable to occasional teaspoon presentations of NTL, but required attention to task cues (verbal and tactile). Pt also presented with single ice chips, which she appeared to enjoy and requested more. Pt continues to be at risk for aspiration due to cognitive impairment, COPD with oxygen requirements, deconditioning, and current respiratory compromise. Her daughter expressed an interest in having palliative care involvement to help establish goals of care. She expressed wanting to take her mother home with hospice if that becomes appropriate. Continue D1/puree and NTL and ok for single ice chips when alert and upright. SLP will follow.    HPI HPI: 88 y.o. female with a history of Alzheimer's dementia, hypothyroidism, COPD with chronic respiratory failure on 4 L nasal cannula, coronary artery disease, GERD, HTN admitted on 01/04/2021 with acute metabolic encephalopathy and acute hypoxic respiratory failure secondary to presumed pneumonia, radiologist recommends repeat CT in 1 month to rule out underlying malignancy      SLP Plan  Continue with current plan of care      Recommendations for follow up therapy are one component of a multi-disciplinary discharge planning process, led by the attending physician.  Recommendations may be updated based on patient status, additional functional criteria and insurance authorization.    Recommendations  Diet recommendations: Dysphagia 1  (puree);Nectar-thick liquid Liquids provided via: Cup;Teaspoon Medication Administration: Whole meds with puree Supervision: Staff to assist with self feeding;Full supervision/cueing for compensatory strategies Compensations: Slow rate;Small sips/bites;Monitor for anterior loss Postural Changes and/or Swallow Maneuvers: Seated upright 90 degrees;Upright 30-60 min after meal                Oral Care Recommendations: Oral care BID;Staff/trained caregiver to provide oral care Follow Up Recommendations:  (family wishing to take Pt home) Assistance recommended at discharge: Frequent or constant Supervision/Assistance SLP Visit Diagnosis: Dysphagia, unspecified (R13.10) Plan: Continue with current plan of care          Thank you,  [name], [place]  [place]  01/09/2021, 3:52 PM

## 2021-01-09 NOTE — Progress Notes (Signed)
°   01/09/21 1547  Vitals  BP (!) 158/76  MAP (mmHg) 100  Pulse Rate (!) 125  Pulse Rate Source Monitor  Resp 20  MEWS COLOR  MEWS Score Color Yellow  Oxygen Therapy  SpO2 94 %  O2 Device Nasal Cannula  O2 Flow Rate (L/min) 4 L/min  MEWS Score  MEWS Temp 0  MEWS Systolic 0  MEWS Pulse 2  MEWS RR 0  MEWS LOC 0  MEWS Score 2

## 2021-01-09 NOTE — Progress Notes (Signed)
PROGRESS NOTE     Katie Bryant, is a 88 y.o. female, DOB - 06-07-1928, TIR:[id number]  Admit date - 01/04/2021   Admitting Physician Truett Mainland, DO  Outpatient Primary MD for the patient is Dettinger, Fransisca Kaufmann, MD  LOS - 5  Chief Complaint  Patient presents with   Cough    Brought in by EMS for cough for 3 days.  History of dementia.  Pt is from home with DNR papers at bedside.  Called tele drs, got antibiotic but has not helped.  Weakness noted yesterday, daughter reports pt was leaning right also yesterday.  Having difficulty walking yesterday.  History of COPD.  Breathing tx given by family, EMS noted rhonci  Sats 90's with home o2 @ 4l.  DUO neb given by EMS with improvement  noted.  Given Solumedrol 125 mg IV by EMS.          Brief Narrative:  As per H&P written by Dr. Nehemiah Settle on 01/04/2021 Katie Bryant is a 88 y.o. female with a history of Alzheimer's dementia, hypothyroidism, COPD with chronic respiratory failure on 4 L nasal cannula, coronary artery disease, GERD, hypertension.  Due to her dementia, history is obtained by the patient's daughter and by the ER physician.  Patient has been having cough, shortness of breath, fevers, chills over the past 4 to 5 days.  She was seen by her primary care doctor yesterday and was diagnosed with bronchitis.  She was started on steroids and doxycycline.  She got 1 dose of that and her symptoms have been continued to worsen and the patient was brought here by EMS.  In route, the patient got DuoNeb plus Solu-Medrol.  She has been having to increase her nebulizer treatment.   Emergency Department Course: Patient receive 3 nebulizer treatments.  She also received antibiotics: Rocephin and azithromycin after blood cultures were obtained.  She was given a liter of IV fluids.  White count 13.  Troponin stable at 44.  Creatinine 0.74.  Sodium 123.  Lactic acid 1.3.   Assessment & Plan:   Principal Problem:   Acute respiratory failure  with hypoxia (HCC) Active Problems:   HTN (hypertension)   Chronic respiratory failure with hypercapnia (HCC)   Alzheimer's disease (HCC)   Hypothyroidism   Oxygen dependent   GERD (gastroesophageal reflux disease)   Carotid artery disease (HCC)   1)Acute metabolic encephalopathy -In the setting of hypoxemic respiratory failure, aspiration pneumonia and electrolytes inbalances -Continue supportive care -Consult reorientation -Follow clinical response. -Given underlying history of cognitive impairment high risk for sundowning and hospital-acquired delirium.  2)acute on chronic  hypoxic respiratory failure secondary to COPD exacerbation and aspiration pneumonia, radiologist recommends repeat CT in 1 month to rule out underlying malignancy. -Patient treated with IV Rocephin, Zithromax, mucolytic's and bronchodilators; was receiving the steroids and adequately improving.  She had an episode of what appears to be aspiration pneumonia with subsequent decline in her respiratory status. -Will follow recommendation by speech therapy -Continue Unasyn and the use of lung hygiene's.  3)Symptomatic Hyponatremia--Na up to 136 from 123 suspect dehydration related, compounded by medications like HCTZ and Celexa, and trazodone -HCTZ will be discontinued and at discharge. -Continue holding Celexa and trazodone. -Hyponatremia is resolved  4)Dementia with behavioral disturbance--- continue Namenda, may use IV lorazepam as needed agitation  5)HTN-BP is Not at goal, but fairly well controlled. -start amlodipine 5 mg daily, continue ibuprofen, HCTZ has been discontinued due to hyponatremia -- iv .hydralazine as needed elevated BP  6)Hypothyroidism--- TSH is 1.1, continue levothyroxine  7)Social/Ethics--- discussed with patient's daughter, patient is a DNR/DNI -Palliative care consulted.  8)Generalized weakness and ambulatory dysfunction---  -At baseline she is usually relatively independent with a  walker -Prior to getting sick and being admitted to the hospital she could perform most ADLs typically, occasionally requiring some help-- -PT eval appreciated, recommends SNF rehab patient is now awaiting SNF bed and insurance approval for transfer to SNF rehab. -After further discussion with daughter at bedside they would like involvement of palliative care and explore the possibility of going home with hospice.  9)Dysphagia--- speech pathologist eval appreciated recommends, -Dysphagia 1 (Puree);Nectar-thick liquid   Disposition/Need for in-Hospital Stay- patient unable to be discharged at this time due to Pneumonia and hypoxia--requiring IV antibiotics, IV fluids  Status is: Inpatient  Remains inpatient appropriate because:   Disposition: The patient is from: Home              Anticipated d/c is to: SNF               Anticipated d/c date is: 1 day             Patient currently is not medically stable to d/c.   Barriers: Awaiting SNF bed and insurance approval for SNF placement   code Status :  -  Code Status: DNR   Family Communication:   Discussed with daughter/POA [name]----[phone]--updated on 01/07/2021  Consults  :    DVT Prophylaxis  :   - SCDs   enoxaparin (LOVENOX) injection 40 mg Start: 01/04/21 2200   Lab Results  Component Value Date   PLT 262 01/09/2021    Inpatient Medications  Scheduled Meds:  amLODipine  5 mg Oral Daily   ARIPiprazole  2 mg Oral Daily   aspirin EC  81 mg Oral Daily   atorvastatin  40 mg Oral QHS   betaxolol  1 drop Both Eyes q morning   brimonidine  1 drop Both Eyes Daily   buPROPion  200 mg Oral BID   Chlorhexidine Gluconate Cloth  6 each Topical Daily   cholecalciferol  2,000 Units Oral Daily   enoxaparin (LOVENOX) injection  40 mg Subcutaneous Q24H   feeding supplement  237 mL Oral TID BM   fenofibrate  160 mg Oral Daily   fluticasone furoate-vilanterol  1 puff Inhalation Daily   ipratropium-albuterol  3 mL Nebulization  QID   irbesartan  75 mg Oral Daily   ketorolac  1 drop Both Eyes QID   levothyroxine  50 mcg Oral Q0600   mouth rinse  15 mL Mouth Rinse BID   memantine  28 mg Oral Daily   methylPREDNISolone (SOLU-MEDROL) injection  40 mg Intravenous Q12H   montelukast  10 mg Oral QHS   multivitamin with minerals  1 tablet Oral Daily   Netarsudil Dimesylate  1 drop Both Eyes QHS   pantoprazole  40 mg Oral Daily   Continuous Infusions:  sodium chloride 50 mL/hr at 01/06/21 2000   ampicillin-sulbactam (UNASYN) IV Stopped (01/09/21 1546)   PRN Meds:.acetaminophen, dextromethorphan-guaiFENesin, haloperidol lactate, hydrALAZINE, ipratropium-albuterol, LORazepam, meclizine, ondansetron **OR** ondansetron (ZOFRAN) IV   Anti-infectives (From admission, onward)    Start     Dose/Rate Route Frequency Ordered Stop   01/08/21 1600  Ampicillin-Sulbactam (UNASYN) 3 g in sodium chloride 0.9 % 100 mL IVPB        3 g 200 mL/hr over 30 Minutes Intravenous Every 8 hours 01/08/21 1508     01/04/21  1015  cefTRIAXone (ROCEPHIN) 2 g in sodium chloride 0.9 % 100 mL IVPB        2 g 200 mL/hr over 30 Minutes Intravenous Every 24 hours 01/04/21 1003 01/08/21 0909   01/04/21 1015  azithromycin (ZITHROMAX) 500 mg in sodium chloride 0.9 % 250 mL IVPB        500 mg 250 mL/hr over 60 Minutes Intravenous Every 24 hours 01/04/21 1003 01/08/21 1231        Subjective: [name] no fever, no chest pain, no nausea, no vomiting.  4 L nasal cannula supplementation in place.  Objective: Vitals:   01/09/21 0915 01/09/21 1326 01/09/21 1547 01/09/21 1721  BP:   (!) 158/76   Pulse:   (!) 125   Resp:   20   Temp:      TempSrc:      SpO2: 96% 96% 94% 96%  Weight:      Height:        Intake/Output Summary (Last 24 hours) at 01/09/2021 1850 Last data filed at 01/09/2021 1839 Gross per 24 hour  Intake 503.1 ml  Output 650 ml  Net -146.9 ml   Filed Weights   01/04/21 0917 01/05/21 0600 01/07/21 0600  Weight: 55.8 kg 57.5  kg 58.1 kg   Physical Exam General exam: Somnolent/sleepy; intermittent episodes of confusion and agitation appreciated.  During my exam patient was wearing mittens. Respiratory system: Good saturation on 4 L nasal cannula supplementation; no using accessory muscle.  Positive scattered rhonchi. Cardiovascular system: Irregular.  No rubs, no gallops, no JVD on exam. Gastrointestinal system: Abdomen is nondistended, soft and nontender. No organomegaly or masses felt. Normal bowel sounds heard. Central nervous system: Moving 4 limbs; no focal deficits. Extremities: No cyanosis or clubbing. Skin: No petechiae. Psychiatry: Judgement and insight appear impaired in the setting of underlying dementia.  Data Reviewed: I have personally reviewed following labs and imaging studies  CBC: Recent Labs  Lab 01/04/21 0955 01/05/21 0558 01/06/21 0440 01/08/21 0502 01/09/21 0551  WBC 13.0* 9.9 10.7* 11.2* 11.6*  NEUTROABS 11.9*  --   --   --   --   HGB 10.1* 9.7* 9.2* 9.8* 9.9*  HCT 30.5* 29.3* 28.7* 31.1* 30.2*  MCV 88.7 89.3 90.0 88.9 89.3  PLT 222 215 211 243 308   Basic Metabolic Panel: Recent Labs  Lab 01/05/21 0558 01/06/21 0440 01/07/21 0304 01/08/21 0502 01/09/21 0551  NA 128* 130* 130* 136 137  K 3.8 3.8 3.8 3.4* 3.8  CL 92* 92* 93* 97* 99  CO2 28 30 28 31 30   GLUCOSE 119* 98 112* 108* 101*  BUN 14 18 16 15 12   CREATININE 0.53 0.65 0.57 0.60 0.66  CALCIUM 8.5* 8.3* 8.6* 8.6* 8.7*  PHOS  --  2.2*  --   --  1.7*   GFR: Estimated Creatinine Clearance: 42.1 mL/min (by C-G formula based on SCr of 0.66 mg/dL).  Liver Function Tests: Recent Labs  Lab 01/04/21 0955 01/06/21 0440 01/09/21 0551  AST 92*  --   --   ALT 43  --   --   ALKPHOS 46  --   --   BILITOT 1.0  --   --   PROT 6.8  --   --   ALBUMIN 3.7 2.9* 3.3*    Coagulation Profile: Recent Labs  Lab 01/04/21 0955  INR 1.0    CBG: Recent Labs  Lab 01/05/21 2352 01/06/21 0644 01/06/21 1114 01/07/21 0004  01/07/21 1134  GLUCAP 114*  84 170* 129* 110*   Urine analysis:    Component Value Date/Time   COLORURINE YELLOW 01/04/2021 [place] 01/04/2021 1204   APPEARANCEUR Clear 06/17/2019 1122   LABSPEC 1.015 01/04/2021 1204   PHURINE 6.0 01/04/2021 1204   GLUCOSEU NEGATIVE 01/04/2021 1204   HGBUR SMALL (A) 01/04/2021 [place] 01/04/2021 1204   BILIRUBINUR Negative 06/17/2019 [place] 01/04/2021 1204   PROTEINUR 30 (A) 01/04/2021 1204   UROBILINOGEN negative 06/07/2012 1606   NITRITE NEGATIVE 01/04/2021 [place] 01/04/2021 1204   Sepsis Labs:  Recent Results (from the past 240 hour(s))  Blood culture (routine x 2)     Status: None   Collection Time: 01/04/21  9:55 AM   Specimen: Right Antecubital; Blood  Result Value Ref Range Status   Specimen Description RIGHT ANTECUBITAL  Final   Special Requests   Final    BOTTLES DRAWN AEROBIC AND ANAEROBIC Blood Culture adequate volume   Culture   Final    NO GROWTH 5 DAYS Performed at [facility], [address], [place], [place] [address]    Report Status 01/09/2021 FINAL  Final  Blood culture (routine x 2)     Status: None   Collection Time: 01/04/21 10:29 AM   Specimen: BLOOD RIGHT FOREARM  Result Value Ref Range Status   Specimen Description BLOOD RIGHT FOREARM  Final   Special Requests   Final    BOTTLES DRAWN AEROBIC AND ANAEROBIC Blood Culture adequate volume   Culture   Final    NO GROWTH 5 DAYS Performed at [facility], [address], [place], [place] [address]    Report Status 01/09/2021 FINAL  Final  Resp Panel by RT-PCR (Flu A&B, Covid) Urine, Clean Catch     Status: None   Collection Time: 01/04/21 12:04 PM   Specimen: Urine, Clean Catch; Nasopharyngeal(NP) swabs in vial transport medium  Result Value Ref Range Status   SARS Coronavirus 2 by RT PCR NEGATIVE NEGATIVE Final    Comment: (NOTE) SARS-CoV-2 target nucleic acids are NOT  DETECTED.  The SARS-CoV-2 RNA is generally detectable in upper respiratory specimens during the acute phase of infection. The lowest concentration of SARS-CoV-2 viral copies this assay can detect is 138 copies/mL. A negative result does not preclude SARS-Cov-2 infection and should not be used as the sole basis for treatment or other patient management decisions. A negative result may occur with  improper specimen collection/handling, submission of specimen other than nasopharyngeal swab, presence of viral mutation(s) within the areas targeted by this assay, and inadequate number of viral copies(<138 copies/mL). A negative result must be combined with clinical observations, patient history, and epidemiological information. The expected result is Negative.  Fact Sheet for Patients:  EntrepreneurPulse.com.au  Fact Sheet for Healthcare Providers:  IncredibleEmployment.be  This test is no t yet approved or cleared by the [place] FDA and  has been authorized for detection and/or diagnosis of SARS-CoV-2 by FDA under an Emergency Use Authorization (EUA). This EUA will remain  in effect (meaning this test can be used) for the duration of the COVID-19 declaration under Section 564(b)(1) of the Act, 21 U.S.C.section 360bbb-3(b)(1), unless the authorization is terminated  or revoked sooner.       Influenza A by PCR NEGATIVE NEGATIVE Final   Influenza B by PCR NEGATIVE NEGATIVE Final    Comment: (NOTE) The Xpert Xpress SARS-CoV-2/FLU/RSV plus assay is intended as an aid in the diagnosis of  influenza from Nasopharyngeal swab specimens and should not be used as a sole basis for treatment. Nasal washings and aspirates are unacceptable for Xpert Xpress SARS-CoV-2/FLU/RSV testing.  Fact Sheet for Patients: EntrepreneurPulse.com.au  Fact Sheet for Healthcare Providers: IncredibleEmployment.be  This test is not yet  approved or cleared by the [place] FDA and has been authorized for detection and/or diagnosis of SARS-CoV-2 by FDA under an Emergency Use Authorization (EUA). This EUA will remain in effect (meaning this test can be used) for the duration of the COVID-19 declaration under Section 564(b)(1) of the Act, 21 U.S.C. section 360bbb-3(b)(1), unless the authorization is terminated or revoked.  Performed at [facility], [address] [place], [place] [address]   MRSA Next Gen by PCR, Nasal     Status: None   Collection Time: 01/04/21  7:50 PM   Specimen: Nasal Mucosa; Nasal Swab  Result Value Ref Range Status   MRSA by PCR Next Gen NOT DETECTED NOT DETECTED Final    Comment: (NOTE) The GeneXpert MRSA Assay (FDA approved for NASAL specimens only), is one component of a comprehensive MRSA colonization surveillance program. It is not intended to diagnose MRSA infection nor to guide or monitor treatment for MRSA infections. Test performance is not FDA approved in patients less than 73 years old. Performed at [facility], [address], [place], [place] [address]   Expectorated Sputum Assessment w Gram Stain, Rflx to Resp Cult     Status: None   Collection Time: 01/07/21  8:45 AM   Specimen: Sputum  Result Value Ref Range Status   Specimen Description SPUTUM  Final   Special Requests NONE  Final   Sputum evaluation   Final    THIS SPECIMEN IS ACCEPTABLE FOR SPUTUM CULTURE Performed at [facility], [address], [place], [place] [address]    Report Status 01/07/2021 FINAL  Final  Culture, Respiratory w Gram Stain     Status: None (Preliminary result)   Collection Time: 01/07/21  8:45 AM   Specimen: SPU  Result Value Ref Range Status   Specimen Description   Final    SPUTUM Performed at [facility], [address], [place], [place] [address]    Special Requests   Final    NONE Reflexed from [id number] Performed at [facility], [address] [place], [place] [address]    Gram  Stain   Final    RARE SQUAMOUS EPITHELIAL CELLS PRESENT FEW WBC PRESENT, PREDOMINANTLY PMN RARE GRAM POSITIVE RODS    Culture   Final    CULTURE REINCUBATED FOR BETTER GROWTH Performed at [facility], [place] [address], [place], [place] [address]    Report Status PENDING  Incomplete  Resp Panel by RT-PCR (Flu A&B, Covid) Nasopharyngeal Swab     Status: None   Collection Time: 01/08/21 12:20 PM   Specimen: Nasopharyngeal Swab; Nasopharyngeal(NP) swabs in vial transport medium  Result Value Ref Range Status   SARS Coronavirus 2 by RT PCR NEGATIVE NEGATIVE Final    Comment: (NOTE) SARS-CoV-2 target nucleic acids are NOT DETECTED.  The SARS-CoV-2 RNA is generally detectable in upper respiratory specimens during the acute phase of infection. The lowest concentration of SARS-CoV-2 viral copies this assay can detect is 138 copies/mL. A negative result does not preclude SARS-Cov-2 infection and should not be used as the sole basis for treatment or other patient management decisions. A negative result may occur with  improper specimen collection/handling, submission of specimen other than nasopharyngeal swab, presence of  viral mutation(s) within the areas targeted by this assay, and inadequate number of viral copies(<138 copies/mL). A negative result must be combined with clinical observations, patient history, and epidemiological information. The expected result is Negative.  Fact Sheet for Patients:  EntrepreneurPulse.com.au  Fact Sheet for Healthcare Providers:  IncredibleEmployment.be  This test is no t yet approved or cleared by the [place] FDA and  has been authorized for detection and/or diagnosis of SARS-CoV-2 by FDA under an Emergency Use Authorization (EUA). This EUA will remain  in effect (meaning this test can be used) for the duration of the COVID-19 declaration under Section 564(b)(1) of the Act, 21 U.S.C.section  360bbb-3(b)(1), unless the authorization is terminated  or revoked sooner.       Influenza A by PCR NEGATIVE NEGATIVE Final   Influenza B by PCR NEGATIVE NEGATIVE Final    Comment: (NOTE) The Xpert Xpress SARS-CoV-2/FLU/RSV plus assay is intended as an aid in the diagnosis of influenza from Nasopharyngeal swab specimens and should not be used as a sole basis for treatment. Nasal washings and aspirates are unacceptable for Xpert Xpress SARS-CoV-2/FLU/RSV testing.  Fact Sheet for Patients: EntrepreneurPulse.com.au  Fact Sheet for Healthcare Providers: IncredibleEmployment.be  This test is not yet approved or cleared by the [place] FDA and has been authorized for detection and/or diagnosis of SARS-CoV-2 by FDA under an Emergency Use Authorization (EUA). This EUA will remain in effect (meaning this test can be used) for the duration of the COVID-19 declaration under Section 564(b)(1) of the Act, 21 U.S.C. section 360bbb-3(b)(1), unless the authorization is terminated or revoked.  Performed at [facility], [address] [place], [place] [address]      Radiology Studies: [facility] 1 View  Result Date: 01/08/2021 CLINICAL DATA:  Shortness of breath EXAM: PORTABLE CHEST 1 VIEW COMPARISON:  Previous studies including the examination of 01/04/2021 FINDINGS: Cardiac size is within normal limits. Small patchy infiltrate is seen in the right upper lung fields with interval worsening. There are no signs of alveolar pulmonary edema. Low position of diaphragms suggests COPD. There is no pleural effusion or pneumothorax. IMPRESSION: Small patchy infiltrate is seen in the right upper lung fields with interval worsening suggesting pneumonia. Follow-up studies until complete clearing occurs should be considered to rule out any underlying neoplastic process. Electronically Signed   By: Elmer Picker M.D.   On: 01/08/2021 13:57     Scheduled Meds:   amLODipine  5 mg Oral Daily   ARIPiprazole  2 mg Oral Daily   aspirin EC  81 mg Oral Daily   atorvastatin  40 mg Oral QHS   betaxolol  1 drop Both Eyes q morning   brimonidine  1 drop Both Eyes Daily   buPROPion  200 mg Oral BID   Chlorhexidine Gluconate Cloth  6 each Topical Daily   cholecalciferol  2,000 Units Oral Daily   enoxaparin (LOVENOX) injection  40 mg Subcutaneous Q24H   feeding supplement  237 mL Oral TID BM   fenofibrate  160 mg Oral Daily   fluticasone furoate-vilanterol  1 puff Inhalation Daily   ipratropium-albuterol  3 mL Nebulization QID   irbesartan  75 mg Oral Daily   ketorolac  1 drop Both Eyes QID   levothyroxine  50 mcg Oral Q0600   mouth rinse  15 mL Mouth Rinse BID   memantine  28 mg Oral Daily   methylPREDNISolone (SOLU-MEDROL) injection  40 mg Intravenous Q12H   montelukast  10 mg Oral QHS  multivitamin with minerals  1 tablet Oral Daily   Netarsudil Dimesylate  1 drop Both Eyes QHS   pantoprazole  40 mg Oral Daily   Continuous Infusions:  sodium chloride 50 mL/hr at 01/06/21 2000   ampicillin-sulbactam (UNASYN) IV Stopped (01/09/21 1546)     LOS: 5 days   Barton Dubois M.D on 01/09/2021 at 6:50 PM  Go to [email or web address] - for contact info  [facility] - Office  [phone]  If 7PM-7AM, please contact night-coverage [email or web address] Password [email or web address] 01/09/2021, 6:50 PM

## 2021-01-10 ENCOUNTER — Inpatient Hospital Stay (HOSPITAL_COMMUNITY)
Admit: 2021-01-10 | Discharge: 2021-01-11 | DRG: 951 | Disposition: A | Discharge status: Hospice | Payer: Medicare Other | Source: Ambulatory Visit | Attending: Internal Medicine | Admitting: Internal Medicine

## 2021-01-10 ENCOUNTER — Encounter (INDEPENDENT_AMBULATORY_CARE_PROVIDER_SITE_OTHER): Payer: Medicare Other | Admitting: Ophthalmology

## 2021-01-10 ENCOUNTER — Encounter (HOSPITAL_COMMUNITY): Payer: Self-pay | Admitting: Family Medicine

## 2021-01-10 DIAGNOSIS — E871 Hypo-osmolality and hyponatremia: Secondary | ICD-10-CM | POA: Diagnosis present

## 2021-01-10 DIAGNOSIS — G9341 Metabolic encephalopathy: Secondary | ICD-10-CM | POA: Diagnosis present

## 2021-01-10 DIAGNOSIS — E039 Hypothyroidism, unspecified: Secondary | ICD-10-CM | POA: Diagnosis not present

## 2021-01-10 DIAGNOSIS — Z820 Family history of epilepsy and other diseases of the nervous system: Secondary | ICD-10-CM

## 2021-01-10 DIAGNOSIS — J9621 Acute and chronic respiratory failure with hypoxia: Secondary | ICD-10-CM

## 2021-01-10 DIAGNOSIS — J9601 Acute respiratory failure with hypoxia: Secondary | ICD-10-CM | POA: Diagnosis not present

## 2021-01-10 DIAGNOSIS — G3183 Dementia with Lewy bodies: Secondary | ICD-10-CM | POA: Diagnosis present

## 2021-01-10 DIAGNOSIS — F0280 Dementia in other diseases classified elsewhere without behavioral disturbance: Secondary | ICD-10-CM

## 2021-01-10 DIAGNOSIS — G309 Alzheimer's disease, unspecified: Secondary | ICD-10-CM | POA: Diagnosis present

## 2021-01-10 DIAGNOSIS — F32A Depression, unspecified: Secondary | ICD-10-CM | POA: Diagnosis not present

## 2021-01-10 DIAGNOSIS — I10 Essential (primary) hypertension: Secondary | ICD-10-CM | POA: Diagnosis present

## 2021-01-10 DIAGNOSIS — J690 Pneumonitis due to inhalation of food and vomit: Secondary | ICD-10-CM | POA: Diagnosis present

## 2021-01-10 DIAGNOSIS — Z801 Family history of malignant neoplasm of trachea, bronchus and lung: Secondary | ICD-10-CM | POA: Diagnosis not present

## 2021-01-10 DIAGNOSIS — J441 Chronic obstructive pulmonary disease with (acute) exacerbation: Secondary | ICD-10-CM | POA: Diagnosis not present

## 2021-01-10 DIAGNOSIS — I2510 Atherosclerotic heart disease of native coronary artery without angina pectoris: Secondary | ICD-10-CM | POA: Diagnosis not present

## 2021-01-10 DIAGNOSIS — F419 Anxiety disorder, unspecified: Secondary | ICD-10-CM | POA: Diagnosis present

## 2021-01-10 DIAGNOSIS — A419 Sepsis, unspecified organism: Secondary | ICD-10-CM | POA: Diagnosis not present

## 2021-01-10 DIAGNOSIS — R1310 Dysphagia, unspecified: Secondary | ICD-10-CM | POA: Diagnosis not present

## 2021-01-10 DIAGNOSIS — Z7189 Other specified counseling: Secondary | ICD-10-CM

## 2021-01-10 DIAGNOSIS — R269 Unspecified abnormalities of gait and mobility: Secondary | ICD-10-CM | POA: Diagnosis not present

## 2021-01-10 DIAGNOSIS — Z7982 Long term (current) use of aspirin: Secondary | ICD-10-CM

## 2021-01-10 DIAGNOSIS — Z9981 Dependence on supplemental oxygen: Secondary | ICD-10-CM

## 2021-01-10 DIAGNOSIS — Z87891 Personal history of nicotine dependence: Secondary | ICD-10-CM

## 2021-01-10 DIAGNOSIS — M1990 Unspecified osteoarthritis, unspecified site: Secondary | ICD-10-CM | POA: Diagnosis present

## 2021-01-10 DIAGNOSIS — Z515 Encounter for palliative care: Secondary | ICD-10-CM | POA: Diagnosis not present

## 2021-01-10 DIAGNOSIS — K219 Gastro-esophageal reflux disease without esophagitis: Secondary | ICD-10-CM | POA: Diagnosis present

## 2021-01-10 DIAGNOSIS — F02818 Dementia in other diseases classified elsewhere, unspecified severity, with other behavioral disturbance: Secondary | ICD-10-CM | POA: Diagnosis not present

## 2021-01-10 DIAGNOSIS — Z7951 Long term (current) use of inhaled steroids: Secondary | ICD-10-CM

## 2021-01-10 DIAGNOSIS — Z9010 Acquired absence of unspecified breast and nipple: Secondary | ICD-10-CM

## 2021-01-10 DIAGNOSIS — Z79890 Hormone replacement therapy (postmenopausal): Secondary | ICD-10-CM

## 2021-01-10 DIAGNOSIS — Z79899 Other long term (current) drug therapy: Secondary | ICD-10-CM

## 2021-01-10 DIAGNOSIS — Z66 Do not resuscitate: Secondary | ICD-10-CM | POA: Diagnosis not present

## 2021-01-10 DIAGNOSIS — E860 Dehydration: Secondary | ICD-10-CM | POA: Diagnosis present

## 2021-01-10 DIAGNOSIS — Z831 Family history of other infectious and parasitic diseases: Secondary | ICD-10-CM

## 2021-01-10 DIAGNOSIS — R4189 Other symptoms and signs involving cognitive functions and awareness: Secondary | ICD-10-CM | POA: Diagnosis present

## 2021-01-10 DIAGNOSIS — Z853 Personal history of malignant neoplasm of breast: Secondary | ICD-10-CM

## 2021-01-10 LAB — CULTURE, RESPIRATORY W GRAM STAIN

## 2021-01-10 MED ORDER — ACETAMINOPHEN 650 MG RE SUPP: 650.0000 mg | Freq: Four times a day (QID) | RECTAL | Status: DC | PRN

## 2021-01-10 MED ORDER — BIOTENE DRY MOUTH MT LIQD
15.0000 mL | OROMUCOSAL | Status: DC | PRN
Start: 2021-01-10 — End: 2021-01-11

## 2021-01-10 MED ORDER — ONDANSETRON HCL 4 MG/2ML IJ SOLN: 4.0000 mg | Freq: Four times a day (QID) | INTRAMUSCULAR | Status: DC | PRN

## 2021-01-10 MED ORDER — GLYCOPYRROLATE 0.2 MG/ML IJ SOLN: 0.2000 mg | INTRAMUSCULAR | Status: DC | PRN

## 2021-01-10 MED ORDER — POLYVINYL ALCOHOL 1.4 % OP SOLN: 1.0000 [drp] | Freq: Four times a day (QID) | OPHTHALMIC | Status: DC | PRN

## 2021-01-10 MED ORDER — GLYCOPYRROLATE 1 MG PO TABS: 1.0000 mg | ORAL_TABLET | ORAL | Status: DC | PRN

## 2021-01-10 MED ORDER — HALOPERIDOL LACTATE 5 MG/ML IJ SOLN
0.5000 mg | INTRAMUSCULAR | Status: DC | PRN
  Administered 2021-01-10: 0.5 mg via INTRAVENOUS
  Filled 2021-01-10: qty 1

## 2021-01-10 MED ORDER — HALOPERIDOL LACTATE 2 MG/ML PO CONC
0.5000 mg | ORAL | Status: DC | PRN
  Filled 2021-01-10: qty 0.3

## 2021-01-10 MED ORDER — IPRATROPIUM-ALBUTEROL 0.5-2.5 (3) MG/3ML IN SOLN: 3.0000 mL | RESPIRATORY_TRACT | Status: DC | PRN

## 2021-01-10 MED ORDER — HALOPERIDOL 0.5 MG PO TABS: 0.5000 mg | ORAL_TABLET | ORAL | Status: DC | PRN

## 2021-01-10 MED ORDER — LORAZEPAM 2 MG/ML IJ SOLN
0.5000 mg | INTRAMUSCULAR | Status: DC | PRN
  Administered 2021-01-10: 0.5 mg via INTRAVENOUS
  Filled 2021-01-10: qty 1

## 2021-01-10 MED ORDER — MORPHINE SULFATE (CONCENTRATE) 10 MG/0.5ML PO SOLN
2.5000 mg | ORAL | Status: DC | PRN
  Administered 2021-01-10 – 2021-01-11 (×2): 5 mg via ORAL
  Filled 2021-01-10 (×2): qty 0.5

## 2021-01-10 MED ORDER — BIOTENE DRY MOUTH MT LIQD: 15.0000 mL | OROMUCOSAL | Status: DC | PRN

## 2021-01-10 MED ORDER — ACETAMINOPHEN 325 MG PO TABS: 650.0000 mg | ORAL_TABLET | Freq: Four times a day (QID) | ORAL | Status: DC | PRN

## 2021-01-10 MED ORDER — MORPHINE SULFATE (CONCENTRATE) 10 MG/0.5ML PO SOLN
2.5000 mg | ORAL | Status: DC | PRN
  Administered 2021-01-10 (×2): 5 mg via ORAL
  Filled 2021-01-10 (×2): qty 0.5

## 2021-01-10 MED ORDER — ONDANSETRON 4 MG PO TBDP: 4.0000 mg | ORAL_TABLET | Freq: Four times a day (QID) | ORAL | Status: DC | PRN

## 2021-01-10 MED ORDER — LORAZEPAM 2 MG/ML IJ SOLN
0.5000 mg | INTRAMUSCULAR | Status: DC | PRN
  Administered 2021-01-11: 1 mg via INTRAVENOUS
  Filled 2021-01-10: qty 1

## 2021-01-10 MED ORDER — HALOPERIDOL LACTATE 5 MG/ML IJ SOLN: 0.5000 mg | INTRAMUSCULAR | Status: DC | PRN

## 2021-01-10 NOTE — Discharge Summary (Signed)
Physician Discharge Summary  Katie Bryant ZOX:[id number] DOB: 05/19/1928 DOA: 01/04/2021  PCP: Dettinger, Fransisca Kaufmann, MD  Admit date: 01/04/2021 Discharge date: 01/10/2021  Time spent: 30 minutes  Recommendations for Outpatient Follow-up:  Comfort care and symptomatic management.   Discharge Diagnoses:  Principal Problem:   Acute respiratory failure with hypoxia (HCC) Active Problems:   HTN (hypertension)   Chronic respiratory failure with hypercapnia (HCC)   Alzheimer's disease ([place])   Hypothyroidism   Oxygen dependent   GERD (gastroesophageal reflux disease)   Carotid artery disease ([place])   Discharge Condition: stable and comfortable currently.  Code status: DNR/DNI  Diet recommendation: Comfort feeding dysphagia 1  Filed Weights   01/04/21 0917 01/05/21 0600 01/07/21 0600  Weight: 55.8 kg 57.5 kg 58.1 kg    History of present illness:  As per H&P written by Dr. Nehemiah Settle on 01/04/2021 Katie Bryant is a 88 y.o. female with a history of Alzheimer's dementia, hypothyroidism, COPD with chronic respiratory failure on 4 L nasal cannula, coronary artery disease, GERD, hypertension.  Due to her dementia, history is obtained by the patient's daughter and by the ER physician.  Patient has been having cough, shortness of breath, fevers, chills over the past 4 to 5 days.  She was seen by her primary care doctor yesterday and was diagnosed with bronchitis.  She was started on steroids and doxycycline.  She got 1 dose of that and her symptoms have been continued to worsen and the patient was brought here by EMS.  In route, the patient got DuoNeb plus Solu-Medrol.  She has been having to increase her nebulizer treatment.   Emergency Department Course: Patient receive 3 nebulizer treatments.  She also received antibiotics: Rocephin and azithromycin after blood cultures were obtained.  She was given a liter of IV fluids.  White count 13.  Troponin stable at 44.  Creatinine 0.74.  Sodium  123.  Lactic acid 1.3.  Hospital Course:  1)Acute metabolic encephalopathy -In the setting of hypoxemic respiratory failure, aspiration pneumonia and electrolytes inbalances -Continue supportive care -Constant reorientation provided. -Given underlying history of cognitive impairment high risk for sundowning and hospital-acquired delirium. -Will focus on full comfort care and symptomatic management only.   2)acute on chronic  hypoxic respiratory failure secondary to COPD exacerbation and aspiration pneumonia, radiologist recommends repeat CT in 1 month to rule out underlying malignancy. -Patient treated with IV Rocephin, Zithromax, mucolytic's and bronchodilators; was receiving the steroids and adequately improving.  She had an episode of what appears to be aspiration pneumonia with subsequent decline in her respiratory status; treated with IV Unasyn, bronchodilators and oxygen supplementation. -Condition continued to further decline; the speech therapy has recommended dysphagia 1 with nectar thick liquids. -Further discussion with family triggers involvement of palliative care with decision to transition to full comfort and symptomatic management only. -Life expectancy days to weeks only. -Continue symptomatic management. -No further antibiotics will be provided.   3)Symptomatic Hyponatremia--Na up to 136 from 123 suspect dehydration related, compounded by medications like HCTZ and Celexa, and trazodone -Offending agent has been discontinued; patient received fluid resuscitation with a stabilization of sodium level. -At this moment plan is to transition to full comfort care and no further blood draws or electrolyte checks are anticipated.   4)Dementia with behavioral disturbance- -Patient care has been transition to full comfort -Will use lorazepam and Haldol as needed for behavioral disturbances and agitation.   5)HTN- -Currently not tolerating oral meds, eating or  drinking. -Transitioning to full comfort  care -Stopping chronic medications.   6)Hypothyroidism-- -Synthroid has been discontinue in the setting of comfort care management.   7)Social/Ethics--- discussed with patient's daughter, patient is a DNR/DNI -Palliative care consultation appreciated; patient now full comfort and symptomatic management only.   8)Generalized weakness and ambulatory dysfunction---  -At baseline she is usually relatively independent with a walker -Prior to getting sick and being admitted to the hospital she could perform most ADLs typically, occasionally requiring some help-- -PT eval appreciated, recommends SNF rehab patient is now awaiting SNF bed and insurance approval for transfer to SNF rehab. -Appreciate assistance and cooperation by palliative care; decision has been made to transition to full comfort car and discharge to hospice home.   9)Dysphagia--- speech pathologist eval appreciated recommends, -Dysphagia 1 (Puree);Nectar-thick liquid  -comfort feed.  Procedures: See below for x-ray reports.  Consultations: Palliative care  Discharge Exam: Vitals:   01/10/21 1208 01/10/21 1226  BP:  (!) 162/92  Pulse:  87  Resp:  17  Temp:  99.2 F (37.3 C)  SpO2: 97% 91%   General exam: Somnolent, confused and currently no eating or drinking.  Continue actively declining. Respiratory system: Tachypnea, positive scattered rhonchi; no using accessory muscle.  Good saturation on current 4 L nasal collar supplementation. Cardiovascular system: No rubs, no gallops, no JVD; irregular rate and rhythm. Gastrointestinal system: Abdomen is nondistended, soft and nontender. No organomegaly or masses felt. Normal bowel sounds heard. Central nervous system: No focal deficits.  Moving 4 limbs spontaneously. Extremities: No cyanosis, clubbing or edema. Skin: No petechiae. Psychiatry: Unable to properly assess; but appears impaired secondary to underlying dementia and acute  encephalopathy process.  Discharge Instructions  No Known Allergies  Contact information for after-discharge care     Destination     [facility] .   Service: Skilled Nursing Contact information: [address] [place] [phone]                     The results of significant diagnostics from this hospitalization (including imaging, microbiology, ancillary and laboratory) are listed below for reference.    Significant Diagnostic Studies: CT Head Wo Contrast  Result Date: 01/04/2021 CLINICAL DATA:  TIA EXAM: CT HEAD WITHOUT CONTRAST TECHNIQUE: Contiguous axial images were obtained from the base of the skull through the vertex without intravenous contrast. COMPARISON:  03/15/2012 FINDINGS: Brain: No acute intracranial findings are seen. There are no signs of bleeding. Ventricles are not dilated. There is no shift of midline structures. Cortical sulci are prominent. There is decreased density in the periventricular white matter. Vascular: There are scattered arterial calcifications. Skull: Unremarkable. Sinuses/Orbits: Unremarkable. Other: None IMPRESSION: No acute intracranial findings are seen in the noncontrast CT brain. Atrophy. Small-vessel disease. Electronically Signed   By: Elmer Picker M.D.   On: 01/04/2021 12:32   CT Chest Wo Contrast  Result Date: 01/04/2021 CLINICAL DATA:  Weakness and shortness of breath EXAM: CT CHEST WITHOUT CONTRAST TECHNIQUE: Multidetector CT imaging of the chest was performed following the standard protocol without IV contrast. COMPARISON:  None. FINDINGS: Cardiovascular: Normal heart size. No pericardial effusion. Three-vessel coronary artery calcifications. Atherosclerotic disease of the thoracic aorta. Mediastinum/Nodes: No pathologically enlarged lymph nodes seen in the chest. Esophagus is unremarkable. Lungs/Pleura: Central airways are patent. Centrilobular  emphysema. Irregular nodular opacity of the right upper lobe measuring 2.1 x 1.0 cm on series 4, image 43 with adjacent linear opacities. Linear opacity of the left  upper lobe, likely due to scarring. Upper Abdomen: No acute abnormality. Musculoskeletal: No chest wall mass or suspicious bone lesions identified. IMPRESSION: 1. Nodular linear consolidation of the right upper lobe, possibly infectious or inflammatory, although neoplasm could also have this appearance. Recommend follow-up chest CT in 1 month to ensure resolution. If finding does not resolve, consultation with Pulmonology or Thoracic Surgery recommended. 2. Coronary artery calcifications. 3. Aortic Atherosclerosis (ICD10-I70.0) and Emphysema (ICD10-J43.9). Electronically Signed   By: Yetta Glassman M.D.   On: 01/04/2021 12:50   DG Chest Port 1 View  Result Date: 01/08/2021 CLINICAL DATA:  Shortness of breath EXAM: PORTABLE CHEST 1 VIEW COMPARISON:  Previous studies including the examination of 01/04/2021 FINDINGS: Cardiac size is within normal limits. Small patchy infiltrate is seen in the right upper lung fields with interval worsening. There are no signs of alveolar pulmonary edema. Low position of diaphragms suggests COPD. There is no pleural effusion or pneumothorax. IMPRESSION: Small patchy infiltrate is seen in the right upper lung fields with interval worsening suggesting pneumonia. Follow-up studies until complete clearing occurs should be considered to rule out any underlying neoplastic process. Electronically Signed   By: Elmer Picker M.D.   On: 01/08/2021 13:57   DG Chest Portable 1 View  Result Date: 01/04/2021 CLINICAL DATA:  Shortness of breath EXAM: PORTABLE CHEST 1 VIEW COMPARISON:  01/01/2018 FINDINGS: The heart size and mediastinal contours are within normal limits. Background COPD with chronic interstitial changes. New small irregular opacity is present in the upper right lung. No pleural effusion or pneumothorax. The  visualized skeletal structures are unremarkable. IMPRESSION: Small irregular opacity in the upper right lung. Chest CT recommended to exclude malignancy. Electronically Signed   By: Macy Mis M.D.   On: 01/04/2021 09:58    Microbiology: Recent Results (from the past 240 hour(s))  Blood culture (routine x 2)     Status: None   Collection Time: 01/04/21  9:55 AM   Specimen: Right Antecubital; Blood  Result Value Ref Range Status   Specimen Description RIGHT ANTECUBITAL  Final   Special Requests   Final    BOTTLES DRAWN AEROBIC AND ANAEROBIC Blood Culture adequate volume   Culture   Final    NO GROWTH 5 DAYS Performed at [facility], [address] [place], [place] [address]    Report Status 01/09/2021 FINAL  Final  Blood culture (routine x 2)     Status: None   Collection Time: 01/04/21 10:29 AM   Specimen: BLOOD RIGHT FOREARM  Result Value Ref Range Status   Specimen Description BLOOD RIGHT FOREARM  Final   Special Requests   Final    BOTTLES DRAWN AEROBIC AND ANAEROBIC Blood Culture adequate volume   Culture   Final    NO GROWTH 5 DAYS Performed at [facility], [address] [place], [place] [address]    Report Status 01/09/2021 FINAL  Final  Resp Panel by RT-PCR (Flu A&B, Covid) Urine, Clean Catch     Status: None   Collection Time: 01/04/21 12:04 PM   Specimen: Urine, Clean Catch; Nasopharyngeal(NP) swabs in vial transport medium  Result Value Ref Range Status   SARS Coronavirus 2 by RT PCR NEGATIVE NEGATIVE Final    Comment: (NOTE) SARS-CoV-2 target nucleic acids are NOT DETECTED.  The SARS-CoV-2 RNA is generally detectable in upper respiratory specimens during the acute phase of infection. The lowest concentration of SARS-CoV-2 viral copies this assay can detect is 138 copies/mL. A negative result does not preclude  SARS-Cov-2 infection and should not be used as the sole basis for treatment or other patient management decisions. A negative result may occur with   improper specimen collection/handling, submission of specimen other than nasopharyngeal swab, presence of viral mutation(s) within the areas targeted by this assay, and inadequate number of viral copies(<138 copies/mL). A negative result must be combined with clinical observations, patient history, and epidemiological information. The expected result is Negative.  Fact Sheet for Patients:  EntrepreneurPulse.com.au  Fact Sheet for Healthcare Providers:  IncredibleEmployment.be  This test is no t yet approved or cleared by the [place] FDA and  has been authorized for detection and/or diagnosis of SARS-CoV-2 by FDA under an Emergency Use Authorization (EUA). This EUA will remain  in effect (meaning this test can be used) for the duration of the COVID-19 declaration under Section 564(b)(1) of the Act, 21 U.S.C.section 360bbb-3(b)(1), unless the authorization is terminated  or revoked sooner.       Influenza A by PCR NEGATIVE NEGATIVE Final   Influenza B by PCR NEGATIVE NEGATIVE Final    Comment: (NOTE) The Xpert Xpress SARS-CoV-2/FLU/RSV plus assay is intended as an aid in the diagnosis of influenza from Nasopharyngeal swab specimens and should not be used as a sole basis for treatment. Nasal washings and aspirates are unacceptable for Xpert Xpress SARS-CoV-2/FLU/RSV testing.  Fact Sheet for Patients: EntrepreneurPulse.com.au  Fact Sheet for Healthcare Providers: IncredibleEmployment.be  This test is not yet approved or cleared by the [place] FDA and has been authorized for detection and/or diagnosis of SARS-CoV-2 by FDA under an Emergency Use Authorization (EUA). This EUA will remain in effect (meaning this test can be used) for the duration of the COVID-19 declaration under Section 564(b)(1) of the Act, 21 U.S.C. section 360bbb-3(b)(1), unless the authorization is terminated  or revoked.  Performed at [facility], [address] [place], [place] [address]   MRSA Next Gen by PCR, Nasal     Status: None   Collection Time: 01/04/21  7:50 PM   Specimen: Nasal Mucosa; Nasal Swab  Result Value Ref Range Status   MRSA by PCR Next Gen NOT DETECTED NOT DETECTED Final    Comment: (NOTE) The GeneXpert MRSA Assay (FDA approved for NASAL specimens only), is one component of a comprehensive MRSA colonization surveillance program. It is not intended to diagnose MRSA infection nor to guide or monitor treatment for MRSA infections. Test performance is not FDA approved in patients less than 32 years old. Performed at [facility], [address], [place], [place] [address]   Expectorated Sputum Assessment w Gram Stain, Rflx to Resp Cult     Status: None   Collection Time: 01/07/21  8:45 AM   Specimen: Sputum  Result Value Ref Range Status   Specimen Description SPUTUM  Final   Special Requests NONE  Final   Sputum evaluation   Final    THIS SPECIMEN IS ACCEPTABLE FOR SPUTUM CULTURE Performed at [facility], [address] [place], [place] [address]    Report Status 01/07/2021 FINAL  Final  Culture, Respiratory w Gram Stain     Status: None   Collection Time: 01/07/21  8:45 AM   Specimen: SPU  Result Value Ref Range Status   Specimen Description   Final    SPUTUM Performed at [facility], [address], [place], [place] [address]    Special Requests   Final    NONE Reflexed from [id number] Performed at [facility], [address], [place], [place]  [address]    Gram Stain   Final    RARE SQUAMOUS EPITHELIAL CELLS PRESENT FEW WBC PRESENT, PREDOMINANTLY PMN RARE GRAM POSITIVE RODS Performed at [facility], [place] [address], [place], [place] [address]    Culture RARE CANDIDA ALBICANS  Final   Report Status 01/10/2021 FINAL  Final  Resp Panel by RT-PCR (Flu A&B, Covid) Nasopharyngeal Swab     Status: None   Collection Time: 01/08/21 12:20 PM   Specimen:  Nasopharyngeal Swab; Nasopharyngeal(NP) swabs in vial transport medium  Result Value Ref Range Status   SARS Coronavirus 2 by RT PCR NEGATIVE NEGATIVE Final    Comment: (NOTE) SARS-CoV-2 target nucleic acids are NOT DETECTED.  The SARS-CoV-2 RNA is generally detectable in upper respiratory specimens during the acute phase of infection. The lowest concentration of SARS-CoV-2 viral copies this assay can detect is 138 copies/mL. A negative result does not preclude SARS-Cov-2 infection and should not be used as the sole basis for treatment or other patient management decisions. A negative result may occur with  improper specimen collection/handling, submission of specimen other than nasopharyngeal swab, presence of viral mutation(s) within the areas targeted by this assay, and inadequate number of viral copies(<138 copies/mL). A negative result must be combined with clinical observations, patient history, and epidemiological information. The expected result is Negative.  Fact Sheet for Patients:  EntrepreneurPulse.com.au  Fact Sheet for Healthcare Providers:  IncredibleEmployment.be  This test is no t yet approved or cleared by the [place] FDA and  has been authorized for detection and/or diagnosis of SARS-CoV-2 by FDA under an Emergency Use Authorization (EUA). This EUA will remain  in effect (meaning this test can be used) for the duration of the COVID-19 declaration under Section 564(b)(1) of the Act, 21 U.S.C.section 360bbb-3(b)(1), unless the authorization is terminated  or revoked sooner.       Influenza A by PCR NEGATIVE NEGATIVE Final   Influenza B by PCR NEGATIVE NEGATIVE Final    Comment: (NOTE) The Xpert Xpress SARS-CoV-2/FLU/RSV plus assay is intended as an aid in the diagnosis of influenza from Nasopharyngeal swab specimens and should not be used as a sole basis for treatment. Nasal washings and aspirates are unacceptable for  Xpert Xpress SARS-CoV-2/FLU/RSV testing.  Fact Sheet for Patients: EntrepreneurPulse.com.au  Fact Sheet for Healthcare Providers: IncredibleEmployment.be  This test is not yet approved or cleared by the [place] FDA and has been authorized for detection and/or diagnosis of SARS-CoV-2 by FDA under an Emergency Use Authorization (EUA). This EUA will remain in effect (meaning this test can be used) for the duration of the COVID-19 declaration under Section 564(b)(1) of the Act, 21 U.S.C. section 360bbb-3(b)(1), unless the authorization is terminated or revoked.  Performed at [facility], [address] [place],  [address]     Labs: Basic Metabolic Panel: Recent Labs  Lab 01/05/21 0558 01/06/21 0440 01/07/21 0304 01/08/21 0502 01/09/21 0551  NA 128* 130* 130* 136 137  K 3.8 3.8 3.8 3.4* 3.8  CL 92* 92* 93* 97* 99  CO2 28 30 28 31 30   GLUCOSE 119* 98 112* 108* 101*  BUN 14 18 16 15 12   CREATININE 0.53 0.65 0.57 0.60 0.66  CALCIUM 8.5* 8.3* 8.6* 8.6* 8.7*  PHOS  --  2.2*  --   --  1.7*   Liver Function Tests: Recent Labs  Lab 01/04/21 0955 01/06/21 0440 01/09/21 0551  AST 92*  --   --   ALT 43  --   --  ALKPHOS 46  --   --   BILITOT 1.0  --   --   PROT 6.8  --   --   ALBUMIN 3.7 2.9* 3.3*   CBC: Recent Labs  Lab 01/04/21 0955 01/05/21 0558 01/06/21 0440 01/08/21 0502 01/09/21 0551  WBC 13.0* 9.9 10.7* 11.2* 11.6*  NEUTROABS 11.9*  --   --   --   --   HGB 10.1* 9.7* 9.2* 9.8* 9.9*  HCT 30.5* 29.3* 28.7* 31.1* 30.2*  MCV 88.7 89.3 90.0 88.9 89.3  PLT 222 215 211 243 262    BNP (last 3 results) Recent Labs    01/04/21 0955  BNP 205.0*   CBG: Recent Labs  Lab 01/05/21 2352 01/06/21 0644 01/06/21 1114 01/07/21 0004 01/07/21 1134  GLUCAP 114* 84 170* 129* 110*    Signed:  Barton Dubois MD.  [facility] 01/10/2021, 5:18 PM

## 2021-01-10 NOTE — H&P (Signed)
History and Physical    Katie Bryant XFG:[id number] DOB: 12-24-1928 DOA: 01/10/2021  PCP: Dettinger, Fransisca Kaufmann, MD   Patient coming from: [facility]IP  I have personally briefly reviewed patient's old medical records in [place]  Chief Complaint: comfort care and symptomatic management. Acute on chronic resp failure with hypoxia.  HPI: Katie Bryant is a 88 y.o. female with a history of Alzheimer's dementia, hypothyroidism, COPD with chronic respiratory failure on 4 L nasal cannula, coronary artery disease, GERD, hypertension.  Due to her dementia, history is obtained by the patient's daughter and by the ER physician.  Patient has been having cough, shortness of breath, fevers, chills over the past 4 to 5 days.  She was seen by her primary care doctor yesterday and was diagnosed with bronchitis.  She was started on steroids and doxycycline.  She got 1 dose of that and her symptoms have been continued to worsen and the patient was brought here by EMS.  In route, the patient got DuoNeb plus Solu-Medrol.  She has been having to increase her nebulizer treatment.   Emergency Department Course: Patient receive 3 nebulizer treatments.  She also received antibiotics: Rocephin and azithromycin after blood cultures were obtained.  She was given a liter of IV fluids.  White count 13.  Troponin stable at 44.  Creatinine 0.74.  Sodium 123.  Lactic acid 1.3.  Review of Systems: As per HPI otherwise all other systems reviewed and are negative.  Past Medical History:  Diagnosis Date   Alzheimer's disease ([place]) 03/29/2015   Anxiety    Aortic stenosis, mild    Arthritis    Asthma    Back pain    Breast cancer ([place])    Carotid artery disease ([place]) 01/01/2017   COPD (chronic obstructive pulmonary disease) ([facility])    Dementia    Depression    GERD (gastroesophageal reflux disease)    H/O hiatal hernia    Hypertension    Hypothyroidism 06/25/2016   Lymphadenopathy 10/07/2012   Obesity    Oxygen  dependent    Pneumonia    Right hamstring muscle strain 04/26/2013   Sleep apnea     Past Surgical History:  Procedure Laterality Date   APPENDECTOMY     BREAST RECONSTRUCTION     CARDIAC CATHETERIZATION     EF 55-60%   MASTECTOMY  1980   TONSILLECTOMY AND ADENOIDECTOMY  1942    Social History  reports that she quit smoking about 30 years ago. Her smoking use included cigarettes. She has a 40.00 pack-year smoking history. She has never used smokeless tobacco. She reports current alcohol use of about 7.0 standard drinks per week. She reports that she does not use drugs.  No Known Allergies  Family History  Problem Relation Age of Onset   Tuberculosis Mother    Lung cancer Father    Parkinson's disease Brother    Parkinson's disease Daughter    Prior to Admission medications   Medication Sig Start Date End Date Taking? Authorizing Provider  acetaminophen (TYLENOL) 500 MG tablet Take 500 mg by mouth every 6 (six) hours as needed (pain).     [provider]  ARIPiprazole (ABILIFY) 2 MG tablet Take 1 tablet (2 mg total) by mouth daily. 11/01/20   Dettinger, Fransisca Kaufmann, MD  aspirin 81 MG tablet Take 81 mg by mouth daily.    [provider]  atorvastatin (LIPITOR) 40 MG tablet Take 1 tablet (40 mg total) by mouth daily. Patient taking  differently: Take 1 tablet by mouth at bedtime. 05/02/20   Dettinger, Fransisca Kaufmann, MD  betaxolol (BETOPTIC-S) 0.25 % ophthalmic suspension Place 1 drop into both eyes every morning.    [provider]  brimonidine (ALPHAGAN) 0.2 % ophthalmic solution Place 1 drop into both eyes daily.    [provider]  budesonide (PULMICORT) 0.5 MG/2ML nebulizer solution NEBULIZE 1 VIAL TWICE A DAY 04/19/20   Tanda Rockers, MD  buPROPion (WELLBUTRIN SR) 200 MG 12 hr tablet Take 1 tablet (200 mg total) by mouth 2 (two) times daily. 11/01/20   Dettinger, Fransisca Kaufmann, MD  chlorpheniramine (CHLOR-TRIMETON) 4 MG tablet Take 4 mg by mouth every 4  (four) hours as needed (drippy nose, drainage).    [provider]  Cholecalciferol (VITAMIN D) 50 MCG (2000 UT) tablet Take 2,000 Units by mouth daily.    [provider]  citalopram (CELEXA) 20 MG tablet Take 1 tablet (20 mg total) by mouth daily. 11/01/20   Dettinger, Fransisca Kaufmann, MD  dextromethorphan (DELSYM) 30 MG/5ML liquid Take 30 mg 2 (two) times daily as needed by mouth for cough.    [provider]  dextromethorphan-guaiFENesin (MUCINEX DM) 30-600 MG 12hr tablet Take 1 tablet 2 (two) times daily as needed by mouth for cough.    [provider]  fenofibrate micronized (LOFIBRA) 134 MG capsule TAKE (1) CAPSULE DAILY BEFORE BREAKFAST. 01/01/21   Minus Breeding, MD  ipratropium-albuterol (DUONEB) 0.5-2.5 (3) MG/3ML SOLN NEBULIZE 1 VIAL EVERY 6 HOURS AS NEEDED 12/10/20   Tanda Rockers, MD  ketorolac (ACULAR) 0.5 % ophthalmic solution Place 1 drop into both eyes 4 (four) times daily.    [provider]  levothyroxine (SYNTHROID) 50 MCG tablet Take 1 tablet (50 mcg total) by mouth daily. 11/01/20   Dettinger, Fransisca Kaufmann, MD  meclizine (ANTIVERT) 25 MG tablet Take 25 mg by mouth every 8 (eight) hours as needed for dizziness.    [provider]  memantine (NAMENDA XR) 28 MG CP24 24 hr capsule Take 1 capsule (28 mg total) by mouth daily. 11/01/20   Dettinger, Fransisca Kaufmann, MD  metoprolol tartrate (LOPRESSOR) 25 MG tablet TAKE (1/2) TABLET TWICE DAILY. 08/20/20   Minus Breeding, MD  montelukast (SINGULAIR) 10 MG tablet Take 1 tablet (10 mg total) by mouth at bedtime. 11/01/20   Dettinger, Fransisca Kaufmann, MD  omeprazole (PRILOSEC) 20 MG capsule TAKE  (1)  CAPSULE  TWICE DAILY (TAKE ON AN EMPTY STOMACH AT LEAST 30 MIN- UTES BEFORE MEALS). 09/17/20   Tanda Rockers, MD  OXYGEN Oxygen 4l pm 24/7    [provider]  predniSONE (DELTASONE) 20 MG tablet 2 po at same time daily for 5 days 01/02/21   Janora Norlander, DO  PROAIR HFA 108 [phone] Base) MCG/ACT  inhaler INHALE 2 PUFFS BY MOUTH EVERY 6 HOURS AS NEEDED 08/22/15   Tanda Rockers, MD  Respiratory Therapy Supplies (FLUTTER) DEVI As needed    [provider]  Respiratory Therapy Supplies (NEBULIZER MASK ADULT) [place] 1 each by Does not apply route every 6 (six) weeks. 01/02/21   Gottschalk, Ashly M, DO  RHOPRESSA 0.02 % SOLN Place 1 drop into both eyes at bedtime. 08/05/16   [provider]  sodium chloride (OCEAN) 0.65 % SOLN nasal spray 2 puffs every 4-6 hours as needed for nasal congestion    [provider]  traZODone (DESYREL) 50 MG tablet TAKE 1/2 TO 1 TABLET AT BEDTIME AS NEEDED FOR SLEEP Patient taking  differently: Take 50 mg by mouth at bedtime. TAKE 1/2 TO 1 TABLET AT BEDTIME AS NEEDED FOR SLEEP 05/02/20   Dettinger, Fransisca Kaufmann, MD  valsartan-hydrochlorothiazide (DIOVAN-HCT) 80-12.5 MG tablet Take 1 tablet by mouth daily. 11/01/20   Dettinger, Fransisca Kaufmann, MD    Physical Exam: General exam: Somnolent, confused and currently no eating or drinking.  Continue actively declining. Respiratory system: Tachypnea, positive scattered rhonchi; no using accessory muscle.  Good saturation on current 4 L nasal collar supplementation. Cardiovascular system: No rubs, no gallops, no JVD; irregular rate and rhythm. Gastrointestinal system: Abdomen is nondistended, soft and nontender. No organomegaly or masses felt. Normal bowel sounds heard. Central nervous system: No focal deficits.  Moving 4 limbs spontaneously. Extremities: No cyanosis, clubbing or edema. Skin: No petechiae. Psychiatry: Unable to properly assess; but appears impaired secondary to underlying dementia and acute encephalopathy process.  Labs on Admission: I have personally reviewed following labs and imaging studies  CBC: Recent Labs  Lab 01/04/21 0955 01/05/21 0558 01/06/21 0440 01/08/21 0502 01/09/21 0551  WBC 13.0* 9.9 10.7* 11.2* 11.6*  NEUTROABS 11.9*  --   --   --   --   HGB 10.1* 9.7* 9.2* 9.8*  9.9*  HCT 30.5* 29.3* 28.7* 31.1* 30.2*  MCV 88.7 89.3 90.0 88.9 89.3  PLT 222 215 211 243 128    Basic Metabolic Panel: Recent Labs  Lab 01/05/21 0558 01/06/21 0440 01/07/21 0304 01/08/21 0502 01/09/21 0551  NA 128* 130* 130* 136 137  K 3.8 3.8 3.8 3.4* 3.8  CL 92* 92* 93* 97* 99  CO2 28 30 28 31 30   GLUCOSE 119* 98 112* 108* 101*  BUN 14 18 16 15 12   CREATININE 0.53 0.65 0.57 0.60 0.66  CALCIUM 8.5* 8.3* 8.6* 8.6* 8.7*  PHOS  --  2.2*  --   --  1.7*    GFR: Estimated Creatinine Clearance: 42.1 mL/min (by C-G formula based on SCr of 0.66 mg/dL).  Liver Function Tests: Recent Labs  Lab 01/04/21 0955 01/06/21 0440 01/09/21 0551  AST 92*  --   --   ALT 43  --   --   ALKPHOS 46  --   --   BILITOT 1.0  --   --   PROT 6.8  --   --   ALBUMIN 3.7 2.9* 3.3*    Urine analysis:    Component Value Date/Time   COLORURINE YELLOW 01/04/2021 [place] 01/04/2021 1204   APPEARANCEUR Clear 06/17/2019 1122   LABSPEC 1.015 01/04/2021 1204   PHURINE 6.0 01/04/2021 [place] 01/04/2021 1204   HGBUR SMALL (A) 01/04/2021 1204   BILIRUBINUR NEGATIVE 01/04/2021 1204   BILIRUBINUR Negative 06/17/2019 1122   [place] 01/04/2021 1204   PROTEINUR 30 (A) 01/04/2021 1204   UROBILINOGEN negative 06/07/2012 1606   NITRITE NEGATIVE 01/04/2021 1204   LEUKOCYTESUR NEGATIVE 01/04/2021 1204    Radiological Exams on Admission: No results found.   Assessment/Plan 1)Acute metabolic encephalopathy -In the setting of hypoxemic respiratory failure, aspiration pneumonia and electrolytes inbalances -Continue supportive care -Constant reorientation provided. -Given underlying history of cognitive impairment high risk for sundowning and hospital-acquired delirium. -Will focus on full comfort care and symptomatic management only.   2)acute on chronic  hypoxic respiratory failure secondary to COPD exacerbation and aspiration pneumonia, radiologist  recommends repeat CT in 1 month to rule out underlying malignancy. -Patient treated with IV Rocephin, Zithromax, mucolytic's and bronchodilators; was receiving the steroids and adequately improving.  She had an episode of what appears to be aspiration pneumonia with subsequent decline in her respiratory status; treated with IV Unasyn, bronchodilators and oxygen supplementation. -Condition continued to further decline; the speech therapy has recommended dysphagia 1 with nectar thick liquids. -Further discussion with family triggers involvement of palliative care with decision to transition to full comfort and symptomatic management only. -Life expectancy days to weeks only. -Continue symptomatic management. -No further antibiotics will be provided.   3)Symptomatic Hyponatremia--Na up to 136 from 123 suspect dehydration related, compounded by medications like HCTZ and Celexa, and trazodone -Offending agent has been discontinued; patient received fluid resuscitation with a stabilization of sodium level. -At this moment plan is to transition to full comfort care and no further blood draws or electrolyte checks are anticipated.   4)Dementia with behavioral disturbance- -Patient care has been transition to full comfort -Will use lorazepam and Haldol as needed for behavioral disturbances and agitation.   5)HTN- -Currently not tolerating oral meds, eating or drinking. -Transitioning to full comfort care -Stopping chronic medications.   6)Hypothyroidism-- -Synthroid has been discontinue in the setting of comfort care management.   7)Social/Ethics--- discussed with patient's daughter, patient is a DNR/DNI -Palliative care consultation appreciated; patient now full comfort and symptomatic management only.   8)Generalized weakness and ambulatory dysfunction---  -At baseline she is usually relatively independent with a walker -Prior to getting sick and being admitted to the hospital she could perform  most ADLs typically, occasionally requiring some help-- -PT eval appreciated, recommends SNF rehab patient is now awaiting SNF bed and insurance approval for transfer to SNF rehab. -Appreciate assistance and cooperation by palliative care; accepted by hospice for [facility]IP. Will continue comfort care in house while waiting on bed availability at residential hospice facility.  DVT prophylaxis: SCD's Code Status:   DNR/DNI Family Communication:  Daughter at bedside. Disposition Plan:   Patient is from:  home  Anticipated DC to:  Hospice facility if is stable when available; Hospital death also a possibility.  Anticipated DC date:  To be determine.  Anticipated DC barriers: Bed at hospice facility.  Consults called:  Palliative care, hospice. Admission status:  GIP, comfort care and symptomatic management only  Barton Dubois MD [facility]  How to contact the [facility] Attending or Consulting provider [place] or covering provider during after hours [place], for this patient?   Check the care team in [facility] and look for a) attending/consulting [facility] provider listed and b) the [facility] team listed Log into [email or web address] and use [place]'s universal password to access. If you do not have the password, please contact the hospital operator. Locate the [facility] provider you are looking for under [facility] and page to a number that you can be directly reached. If you still have difficulty reaching the provider, please page the [facility] (Director on Call) for the Hospitalists listed on amion for assistance.  01/10/2021, 5:37 PM

## 2021-01-10 NOTE — Consult Note (Addendum)
Consultation Note Date: 01/10/2021   Patient Name: Katie Bryant  DOB: 30-Jan-1928  MRN: [id number]  Age / Sex: 88 y.o., female  PCP: Dettinger, Fransisca Kaufmann, MD Referring Physician: Barton Dubois, MD  Reason for Consultation: Establishing goals of care and Inpatient hospice referral  HPI/Patient Profile: 88 y.o. female  with past medical history of dementia for 5+ years, COPD with chronic respiratory failure on 4 L, CAD, HTN, GERD, hypothyroid admitted on 01/04/2021 with acute metabolic encephalopathy in the setting of hypoxemic respiratory failure/aspiration pneumonia, acute on chronic respiratory failure secondary to COPD exacerbation and aspiration pneumonia.   Clinical Assessment and Goals of Care: I have reviewed medical records including EPIC notes, labs and imaging, received report from RN, assessed the patient.  Katie Bryant is lying quietly in bed.  She does not open her eyes when I call her name.  She does not interact with me in any meaningful way.  She is clearly unable to make her basic needs known.  Her daughter/healthcare agent, Mala, is at bedside.  We meet to discuss diagnosis prognosis, GOC, EOL wishes, disposition and options.   I introduced Palliative Medicine as specialized medical care for people living with serious illness. It focuses on providing relief from the symptoms and stress of a serious illness. The goal is to improve quality of life for both the patient and the family.  We discussed a brief life review of the patient Katie Bryant husband died under hospice care with [facility] of [place] in 2016.  She was a homemaker.  She has 3 children, Katie Bryant is her healthcare surrogate.    We and then focused on their current illness.  Mala shares a story of decline over the past few months, weeks in particular.  She shares that Katie Bryant first became sick at the end of December.  She shares  that she has not been interacting in any meaningful way for several days.  Mala shares her experience with hospice for her father.  She shares that she feels that it is time for hospice care.  The natural disease trajectory and expectations at EOL were discussed.  Advanced directives, concepts specific to code status, were considered and discussed.  [place] form completed and placed on chart.  Hospice Care services outpatient were explained and offered.  We talked about hospice care, what is and is not provided.  Katie Bryant is experienced as her father had hospice care in 2016.  We talked about at home hospice versus residential hospice.  Provider choice offered.  Mala shares that she would prefer for her mother to be admitted to [facility], [place].  We talked about medicines for comfort and dignity, no further lab draws or or needlesticks.  Mala is ready to start comfort care here in the hospital.  Orders adjusted, end-of-life order set implemented.  Discussed the importance of continued conversation with family and the medical providers regarding overall plan of care and treatment options, ensuring decisions are within the context of the patients values  and GOCs.  Questions and concerns were addressed.  The family was encouraged to call with questions or concerns.  PMT will continue to support holistically.  Conference with attending, bedside nursing staff, transition of care team related to patient condition, needs, goals of care, residential hospice referral   [place] -daughter, Katie Bryant    SUMMARY OF RECOMMENDATIONS   Requesting comfort and dignity at end-of-life, residential hospice to let nature take its course [place]: DNR -goldenrod form completed and placed on chart  Symptom Management:  End-of-life order set implemented  Palliative Prophylaxis:  Frequent Pain Assessment, Oral Care, and Turn  Reposition  Additional Recommendations (Limitations, Scope, Preferences): Full Comfort Care  Psycho-social/Spiritual:  Desire for further Chaplaincy support:no Additional Recommendations: Caregiving  Support/Resources and Education on Hospice  Prognosis:  < 2 weeks anticipated based on acuity of condition, end-stage dementia poor by mouth intake, frailty.  Discharge Planning:  Requesting comfort and dignity at end-of-life, residential hospice at [place].       Primary Diagnoses: Present on Admission:  Acute respiratory failure with hypoxia (HCC)  Alzheimer's disease (Katie Bryant)  Carotid artery disease (Katie Bryant)  Chronic respiratory failure with hypercapnia (HCC)  HTN (hypertension)  Hypothyroidism  GERD (gastroesophageal reflux disease)   I have reviewed the medical record, interviewed the patient and family, and examined the patient. The following aspects are pertinent.  Past Medical History:  Diagnosis Date   Alzheimer's disease (Katie Bryant) 03/29/2015   Anxiety    Aortic stenosis, mild    Arthritis    Asthma    Back pain    Breast cancer (HCC)    Carotid artery disease (Katie Bryant) 01/01/2017   COPD (chronic obstructive pulmonary disease) (HCC)    Dementia    Depression    GERD (gastroesophageal reflux disease)    H/O hiatal hernia    Hypertension    Hypothyroidism 06/25/2016   Lymphadenopathy 10/07/2012   Obesity    Oxygen dependent    Pneumonia    Right hamstring muscle strain 04/26/2013   Sleep apnea    Social History   Socioeconomic History   Marital status: Widowed    Spouse name: Not on file   Number of children: Not on file   Years of education: Not on file   Highest education level: Not on file  Occupational History   Not on file  Tobacco Use   Smoking status: Former    Packs/day: 1.00    Years: 40.00    Pack years: 40.00    Types: Cigarettes    Quit date: 02/11/1990    Years since quitting: 30.9   Smokeless tobacco: Never  Vaping Use   Vaping Use: Never  used  Substance and Sexual Activity   Alcohol use: Yes    Alcohol/week: 7.0 standard drinks    Types: 7 Glasses of wine per week   Drug use: No   Sexual activity: Not Currently  Other Topics Concern   Not on file  Social History Narrative   Not on file   Social Determinants of Health   Financial Resource Strain: Not on file  Food Insecurity: Not on file  Transportation Needs: Not on file  Physical Activity: Not on file  Stress: Not on file  Social Connections: Not on file   Family History  Problem Relation Age of Onset   Tuberculosis Mother    Lung cancer Father    Parkinson's disease Brother    Parkinson's disease Daughter  Scheduled Meds:  amLODipine  5 mg Oral Daily   ARIPiprazole  2 mg Oral Daily   aspirin EC  81 mg Oral Daily   atorvastatin  40 mg Oral QHS   betaxolol  1 drop Both Eyes q morning   brimonidine  1 drop Both Eyes Daily   buPROPion  200 mg Oral BID   Chlorhexidine Gluconate Cloth  6 each Topical Daily   cholecalciferol  2,000 Units Oral Daily   enoxaparin (LOVENOX) injection  40 mg Subcutaneous Q24H   feeding supplement  237 mL Oral TID BM   fenofibrate  160 mg Oral Daily   fluticasone furoate-vilanterol  1 puff Inhalation Daily   ipratropium-albuterol  3 mL Nebulization QID   irbesartan  75 mg Oral Daily   ketorolac  1 drop Both Eyes QID   levothyroxine  50 mcg Oral Q0600   mouth rinse  15 mL Mouth Rinse BID   memantine  28 mg Oral Daily   methylPREDNISolone (SOLU-MEDROL) injection  40 mg Intravenous Q12H   montelukast  10 mg Oral QHS   multivitamin with minerals  1 tablet Oral Daily   Netarsudil Dimesylate  1 drop Both Eyes QHS   pantoprazole  40 mg Oral Daily   Continuous Infusions:  sodium chloride 50 mL/hr at 01/06/21 2000   ampicillin-sulbactam (UNASYN) IV 3 g (01/10/21 0919)   PRN Meds:.acetaminophen, dextromethorphan-guaiFENesin, haloperidol lactate, hydrALAZINE, ipratropium-albuterol, LORazepam, meclizine, ondansetron **OR**  ondansetron (ZOFRAN) IV Medications Prior to Admission:  Prior to Admission medications   Medication Sig Start Date End Date Taking? Authorizing Provider  acetaminophen (TYLENOL) 500 MG tablet Take 500 mg by mouth every 6 (six) hours as needed (pain).    Yes [provider]  ARIPiprazole (ABILIFY) 2 MG tablet Take 1 tablet (2 mg total) by mouth daily. 11/01/20  Yes Dettinger, Fransisca Kaufmann, MD  aspirin 81 MG tablet Take 81 mg by mouth daily.   Yes [provider]  atorvastatin (LIPITOR) 40 MG tablet Take 1 tablet (40 mg total) by mouth daily. Patient taking differently: Take 1 tablet by mouth at bedtime. 05/02/20  Yes Dettinger, Fransisca Kaufmann, MD  betaxolol (BETOPTIC-S) 0.25 % ophthalmic suspension Place 1 drop into both eyes every morning.   Yes [provider]  brimonidine (ALPHAGAN) 0.2 % ophthalmic solution Place 1 drop into both eyes daily.   Yes [provider]  budesonide (PULMICORT) 0.5 MG/2ML nebulizer solution NEBULIZE 1 VIAL TWICE A DAY 04/19/20  Yes Tanda Rockers, MD  buPROPion (WELLBUTRIN SR) 200 MG 12 hr tablet Take 1 tablet (200 mg total) by mouth 2 (two) times daily. 11/01/20  Yes Dettinger, Fransisca Kaufmann, MD  chlorpheniramine (CHLOR-TRIMETON) 4 MG tablet Take 4 mg by mouth every 4 (four) hours as needed (drippy nose, drainage).   Yes [provider]  Cholecalciferol (VITAMIN D) 50 MCG (2000 UT) tablet Take 2,000 Units by mouth daily.   Yes [provider]  citalopram (CELEXA) 20 MG tablet Take 1 tablet (20 mg total) by mouth daily. 11/01/20  Yes Dettinger, Fransisca Kaufmann, MD  dextromethorphan (DELSYM) 30 MG/5ML liquid Take 30 mg 2 (two) times daily as needed by mouth for cough.   Yes [provider]  dextromethorphan-guaiFENesin (MUCINEX DM) 30-600 MG 12hr tablet Take 1 tablet 2 (two) times daily as needed by mouth for cough.   Yes [provider]  fenofibrate micronized (LOFIBRA) 134 MG capsule TAKE (1) CAPSULE DAILY BEFORE  BREAKFAST. 01/01/21  Yes Minus Breeding, MD  ipratropium-albuterol (DUONEB)  0.5-2.5 (3) MG/3ML SOLN NEBULIZE 1 VIAL EVERY 6 HOURS AS NEEDED 12/10/20  Yes Tanda Rockers, MD  ketorolac (ACULAR) 0.5 % ophthalmic solution Place 1 drop into both eyes 4 (four) times daily.   Yes [provider]  levothyroxine (SYNTHROID) 50 MCG tablet Take 1 tablet (50 mcg total) by mouth daily. 11/01/20  Yes Dettinger, Fransisca Kaufmann, MD  meclizine (ANTIVERT) 25 MG tablet Take 25 mg by mouth every 8 (eight) hours as needed for dizziness.   Yes [provider]  memantine (NAMENDA XR) 28 MG CP24 24 hr capsule Take 1 capsule (28 mg total) by mouth daily. 11/01/20  Yes Dettinger, Fransisca Kaufmann, MD  metoprolol tartrate (LOPRESSOR) 25 MG tablet TAKE (1/2) TABLET TWICE DAILY. 08/20/20  Yes Minus Breeding, MD  montelukast (SINGULAIR) 10 MG tablet Take 1 tablet (10 mg total) by mouth at bedtime. 11/01/20  Yes Dettinger, Fransisca Kaufmann, MD  omeprazole (PRILOSEC) 20 MG capsule TAKE  (1)  CAPSULE  TWICE DAILY (TAKE ON AN EMPTY STOMACH AT LEAST 30 MIN- UTES BEFORE MEALS). 09/17/20  Yes Tanda Rockers, MD  predniSONE (DELTASONE) 20 MG tablet 2 po at same time daily for 5 days 01/02/21  Yes Gottschalk, Ashly M, DO  PROAIR HFA 108 (90 Base) MCG/ACT inhaler INHALE 2 PUFFS BY MOUTH EVERY 6 HOURS AS NEEDED 08/22/15  Yes Tanda Rockers, MD  RHOPRESSA 0.02 % SOLN Place 1 drop into both eyes at bedtime. 08/05/16  Yes [provider]  sodium chloride (OCEAN) 0.65 % SOLN nasal spray 2 puffs every 4-6 hours as needed for nasal congestion   Yes [provider]  traZODone (DESYREL) 50 MG tablet TAKE 1/2 TO 1 TABLET AT BEDTIME AS NEEDED FOR SLEEP Patient taking differently: Take 50 mg by mouth at bedtime. TAKE 1/2 TO 1 TABLET AT BEDTIME AS NEEDED FOR SLEEP 05/02/20  Yes Dettinger, Fransisca Kaufmann, MD  valsartan-hydrochlorothiazide (DIOVAN-HCT) 80-12.5 MG tablet Take 1 tablet by mouth daily. 11/01/20  Yes Dettinger, Fransisca Kaufmann, MD  OXYGEN  Oxygen 4l pm 24/7    [provider]  Respiratory Therapy Supplies (FLUTTER) DEVI As needed    [provider]  Respiratory Therapy Supplies (NEBULIZER MASK ADULT) MISC 1 each by Does not apply route every 6 (six) weeks. 01/02/21   Janora Norlander, DO   No Known Allergies Review of Systems  Unable to perform ROS: Patient unresponsive   Physical Exam Vitals and nursing note reviewed.  Constitutional:      General: She is not in acute distress.    Appearance: She is ill-appearing.  Cardiovascular:     Rate and Rhythm: Normal rate.  Pulmonary:     Effort: Pulmonary effort is normal. No respiratory distress.  Musculoskeletal:        General: No swelling.  Skin:    General: Skin is warm and dry.    Vital Signs: BP (!) 197/97    Pulse (!) 126    Temp 98.7 F (37.1 C)    Resp 20    Ht 5\' 5"  (1.651 m)    Wt 58.1 kg    SpO2 97%    BMI 21.31 kg/m  Pain Scale: 0-10 POSS *See Group Information*: 1-Acceptable,Awake and alert Pain Score: 0-No pain   SpO2: SpO2: 97 % O2 Device:SpO2: 97 % O2 Flow Rate: .O2 Flow Rate (L/min): 4 L/min  IO: Intake/output summary:  Intake/Output Summary (Last 24 hours) at 01/10/2021 1151 Last data filed at 01/10/2021 0900 Gross per 24 hour  Intake 186.21 ml  Output 1650 ml  Net -1463.79 ml    LBM: Last BM Date: 01/09/21 Baseline Weight: Weight: 55.8 kg Most recent weight: Weight: 58.1 kg     Palliative Assessment/Data:   Flowsheet Rows    Flowsheet Row Most Recent Value  Intake Tab   Referral Department Hospitalist  Unit at Time of Referral Med/Surg Unit  Palliative Care Primary Diagnosis Pulmonary  Date Notified 01/09/21  Palliative Care Type New Palliative care  Reason for referral Clarify Goals of Care, Counsel Regarding Hospice  Date of Admission 01/04/21  Date first seen by Palliative Care 01/10/21  # of days Palliative referral response time 1 Day(s)  # of days IP prior to Palliative referral 5  Clinical Assessment    Palliative Performance Scale Score 20%  Pain Max last 24 hours Not able to report  Pain Min Last 24 hours Not able to report  Dyspnea Max Last 24 Hours Not able to report  Dyspnea Min Last 24 hours Not able to report  Psychosocial & Spiritual Assessment   Palliative Care Outcomes        Time In: 1110 Time Out: 1225 Time Total: 75 minutes  Greater than 50%  of this time was spent counseling and coordinating care related to the above assessment and plan.  Signed by: Drue Novel, NP   Please contact Palliative Medicine Team phone at [phone] for questions and concerns.  For individual provider: See Shea Evans

## 2021-01-10 NOTE — Progress Notes (Signed)
PROGRESS NOTE     Katie Bryant, is a 88 y.o. female, DOB - [birth year], HYW:[id number]  Admit date - 01/04/2021   Admitting Physician Truett Mainland, DO  Outpatient Primary MD for the patient is Dettinger, Fransisca Kaufmann, MD  LOS - 6  Chief Complaint  Patient presents with   Cough    Brought in by EMS for cough for 3 days.  History of dementia.  Pt is from home with DNR papers at bedside.  Called tele drs, got antibiotic but has not helped.  Weakness noted yesterday, daughter reports pt was leaning right also yesterday.  Having difficulty walking yesterday.  History of COPD.  Breathing tx given by family, EMS noted rhonci  Sats 90's with home o2 @ 4l.  DUO neb given by EMS with improvement  noted.  Given Solumedrol 125 mg IV by EMS.          Brief Narrative:  As per H&P written by Dr. Nehemiah Settle on 01/04/2021 Katie Bryant is a 88 y.o. female with a history of Alzheimer's dementia, hypothyroidism, COPD with chronic respiratory failure on 4 L nasal cannula, coronary artery disease, GERD, hypertension.  Due to her dementia, history is obtained by the patient's daughter and by the ER physician.  Patient has been having cough, shortness of breath, fevers, chills over the past 4 to 5 days.  She was seen by her primary care doctor yesterday and was diagnosed with bronchitis.  She was started on steroids and doxycycline.  She got 1 dose of that and her symptoms have been continued to worsen and the patient was brought here by EMS.  In route, the patient got DuoNeb plus Solu-Medrol.  She has been having to increase her nebulizer treatment.   Emergency Department Course: Patient receive 3 nebulizer treatments.  She also received antibiotics: Rocephin and azithromycin after blood cultures were obtained.  She was given a liter of IV fluids.  White count 13.  Troponin stable at 44.  Creatinine 0.74.  Sodium 123.  Lactic acid 1.3.  01/10/21 -After goals of care discussion and palliative care involvement  decision has been made to transition to full comfort and symptomatic management only.  Patient found to be a candidate for inpatient hospice; pending bed availability and hospice acceptance at this point. -Prognosis is very poor and life expectancy suggested to be days to less than 2 weeks.   Assessment & Plan:   Principal Problem:   Acute respiratory failure with hypoxia (HCC) Active Problems:   HTN (hypertension)   Chronic respiratory failure with hypercapnia (HCC)   Alzheimer's disease (HCC)   Hypothyroidism   Oxygen dependent   GERD (gastroesophageal reflux disease)   Carotid artery disease (HCC)   1)Acute metabolic encephalopathy -In the setting of hypoxemic respiratory failure, aspiration pneumonia and electrolytes inbalances -Continue supportive care -Constant reorientation provided. -Given underlying history of cognitive impairment high risk for sundowning and hospital-acquired delirium. -Will focus on full comfort care and symptomatic management only.  2)acute on chronic  hypoxic respiratory failure secondary to COPD exacerbation and aspiration pneumonia, radiologist recommends repeat CT in 1 month to rule out underlying malignancy. -Patient treated with IV Rocephin, Zithromax, mucolytic's and bronchodilators; was receiving the steroids and adequately improving.  She had an episode of what appears to be aspiration pneumonia with subsequent decline in her respiratory status; treated with IV Unasyn, bronchodilators and oxygen supplementation. -Condition continued to further decline; the speech therapy has recommended dysphagia 1 with nectar thick liquids. -Further discussion with  family triggers involvement of palliative care with decision to transition to full comfort and symptomatic management only. -Life expectancy days to weeks only. -Continue symptomatic management. -No further antibiotics will be provided.  3)Symptomatic Hyponatremia--Na up to 136 from 123 suspect  dehydration related, compounded by medications like HCTZ and Celexa, and trazodone -Offending agent has been discontinued; patient received fluid resuscitation with a stabilization of sodium level. -At this moment plan is to transition to full comfort care and no further blood draws or electrolyte checks are anticipated.  4)Dementia with behavioral disturbance- -Patient care has been transition to full comfort -Will use lorazepam and Haldol as needed for behavioral disturbances and agitation.  5)HTN- -Currently not tolerating oral meds, eating or drinking. -Transitioning to full comfort care -Stopping chronic medications.  6)Hypothyroidism-- -Synthroid has been discontinue in the setting of comfort care management.  7)Social/Ethics--- discussed with patient's daughter, patient is a DNR/DNI -Palliative care consultation appreciated; patient now full comfort and symptomatic management only.  8)Generalized weakness and ambulatory dysfunction---  -At baseline she is usually relatively independent with a walker -Prior to getting sick and being admitted to the hospital she could perform most ADLs typically, occasionally requiring some help-- -PT eval appreciated, recommends SNF rehab patient is now awaiting SNF bed and insurance approval for transfer to SNF rehab. -Appreciate assistance and cooperation by palliative care; decision has been made to transition to full comfort car and discharge to hospice home.  9)Dysphagia--- speech pathologist eval appreciated recommends, -Dysphagia 1 (Puree);Nectar-thick liquid   Disposition/Need for in-Hospital Stay-patient actively declining and now transitioning into full comfort care with plans to be discharged to hospice home.  Status is: Inpatient  Remains inpatient appropriate because:   Disposition: The patient is from: Home              Anticipated d/c is to: SNF               Anticipated d/c date is: Pending bed availability at hospice house.              Patient currently is not medically stable to d/c.    Barriers: Patient condition has further decline; after discussion with daughter at bedside physician to involve palliative care and transitioning to full comfort care has been decided.  Patient is a candidate for inpatient hospice; hospice has been requested and will follow acceptance and bed availability.  code Status :  -  Code Status: DNR   Family Communication:   Discussed with daughter/POA [name]----[phone]--updated on 01/10/2021  Consults  :    DVT Prophylaxis  :   - SCDs      Lab Results  Component Value Date   PLT 262 01/09/2021    Inpatient Medications  PRN Meds:.acetaminophen **OR** acetaminophen, antiseptic oral rinse, glycopyrrolate **OR** glycopyrrolate **OR** glycopyrrolate, haloperidol **OR** haloperidol **OR** haloperidol lactate, ipratropium-albuterol, LORazepam, morphine CONCENTRATE, ondansetron **OR** ondansetron (ZOFRAN) IV, polyvinyl alcohol   Anti-infectives (From admission, onward)    Start     Dose/Rate Route Frequency Ordered Stop   01/08/21 1600  Ampicillin-Sulbactam (UNASYN) 3 g in sodium chloride 0.9 % 100 mL IVPB  Status:  Discontinued        3 g 200 mL/hr over 30 Minutes Intravenous Every 8 hours 01/08/21 1508 01/10/21 1219   01/04/21 1015  cefTRIAXone (ROCEPHIN) 2 g in sodium chloride 0.9 % 100 mL IVPB        2 g 200 mL/hr over 30 Minutes Intravenous Every 24 hours 01/04/21 1003 01/08/21 0909  01/04/21 1015  azithromycin (ZITHROMAX) 500 mg in sodium chloride 0.9 % 250 mL IVPB        500 mg 250 mL/hr over 60 Minutes Intravenous Every 24 hours 01/04/21 1003 01/08/21 1231        Subjective: [name] currently afebrile; no chest pain.  Unable to properly follow commands, confused, somnolent, no eating or drinking.  Chronically ill in appearance.  Objective: Vitals:   01/10/21 0519 01/10/21 0832 01/10/21 1208 01/10/21 1226  BP: (!) 197/97   (!) 162/92  Pulse: (!)  126   87  Resp: 20   17  Temp: 98.7 F (37.1 C)   99.2 F (37.3 C)  TempSrc:      SpO2:  97% 97% 91%  Weight:      Height:        Intake/Output Summary (Last 24 hours) at 01/10/2021 1243 Last data filed at 01/10/2021 0900 Gross per 24 hour  Intake 186.21 ml  Output 1650 ml  Net -1463.79 ml   Filed Weights   01/04/21 0917 01/05/21 0600 01/07/21 0600  Weight: 55.8 kg 57.5 kg 58.1 kg   Physical Exam General exam: Somnolent, confused and currently no eating or drinking.  Continue actively declining. Respiratory system: Tachypnea, positive scattered rhonchi; no using accessory muscle.  Good saturation on current 4 L nasal collar supplementation. Cardiovascular system: No rubs, no gallops, no JVD; irregular rate and rhythm. Gastrointestinal system: Abdomen is nondistended, soft and nontender. No organomegaly or masses felt. Normal bowel sounds heard. Central nervous system: No focal deficits.  Moving 4 limbs spontaneously. Extremities: No cyanosis, clubbing or edema. Skin: No petechiae. Psychiatry: Unable to properly assess; but appears impaired secondary to underlying dementia and acute encephalopathy process.  Data Reviewed: I have personally reviewed following labs and imaging studies  CBC: Recent Labs  Lab 01/04/21 0955 01/05/21 0558 01/06/21 0440 01/08/21 0502 01/09/21 0551  WBC 13.0* 9.9 10.7* 11.2* 11.6*  NEUTROABS 11.9*  --   --   --   --   HGB 10.1* 9.7* 9.2* 9.8* 9.9*  HCT 30.5* 29.3* 28.7* 31.1* 30.2*  MCV 88.7 89.3 90.0 88.9 89.3  PLT 222 215 211 243 240   Basic Metabolic Panel: Recent Labs  Lab 01/05/21 0558 01/06/21 0440 01/07/21 0304 01/08/21 0502 01/09/21 0551  NA 128* 130* 130* 136 137  K 3.8 3.8 3.8 3.4* 3.8  CL 92* 92* 93* 97* 99  CO2 28 30 28 31 30   GLUCOSE 119* 98 112* 108* 101*  BUN 14 18 16 15 12   CREATININE 0.53 0.65 0.57 0.60 0.66  CALCIUM 8.5* 8.3* 8.6* 8.6* 8.7*  PHOS  --  2.2*  --   --  1.7*   GFR: Estimated Creatinine Clearance:  42.1 mL/min (by C-G formula based on SCr of 0.66 mg/dL).  Liver Function Tests: Recent Labs  Lab 01/04/21 0955 01/06/21 0440 01/09/21 0551  AST 92*  --   --   ALT 43  --   --   ALKPHOS 46  --   --   BILITOT 1.0  --   --   PROT 6.8  --   --   ALBUMIN 3.7 2.9* 3.3*    Coagulation Profile: Recent Labs  Lab 01/04/21 0955  INR 1.0    CBG: Recent Labs  Lab 01/05/21 2352 01/06/21 0644 01/06/21 1114 01/07/21 0004 01/07/21 1134  GLUCAP 114* 84 170* 129* 110*   Urine analysis:    Component Value Date/Time   COLORURINE YELLOW 01/04/2021 1204  APPEARANCEUR CLEAR 01/04/2021 1204   APPEARANCEUR Clear 06/17/2019 1122   LABSPEC 1.015 01/04/2021 1204   PHURINE 6.0 01/04/2021 [place] 01/04/2021 1204   HGBUR SMALL (A) 01/04/2021 [place] 01/04/2021 1204   BILIRUBINUR Negative 06/17/2019 [place] 01/04/2021 1204   PROTEINUR 30 (A) 01/04/2021 1204   UROBILINOGEN negative 06/07/2012 1606   NITRITE NEGATIVE 01/04/2021 [place] 01/04/2021 1204   Sepsis Labs:  Recent Results (from the past 240 hour(s))  Blood culture (routine x 2)     Status: None   Collection Time: 01/04/21  9:55 AM   Specimen: Right Antecubital; Blood  Result Value Ref Range Status   Specimen Description RIGHT ANTECUBITAL  Final   Special Requests   Final    BOTTLES DRAWN AEROBIC AND ANAEROBIC Blood Culture adequate volume   Culture   Final    NO GROWTH 5 DAYS Performed at [facility], [address], [place], [place] [address]    Report Status 01/09/2021 FINAL  Final  Blood culture (routine x 2)     Status: None   Collection Time: 01/04/21 10:29 AM   Specimen: BLOOD RIGHT FOREARM  Result Value Ref Range Status   Specimen Description BLOOD RIGHT FOREARM  Final   Special Requests   Final    BOTTLES DRAWN AEROBIC AND ANAEROBIC Blood Culture adequate volume   Culture   Final    NO GROWTH 5 DAYS Performed at [facility],  [address], [place], [place] [address]    Report Status 01/09/2021 FINAL  Final  Resp Panel by RT-PCR (Flu A&B, Covid) Urine, Clean Catch     Status: None   Collection Time: 01/04/21 12:04 PM   Specimen: Urine, Clean Catch; Nasopharyngeal(NP) swabs in vial transport medium  Result Value Ref Range Status   SARS Coronavirus 2 by RT PCR NEGATIVE NEGATIVE Final    Comment: (NOTE) SARS-CoV-2 target nucleic acids are NOT DETECTED.  The SARS-CoV-2 RNA is generally detectable in upper respiratory specimens during the acute phase of infection. The lowest concentration of SARS-CoV-2 viral copies this assay can detect is 138 copies/mL. A negative result does not preclude SARS-Cov-2 infection and should not be used as the sole basis for treatment or other patient management decisions. A negative result may occur with  improper specimen collection/handling, submission of specimen other than nasopharyngeal swab, presence of viral mutation(s) within the areas targeted by this assay, and inadequate number of viral copies(<138 copies/mL). A negative result must be combined with clinical observations, patient history, and epidemiological information. The expected result is Negative.  Fact Sheet for Patients:  EntrepreneurPulse.com.au  Fact Sheet for Healthcare Providers:  IncredibleEmployment.be  This test is no t yet approved or cleared by the [place] FDA and  has been authorized for detection and/or diagnosis of SARS-CoV-2 by FDA under an Emergency Use Authorization (EUA). This EUA will remain  in effect (meaning this test can be used) for the duration of the COVID-19 declaration under Section 564(b)(1) of the Act, 21 U.S.C.section 360bbb-3(b)(1), unless the authorization is terminated  or revoked sooner.       Influenza A by PCR NEGATIVE NEGATIVE Final   Influenza B by PCR NEGATIVE NEGATIVE Final    Comment: (NOTE) The Xpert Xpress SARS-CoV-2/FLU/RSV  plus assay is intended as an aid in the diagnosis of influenza from Nasopharyngeal swab specimens and should not be used as a sole basis for treatment. Nasal washings and aspirates are  unacceptable for Xpert Xpress SARS-CoV-2/FLU/RSV testing.  Fact Sheet for Patients: EntrepreneurPulse.com.au  Fact Sheet for Healthcare Providers: IncredibleEmployment.be  This test is not yet approved or cleared by the [place] FDA and has been authorized for detection and/or diagnosis of SARS-CoV-2 by FDA under an Emergency Use Authorization (EUA). This EUA will remain in effect (meaning this test can be used) for the duration of the COVID-19 declaration under Section 564(b)(1) of the Act, 21 U.S.C. section 360bbb-3(b)(1), unless the authorization is terminated or revoked.  Performed at [facility], [address], [place], [place] [address]   MRSA Next Gen by PCR, Nasal     Status: None   Collection Time: 01/04/21  7:50 PM   Specimen: Nasal Mucosa; Nasal Swab  Result Value Ref Range Status   MRSA by PCR Next Gen NOT DETECTED NOT DETECTED Final    Comment: (NOTE) The GeneXpert MRSA Assay (FDA approved for NASAL specimens only), is one component of a comprehensive MRSA colonization surveillance program. It is not intended to diagnose MRSA infection nor to guide or monitor treatment for MRSA infections. Test performance is not FDA approved in patients less than 36 years old. Performed at [facility], [address], [place], [place] [address]   Expectorated Sputum Assessment w Gram Stain, Rflx to Resp Cult     Status: None   Collection Time: 01/07/21  8:45 AM   Specimen: Sputum  Result Value Ref Range Status   Specimen Description SPUTUM  Final   Special Requests NONE  Final   Sputum evaluation   Final    THIS SPECIMEN IS ACCEPTABLE FOR SPUTUM CULTURE Performed at [facility], [address], [place], [place] [address]    Report Status 01/07/2021  FINAL  Final  Culture, Respiratory w Gram Stain     Status: None   Collection Time: 01/07/21  8:45 AM   Specimen: SPU  Result Value Ref Range Status   Specimen Description   Final    SPUTUM Performed at [facility], [address], [place], [place] [address]    Special Requests   Final    NONE Reflexed from [id number] Performed at [facility], [address] [place], [place] [address]    Gram Stain   Final    RARE SQUAMOUS EPITHELIAL CELLS PRESENT FEW WBC PRESENT, PREDOMINANTLY PMN RARE GRAM POSITIVE RODS Performed at [facility], [place] [address], [place],  [address]    Culture RARE CANDIDA ALBICANS  Final   Report Status 01/10/2021 FINAL  Final  Resp Panel by RT-PCR (Flu A&B, Covid) Nasopharyngeal Swab     Status: None   Collection Time: 01/08/21 12:20 PM   Specimen: Nasopharyngeal Swab; Nasopharyngeal(NP) swabs in vial transport medium  Result Value Ref Range Status   SARS Coronavirus 2 by RT PCR NEGATIVE NEGATIVE Final    Comment: (NOTE) SARS-CoV-2 target nucleic acids are NOT DETECTED.  The SARS-CoV-2 RNA is generally detectable in upper respiratory specimens during the acute phase of infection. The lowest concentration of SARS-CoV-2 viral copies this assay can detect is 138 copies/mL. A negative result does not preclude SARS-Cov-2 infection and should not be used as the sole basis for treatment or other patient management decisions. A negative result may occur with  improper specimen collection/handling, submission of specimen other than nasopharyngeal swab, presence of viral mutation(s) within the areas targeted by this assay, and inadequate number of viral copies(<138 copies/mL). A negative result must be combined with clinical observations, patient history, and epidemiological information.  The expected result is Negative.  Fact Sheet for Patients:  EntrepreneurPulse.com.au  Fact Sheet for Healthcare Providers:   IncredibleEmployment.be  This test is no t yet approved or cleared by the [place] FDA and  has been authorized for detection and/or diagnosis of SARS-CoV-2 by FDA under an Emergency Use Authorization (EUA). This EUA will remain  in effect (meaning this test can be used) for the duration of the COVID-19 declaration under Section 564(b)(1) of the Act, 21 U.S.C.section 360bbb-3(b)(1), unless the authorization is terminated  or revoked sooner.       Influenza A by PCR NEGATIVE NEGATIVE Final   Influenza B by PCR NEGATIVE NEGATIVE Final    Comment: (NOTE) The Xpert Xpress SARS-CoV-2/FLU/RSV plus assay is intended as an aid in the diagnosis of influenza from Nasopharyngeal swab specimens and should not be used as a sole basis for treatment. Nasal washings and aspirates are unacceptable for Xpert Xpress SARS-CoV-2/FLU/RSV testing.  Fact Sheet for Patients: EntrepreneurPulse.com.au  Fact Sheet for Healthcare Providers: IncredibleEmployment.be  This test is not yet approved or cleared by the [place] FDA and has been authorized for detection and/or diagnosis of SARS-CoV-2 by FDA under an Emergency Use Authorization (EUA). This EUA will remain in effect (meaning this test can be used) for the duration of the COVID-19 declaration under Section 564(b)(1) of the Act, 21 U.S.C. section 360bbb-3(b)(1), unless the authorization is terminated or revoked.  Performed at [facility], [address], [place], [place] [address]      Radiology Studies: [facility] Chest [facility] 1 View  Result Date: 01/08/2021 CLINICAL DATA:  Shortness of breath EXAM: PORTABLE CHEST 1 VIEW COMPARISON:  Previous studies including the examination of 01/04/2021 FINDINGS: Cardiac size is within normal limits. Small patchy infiltrate is seen in the right upper lung fields with interval worsening. There are no signs of alveolar pulmonary edema. Low position of  diaphragms suggests COPD. There is no pleural effusion or pneumothorax. IMPRESSION: Small patchy infiltrate is seen in the right upper lung fields with interval worsening suggesting pneumonia. Follow-up studies until complete clearing occurs should be considered to rule out any underlying neoplastic process. Electronically Signed   By: Elmer Picker M.D.   On: 01/08/2021 13:57      LOS: 6 days   Barton Dubois M.D on 01/10/2021 at 12:43 PM  Go to [email or web address] - for contact info  [facility] - Office  [phone]  If 7PM-7AM, please contact night-coverage [email or web address] Password [facility] 01/10/2021, 12:43 PM

## 2021-01-10 NOTE — TOC Progression Note (Signed)
Transition of Care [facility]) - Progression Note    Patient Details  Name: Katie Bryant MRN: [id number] Date of Birth: Dec 28, 1928  Transition of Care [facility]) CM/SW [place], [place] Phone Number: 01/10/2021, 12:06 PM  Clinical Narrative:    TOC notified that pts family has elected to transition pt to full comfort care. The family has requested referral be placed to [facility] placement. CSW gave referral to [place] [facility] who states they will have the MD review pts chart. CSW updated that there are no beds at [facility] currently. CSW awaiting follow up from Hospice.    Expected Discharge Plan: [place] Barriers to Discharge: Continued Medical Work up  Expected Discharge Plan and Services Expected Discharge Plan: [place] Choice: [place] arrangements for the past 2 months: Single Family Home                                       Social Determinants of Health (SDOH) Interventions    Readmission Risk Interventions No flowsheet data found.

## 2021-01-10 NOTE — TOC Progression Note (Signed)
Transition of Care [facility]) - Progression Note    Patient Details  Name: Katie Bryant MRN: [id number] Date of Birth: 11/09/1928  Transition of Care [facility]) CM/SW Contact  Shade Flood, LCSW Phone Number: 01/10/2021, 5:16 PM  Clinical Narrative:     Received update from [place] [facility] that pt is approved for GIP. Notified MD that pt's chart can be flipped to GIP status. TOC will follow and assist as needed.  Expected Discharge Plan: [place] Barriers to Discharge: Continued Medical Work up  Expected Discharge Plan and Services Expected Discharge Plan: [place] Choice: [place] arrangements for the past 2 months: Single Family Home Expected Discharge Date: 01/10/21                                     Social Determinants of Health (SDOH) Interventions    Readmission Risk Interventions No flowsheet data found.

## 2021-01-11 MED ORDER — HALOPERIDOL 0.5 MG PO TABS: 0.5000 mg | ORAL_TABLET | ORAL | Status: AC | PRN

## 2021-01-11 MED ORDER — ACETAMINOPHEN 325 MG PO TABS: 650.0000 mg | ORAL_TABLET | Freq: Four times a day (QID) | ORAL | Status: AC | PRN

## 2021-01-11 MED ORDER — ACETAMINOPHEN 650 MG RE SUPP: 650.0000 mg | Freq: Four times a day (QID) | RECTAL | Status: AC | PRN

## 2021-01-11 MED ORDER — LORAZEPAM 2 MG/ML IJ SOLN
0.5000 mg | INTRAMUSCULAR | Status: AC | PRN
Start: 2021-01-11 — End: ?

## 2021-01-11 MED ORDER — MORPHINE SULFATE (CONCENTRATE) 10 MG/0.5ML PO SOLN: 2.5000 mg | ORAL | Status: AC | PRN

## 2021-01-11 MED ORDER — POLYVINYL ALCOHOL 1.4 % OP SOLN: 1.0000 [drp] | Freq: Four times a day (QID) | OPHTHALMIC | Status: AC | PRN

## 2021-01-11 MED ORDER — IPRATROPIUM-ALBUTEROL 0.5-2.5 (3) MG/3ML IN SOLN: 3.0000 mL | RESPIRATORY_TRACT | Status: AC | PRN

## 2021-01-11 MED ORDER — BIOTENE DRY MOUTH MT LIQD: 15.0000 mL | OROMUCOSAL | Status: AC | PRN

## 2021-01-11 MED ORDER — GLYCOPYRROLATE 1 MG PO TABS: 1.0000 mg | ORAL_TABLET | ORAL | Status: AC | PRN

## 2021-01-11 MED ORDER — ONDANSETRON 4 MG PO TBDP: 4.0000 mg | ORAL_TABLET | Freq: Four times a day (QID) | ORAL | 0 refills | Status: AC | PRN

## 2021-01-11 NOTE — Progress Notes (Signed)
Re-positioned patient in bed every two hrs. Attempted to turn side to side but patient would turn back to right side. Pillows placed in between bony prominences.

## 2021-01-11 NOTE — TOC Transition Note (Signed)
Transition of Care [facility]) - CM/SW Discharge Note   Patient Details  Name: Katie Bryant MRN: [id number] Date of Birth: April 18, 1928  Transition of Care [facility]) CM/SW Contact:  Ihor Gully, LCSW Phone Number: 01/11/2021, 11:23 AM   Clinical Narrative:    D/c clinicals sent to facility. EMS contacted. Nurse to call report.    Final next level of care: [place] Barriers to Discharge: No Barriers Identified   Patient Goals and CMS Choice        Discharge Placement              Patient chooses bed at: Other - please specify in the comment section below: [facility]) Patient to be transferred to facility by: [facility] Name of family member notified: Message left for daughter regarding d/c and requesting return contact Patient and family notified of of transfer: 01/11/21  Discharge Plan and Services                                     Social Determinants of Health (SDOH) Interventions     Readmission Risk Interventions No flowsheet data found.

## 2021-01-11 NOTE — Discharge Summary (Signed)
Physician Discharge Summary  Katie Bryant PJK:[id number] DOB: 01-08-1928 DOA: 01/10/2021  PCP: Dettinger, Fransisca Kaufmann, MD  Admit date: 01/10/2021 Discharge date: 01/11/2021  Time spent: 30 minutes  Recommendations for Outpatient Follow-up:  Symptomatic management, comfort care and end-of-life treatment.  Discharge Diagnoses:  Principal Problem:   Hospice care Active Problems:   HTN (hypertension)   Alzheimer's disease ([place])   Lewy body dementia ([place])   Oxygen dependent   Acute on chronic respiratory failure with hypoxia ([place])   Discharge Condition: Hemodynamically stable.  In no major distress.  CODE STATUS: DNR/DNI  Diet recommendation: Dysphagia 1 diet with nectar thick liquids; goal for feeding.  History of present illness:  As per H&P written by Dr. Nehemiah Settle on 01/04/2021 Katie Bryant is a 88 y.o. female with a history of Alzheimer's dementia, hypothyroidism, COPD with chronic respiratory failure on 4 L nasal cannula, coronary artery disease, GERD, hypertension.  Due to her dementia, history is obtained by the patient's daughter and by the ER physician.  Patient has been having cough, shortness of breath, fevers, chills over the past 4 to 5 days.  She was seen by her primary care doctor yesterday and was diagnosed with bronchitis.  She was started on steroids and doxycycline.  She got 1 dose of that and her symptoms have been continued to worsen and the patient was brought here by EMS.  In route, the patient got DuoNeb plus Solu-Medrol. She has been having to increase her nebulizer treatment.  Patient has been accepted for GIP/hospice care at [facility].  Will transfer for further symptomatic management and end-of-life care.    Hospital Course:  1)Acute metabolic encephalopathy -In the setting of hypoxemic respiratory failure, aspiration pneumonia and electrolytes inbalances -Continue supportive care -Constant reorientation provided. -Given underlying history of cognitive  impairment high risk for sundowning and hospital-acquired delirium. -Will focus on full comfort care and symptomatic management only. -Life expectancy less than 2 weeks.   2)acute on chronic  hypoxic respiratory failure secondary to COPD exacerbation and aspiration pneumonia, radiologist recommends repeat CT in 1 month to rule out underlying malignancy. -Patient treated with IV Rocephin, Zithromax, mucolytic's and bronchodilators; was receiving the steroids and adequately improving.  She had an episode of what appears to be aspiration pneumonia with subsequent decline in her respiratory status; treated with IV Unasyn, bronchodilators and oxygen supplementation. -Condition continued to further decline; the speech therapy has recommended dysphagia 1 with nectar thick liquids. -Further discussion with family triggers involvement of palliative care with decision to transition to full comfort and symptomatic management only. -Life expectancy days to weeks only. -Continue symptomatic management. -No further antibiotics will be provided; no re-hospitalization anticipated.   3)Symptomatic Hyponatremia--Na up to 136 from 123 suspect dehydration related, compounded by medications like HCTZ and Celexa, and trazodone -Offending agent has been discontinued; patient received fluid resuscitation with a stabilization of sodium level. -At this moment plan is to transition to full comfort care and no further blood draws or electrolyte checks are anticipated.   4)Dementia with behavioral disturbance- -Patient care has been transition to full comfort -Will use lorazepam and Haldol as needed for behavioral disturbances and agitation.   5)HTN- -Currently not tolerating oral meds, eating or drinking. -Transitioning to full comfort care -Stopping chronic medications.   6)Hypothyroidism-- -Synthroid has been discontinue in the setting of comfort care management.   7)Social/Ethics--- discussed with patient's  daughter, patient is a DNR/DNI -Palliative care consultation appreciated; patient now full comfort and symptomatic management only.  8)Generalized weakness and ambulatory dysfunction---  -At baseline she is usually relatively independent with a walker -Prior to getting sick and being admitted to the hospital she could perform most ADLs typically, occasionally requiring some help-- -PT eval appreciated, recommends SNF rehab patient is now awaiting SNF bed and insurance approval for transfer to SNF rehab. -Appreciate assistance and cooperation by palliative care; decision has been made to transition to full comfort car and discharge to hospice home.   9)Dysphagia--- speech pathologist eval appreciated recommends, -Dysphagia 1 (Puree);Nectar-thick liquid  -Continue comfort feeding.   Procedures: See below for x-ray reports.  Consultations: Palliative care Hospice  Discharge Exam: Vitals:   01/10/21 2113  BP: (!) 165/87  Pulse: (!) 119  Resp: 19  Temp: (!) 96.4 F (35.8 C)   General exam: No eating or drinking; calm and in no major distress currently.  Remains afebrile.  Hemodynamically stable for transfer. Respiratory system: Good saturation on 4 L nasal cannula supplementation; no using accessory muscle.  Positive rhonchi. Cardiovascular system: No rubs, no gallops, no JVD; irregular rate and rhythm. Gastrointestinal system: Abdomen is nondistended, soft and nontender. No organomegaly or masses felt. Normal bowel sounds heard. Central nervous system: No focal deficits.  Moving 4 limbs spontaneously. Extremities: No cyanosis, clubbing or edema. Skin: No petechiae. Psychiatry: Unable to properly assess; but appears impaired secondary to underlying dementia and acute encephalopathy process.   Discharge Instructions   Discharge Instructions     Discharge instructions   Complete by: As directed    Comfort care and symptomatic management. Life expectancy < 2 weeks.       Allergies as of 01/11/2021   No Known Allergies      Medication List     STOP taking these medications    ARIPiprazole 2 MG tablet Commonly known as: ABILIFY   aspirin 81 MG tablet   atorvastatin 40 MG tablet Commonly known as: LIPITOR   betaxolol 0.25 % ophthalmic suspension Commonly known as: BETOPTIC-S   brimonidine 0.2 % ophthalmic solution Commonly known as: ALPHAGAN   budesonide 0.5 MG/2ML nebulizer solution Commonly known as: PULMICORT   buPROPion 200 MG 12 hr tablet Commonly known as: WELLBUTRIN SR   chlorpheniramine 4 MG tablet Commonly known as: CHLOR-TRIMETON   citalopram 20 MG tablet Commonly known as: CELEXA   dextromethorphan 30 MG/5ML liquid Commonly known as: DELSYM   dextromethorphan-guaiFENesin 30-600 MG 12hr tablet Commonly known as: MUCINEX DM   fenofibrate micronized 134 MG capsule Commonly known as: LOFIBRA   Flutter Devi   ketorolac 0.5 % ophthalmic solution Commonly known as: ACULAR   levothyroxine 50 MCG tablet Commonly known as: SYNTHROID   meclizine 25 MG tablet Commonly known as: ANTIVERT   memantine 28 MG Cp24 24 hr capsule Commonly known as: NAMENDA XR   metoprolol tartrate 25 MG tablet Commonly known as: LOPRESSOR   montelukast 10 MG tablet Commonly known as: Airline pilot Adult Misc   omeprazole 20 MG capsule Commonly known as: PRILOSEC   predniSONE 20 MG tablet Commonly known as: DELTASONE   ProAir HFA 108 (90 Base) MCG/ACT inhaler Generic drug: albuterol   Rhopressa 0.02 % Soln Generic drug: Netarsudil Dimesylate   sodium chloride 0.65 % Soln nasal spray Commonly known as: OCEAN   traZODone 50 MG tablet Commonly known as: DESYREL   valsartan-hydrochlorothiazide 80-12.5 MG tablet Commonly known as: DIOVAN-HCT   Vitamin D 50 MCG (2000 UT) tablet       TAKE these medications    acetaminophen  325 MG tablet Commonly known as: TYLENOL Take 2 tablets (650 mg total) by mouth every  6 (six) hours as needed for mild pain (or Fever >/= 101). What changed:  medication strength how much to take reasons to take this   acetaminophen 650 MG suppository Commonly known as: TYLENOL Place 1 suppository (650 mg total) rectally every 6 (six) hours as needed for mild pain (or Fever >/= 101). What changed: You were already taking a medication with the same name, and this prescription was added. Make sure you understand how and when to take each.   antiseptic oral rinse Liqd Apply 15 mLs topically as needed for dry mouth.   glycopyrrolate 1 MG tablet Commonly known as: ROBINUL Take 1 tablet (1 mg total) by mouth every 4 (four) hours as needed (excessive secretions).   haloperidol 0.5 MG tablet Commonly known as: HALDOL Take 1 tablet (0.5 mg total) by mouth every 4 (four) hours as needed for agitation (or delirium).   ipratropium-albuterol 0.5-2.5 (3) MG/3ML Soln Commonly known as: DUONEB Take 3 mLs by nebulization every 4 (four) hours as needed. What changed: See the new instructions.   LORazepam 2 MG/ML injection Commonly known as: ATIVAN Inject 0.25-0.5 mLs (0.5-1 mg total) into the vein every 4 (four) hours as needed for anxiety or sedation (End-of-life care).   morphine CONCENTRATE 10 MG/0.5ML Soln concentrated solution Take 0.13-0.25 mLs (2.6-5 mg total) by mouth every 2 (two) hours as needed for moderate pain (Increased RR/WOB/EOL care).   ondansetron 4 MG disintegrating tablet Commonly known as: ZOFRAN-ODT Take 1 tablet (4 mg total) by mouth every 6 (six) hours as needed for nausea.   OXYGEN Oxygen 4l pm 24/7   polyvinyl alcohol 1.4 % ophthalmic solution Commonly known as: LIQUIFILM TEARS Place 1 drop into both eyes 4 (four) times daily as needed for dry eyes.       No Known Allergies   The results of significant diagnostics from this hospitalization (including imaging, microbiology, ancillary and laboratory) are listed below for reference.     Significant Diagnostic Studies: CT Head Wo Contrast  Result Date: 01/04/2021 CLINICAL DATA:  TIA EXAM: CT HEAD WITHOUT CONTRAST TECHNIQUE: Contiguous axial images were obtained from the base of the skull through the vertex without intravenous contrast. COMPARISON:  03/15/2012 FINDINGS: Brain: No acute intracranial findings are seen. There are no signs of bleeding. Ventricles are not dilated. There is no shift of midline structures. Cortical sulci are prominent. There is decreased density in the periventricular white matter. Vascular: There are scattered arterial calcifications. Skull: Unremarkable. Sinuses/Orbits: Unremarkable. Other: None IMPRESSION: No acute intracranial findings are seen in the noncontrast CT brain. Atrophy. Small-vessel disease. Electronically Signed   By: Elmer Picker M.D.   On: 01/04/2021 12:32   CT Chest Wo Contrast  Result Date: 01/04/2021 CLINICAL DATA:  Weakness and shortness of breath EXAM: CT CHEST WITHOUT CONTRAST TECHNIQUE: Multidetector CT imaging of the chest was performed following the standard protocol without IV contrast. COMPARISON:  None. FINDINGS: Cardiovascular: Normal heart size. No pericardial effusion. Three-vessel coronary artery calcifications. Atherosclerotic disease of the thoracic aorta. Mediastinum/Nodes: No pathologically enlarged lymph nodes seen in the chest. Esophagus is unremarkable. Lungs/Pleura: Central airways are patent. Centrilobular emphysema. Irregular nodular opacity of the right upper lobe measuring 2.1 x 1.0 cm on series 4, image 43 with adjacent linear opacities. Linear opacity of the left upper lobe, likely due to scarring. Upper Abdomen: No acute abnormality. Musculoskeletal: No chest wall mass or suspicious bone lesions identified.  IMPRESSION: 1. Nodular linear consolidation of the right upper lobe, possibly infectious or inflammatory, although neoplasm could also have this appearance. Recommend follow-up chest CT in 1 month to  ensure resolution. If finding does not resolve, consultation with Pulmonology or Thoracic Surgery recommended. 2. Coronary artery calcifications. 3. Aortic Atherosclerosis (ICD10-I70.0) and Emphysema (ICD10-J43.9). Electronically Signed   By: Yetta Glassman M.D.   On: 01/04/2021 12:50   DG Chest Port 1 View  Result Date: 01/08/2021 CLINICAL DATA:  Shortness of breath EXAM: PORTABLE CHEST 1 VIEW COMPARISON:  Previous studies including the examination of 01/04/2021 FINDINGS: Cardiac size is within normal limits. Small patchy infiltrate is seen in the right upper lung fields with interval worsening. There are no signs of alveolar pulmonary edema. Low position of diaphragms suggests COPD. There is no pleural effusion or pneumothorax. IMPRESSION: Small patchy infiltrate is seen in the right upper lung fields with interval worsening suggesting pneumonia. Follow-up studies until complete clearing occurs should be considered to rule out any underlying neoplastic process. Electronically Signed   By: Elmer Picker M.D.   On: 01/08/2021 13:57   DG Chest Portable 1 View  Result Date: 01/04/2021 CLINICAL DATA:  Shortness of breath EXAM: PORTABLE CHEST 1 VIEW COMPARISON:  01/01/2018 FINDINGS: The heart size and mediastinal contours are within normal limits. Background COPD with chronic interstitial changes. New small irregular opacity is present in the upper right lung. No pleural effusion or pneumothorax. The visualized skeletal structures are unremarkable. IMPRESSION: Small irregular opacity in the upper right lung. Chest CT recommended to exclude malignancy. Electronically Signed   By: Macy Mis M.D.   On: 01/04/2021 09:58    Microbiology: Recent Results (from the past 240 hour(s))  Blood culture (routine x 2)     Status: None   Collection Time: 01/04/21  9:55 AM   Specimen: Right Antecubital; Blood  Result Value Ref Range Status   Specimen Description RIGHT ANTECUBITAL  Final   Special Requests    Final    BOTTLES DRAWN AEROBIC AND ANAEROBIC Blood Culture adequate volume   Culture   Final    NO GROWTH 5 DAYS Performed at [facility], [address] [place], [place] [address]    Report Status 01/09/2021 FINAL  Final  Blood culture (routine x 2)     Status: None   Collection Time: 01/04/21 10:29 AM   Specimen: BLOOD RIGHT FOREARM  Result Value Ref Range Status   Specimen Description BLOOD RIGHT FOREARM  Final   Special Requests   Final    BOTTLES DRAWN AEROBIC AND ANAEROBIC Blood Culture adequate volume   Culture   Final    NO GROWTH 5 DAYS Performed at [facility] [address], [place], [place] [address]    Report Status 01/09/2021 FINAL  Final  Resp Panel by RT-PCR (Flu A&B, Covid) Urine, Clean Catch     Status: None   Collection Time: 01/04/21 12:04 PM   Specimen: Urine, Clean Catch; Nasopharyngeal(NP) swabs in vial transport medium  Result Value Ref Range Status   SARS Coronavirus 2 by RT PCR NEGATIVE NEGATIVE Final    Comment: (NOTE) SARS-CoV-2 target nucleic acids are NOT DETECTED.  The SARS-CoV-2 RNA is generally detectable in upper respiratory specimens during the acute phase of infection. The lowest concentration of SARS-CoV-2 viral copies this assay can detect is 138 copies/mL. A negative result does not preclude SARS-Cov-2 infection and should not be used as the sole basis for treatment or other patient management decisions. A negative  result may occur with  improper specimen collection/handling, submission of specimen other than nasopharyngeal swab, presence of viral mutation(s) within the areas targeted by this assay, and inadequate number of viral copies(<138 copies/mL). A negative result must be combined with clinical observations, patient history, and epidemiological information. The expected result is Negative.  Fact Sheet for Patients:  EntrepreneurPulse.com.au  Fact Sheet for Healthcare Providers:   IncredibleEmployment.be  This test is no t yet approved or cleared by the [place] FDA and  has been authorized for detection and/or diagnosis of SARS-CoV-2 by FDA under an Emergency Use Authorization (EUA). This EUA will remain  in effect (meaning this test can be used) for the duration of the COVID-19 declaration under Section 564(b)(1) of the Act, 21 U.S.C.section 360bbb-3(b)(1), unless the authorization is terminated  or revoked sooner.       Influenza A by PCR NEGATIVE NEGATIVE Final   Influenza B by PCR NEGATIVE NEGATIVE Final    Comment: (NOTE) The Xpert Xpress SARS-CoV-2/FLU/RSV plus assay is intended as an aid in the diagnosis of influenza from Nasopharyngeal swab specimens and should not be used as a sole basis for treatment. Nasal washings and aspirates are unacceptable for Xpert Xpress SARS-CoV-2/FLU/RSV testing.  Fact Sheet for Patients: EntrepreneurPulse.com.au  Fact Sheet for Healthcare Providers: IncredibleEmployment.be  This test is not yet approved or cleared by the [place] FDA and has been authorized for detection and/or diagnosis of SARS-CoV-2 by FDA under an Emergency Use Authorization (EUA). This EUA will remain in effect (meaning this test can be used) for the duration of the COVID-19 declaration under Section 564(b)(1) of the Act, 21 U.S.C. section 360bbb-3(b)(1), unless the authorization is terminated or revoked.  Performed at [facility], [address] [place], [place] [address]   MRSA Next Gen by PCR, Nasal     Status: None   Collection Time: 01/04/21  7:50 PM   Specimen: Nasal Mucosa; Nasal Swab  Result Value Ref Range Status   MRSA by PCR Next Gen NOT DETECTED NOT DETECTED Final    Comment: (NOTE) The GeneXpert MRSA Assay (FDA approved for NASAL specimens only), is one component of a comprehensive MRSA colonization surveillance program. It is not intended to diagnose MRSA  infection nor to guide or monitor treatment for MRSA infections. Test performance is not FDA approved in patients less than 47 years old. Performed at [facility], [address] [place], [place] [address]   Expectorated Sputum Assessment w Gram Stain, Rflx to Resp Cult     Status: None   Collection Time: 01/07/21  8:45 AM   Specimen: Sputum  Result Value Ref Range Status   Specimen Description SPUTUM  Final   Special Requests NONE  Final   Sputum evaluation   Final    THIS SPECIMEN IS ACCEPTABLE FOR SPUTUM CULTURE Performed at [facility], [address] [place], [place] [address]    Report Status 01/07/2021 FINAL  Final  Culture, Respiratory w Gram Stain     Status: None   Collection Time: 01/07/21  8:45 AM   Specimen: SPU  Result Value Ref Range Status   Specimen Description   Final    SPUTUM Performed at [facility], [address] [place], [place] [address]    Special Requests   Final    NONE Reflexed from [id number] Performed at [facility], [address] [place], [place] [address]    Gram Stain   Final    RARE SQUAMOUS EPITHELIAL CELLS PRESENT FEW WBC PRESENT,  PREDOMINANTLY PMN RARE GRAM POSITIVE RODS Performed at [facility], [place] [address], [place], [place] [address]    Culture RARE CANDIDA ALBICANS  Final   Report Status 01/10/2021 FINAL  Final  Resp Panel by RT-PCR (Flu A&B, Covid) Nasopharyngeal Swab     Status: None   Collection Time: 01/08/21 12:20 PM   Specimen: Nasopharyngeal Swab; Nasopharyngeal(NP) swabs in vial transport medium  Result Value Ref Range Status   SARS Coronavirus 2 by RT PCR NEGATIVE NEGATIVE Final    Comment: (NOTE) SARS-CoV-2 target nucleic acids are NOT DETECTED.  The SARS-CoV-2 RNA is generally detectable in upper respiratory specimens during the acute phase of infection. The lowest concentration of SARS-CoV-2 viral copies this assay can detect is 138 copies/mL. A negative result does not preclude SARS-Cov-2 infection and  should not be used as the sole basis for treatment or other patient management decisions. A negative result may occur with  improper specimen collection/handling, submission of specimen other than nasopharyngeal swab, presence of viral mutation(s) within the areas targeted by this assay, and inadequate number of viral copies(<138 copies/mL). A negative result must be combined with clinical observations, patient history, and epidemiological information. The expected result is Negative.  Fact Sheet for Patients:  EntrepreneurPulse.com.au  Fact Sheet for Healthcare Providers:  IncredibleEmployment.be  This test is no t yet approved or cleared by the [place] FDA and  has been authorized for detection and/or diagnosis of SARS-CoV-2 by FDA under an Emergency Use Authorization (EUA). This EUA will remain  in effect (meaning this test can be used) for the duration of the COVID-19 declaration under Section 564(b)(1) of the Act, 21 U.S.C.section 360bbb-3(b)(1), unless the authorization is terminated  or revoked sooner.       Influenza A by PCR NEGATIVE NEGATIVE Final   Influenza B by PCR NEGATIVE NEGATIVE Final    Comment: (NOTE) The Xpert Xpress SARS-CoV-2/FLU/RSV plus assay is intended as an aid in the diagnosis of influenza from Nasopharyngeal swab specimens and should not be used as a sole basis for treatment. Nasal washings and aspirates are unacceptable for Xpert Xpress SARS-CoV-2/FLU/RSV testing.  Fact Sheet for Patients: EntrepreneurPulse.com.au  Fact Sheet for Healthcare Providers: IncredibleEmployment.be  This test is not yet approved or cleared by the [place] FDA and has been authorized for detection and/or diagnosis of SARS-CoV-2 by FDA under an Emergency Use Authorization (EUA). This EUA will remain in effect (meaning this test can be used) for the duration of the COVID-19 declaration  under Section 564(b)(1) of the Act, 21 U.S.C. section 360bbb-3(b)(1), unless the authorization is terminated or revoked.  Performed at [facility], [address], [place], [place] [address]      Labs: Basic Metabolic Panel: Recent Labs  Lab 01/05/21 0558 01/06/21 0440 01/07/21 0304 01/08/21 0502 01/09/21 0551  NA 128* 130* 130* 136 137  K 3.8 3.8 3.8 3.4* 3.8  CL 92* 92* 93* 97* 99  CO2 28 30 28 31 30   GLUCOSE 119* 98 112* 108* 101*  BUN 14 18 16 15 12   CREATININE 0.53 0.65 0.57 0.60 0.66  CALCIUM 8.5* 8.3* 8.6* 8.6* 8.7*  PHOS  --  2.2*  --   --  1.7*   Liver Function Tests: Recent Labs  Lab 01/06/21 0440 01/09/21 0551  ALBUMIN 2.9* 3.3*   CBC: Recent Labs  Lab 01/05/21 0558 01/06/21 0440 01/08/21 0502 01/09/21 0551  WBC 9.9 10.7* 11.2* 11.6*  HGB 9.7* 9.2* 9.8* 9.9*  HCT 29.3* 28.7* 31.1*  30.2*  MCV 89.3 90.0 88.9 89.3  PLT 215 211 243 262   BNP (last 3 results) Recent Labs    01/04/21 0955  BNP 205.0*    CBG: Recent Labs  Lab 01/05/21 2352 01/06/21 0644 01/06/21 1114 01/07/21 0004 01/07/21 1134  GLUCAP 114* 84 170* 129* 110*    Signed:  Barton Dubois MD.  [facility] 01/11/2021, 11:31 AM

## 2021-01-13 DIAGNOSIS — J449 Chronic obstructive pulmonary disease, unspecified: Secondary | ICD-10-CM | POA: Diagnosis not present

## 2021-01-15 ENCOUNTER — Ambulatory Visit: Payer: Medicare Other | Admitting: Cardiology

## 2021-02-06 DEATH — deceased

## 2021-02-08 ENCOUNTER — Ambulatory Visit: Payer: Medicare Other | Admitting: Internal Medicine

## 2021-02-13 ENCOUNTER — Ambulatory Visit: Payer: Medicare Other | Admitting: Internal Medicine

## 2022-05-26 NOTE — Telephone Encounter (Signed)
Erroneous encounter will close.
